# Patient Record
Sex: Female | Born: 1945
Health system: Southern US, Community
[De-identification: ages and names within clinical notes are randomized; demographics above are authoritative.]

## PROBLEM LIST (undated history)

## (undated) DIAGNOSIS — K5730 Diverticulosis of large intestine without perforation or abscess without bleeding: Secondary | ICD-10-CM

## (undated) DIAGNOSIS — C4490 Unspecified malignant neoplasm of skin, unspecified: Secondary | ICD-10-CM

## (undated) DIAGNOSIS — N959 Unspecified menopausal and perimenopausal disorder: Secondary | ICD-10-CM

## (undated) DIAGNOSIS — O2690 Pregnancy related conditions, unspecified, unspecified trimester: Secondary | ICD-10-CM

## (undated) DIAGNOSIS — K219 Gastro-esophageal reflux disease without esophagitis: Secondary | ICD-10-CM

## (undated) DIAGNOSIS — L404 Guttate psoriasis: Secondary | ICD-10-CM

## (undated) DIAGNOSIS — S82891D Other fracture of right lower leg, subsequent encounter for closed fracture with routine healing: Secondary | ICD-10-CM

## (undated) DIAGNOSIS — I10 Essential (primary) hypertension: Secondary | ICD-10-CM

## (undated) DIAGNOSIS — K624 Stenosis of anus and rectum: Secondary | ICD-10-CM

## (undated) DIAGNOSIS — E785 Hyperlipidemia, unspecified: Secondary | ICD-10-CM

## (undated) HISTORY — DX: Guttate psoriasis: L40.4

## (undated) HISTORY — DX: Stenosis of anus and rectum: K62.4

## (undated) HISTORY — DX: Hyperlipidemia, unspecified: E78.5

## (undated) HISTORY — DX: Diverticulosis of large intestine without perforation or abscess without bleeding: K57.30

## (undated) HISTORY — DX: Essential (primary) hypertension: I10

## (undated) HISTORY — DX: Pregnancy related conditions, unspecified, unspecified trimester: O26.90

## (undated) HISTORY — PX: RECTOCELE REPAIR: SHX761

## (undated) HISTORY — PX: HERNIA REPAIR: SHX51

## (undated) HISTORY — DX: Unspecified malignant neoplasm of skin, unspecified: C44.90

## (undated) HISTORY — DX: Unspecified menopausal and perimenopausal disorder: N95.9

## (undated) HISTORY — PX: ABDOMINAL HYSTERECTOMY: SHX81

## (undated) HISTORY — PX: CYSTOCELE REPAIR: SHX163

---
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
## 1967-01-04 HISTORY — PX: SEPTOPLASTY: SUR1290

## 1972-01-04 HISTORY — PX: VARICOSE VEIN SURGERY: SHX832

## 1989-01-03 HISTORY — PX: BLADDER REPAIR: SHX76

## 1999-12-13 ENCOUNTER — Other Ambulatory Visit: Admission: RE | Admit: 1999-12-13 | Discharge: 1999-12-13 | Payer: Self-pay | Admitting: Internal Medicine

## 2003-01-04 LAB — HM COLONOSCOPY: HM Colonoscopy: NORMAL

## 2003-05-12 ENCOUNTER — Ambulatory Visit (HOSPITAL_COMMUNITY): Admission: RE | Admit: 2003-05-12 | Discharge: 2003-05-12 | Payer: Self-pay | Admitting: Gastroenterology

## 2003-05-12 ENCOUNTER — Encounter (INDEPENDENT_AMBULATORY_CARE_PROVIDER_SITE_OTHER): Payer: Self-pay | Admitting: *Deleted

## 2004-06-22 ENCOUNTER — Ambulatory Visit: Payer: Self-pay | Admitting: Internal Medicine

## 2005-07-05 ENCOUNTER — Ambulatory Visit: Payer: Self-pay | Admitting: Internal Medicine

## 2006-07-11 ENCOUNTER — Ambulatory Visit: Payer: Self-pay | Admitting: Internal Medicine

## 2006-08-14 ENCOUNTER — Ambulatory Visit: Payer: Self-pay | Admitting: Gynecology

## 2007-10-18 ENCOUNTER — Encounter: Admission: RE | Admit: 2007-10-18 | Discharge: 2007-10-18 | Payer: Self-pay | Admitting: Internal Medicine

## 2007-12-04 ENCOUNTER — Ambulatory Visit: Payer: Self-pay | Admitting: Internal Medicine

## 2008-09-29 ENCOUNTER — Ambulatory Visit: Payer: Self-pay | Admitting: Internal Medicine

## 2009-01-08 ENCOUNTER — Ambulatory Visit: Payer: Self-pay | Admitting: Internal Medicine

## 2009-05-31 ENCOUNTER — Emergency Department: Payer: Self-pay | Admitting: Emergency Medicine

## 2009-10-12 ENCOUNTER — Ambulatory Visit: Payer: Self-pay | Admitting: Obstetrics and Gynecology

## 2009-10-15 ENCOUNTER — Ambulatory Visit (HOSPITAL_COMMUNITY): Admission: RE | Admit: 2009-10-15 | Discharge: 2009-10-15 | Payer: Self-pay | Admitting: Obstetrics & Gynecology

## 2009-11-05 ENCOUNTER — Ambulatory Visit: Payer: Self-pay | Admitting: Obstetrics & Gynecology

## 2010-01-14 ENCOUNTER — Ambulatory Visit: Payer: Self-pay | Admitting: Internal Medicine

## 2010-05-18 NOTE — Assessment & Plan Note (Signed)
Emily Ryan, Emily Ryan              ACCOUNT NO.:  [id number]   MEDICAL RECORD NO.:  [id number]          PATIENT TYPE:  POB   LOCATION:  [facility] at [facility]         FACILITY:  [facility]   PHYSICIAN:  Catalina Antigua, MD     DATE OF BIRTH:  12-11-1945   DATE OF SERVICE:  10/12/2009                                  CLINIC NOTE   HISTORY:  This is a 65 year old who presents today as a referral for  [facility] for evaluation of questionable swelling  around ovary.  The patient presents today without any complaints.  She  denies any pelvic pain and pressure, abnormal bleeding or discharge.  The patient is status post hysterectomy in 2002 secondary to uterine  prolapse and has been asymptomatic since.  The patient presents today as  mentioned before as a referral for evaluation of questionable pelvic  fullness appreciated on physical exam.   PAST MEDICAL HISTORY:  Significant for hypertension and hyperlipidemia.   PAST SURGICAL HISTORY:  She has had a hysterectomy, rectocele, and  cystocele repair in 2002 and 2000 respectively.  She had a hernia repair  in 1956 and tonsillectomy in 1953.   PHYSICAL EXAMINATION:  VITAL SIGNS:  Blood pressure 120/73, pulse of 81,  weight of 144 pounds, height of 4 feet 11 inches.  ABDOMEN:  Soft, nontender, and nondistended.  PELVIC:  She had a normal-appearing vaginal mucosa.  Vaginal cuff  intact.  No abnormal bleeding or discharge.  On bimanual exam, again  vaginal cuff was palpated to be intact and no adnexal or pelvic masses  were appreciated nor tenderness elicited.   ASSESSMENT AND PLAN:  This is a 65 year old postmenopausal who presents  today for evaluation of pelvic fullness appreciated on previous physical  exam.  Ultrasound was ordered for the patient reassurance.  The patient  will return in 2 weeks for discussion of the results.  Of note, her  family history is significant for coronary artery disease.  Breast and  ovarian cancer  in maternal and paternal aunts diagnosed over the age of  16.           ______________________________  Catalina Antigua, MD     PC/MEDQ  D:  10/12/2009  T:  10/13/2009  Job:  [id number]

## 2010-05-18 NOTE — Assessment & Plan Note (Signed)
Emily Ryan, Emily Ryan              ACCOUNT NO.:  [id number]   MEDICAL RECORD NO.:  [id number]          PATIENT TYPE:  POB   LOCATION:  [facility]         FACILITY:  [facility]   PHYSICIAN:  Elsie Lincoln, MD      DATE OF BIRTH:  1945/10/25   DATE OF SERVICE:  11/05/2009                                  CLINIC NOTE   The patient is a 65 year old female who presents for results of an  ultrasound.  The patient's primary care physician thought that she felt  ovarian fullness in the right adnexa.  Transvaginal ultrasound reveals a  right ovary with normal appearance of 1.4 x 0.7 x 1.3 cm.  The left  ovary is not visual.  The patient has had a hysterectomy for prolapse.  The patient is not having any complaints of fullness.  She does still  have urinary incontinence, but she does not wish anything to be done.  All primary care followup is done by her primary care physician,  followup mammogram.  The patient does not get Pap smears, but she states  she has never had abnormal Pap smear and she does have a hysterectomy.  The patient follows with yearly exams or she can followup with her  primary care physician.           ______________________________  Elsie Lincoln, MD     KL/MEDQ  D:  11/05/2009  T:  11/06/2009  Job:  [id number]

## 2010-05-21 NOTE — Op Note (Signed)
Emily Ryan, Emily Ryan                          ACCOUNT NO.:  [id number]   MEDICAL RECORD NO.:  [id number]                   PATIENT TYPE:  AMB   LOCATION:  ENDO                                 FACILITY:  [facility]   PHYSICIAN:  Anselmo Rod, M.D.               DATE OF BIRTH:  May 25, 1945   DATE OF PROCEDURE:  05/12/2003  DATE OF DISCHARGE:                                 OPERATIVE REPORT   PROCEDURE PERFORMED:  Colonoscopy with biopsies times two.   ENDOSCOPIST:  Charna Elizabeth, M.D.   INSTRUMENT USED:  Olympus video colonoscope.   INDICATIONS FOR PROCEDURE:  The patient is a 65 year old white female  undergoing screening colonoscopy.  The patient has had some rectal bleeding.  Rule out colonic polyps, masses, hemorrhoids, etc.   PREPROCEDURE PREPARATION:  Informed consent was procured from the patient.  The patient was fasted for eight hours prior to the procedure and prepped  with a bottle of magnesium citrate and a gallon of GoLYTELY the night prior  to the procedure.   PREPROCEDURE PHYSICAL:  The patient had stable vital signs.  Neck supple.  Chest clear to auscultation.  S1 and S2 regular.  Abdomen soft with normal  bowel sounds.   DESCRIPTION OF PROCEDURE:  The patient was placed in left lateral decubitus  position and sedated with 50 mg of Demerol and 5 mg of Versed intravenously.  Once the patient was adequately sedated and maintained on low flow oxygen  and continuous cardiac monitoring, the Olympus video colonoscope was  advanced from the rectum to the cecum.  The appendicular orifice and  ileocecal valve were clearly visualized and photographed.  A few sigmoid  diverticula were seen.  There were prominent external hemorrhoids, small  polyp was biopsied from the cecal base.  The terminal ileum appeared normal.   IMPRESSION:  1. Prominent external hemorrhoids.  2. Small polyp biopsied from the cecal base.  3. Few sigmoid diverticula.  4. Normal terminal ileum.   RECOMMENDATIONS:  1. Await pathology results.  2. Avoid all nonsteroidals including aspirin for now.  3. Repeat CRC screening depending on pathology results.  4. Outpatient followup in the next two weeks for further recommendations.                                               Anselmo Rod, M.D.    JNM/MEDQ  D:  05/12/2003  T:  05/12/2003  Job:  [id number]   cc:   Olene Craven, M.D.  [address]  [address]  [place]  [place] [address]  Fax: [phone]

## 2010-09-03 ENCOUNTER — Encounter: Payer: Self-pay | Admitting: Internal Medicine

## 2010-09-07 ENCOUNTER — Encounter: Payer: Self-pay | Admitting: Family Medicine

## 2010-09-07 ENCOUNTER — Ambulatory Visit (INDEPENDENT_AMBULATORY_CARE_PROVIDER_SITE_OTHER): Payer: Medicare Other | Admitting: Family Medicine

## 2010-09-07 DIAGNOSIS — N949 Unspecified condition associated with female genital organs and menstrual cycle: Secondary | ICD-10-CM

## 2010-09-07 DIAGNOSIS — E785 Hyperlipidemia, unspecified: Secondary | ICD-10-CM | POA: Insufficient documentation

## 2010-09-07 DIAGNOSIS — R102 Pelvic and perineal pain: Secondary | ICD-10-CM

## 2010-09-07 DIAGNOSIS — I10 Essential (primary) hypertension: Secondary | ICD-10-CM | POA: Insufficient documentation

## 2010-09-07 DIAGNOSIS — K5900 Constipation, unspecified: Secondary | ICD-10-CM

## 2010-09-07 DIAGNOSIS — E1169 Type 2 diabetes mellitus with other specified complication: Secondary | ICD-10-CM | POA: Insufficient documentation

## 2010-09-07 NOTE — Patient Instructions (Signed)
Pelvic Mass A "mass" is a lump that either your caregiver found during an examination or you found before seeing your caregiver. The "pelvis" is the lower portion of the trunk in between the hip bones. There are many possible reasons why a lump has appeared. Testing will help determine the cause and the steps to a solution. CAUSES Before complete testing is done, it may be difficult or impossible for your caregiver to know if the lump is truly in one of the pelvic organs (such as the uterus or ovaries) or is coming from one of many organs that are near the pelvis. Problems in the colon or kidney can also lead to a lump that might seem to be in the pelvis. If testing shows that the mass is in the pelvis, there are still many possible causes:  Tumors and cancers. These problems are relatively common and are the greatest source of worry for patients. Cancerous lumps in the pelvis may be due to cancers that started in the uterus or ovaries or due to cancers that started in other areas and then spread to the pelvis. Many cancers are very treatable when found early.   Non-cancerous tumors and masses. There are a large number of common and uncommon non-cancerous problems that can lead to a mass in the pelvis. Two very common ones are fibroids of the uterus and ovarian cysts. Before testing and/or surgery, it may be impossible to tell the difference between these problems and a cancer.   Infection. Certain types of infections can produce a mass in the pelvis. The infection might be caused by bacteria. If there is an infection treatment might include antibiotics. Masses from infection can also be caused by certain viruses, and in rare cases, by fungi or parasites. If infection is the cause, your caregiver will be able to determine the type of germ responsible for the mass by doing appropriate testing.   Inflammatory bowel disease. These are diseases thought to be caused by a defect in the immune system of the  intestine. There are two inflammatory bowel diseases: Ulcerative Colitis and Crohn's Disease. They are lifelong problems with symptoms that can come and go. Sometimes, patients with these diseases will develop a mass in the lower part of the colon that can make it seem as though there is a mass in the pelvis.   Past Surgery. If there has been pelvic surgery in the past, and there is a lot of scarring that forms during the process of healing, this can eventually fell like a mass when examined by your caregiver. As with the other problems described above, this may or may not be associated with symptoms or feeling badly.   Ectopic Pregnancy. This is a condition where the growing fetus is growing outside of the uterus. This is a common cause for a pelvic mass and may become a serious or life-threatening problem that requires immediate surgery.  SYMPTOMS In people with a pelvic mass, there may be a large variety of associated symptoms including:   No symptoms, other than the appearance of the mass itself.   Cramping, nausea, diarrhea.   Fever, vomiting, weakness.   Pelvic, side, and/or back pain.   Weight loss.   Constipation.   Problems with vaginal bleeding. This can be very variable. Bleeding might be very light or very heavy. Bleeding may be mixed with large clots. Menstruation may be very frequent and may seem to almost completely stop. There may be varying levels of pain with menstruation.  Urinary symptoms including frequent urination, inability to empty the bladder completely, or urinating very small amounts.  DIAGNOSIS Because of the large number of causes of a mass in the pelvis, your caregiver will ask you to undergo testing in order to get a clear diagnosis in a timely manner. The tests might include some or all of the following:  Blood tests such as a blood count, measurement of common minerals in the blood, kidney/liver/pancreas function, pregnancy test, and others.   X-rays.  Plain x-rays and special x-rays may be requested except if you are pregnant.   Ultrasound. This is a test that uses sound waves to "paint a picture" of the mass. The type of "sound picture" that is seen can help to narrow the diagnosis.   CAT scan and MRI imaging. Each can provide additional information as to the different characteristics of the mass and can help to develop a final plan for diagnostics and treatment. If cancer is suspected, these special tests can also help to show any spread of the cancer to other parts of the body. It is possible that these tests may not be ordered if you are pregnant.   Laparoscopy. This is a special exam of the inside of the pelvic area using a slim, flexible, lighted tube. This allows your caregiver to get a direct look at the mass. Sometimes, this allows getting a very small piece of the mass (a biopsy). This piece of tissue can then be examined in a lab that will frequently lead to a clear diagnosis. In some cases, your caregiver can use a laparoscope to completely remove the mass after it has been examined.   Surgery. Sometimes, a diagnosis can only be made by carrying out an operation and obtaining a biopsy (as noted above). Many times, the biopsy is obtained and the mass is removed during the same operation.  These are the most common ways for determining the exact cause of the mass. Your caregiver may recommend other tests that are not listed here. TREATMENT Treatment(s) can only be recommended after a diagnosis is made. Your caregiver will discuss your test results with you, the meanings of the tests, and the recommended steps to begin treatment. He/she will also recommend whether you need to be examined by specialists as you go through the steps of diagnosis and a treatment plan is developed. HOME CARE INSTRUCTIONS  Test preparation. Carefully follow instructions when preparing for certain tests. This may involve many things such as:   Drinking fluids to  fill the bladder before a pelvic ultrasound.   Fasting before certain blood tests.   Drinking special "contrast" fluids that are necessary for obtaining the best CAT scan and MRI images.   Medications. Your caregiver may prescribe medications to help relieve symptoms while you undergo testing. It is important that your current medications (prescription, non-prescription, herbal, vitamins, etc.) be kept in mind when new prescriptions are recommended.   Diet. There may be a need for changes in diet in order to help with symptom relief while testing is being done. If this applies to you, your caregiver will discuss these changes with you.  SEEK MEDICAL CARE IF:  You cannot hold down any of the recommended fluids used to prepare for tests such as CAT scan MRI.   You feel that you are having trouble with any new prescriptions.   You develop new symptoms of pain, vomiting, diarrhea, fever, or other problems that you did not feel since your last exam.  You experience inability to empty your bladder completely or develop painful and/or bloody urination.  SEEK IMMEDIATE MEDICAL CARE IF:  You vomit bright red blood, or a coffee ground appearing material.   You have blood in your stools, or the stools turn black and tarry.   You have an abnormal or increased amount of vaginal bleeding.   An oral temperature above 101 develops.   You develop easy bruising or bleeding.   You develop pain that is not controlled by your medication.   You feel worsening weakness or you have a fainting episode.   You feel that the mass has suddenly gotten larger.   You develop severe bloating in the abdomen and/or pelvis.   You cannot pass any urine.  MAKE SURE YOU:   Understand these instructions.   Will watch your condition.   Will get help right away if you are not doing well or get worse.  Document Released: 03/29/2006 Document Re-Released: 12/03/2007 [facility] Patient Information 2011 [place],  [place].

## 2010-09-07 NOTE — Progress Notes (Signed)
For about one week increased pressure in abdomen and unable to have a bowel movement.  She has had a cystocele and rectocele repair in Apri 2002 along with a hysterectomy approximately one year later.  She has been unable to have a bowel movement even though she has used enema which usually does the trick for her.

## 2010-09-07 NOTE — Progress Notes (Signed)
  Subjective:    Patient ID: Emily Ryan, female    DOB: 10-02-1945, 65 y.o.   MRN: [id number]  Abdominal Pain This is a new problem. The current episode started in the past 7 days. The onset quality is sudden. The problem occurs constantly. The problem has been gradually worsening. The pain is located in the suprapubic region. The quality of the pain is aching and cramping. The abdominal pain does not radiate. Associated symptoms include constipation. Pertinent negatives include no anorexia, fever, nausea, vomiting or weight loss. The pain is aggravated by certain positions and movement. The pain is relieved by being still and recumbency. She has tried nothing for the symptoms. The treatment provided moderate relief. Her past medical history is significant for abdominal surgery.      Review of Systems  Constitutional: Negative for fever and weight loss.  Gastrointestinal: Positive for abdominal pain and constipation. Negative for nausea, vomiting and anorexia.       Objective:   Physical Exam  Constitutional: She appears well-developed and well-nourished.  HENT:  Head: Normocephalic and atraumatic.  Neck: Normal range of motion.  Cardiovascular: Normal rate.   Pulmonary/Chest: Effort normal.  Abdominal: Soft.  Genitourinary:       NEFG, Excellent support, uterus and cervix are absent.  There is a mass felt at the top of the vaginal cuff that is mildly tender.          Assessment & Plan:  Pelvic mass/pain-unclear etiology, assoc. Constipation, failed supp, enema Will check pelvic u/s and try Mag. Citrate.

## 2010-09-08 ENCOUNTER — Encounter: Payer: Self-pay | Admitting: Internal Medicine

## 2010-09-08 ENCOUNTER — Ambulatory Visit (INDEPENDENT_AMBULATORY_CARE_PROVIDER_SITE_OTHER): Payer: Medicare Other | Admitting: Internal Medicine

## 2010-09-08 DIAGNOSIS — K5900 Constipation, unspecified: Secondary | ICD-10-CM

## 2010-09-08 DIAGNOSIS — E785 Hyperlipidemia, unspecified: Secondary | ICD-10-CM

## 2010-09-08 DIAGNOSIS — K2990 Gastroduodenitis, unspecified, without bleeding: Secondary | ICD-10-CM

## 2010-09-08 DIAGNOSIS — R0789 Other chest pain: Secondary | ICD-10-CM

## 2010-09-08 DIAGNOSIS — K2970 Gastritis, unspecified, without bleeding: Secondary | ICD-10-CM

## 2010-09-08 LAB — TSH: TSH: 1.04 u[IU]/mL (ref 0.35–5.50)

## 2010-09-08 LAB — LDL CHOLESTEROL, DIRECT: Direct LDL: 72 mg/dL

## 2010-09-08 NOTE — Progress Notes (Signed)
Subjective:    Patient ID: Emily Ryan, female    DOB: 03-29-1945, 65 y.o.   MRN: [id number]  HPI Presents with new onset abdominal heaviness/pain which started last Friday.   Pain is located in the upper epigastric area and associated with a feeling of fullness.  It is brought on with activity , lasted about an hour,  And resovled with rest.  She recently saw her GYN who ordered pelvic ultrasound for followup on an bnormal pelvic exam.  She drank a bottle of magnesium citrate yesterday thinking it was constipation.  She has not had the pain since yesterday.   Past Medical History  Diagnosis Date  . Hyperlipidemia   . Hypertension   . Menopausal disorder   . Complicated pregnancy     1st pregnancy complicated by post operative hemorrhage and 2ng complicated by epidural  . Diabetes mellitus    Current Outpatient Prescriptions on File Prior to Visit  Medication Sig Dispense Refill  . Calcium Carb-Cholecalciferol (CALCIUM PLUS VITAMIN D) 600-100 MG-UNIT CAPS Take by mouth. Two by mouth daily       . estrogens, conjugated, (PREMARIN) 0.45 MG tablet Take 0.45 mg by mouth daily. Take daily for 21 days then do not take for 7 days.       . hydrochlorothiazide (HYDRODIURIL) 12.5 MG tablet Take 12.5 mg by mouth daily.        Marland Kitchen lisinopril (PRINIVIL,ZESTRIL) 20 MG tablet Take 20 mg by mouth daily.        Marland Kitchen loratadine (CLARITIN) 10 MG tablet Take 10 mg by mouth 2 (two) times daily.        . Multiple Vitamin (MULTIVITAMIN) capsule Take 1 capsule by mouth daily.        . Omega-3 Fatty Acids (FISH OIL) 1000 MG CAPS Take by mouth 2 (two) times daily.        . simvastatin (ZOCOR) 40 MG tablet Take 40 mg by mouth daily.        . vitamin E 400 UNIT capsule Take 400 Units by mouth daily.           Review of Systems  Constitutional: Negative for fever, chills and unexpected weight change.  HENT: Negative for hearing loss, ear pain, nosebleeds, congestion, sore throat, facial swelling, rhinorrhea,  sneezing, mouth sores, trouble swallowing, neck pain, neck stiffness, voice change, postnasal drip, sinus pressure, tinnitus and ear discharge.   Eyes: Negative for pain, discharge, redness and visual disturbance.  Respiratory: Negative for cough, chest tightness, shortness of breath, wheezing and stridor.   Cardiovascular: Negative for chest pain, palpitations and leg swelling.  Gastrointestinal: Positive for abdominal pain and constipation.  Musculoskeletal: Negative for myalgias and arthralgias.  Skin: Negative for color change and rash.  Neurological: Negative for dizziness, weakness, light-headedness and headaches.  Hematological: Negative for adenopathy.    BP 131/77  Pulse 91  Temp(Src) 97.8 F (36.6 C) (Oral)  Resp 14  Ht 4' 11.5" (1.511 m)  Wt 139 lb (63.05 kg)  BMI 27.60 kg/m2  SpO2 96%     Objective:   Physical Exam  Constitutional: She is oriented to person, place, and time. She appears well-developed and well-nourished.  HENT:  Mouth/Throat: Oropharynx is clear and moist.  Eyes: EOM are normal. Pupils are equal, round, and reactive to light. No scleral icterus.  Neck: Normal range of motion. Neck supple. No JVD present. No thyromegaly present.  Cardiovascular: Normal rate, regular rhythm, normal heart sounds and intact distal pulses.   Pulmonary/Chest: Effort  normal and breath sounds normal.  Abdominal: Soft. Bowel sounds are normal. She exhibits no distension and no mass. There is no tenderness. There is no rebound.  Musculoskeletal: Normal range of motion. She exhibits no edema.  Lymphadenopathy:    She has no cervical adenopathy.  Neurological: She is alert and oriented to person, place, and time.  Skin: Skin is warm and dry.  Psychiatric: She has a normal mood and affect.          Assessment & Plan:  Abdominal pain:  Etiology unclear.  At time of visit , pain had not recurred after using a cathartic laxative. However at time of dictation, patient's  husband reported that she had another episode this morning of severe epigastric pain .  Ddx includes gastritis, PUD, biliary colic, pancreatitis (less likely given absence of risk factors) and atypical chest pain (CRFS include hypertension, hyperlipidemia and menopause).

## 2010-09-08 NOTE — Patient Instructions (Signed)
High-Fiber Diet A high-fiber diet changes your normal diet to include more whole grains, legumes, fruits, and vegetables. Changes in the diet involve replacing refined carbohydrates with unrefined foods. The calorie level of the diet is essentially unchanged. The Dietary Reference Intake (recommended amount) for adult males is 38 grams per day. For adult females, it is 25 grams per day. Pregnant and lactating women should consume 28 grams of fiber per day. Fiber is the intact part of a plant that is not broken down during digestion. Functional fiber is fiber that has been isolated from the plant to provide a beneficial effect in the body. PURPOSE  Increase stool bulk.   Ease and regulate bowel movements.   Lower cholesterol.  INDICATIONS THAT YOU NEED MORE FIBER  Constipation and hemorrhoids.   Uncomplicated diverticulosis (intestine condition) and irritable bowel syndrome.   Weight management.   As a protective measure against hardening of the arteries (atherosclerosis), diabetes, and cancer.  NOTE OF CAUTION If you have a digestive or bowel problem, ask your caregiver for advice before adding high-fiber foods to your diet. Some of the following medical problems are such that a high-fiber diet should not be used without consulting your caregiver. DO NOT USE WITH:  Acute diverticulitis (intestine infection).   Partial small bowel obstructions.   Complicated diverticular disease involving bleeding, rupture (perforation), or abscess (boil, furuncle).   Presence of autonomic neuropathy (nerve damage) or gastric paresis (stomach cannot empty itself).  GUIDELINES FOR INCREASING FIBER IN THE DIET  Start adding fiber to the diet slowly. A gradual increase of about 5 more grams (2 slices of whole-wheat bread, 2 servings of most fruits or vegetables, or 1 bowl of high-fiber cereal) per day is best. Too rapid an increase in fiber may result in constipation, flatulence, and bloating.   Drink  enough water and fluids to keep your urine clear or pale yellow. Water, juice, or caffeine-free drinks are recommended. Not drinking enough fluid may cause constipation.   Eat a variety of high-fiber foods rather than one type of fiber.   Try to increase your intake of fiber through using high-fiber foods rather than fiber pills or supplements that contain small amounts of fiber.   The goal is to change the types of food eaten. Do not supplement your present diet with high-fiber foods, but replace foods in your present diet.  INCLUDE A VARIETY OF FIBER SOURCES  Replace refined and processed grains with whole grains, canned fruits with fresh fruits, and incorporate other fiber sources. White rice, white breads, and most bakery goods contain little or no fiber.   Brown whole-grain rice, buckwheat oats, and many fruits and vegetables are all good sources of fiber. These include: broccoli, Brussels sprouts, cabbage, cauliflower, beets, sweet potatoes, white potatoes (skin on), carrots, tomatoes, eggplant, squash, berries, fresh fruits, and dried fruits.   Cereals appear to be the richest source of fiber. Cereal fiber is found in whole grains and bran. Bran is the fiber-rich outer coat of cereal grain, which is largely removed in refining. In whole-grain cereals, the bran remains. In breakfast cereals, the largest amount of fiber is found in those with "bran" in their names. The fiber content is sometimes indicated on the label.   You may need to include additional fruits and vegetables each day.   In baking, for 1 cup white flour, you may use the following substitutions:   1 cup whole-wheat flour minus 2 tablespoons.   1/2 cup white flour plus 1/2 cup   whole-wheat flour.  References: Dietary Reference Intakes: Recommended Intakes for Individuals. National Academy of Sciences. Institute of Medicine. Food and Nutrition Board. Document Released: 12/20/2004 Document Re-Released: 03/16/2009 [employer]  Patient Information 2011 [employer]. 

## 2010-09-09 ENCOUNTER — Telehealth: Payer: Self-pay | Admitting: Internal Medicine

## 2010-09-09 ENCOUNTER — Ambulatory Visit: Payer: Self-pay | Admitting: Internal Medicine

## 2010-09-09 DIAGNOSIS — K2970 Gastritis, unspecified, without bleeding: Secondary | ICD-10-CM | POA: Insufficient documentation

## 2010-09-09 MED ORDER — ESOMEPRAZOLE MAGNESIUM 40 MG PO CPDR: 40.0000 mg | DELAYED_RELEASE_CAPSULE | Freq: Every day | ORAL | Status: DC

## 2010-09-09 MED ORDER — HYOSCYAMINE SULFATE 0.125 MG SL SUBL: 0.1250 mg | SUBLINGUAL_TABLET | SUBLINGUAL | Status: AC | PRN

## 2010-09-09 MED ORDER — HYOSCYAMINE SULFATE 0.125 MG SL SUBL: 0.1250 mg | SUBLINGUAL_TABLET | SUBLINGUAL | Status: DC | PRN

## 2010-09-09 NOTE — Telephone Encounter (Signed)
Spoke with patient. She says that she is having a terrible pain. She described it as being really bad gas, or feeling as if she has to go to the bathroom, but can't. She is asking if you could add her on tomorrow because she doesn't want to go through weekened. Please advise.

## 2010-09-09 NOTE — Telephone Encounter (Signed)
Severe pain in pelvic area  Pain started last week.   Went to gyntuesday.  Dr Darrick Huntsman yesterday  Pt would like to be seen tomorrow

## 2010-09-09 NOTE — Telephone Encounter (Signed)
Patient has an appt with you tomorrow.   I scheduled her a KUB this afternoon and patient is aware.   Both Rxs have been called in and patient will pick those up as well, also asked her to stop taking the medications you identified in the message.

## 2010-09-09 NOTE — Telephone Encounter (Signed)
Sent you a note a little while ago re gastritis as cause for her pain .  Put her on schedule for tomorrow.  Have her get a KUB of the abdomen today at [facility]. Have her start the medications today that I printed out (we don't have a pharmacy on file) and to stop her vitmain c as wll as there aspirin and any ibuprofen or alleve prooducts.

## 2010-09-10 ENCOUNTER — Encounter: Payer: Self-pay | Admitting: Internal Medicine

## 2010-09-10 ENCOUNTER — Ambulatory Visit: Payer: Self-pay | Admitting: Internal Medicine

## 2010-09-10 ENCOUNTER — Ambulatory Visit (INDEPENDENT_AMBULATORY_CARE_PROVIDER_SITE_OTHER): Payer: Medicare Other | Admitting: Internal Medicine

## 2010-09-10 ENCOUNTER — Inpatient Hospital Stay: Payer: Self-pay | Admitting: Internal Medicine

## 2010-09-10 VITALS — BP 121/77 | HR 88 | Temp 99.1°F | Resp 16 | Wt 138.0 lb

## 2010-09-10 DIAGNOSIS — R1032 Left lower quadrant pain: Secondary | ICD-10-CM

## 2010-09-10 DIAGNOSIS — R109 Unspecified abdominal pain: Secondary | ICD-10-CM

## 2010-09-10 DIAGNOSIS — R1031 Right lower quadrant pain: Secondary | ICD-10-CM

## 2010-09-10 DIAGNOSIS — K5732 Diverticulitis of large intestine without perforation or abscess without bleeding: Secondary | ICD-10-CM

## 2010-09-10 LAB — CBC WITH DIFFERENTIAL/PLATELET
Basophils Relative: 0.2 % (ref 0.0–3.0)
Eosinophils Absolute: 0 10*3/uL (ref 0.0–0.7)
Eosinophils Relative: 0.1 % (ref 0.0–5.0)
Hemoglobin: 13.4 g/dL (ref 12.0–15.0)
Lymphocytes Relative: 9.9 % — ABNORMAL LOW (ref 12.0–46.0)
MCHC: 33.6 g/dL (ref 30.0–36.0)
Monocytes Relative: 9.9 % (ref 3.0–12.0)
Neutro Abs: 11.4 10*3/uL — ABNORMAL HIGH (ref 1.4–7.7)
Neutrophils Relative %: 79.9 % — ABNORMAL HIGH (ref 43.0–77.0)
RBC: 4.08 Mil/uL (ref 3.87–5.11)
WBC: 14.2 10*3/uL — ABNORMAL HIGH (ref 4.5–10.5)

## 2010-09-10 LAB — POCT URINALYSIS DIPSTICK
Bilirubin, UA: NEGATIVE
Glucose, UA: NEGATIVE
Leukocytes, UA: NEGATIVE
Nitrite, UA: NEGATIVE
Urobilinogen, UA: 0.2

## 2010-09-10 LAB — COMPREHENSIVE METABOLIC PANEL
AST: 19 U/L (ref 0–37)
Albumin: 3.5 g/dL (ref 3.5–5.2)
BUN: 10 mg/dL (ref 6–23)
CO2: 28 mEq/L (ref 19–32)
Calcium: 8.7 mg/dL (ref 8.4–10.5)
Chloride: 95 mEq/L — ABNORMAL LOW (ref 96–112)
GFR: 92.05 mL/min (ref >60.00)
Potassium: 3.9 mEq/L (ref 3.5–5.1)

## 2010-09-10 LAB — SEDIMENTATION RATE: Sed Rate: 80 mm/hr — ABNORMAL HIGH (ref 0–22)

## 2010-09-10 NOTE — Progress Notes (Signed)
Subjective:    Patient ID: Emily Ryan, female    DOB: 1945-10-25, 65 y.o.   MRN: [id number]  HPI Emily Ryan is a 65 yo white female with no significant PMH who presents with recurrent progressively worsening abdominal pain which started one week ago.  The pain initially began in her upper abdomen and was accompanied by abdominal distension and fullness. She saw her gynecologist who ordered a pelvic ultrasound which has not been done yet. She treated her self for presumed constipation with magnesium citrate 3 days prior to admission and the pain resolved for 24 hours after having a large stool.  For the last 3 days she has not had any subsequent bowel movements or flatus and the pain returned one day prior to admission but now involved both lower quadrants and her suprapubic area. She reports nausea without vomiting. Denies any fevers or chills. No recent travel, sick contacts or unusual ingestions.    Past Medical History  Diagnosis Date  . Hyperlipidemia   . Hypertension   . Menopausal disorder   . Complicated pregnancy     1st pregnancy complicated by post operative hemorrhage and 2ng complicated by epidural  . Diabetes mellitus    Current Outpatient Prescriptions on File Prior to Visit  Medication Sig Dispense Refill  . Calcium Carb-Cholecalciferol (CALCIUM PLUS VITAMIN D) 600-100 MG-UNIT CAPS Take by mouth. Two by mouth daily       . esomeprazole (NEXIUM) 40 MG capsule Take 1 capsule (40 mg total) by mouth daily.  30 capsule  3  . estrogens, conjugated, (PREMARIN) 0.45 MG tablet Take 0.45 mg by mouth daily. Take daily for 21 days then do not take for 7 days.       . hydrochlorothiazide (HYDRODIURIL) 12.5 MG tablet Take 12.5 mg by mouth daily.        . hyoscyamine (LEVSIN/SL) 0.125 MG SL tablet Place 1 tablet (0.125 mg total) under the tongue every 4 (four) hours as needed for cramping.  30 tablet  3  . lisinopril (PRINIVIL,ZESTRIL) 20 MG tablet Take 20 mg by mouth daily.        Marland Kitchen  loratadine (CLARITIN) 10 MG tablet Take 10 mg by mouth 2 (two) times daily.        . Multiple Vitamin (MULTIVITAMIN) capsule Take 1 capsule by mouth daily.        . Omega-3 Fatty Acids (FISH OIL) 1000 MG CAPS Take by mouth 2 (two) times daily.        . simvastatin (ZOCOR) 40 MG tablet Take 40 mg by mouth daily.        . vitamin E 400 UNIT capsule Take 400 Units by mouth daily.           Review of Systems  Constitutional: Negative.  Negative for fever, chills and unexpected weight change.  HENT: Negative.  Negative for hearing loss, ear pain, nosebleeds, congestion, sore throat, facial swelling, rhinorrhea, sneezing, mouth sores, trouble swallowing, neck pain, neck stiffness, voice change, postnasal drip, sinus pressure, tinnitus and ear discharge.   Eyes: Negative.  Negative for pain, discharge, redness and visual disturbance.  Respiratory: Negative for cough, chest tightness, shortness of breath, wheezing and stridor.   Cardiovascular: Negative for chest pain, palpitations and leg swelling.  Gastrointestinal: Positive for abdominal pain, constipation and abdominal distention. Negative for blood in stool.  Genitourinary: Positive for pelvic pain.  Musculoskeletal: Negative for myalgias and arthralgias.  Skin: Negative.  Negative for color change and rash.  Neurological: Negative.  Negative for dizziness, weakness, light-headedness and headaches.  Hematological: Negative for adenopathy.  Psychiatric/Behavioral: Negative.    BP 121/77  Pulse 88  Temp(Src) 99.1 F (37.3 C) (Oral)  Resp 16  Wt 138 lb (62.596 kg)  SpO2 95%     Objective:   Physical Exam  Constitutional: She is oriented to person, place, and time. She appears well-developed and well-nourished.  HENT:  Mouth/Throat: Oropharynx is clear and moist.  Eyes: EOM are normal. Pupils are equal, round, and reactive to light. No scleral icterus.  Neck: Normal range of motion. Neck supple. No JVD present. No thyromegaly present.    Cardiovascular: Normal rate, regular rhythm, normal heart sounds and intact distal pulses.   Pulmonary/Chest: Effort normal and breath sounds normal.  Abdominal: Soft. She exhibits distension. She exhibits no mass. Bowel sounds are decreased. There is tenderness. There is guarding. There is no rebound.  Musculoskeletal: Normal range of motion. She exhibits no edema.  Lymphadenopathy:    She has no cervical adenopathy.  Neurological: She is alert and oriented to person, place, and time.  Skin: Skin is warm and dry.  Psychiatric: She has a normal mood and affect.          Assessment & Plan:  1) Abdominal pain:  Secondary to sigmoid diverticulitis, with perforation suggested by small intraperitoneal fluid collection noted on urgent contrasted CT done today at [facility].  Will admit to Med/Surg unit,  Stat blood cultures followed by IV empiric abx with Cipro/Flagyl.  Bowel rest,  Morphine for pain,  Surgical consult.  The surgeon on call for unassigned patients is Dr. Tawanna Sat who will see patient tonight.

## 2010-09-13 ENCOUNTER — Other Ambulatory Visit: Payer: Self-pay | Admitting: Internal Medicine

## 2010-09-13 ENCOUNTER — Telehealth: Payer: Self-pay | Admitting: Family Medicine

## 2010-09-13 DIAGNOSIS — K5720 Diverticulitis of large intestine with perforation and abscess without bleeding: Secondary | ICD-10-CM

## 2010-09-13 MED ORDER — CIPROFLOXACIN HCL 500 MG PO TABS: 500.0000 mg | ORAL_TABLET | Freq: Two times a day (BID) | ORAL | Status: AC

## 2010-09-13 MED ORDER — POTASSIUM & SODIUM PHOSPHATES 278-164-250 MG PO PACK: 1.0000 | PACK | Freq: Three times a day (TID) | ORAL | Status: AC

## 2010-09-13 MED ORDER — METRONIDAZOLE 500 MG PO TABS: 500.0000 mg | ORAL_TABLET | Freq: Three times a day (TID) | ORAL | Status: AC

## 2010-09-13 NOTE — Telephone Encounter (Signed)
Patient just called to give Dr. Shawnie Pons an update.  She was recently seen and referred to have a follow up ultrasound.  Patient was recently discharged from the hospital with a perforated colon.  She will be having upcoming surgery for this.  In light of her new diagnosis, she does not plan to keep her follow up appointment or her ultrasound.  She will keep you posted on her progress.

## 2010-09-14 ENCOUNTER — Ambulatory Visit (HOSPITAL_COMMUNITY): Payer: Medicare Other

## 2010-09-17 ENCOUNTER — Other Ambulatory Visit: Payer: Medicare Other

## 2010-09-28 ENCOUNTER — Ambulatory Visit: Payer: Medicare Other | Admitting: Family Medicine

## 2010-10-13 ENCOUNTER — Ambulatory Visit: Payer: Medicare Other | Admitting: Internal Medicine

## 2010-10-25 ENCOUNTER — Encounter: Payer: Self-pay | Admitting: Internal Medicine

## 2010-11-04 HISTORY — PX: PARTIAL COLECTOMY: SHX5273

## 2010-11-08 ENCOUNTER — Ambulatory Visit (INDEPENDENT_AMBULATORY_CARE_PROVIDER_SITE_OTHER): Payer: Medicare Other | Admitting: Internal Medicine

## 2010-11-08 ENCOUNTER — Encounter: Payer: Self-pay | Admitting: Internal Medicine

## 2010-11-08 VITALS — BP 138/78 | HR 79 | Temp 98.2°F | Resp 14 | Ht 59.5 in | Wt 140.8 lb

## 2010-11-08 DIAGNOSIS — Z01818 Encounter for other preprocedural examination: Secondary | ICD-10-CM

## 2010-11-08 DIAGNOSIS — K5790 Diverticulosis of intestine, part unspecified, without perforation or abscess without bleeding: Secondary | ICD-10-CM

## 2010-11-08 DIAGNOSIS — I10 Essential (primary) hypertension: Secondary | ICD-10-CM

## 2010-11-08 DIAGNOSIS — K5730 Diverticulosis of large intestine without perforation or abscess without bleeding: Secondary | ICD-10-CM

## 2010-11-08 NOTE — Patient Instructions (Addendum)
Please the office of the anaesthesiologist and find out if they are ordering a chest x ray and an EKG.  If not,  I will be happy to order these. Please avoid nuts  during the holiday,  Just to be on the safe side.   Your blood pressure  Is fine,  Do not take your hctz or your other  blood pressure meds the morning of  The surgery. Check with the surgeon about taking meds the 3 days prior to surgery.

## 2010-11-08 NOTE — Progress Notes (Signed)
Subjective:    Patient ID: Emily Ryan, female    DOB: 04/05/1945, 65 y.o.   MRN: [id number]  HPI   Emily Ryan is a 65 yo white female who hospitalized recently for diverticulitis, with small perforation noted on CT scan.  She was admitted and treated with broad spectrum antibiotics for several days and her symptoms resolved without complications. She was seen by Dr. Anda Kraft, the surgical hospitalist for [facility],  because of the perforation and has decided to undergo elective surgery on Dec 4.  She is here for a preoperative evaluation.  She has a history of Diabetes Melliuts which is diet controlled and no history of CAD or renal disease.  She has no recent epsiodes of chest pain or shortness of breath and no prior adverse reactions to anesthesia.    Past Medical History  Diagnosis Date  . Hyperlipidemia   . Hypertension   . Menopausal disorder   . Complicated pregnancy     1st pregnancy complicated by post operative hemorrhage and 2ng complicated by epidural  . Diabetes mellitus    Current Outpatient Prescriptions on File Prior to Visit  Medication Sig Dispense Refill  . Calcium Carb-Cholecalciferol (CALCIUM PLUS VITAMIN D) 600-100 MG-UNIT CAPS Take by mouth. Two by mouth daily       . estrogens, conjugated, (PREMARIN) 0.45 MG tablet Take 0.45 mg by mouth daily. Take daily for 21 days then do not take for 7 days.       . hydrochlorothiazide (HYDRODIURIL) 12.5 MG tablet Take 12.5 mg by mouth daily.        Marland Kitchen lisinopril (PRINIVIL,ZESTRIL) 20 MG tablet Take 20 mg by mouth daily.        Marland Kitchen loratadine (CLARITIN) 10 MG tablet Take 10 mg by mouth 2 (two) times daily.        . Multiple Vitamin (MULTIVITAMIN) capsule Take 1 capsule by mouth daily.        . Omega-3 Fatty Acids (FISH OIL) 1000 MG CAPS Take by mouth 2 (two) times daily.        . simvastatin (ZOCOR) 40 MG tablet Take 40 mg by mouth daily.        . vitamin E 400 UNIT capsule Take 400 Units by mouth daily.          Review of Systems    Constitutional: Negative for fever, chills and unexpected weight change.  HENT: Negative for hearing loss, ear pain, nosebleeds, congestion, sore throat, facial swelling, rhinorrhea, sneezing, mouth sores, trouble swallowing, neck pain, neck stiffness, voice change, postnasal drip, sinus pressure, tinnitus and ear discharge.   Eyes: Negative for pain, discharge, redness and visual disturbance.  Respiratory: Negative for cough, chest tightness, shortness of breath, wheezing and stridor.   Cardiovascular: Negative for chest pain, palpitations and leg swelling.  Musculoskeletal: Negative for myalgias and arthralgias.  Skin: Negative for color change and rash.  Neurological: Negative for dizziness, weakness, light-headedness and headaches.  Hematological: Negative for adenopathy.       Objective:   Physical Exam  Constitutional: She is oriented to person, place, and time. She appears well-developed and well-nourished.  HENT:  Mouth/Throat: Oropharynx is clear and moist.  Eyes: EOM are normal. Pupils are equal, round, and reactive to light. No scleral icterus.  Neck: Normal range of motion. Neck supple. No JVD present. No thyromegaly present.  Cardiovascular: Normal rate, regular rhythm, normal heart sounds and intact distal pulses.   Pulmonary/Chest: Effort normal and breath sounds normal.  Abdominal: Soft. Bowel sounds  are normal. She exhibits no mass. There is no tenderness.  Musculoskeletal: Normal range of motion. She exhibits no edema.  Lymphadenopathy:    She has no cervical adenopathy.  Neurological: She is alert and oriented to person, place, and time.  Skin: Skin is warm and dry.  Psychiatric: She has a normal mood and affect.          Assessment & Plan:  Preoperative evaluation:  She has no contraindications to surgery, but will need to have an EKG and CXR prior to surgery. I have asked her to find out if the anasthesiologist is ordering these to avoid duplication.

## 2010-11-10 ENCOUNTER — Encounter: Payer: Self-pay | Admitting: Internal Medicine

## 2010-11-10 DIAGNOSIS — K5730 Diverticulosis of large intestine without perforation or abscess without bleeding: Secondary | ICD-10-CM | POA: Insufficient documentation

## 2010-11-10 NOTE — Assessment & Plan Note (Signed)
With recent hospitalization for diverticulitis with small perforation which resolved with bowel rest and empiric antibiotics. Advised to avoid nuts over the holidays pending surgery planned for Dec 4

## 2010-11-11 ENCOUNTER — Telehealth: Payer: Self-pay | Admitting: Internal Medicine

## 2010-11-11 DIAGNOSIS — Z01818 Encounter for other preprocedural examination: Secondary | ICD-10-CM

## 2010-11-11 NOTE — Telephone Encounter (Signed)
Patient called and stated her surgery is Dec. 4th with Dr. Anda Kraft.  She said you wanted her to check with him to see if he was going to order an EKG and a chest x ray.  He told her that if you wanted her to have one than you could order it and fax him the results.  She also wanted to know if she could come in for a Tdap.  Please advise

## 2010-11-12 NOTE — Telephone Encounter (Signed)
Yes she can dome in for dtap. Let her know medicare doesn't pay only supplemental does.  You can do the ekg when she comes in .  The CXR should be done at [facility] so he has access to it.

## 2010-11-12 NOTE — Telephone Encounter (Signed)
Patient notified. Appt for tdap and ekg scheduled. Order for chest xray entered in chart. Patient will pick up order when she comes in for nurse visit.

## 2010-11-16 ENCOUNTER — Ambulatory Visit (INDEPENDENT_AMBULATORY_CARE_PROVIDER_SITE_OTHER): Payer: Medicare Other | Admitting: *Deleted

## 2010-11-16 DIAGNOSIS — Z23 Encounter for immunization: Secondary | ICD-10-CM

## 2010-11-16 DIAGNOSIS — Z136 Encounter for screening for cardiovascular disorders: Secondary | ICD-10-CM

## 2010-11-23 ENCOUNTER — Ambulatory Visit: Payer: Self-pay | Admitting: Internal Medicine

## 2010-11-26 ENCOUNTER — Telehealth: Payer: Self-pay | Admitting: Internal Medicine

## 2010-11-26 NOTE — Telephone Encounter (Signed)
Left message notifying patient.

## 2010-11-26 NOTE — Telephone Encounter (Signed)
preoperative chest x ray was normal.

## 2010-11-29 ENCOUNTER — Ambulatory Visit: Payer: Self-pay | Admitting: Surgery

## 2010-12-04 HISTORY — PX: APPENDECTOMY: SHX54

## 2010-12-07 ENCOUNTER — Inpatient Hospital Stay: Payer: Self-pay | Admitting: Surgery

## 2010-12-08 ENCOUNTER — Telehealth: Payer: Self-pay | Admitting: Internal Medicine

## 2010-12-09 ENCOUNTER — Ambulatory Visit: Payer: Medicare Other | Admitting: Internal Medicine

## 2010-12-09 ENCOUNTER — Encounter: Payer: Self-pay | Admitting: Internal Medicine

## 2010-12-10 ENCOUNTER — Telehealth: Payer: Self-pay | Admitting: Internal Medicine

## 2010-12-10 NOTE — Telephone Encounter (Signed)
Patient called and wanted to let you know she is in the hospital at [facility] because she just had colon surgery.  She stated she will be in there for a few more days.

## 2010-12-10 NOTE — Telephone Encounter (Signed)
I called her .  She is doing very well post op .

## 2011-01-18 ENCOUNTER — Ambulatory Visit (INDEPENDENT_AMBULATORY_CARE_PROVIDER_SITE_OTHER): Payer: Medicare Other | Admitting: Internal Medicine

## 2011-01-18 ENCOUNTER — Encounter: Payer: Self-pay | Admitting: Internal Medicine

## 2011-01-18 VITALS — BP 126/80 | HR 82 | Temp 98.0°F | Wt 139.0 lb

## 2011-01-18 DIAGNOSIS — K5900 Constipation, unspecified: Secondary | ICD-10-CM

## 2011-01-18 DIAGNOSIS — Z1239 Encounter for other screening for malignant neoplasm of breast: Secondary | ICD-10-CM

## 2011-01-18 DIAGNOSIS — K2100 Gastro-esophageal reflux disease with esophagitis, without bleeding: Secondary | ICD-10-CM

## 2011-01-18 DIAGNOSIS — K5730 Diverticulosis of large intestine without perforation or abscess without bleeding: Secondary | ICD-10-CM

## 2011-01-18 MED ORDER — ESOMEPRAZOLE MAGNESIUM 40 MG PO CPDR: 40.0000 mg | DELAYED_RELEASE_CAPSULE | Freq: Every day | ORAL | Status: DC

## 2011-01-18 MED ORDER — LACTULOSE 20 GM/30ML PO SOLN: 30.0000 mL | Freq: Four times a day (QID) | ORAL | Status: DC | PRN

## 2011-01-18 NOTE — Progress Notes (Signed)
  Subjective:    Patient ID: Emily Ryan, female    DOB: 05-Mar-1945, 66 y.o.   MRN: [id number]  HPI   6 wk hospital follow up after having a partial colectomy for for diverticular perforation.  She is doing very well.  Denies pain except for a minor pulling sensationa the bottom of her midline vertical transition that ends at her pubic bone.  She has  Been very consceitnious about her bowels since then and has been using lactulose frequently , almost daily,  To prevent constipation.  She has not engaged in any exercise or intercourse yet and wants to know when she can return to those activities.  Finally she has developed recurrent reflux, and has been taking using tumsevery night because she did not get the nexium prescription filled several months ago.     Review of Systems  Constitutional: Negative for fever, chills, appetite change, fatigue and unexpected weight change.  HENT: Negative for ear pain, congestion, sore throat, trouble swallowing, neck pain, voice change and sinus pressure.   Eyes: Negative for visual disturbance.  Respiratory: Negative for cough, shortness of breath, wheezing and stridor.   Cardiovascular: Negative for chest pain, palpitations and leg swelling.  Gastrointestinal: Positive for constipation. Negative for nausea, vomiting, abdominal pain, diarrhea, blood in stool, abdominal distention and anal bleeding.  Genitourinary: Negative for dysuria and flank pain.  Musculoskeletal: Negative for myalgias, arthralgias and gait problem.  Skin: Negative for color change and rash.  Neurological: Negative for dizziness and headaches.  Hematological: Negative for adenopathy. Does not bruise/bleed easily.  Psychiatric/Behavioral: Negative for suicidal ideas, sleep disturbance and dysphoric mood. The patient is not nervous/anxious.        Objective:   Physical Exam  Constitutional: She is oriented to person, place, and time. She appears well-developed and well-nourished.    HENT:  Mouth/Throat: Oropharynx is clear and moist.  Eyes: EOM are normal. Pupils are equal, round, and reactive to light. No scleral icterus.  Neck: Normal range of motion. Neck supple. No JVD present. No thyromegaly present.  Cardiovascular: Normal rate, regular rhythm, normal heart sounds and intact distal pulses.   Pulmonary/Chest: Effort normal and breath sounds normal.  Abdominal: Soft. Bowel sounds are normal. She exhibits no mass. There is no tenderness.         Midline incision has slight puckering at the distal end   Musculoskeletal: Normal range of motion. She exhibits no edema.  Lymphadenopathy:    She has no cervical adenopathy.  Neurological: She is alert and oriented to person, place, and time.  Skin: Skin is warm and dry.  Psychiatric: She has a normal mood and affect.          Assessment & Plan:

## 2011-01-18 NOTE — Patient Instructions (Addendum)
Try using stool softener and Fiber con daily and save the alctulsoe ofr once or twice weekly if needed   Ok to resume intercourse (gentle please!) and gym activities but would defer abdomninal crunches and leg lefts for a few more weeks   Resume Nexium on a daily basis for reflux

## 2011-01-19 ENCOUNTER — Encounter: Payer: Self-pay | Admitting: Internal Medicine

## 2011-01-19 DIAGNOSIS — K2100 Gastro-esophageal reflux disease with esophagitis, without bleeding: Secondary | ICD-10-CM | POA: Insufficient documentation

## 2011-01-19 DIAGNOSIS — K5730 Diverticulosis of large intestine without perforation or abscess without bleeding: Secondary | ICD-10-CM | POA: Insufficient documentation

## 2011-01-19 DIAGNOSIS — K5900 Constipation, unspecified: Secondary | ICD-10-CM | POA: Insufficient documentation

## 2011-01-19 NOTE — Assessment & Plan Note (Signed)
With admission in Oct for peritonitis secondary to perforation.  Shr is now s/pp artial colectomy.

## 2011-01-19 NOTE — Assessment & Plan Note (Signed)
Advised to begin nexium daily, avoid eating late at night,  Elevate head of bead by 2 inches.  If no improvement will refer to GI for endoscopy

## 2011-01-19 NOTE — Assessment & Plan Note (Signed)
Recommended daily use of stool softener and fibercon  Or other fiber ill to avoid daily use of lactulose.

## 2011-01-20 ENCOUNTER — Other Ambulatory Visit: Payer: Self-pay | Admitting: Internal Medicine

## 2011-01-28 ENCOUNTER — Other Ambulatory Visit: Payer: Self-pay | Admitting: *Deleted

## 2011-01-28 MED ORDER — ESOMEPRAZOLE MAGNESIUM 40 MG PO CPDR: 40.0000 mg | DELAYED_RELEASE_CAPSULE | Freq: Every day | ORAL | Status: DC

## 2011-01-28 NOTE — Telephone Encounter (Signed)
Can you please print this for me

## 2011-02-23 DIAGNOSIS — H40009 Preglaucoma, unspecified, unspecified eye: Secondary | ICD-10-CM | POA: Diagnosis not present

## 2011-03-08 ENCOUNTER — Encounter: Payer: Self-pay | Admitting: Internal Medicine

## 2011-03-10 ENCOUNTER — Ambulatory Visit: Payer: Self-pay | Admitting: Internal Medicine

## 2011-03-10 DIAGNOSIS — R928 Other abnormal and inconclusive findings on diagnostic imaging of breast: Secondary | ICD-10-CM | POA: Diagnosis not present

## 2011-03-10 DIAGNOSIS — Z1231 Encounter for screening mammogram for malignant neoplasm of breast: Secondary | ICD-10-CM | POA: Diagnosis not present

## 2011-03-14 ENCOUNTER — Telehealth: Payer: Self-pay | Admitting: Internal Medicine

## 2011-03-14 NOTE — Telephone Encounter (Signed)
Left message asking patient to return my call.

## 2011-03-14 NOTE — Telephone Encounter (Signed)
Patient notified.  She stated Delford Field has contacted her and she will repeat the films on March 19th.

## 2011-03-14 NOTE — Telephone Encounter (Signed)
Emily Ryan's mammogram was abnormal last week. On the left. They need her to go back for additional films.  Have they contacted her yet?  If not, please write an order on [facility] Radiology sheet for diagnostic views of left breast and ultrasoudn and I will sign, thanks

## 2011-03-19 DIAGNOSIS — J020 Streptococcal pharyngitis: Secondary | ICD-10-CM | POA: Diagnosis not present

## 2011-03-19 DIAGNOSIS — R509 Fever, unspecified: Secondary | ICD-10-CM | POA: Diagnosis not present

## 2011-03-22 ENCOUNTER — Ambulatory Visit: Payer: Self-pay | Admitting: Internal Medicine

## 2011-03-22 DIAGNOSIS — N6459 Other signs and symptoms in breast: Secondary | ICD-10-CM | POA: Diagnosis not present

## 2011-03-22 DIAGNOSIS — R928 Other abnormal and inconclusive findings on diagnostic imaging of breast: Secondary | ICD-10-CM | POA: Diagnosis not present

## 2011-03-24 ENCOUNTER — Telehealth: Payer: Self-pay | Admitting: Internal Medicine

## 2011-03-24 NOTE — Telephone Encounter (Signed)
I got the final report on the additional images of her left breast .  They were normal.

## 2011-03-25 ENCOUNTER — Encounter: Payer: Self-pay | Admitting: Internal Medicine

## 2011-03-25 NOTE — Telephone Encounter (Signed)
Patient notified

## 2011-04-04 ENCOUNTER — Ambulatory Visit (INDEPENDENT_AMBULATORY_CARE_PROVIDER_SITE_OTHER): Payer: Medicare Other | Admitting: Internal Medicine

## 2011-04-04 ENCOUNTER — Encounter: Payer: Self-pay | Admitting: Internal Medicine

## 2011-04-04 VITALS — BP 106/60 | HR 100 | Temp 98.4°F | Resp 16 | Wt 139.2 lb

## 2011-04-04 DIAGNOSIS — L408 Other psoriasis: Secondary | ICD-10-CM

## 2011-04-04 DIAGNOSIS — L404 Guttate psoriasis: Secondary | ICD-10-CM

## 2011-04-04 MED ORDER — PREDNISONE (PAK) 10 MG PO TABS: ORAL_TABLET | ORAL | Status: DC

## 2011-04-04 MED ORDER — TRIAMCINOLONE ACETONIDE 0.1 % EX LOTN: TOPICAL_LOTION | Freq: Three times a day (TID) | CUTANEOUS | Status: DC

## 2011-04-04 MED ORDER — METHYLPREDNISOLONE ACETATE 40 MG/ML IJ SUSP
40.0000 mg | Freq: Once | INTRAMUSCULAR | Status: AC
  Administered 2011-04-04: 40 mg via INTRAMUSCULAR

## 2011-04-04 NOTE — Progress Notes (Signed)
Patient ID: Emily Ryan, female   DOB: August 08, 1945, 66 y.o.   MRN: [id number]  Patient Active Problem List  Diagnoses  . Hypertension  . Hyperlipidemia  . Gastritis  . Diverticulosis of sigmoid colon  . Constipation  . Reflux esophagitis  . Guttate psoriasis    Subjective:  CC:   Chief Complaint  Patient presents with  . Rash    HPI:   Emily Abbruzziis a 66 y.o. female who presents with a 5 day history of psoriatic rash occurring all over her body.  The rash occurred after an episode of strep pharyngitis two weeks ago.  The rash is papular, pruritic and similar to pror occurrencdes of psoriasis which her dermatologist treated with clobetasone ointment, triamcinolone cream, and tacrolimus .  She is requesting triamcinolone cream, but her rash covers her torso , back arms and legs. Denies fevers.   S[ent some time in the sun this weekend which has helped in the past.    Past Medical History  Diagnosis Date  . Hyperlipidemia   . Hypertension   . Menopausal disorder   . Complicated pregnancy     1st pregnancy complicated by post operative hemorrhage and 2ng complicated by epidural  . Diabetes mellitus   . Diverticulosis of colon (without mention of hemorrhage)     Past Surgical History  Procedure Date  . Hernia repair   . Varicose vein surgery 1974    right leg   . Tonsillectomy 1953  . Septoplasty 1969  . Abdominal hysterectomy   . Bladder repair 1991    lift   . Rectocele repair   . Cystocele repair   . Partial colectomy Nov 2012    left, secondary to diverticular perf       . methylPREDNISolone acetate  40 mg Intramuscular Once     The following portions of the patient's history were reviewed and updated as appropriate: Allergies, current medications, and problem list.    Review of Systems:   12 Pt  review of systems was negative except those addressed in the HPI,     History   Social History  . Marital Status: Married    Spouse Name: N/A   Number of Children: N/A  . Years of Education: N/A   Occupational History  . Not on file.   Social History Main Topics  . Smoking status: Never Smoker   . Smokeless tobacco: Never Used  . Alcohol Use: Yes     Occasional  . Drug Use: No  . Sexually Active: Not on file   Other Topics Concern  . Not on file   Social History Narrative  . No narrative on file    Objective:  BP 106/60  Pulse 100  Temp(Src) 98.4 F (36.9 C) (Oral)  Resp 16  Wt 139 lb 4 oz (63.163 kg)  SpO2 94%  General appearance: alert, cooperative and appears stated age Ears: normal TM's and external ear canals both ears Throat: lips, mucosa, and tongue normal; teeth and gums normal Neck: no adenopathy, no carotid bruit, supple, symmetrical, trachea midline and thyroid not enlarged, symmetric, no tenderness/mass/nodules Back: symmetric, no curvature. ROM normal. No CVA tenderness. Lungs: clear to auscultation bilaterally Heart: regular rate and rhythm, S1, S2 normal, no murmur, click, rub or gallop Abdomen: soft, non-tender; bowel sounds normal; no masses,  no organomegaly Pulses: 2+ and symmetric Skin: diffuse papular erythematous rash covering torso, arms and legs Lymph nodes: Cervical, supraclavicular, and axillary nodes normal.  Assessment and Plan:  Guttate  psoriasis Diffuse,  Reaction to recent Strep pharyngitis treated 2 weeks ago.  depomedrol IM injection given.,  followed by 6 day prednisone taper.  Prn triamcinolone to persistnet lesions.     Updated Medication List Outpatient Encounter Prescriptions as of 04/04/2011  Medication Sig Dispense Refill  . Calcium Carb-Cholecalciferol (CALCIUM PLUS VITAMIN D) 600-100 MG-UNIT CAPS Take by mouth. Two by mouth daily       . esomeprazole (NEXIUM) 40 MG capsule Take 1 capsule (40 mg total) by mouth daily.  90 capsule  3  . estrogens, conjugated, (PREMARIN) 0.45 MG tablet Take 0.45 mg by mouth daily. Take daily for 21 days then do not take for 7 days.        . hydrochlorothiazide (HYDRODIURIL) 12.5 MG tablet Take 12.5 mg by mouth daily.        . Lactulose 20 GM/30ML SOLN Take 30 mLs (20 g total) by mouth every 6 (six) hours as needed.  500 mL  1  . lisinopril (PRINIVIL,ZESTRIL) 20 MG tablet Take 20 mg by mouth daily.        Marland Kitchen loratadine (CLARITIN) 10 MG tablet Take 10 mg by mouth 2 (two) times daily.        . Multiple Vitamin (MULTIVITAMIN) capsule Take 1 capsule by mouth daily.        . Omega-3 Fatty Acids (FISH OIL) 1000 MG CAPS Take by mouth 2 (two) times daily.        . simvastatin (ZOCOR) 40 MG tablet Take 40 mg by mouth daily.        . vitamin E 400 UNIT capsule Take 400 Units by mouth daily.        . predniSONE (STERAPRED UNI-PAK) 10 MG tablet 6 tablets on Day 1 , then reduce by 1 tablet daily until gone  21 tablet  0  . triamcinolone lotion (KENALOG) 0.1 % Apply topically 3 (three) times daily.  60 mL  0   Facility-Administered Encounter Medications as of 04/04/2011  Medication Dose Route Frequency Provider Last Rate Last Dose  . methylPREDNISolone acetate (DEPO-MEDROL) injection 40 mg  40 mg Intramuscular Once Sherlene Shams, MD   40 mg at 04/04/11 1715     No orders of the defined types were placed in this encounter.    No Follow-up on file.

## 2011-04-04 NOTE — Patient Instructions (Signed)
prednisone taper to decrease the inflammation,  Start tomorrow   Triamcinolone cream to leg bumps.  Tell Mr. Giannattasio to choose etodolac or nabumetone as the antiinflammatroy;  Adding tramadol as a pure pain reliever up to 4 daily

## 2011-04-04 NOTE — Assessment & Plan Note (Signed)
Diffuse,  Reaction to recent Strep pharyngitis treated 2 weeks ago.  depomedrol IM injection given.,  followed by 6 day prednisone taper.  Prn triamcinolone to persistnet lesions.

## 2011-04-06 ENCOUNTER — Encounter: Payer: Self-pay | Admitting: Internal Medicine

## 2011-04-11 ENCOUNTER — Encounter: Payer: Self-pay | Admitting: Internal Medicine

## 2011-04-11 ENCOUNTER — Ambulatory Visit (INDEPENDENT_AMBULATORY_CARE_PROVIDER_SITE_OTHER): Payer: Medicare Other | Admitting: Internal Medicine

## 2011-04-11 VITALS — BP 122/78 | HR 71 | Temp 97.9°F | Resp 16 | Wt 138.5 lb

## 2011-04-11 DIAGNOSIS — L404 Guttate psoriasis: Secondary | ICD-10-CM

## 2011-04-11 DIAGNOSIS — L408 Other psoriasis: Secondary | ICD-10-CM | POA: Diagnosis not present

## 2011-04-11 MED ORDER — PREDNISONE 10 MG PO TABS: ORAL_TABLET | ORAL | Status: DC

## 2011-04-11 MED ORDER — HALCINONIDE 0.1 % EX CREA: TOPICAL_CREAM | CUTANEOUS | Status: DC

## 2011-04-11 NOTE — Assessment & Plan Note (Signed)
Will continue 10 mg daily dose for one week, then taper off.  halog cream added for large patch not responding to triamcinolone.  Has scheduled appt May 10 with dermatologist; suggested she keep it.

## 2011-04-11 NOTE — Progress Notes (Signed)
Patient ID: Emily Ryan, female   DOB: 05-Nov-1945, 66 y.o.   MRN: [id number]  Patient Active Problem List  Diagnoses  . Hypertension  . Hyperlipidemia  . Gastritis  . Diverticulosis of sigmoid colon  . Constipation  . Reflux esophagitis  . Guttate psoriasis    Subjective:  CC:   Chief Complaint  Patient presents with  . Follow-up    HPI:   Emily Abbruzziis a 66 y.o. female who presents for follow up om guttate psoriasis secondary strep pharyngitis, treated with steroid taper.  Finished 6 day taper yesterday,  Rash improved but not resolved. Still itching,  Has large patch on right buttock.   Past Medical History  Diagnosis Date  . Hyperlipidemia   . Hypertension   . Menopausal disorder   . Complicated pregnancy     1st pregnancy complicated by post operative hemorrhage and 2ng complicated by epidural  . Diabetes mellitus   . Diverticulosis of colon (without mention of hemorrhage)     Past Surgical History  Procedure Date  . Hernia repair   . Varicose vein surgery 1974    right leg   . Tonsillectomy 1953  . Septoplasty 1969  . Abdominal hysterectomy   . Bladder repair 1991    lift   . Rectocele repair   . Cystocele repair   . Partial colectomy Nov 2012    left, secondary to diverticular perf         The following portions of the patient's history were reviewed and updated as appropriate: Allergies, current medications, and problem list.    Review of Systems:   12 Pt  review of systems was negative except those addressed in the HPI,     History   Social History  . Marital Status: Married    Spouse Name: N/A    Number of Children: N/A  . Years of Education: N/A   Occupational History  . Not on file.   Social History Main Topics  . Smoking status: Never Smoker   . Smokeless tobacco: Never Used  . Alcohol Use: Yes     Occasional  . Drug Use: No  . Sexually Active: Not on file   Other Topics Concern  . Not on file   Social History  Narrative  . No narrative on file    Objective:  BP 122/78  Pulse 71  Temp(Src) 97.9 F (36.6 C) (Oral)  Resp 16  Wt 138 lb 8 oz (62.823 kg)  SpO2 96%  General appearance: alert, cooperative and appears stated age Ears: normal TM's and external ear canals both ears Throat: lips, mucosa, and tongue normal; teeth and gums normal Neck: no adenopathy, no carotid bruit, supple, symmetrical, trachea midline and thyroid not enlarged, symmetric, no tenderness/mass/nodules Back: symmetric, no curvature. ROM normal. No CVA tenderness. Lungs: clear to auscultation bilaterally Heart: regular rate and rhythm, S1, S2 normal, no murmur, click, rub or gallop Abdomen: soft, non-tender; bowel sounds normal; no masses,  no organomegaly Pulses: 2+ and symmetric Skin: Skin color, texture, turgor normal. No rashes or lesions Lymph nodes: Cervical, supraclavicular, and axillary nodes normal.  Assessment and Plan:  Guttate psoriasis Will continue 10 mg daily dose for one week, then taper off.  halog cream added for large patch not responding to triamcinolone.  Has scheduled appt May 10 with dermatologist; suggested she keep it.     Updated Medication List Outpatient Encounter Prescriptions as of 04/11/2011  Medication Sig Dispense Refill  . Calcium Carb-Cholecalciferol (CALCIUM PLUS VITAMIN  D) 600-100 MG-UNIT CAPS Take by mouth. Two by mouth daily       . esomeprazole (NEXIUM) 40 MG capsule Take 1 capsule (40 mg total) by mouth daily.  90 capsule  3  . estrogens, conjugated, (PREMARIN) 0.45 MG tablet Take 0.45 mg by mouth daily. Take daily for 21 days then do not take for 7 days.       . hydrochlorothiazide (HYDRODIURIL) 12.5 MG tablet Take 12.5 mg by mouth daily.        . Lactulose 20 GM/30ML SOLN Take 30 mLs (20 g total) by mouth every 6 (six) hours as needed.  500 mL  1  . lisinopril (PRINIVIL,ZESTRIL) 20 MG tablet Take 20 mg by mouth daily.        Marland Kitchen loratadine (CLARITIN) 10 MG tablet Take 10 mg  by mouth 2 (two) times daily.        . Multiple Vitamin (MULTIVITAMIN) capsule Take 1 capsule by mouth daily.        . Omega-3 Fatty Acids (FISH OIL) 1000 MG CAPS Take by mouth 2 (two) times daily.        . simvastatin (ZOCOR) 40 MG tablet Take 40 mg by mouth daily.        Marland Kitchen triamcinolone lotion (KENALOG) 0.1 % Apply topically 3 (three) times daily.  60 mL  0  . vitamin E 400 UNIT capsule Take 400 Units by mouth daily.        . Halcinonide 0.1 % CREA Apply twice daily to affected areas  60 g  3  . predniSONE (DELTASONE) 10 MG tablet Take daily for one week, then every other day for one week,  Then stop  10 tablet  0  . DISCONTD: predniSONE (STERAPRED UNI-PAK) 10 MG tablet 6 tablets on Day 1 , then reduce by 1 tablet daily until gone  21 tablet  0     No orders of the defined types were placed in this encounter.    No Follow-up on file.      ,

## 2011-04-11 NOTE — Patient Instructions (Signed)
We are going to continue 10 mg of prednisone daily for 1 week, then every other day for one week, then stop   I am prescribing a stronger ointment for the big patches.

## 2011-05-13 DIAGNOSIS — L408 Other psoriasis: Secondary | ICD-10-CM | POA: Diagnosis not present

## 2011-05-25 DIAGNOSIS — L408 Other psoriasis: Secondary | ICD-10-CM | POA: Diagnosis not present

## 2011-05-28 ENCOUNTER — Telehealth: Payer: Self-pay | Admitting: Internal Medicine

## 2011-05-28 DIAGNOSIS — L404 Guttate psoriasis: Secondary | ICD-10-CM

## 2011-06-02 NOTE — Assessment & Plan Note (Signed)
Now placed on Methotrexate by Dr. Roseanne Kaufman

## 2011-07-01 DIAGNOSIS — L408 Other psoriasis: Secondary | ICD-10-CM | POA: Diagnosis not present

## 2011-07-05 ENCOUNTER — Telehealth: Payer: Self-pay | Admitting: Internal Medicine

## 2011-07-05 DIAGNOSIS — Z79899 Other long term (current) drug therapy: Secondary | ICD-10-CM

## 2011-07-05 NOTE — Telephone Encounter (Signed)
Patient called and stated she has been diagnosed with psoriasis and is on methotrexate.  She stated you wanted her to have lab work done.  She wanted to come in tomorrow.  Please advise.

## 2011-07-05 NOTE — Telephone Encounter (Signed)
Patient notified. She will come in for labs tomorrow.

## 2011-07-06 ENCOUNTER — Other Ambulatory Visit (INDEPENDENT_AMBULATORY_CARE_PROVIDER_SITE_OTHER): Payer: Medicare Other | Admitting: *Deleted

## 2011-07-06 DIAGNOSIS — Z79899 Other long term (current) drug therapy: Secondary | ICD-10-CM | POA: Diagnosis not present

## 2011-07-06 LAB — CBC WITH DIFFERENTIAL/PLATELET
Basophils Absolute: 0 10*3/uL (ref 0.0–0.1)
Eosinophils Relative: 0.8 % (ref 0.0–5.0)
HCT: 39.7 % (ref 36.0–46.0)
Hemoglobin: 13.4 g/dL (ref 12.0–15.0)
Lymphocytes Relative: 33.8 % (ref 12.0–46.0)
Monocytes Relative: 10.4 % (ref 3.0–12.0)
Neutro Abs: 3.3 10*3/uL (ref 1.4–7.7)
RBC: 4.09 Mil/uL (ref 3.87–5.11)
RDW: 13.8 % (ref 11.5–14.6)
WBC: 6.1 10*3/uL (ref 4.5–10.5)

## 2011-07-07 LAB — COMPLETE METABOLIC PANEL WITH GFR
ALT: 29 U/L (ref 0–35)
CO2: 26 mEq/L (ref 19–32)
Calcium: 9.2 mg/dL (ref 8.4–10.5)
Chloride: 100 mEq/L (ref 96–112)
Creat: 0.63 mg/dL (ref 0.50–1.10)
GFR, Est African American: >89 mL/min
GFR, Est Non African American: >89 mL/min
Glucose, Bld: 148 mg/dL — ABNORMAL HIGH (ref 70–99)
Total Protein: 6.5 g/dL (ref 6.0–8.3)

## 2011-07-18 ENCOUNTER — Encounter: Payer: Self-pay | Admitting: Internal Medicine

## 2011-07-18 ENCOUNTER — Ambulatory Visit (INDEPENDENT_AMBULATORY_CARE_PROVIDER_SITE_OTHER): Payer: Medicare Other | Admitting: Internal Medicine

## 2011-07-18 VITALS — BP 122/78 | HR 73 | Temp 97.8°F | Ht 60.0 in | Wt 143.0 lb

## 2011-07-18 DIAGNOSIS — E559 Vitamin D deficiency, unspecified: Secondary | ICD-10-CM | POA: Diagnosis not present

## 2011-07-18 DIAGNOSIS — Z5189 Encounter for other specified aftercare: Secondary | ICD-10-CM | POA: Diagnosis not present

## 2011-07-18 DIAGNOSIS — R7309 Other abnormal glucose: Secondary | ICD-10-CM | POA: Diagnosis not present

## 2011-07-18 DIAGNOSIS — Z0000 Encounter for general adult medical examination without abnormal findings: Secondary | ICD-10-CM

## 2011-07-18 DIAGNOSIS — E785 Hyperlipidemia, unspecified: Secondary | ICD-10-CM

## 2011-07-18 DIAGNOSIS — K219 Gastro-esophageal reflux disease without esophagitis: Secondary | ICD-10-CM

## 2011-07-18 DIAGNOSIS — K5730 Diverticulosis of large intestine without perforation or abscess without bleeding: Secondary | ICD-10-CM

## 2011-07-18 DIAGNOSIS — IMO0001 Reserved for inherently not codable concepts without codable children: Secondary | ICD-10-CM

## 2011-07-18 DIAGNOSIS — R739 Hyperglycemia, unspecified: Secondary | ICD-10-CM

## 2011-07-18 DIAGNOSIS — I10 Essential (primary) hypertension: Secondary | ICD-10-CM

## 2011-07-18 DIAGNOSIS — Z1211 Encounter for screening for malignant neoplasm of colon: Secondary | ICD-10-CM

## 2011-07-18 DIAGNOSIS — L408 Other psoriasis: Secondary | ICD-10-CM | POA: Diagnosis not present

## 2011-07-18 DIAGNOSIS — L404 Guttate psoriasis: Secondary | ICD-10-CM

## 2011-07-18 LAB — LIPID PANEL: Triglycerides: 238 mg/dL — ABNORMAL HIGH (ref 0.0–149.0)

## 2011-07-18 MED ORDER — ESOMEPRAZOLE MAGNESIUM 20 MG PO CPDR: 20.0000 mg | DELAYED_RELEASE_CAPSULE | Freq: Every day | ORAL | Status: DC

## 2011-07-18 NOTE — Assessment & Plan Note (Signed)
well controlled  

## 2011-07-18 NOTE — Assessment & Plan Note (Signed)
Manage with statin therapy, with LDL of 119 triglycerides elevated to 238.  Low glycemic index diet recommended recommend resuming of daily exercise. We will repeat cholesterol in 6 months consider adding fenofibrate if still elevated

## 2011-07-18 NOTE — Assessment & Plan Note (Signed)
Status post low anterior resection in December 2012 4 history of perforation due to rectosigmoid diverticulosis diverticulitis.  She has been asymptomatic since her surgery

## 2011-07-18 NOTE — Assessment & Plan Note (Signed)
Managed by isenstein bit mtx and clobex.,  Diffuse.

## 2011-07-18 NOTE — Progress Notes (Addendum)
Patient ID: Emily Ryan, female   DOB: January 25, 1945, 66 y.o.   MRN: [id number] The patient is here for annual Medicare wellness examination and management of other chronic and acute problems. The patient reports that since her last visit she has been treated by Dr. Isa Rankin for guttate psoriasis which was precipitated by streptococcal pharyngitis infection and unfortunately aggravated by steroid treatment.  She  is now taking methotrexate and folic acid with slow but progressive resolution of rash. She is a bit dismayed by the lasting skin changes which Dr. Isa Rankin has reassured her that the scars will ventually fade..  she has gained several pounds since her last visit. She is not following a prediabetic or low glycemic index diet but is exercising regularly. She is not sure when her last colonoscopy was done.  She underwent a low anterior resection in December 2012  After resolving an episode of diverticulitis which resulted in a perforation of the rectosigmoid area in September 2012.  The  Surgery included  included Appendectomy, repair of a small bowel injury, splenic flexure mobilization and enteroclysis. According to operative report colonoscopy was not done at that time. She does have a family history of colon cancer in her grandmother. She is status post hysterectomy and does not require subsequent Pap smears. She has had her annual  mammogram recently but I do not have the results. . She has not had a DEXA scan several years and has questions about calcium and fish oil supplementation . she also has had an improvement in her reflux and wants to reduce her Nexium to 20 mg daily       The risk factors are reflected in the social history.  The roster of all physicians providing medical care to patient - is listed in the Snapshot section of the chart.  Activities of daily living:  The patient is 100% independent in all ADLs: dressing, toileting, feeding as well as independent mobility  Home safety  : The patient has smoke detectors in the home. They wear seatbelts.  There are no firearms at home. There is no violence in the home.   There is no risks for hepatitis, STDs or HIV. There is no   history of blood transfusion. They have no travel history to infectious disease endemic areas of the world.  The patient has seen their dentist in the last six month. They have seen their eye doctor in the last year. They admit to slight hearing difficulty with regard to whispered voices and some television programs.  They have deferred audiologic testing in the last year.  They do not  have excessive sun exposure. Discussed the need for sun protection: hats, long sleeves and use of sunscreen if there is significant sun exposure.   Diet: the importance of a healthy diet is discussed. They do have a healthy diet.  The benefits of regular aerobic exercise were discussed. She walks 4 times per week ,  20 minutes.   Depression screen: there are no signs or vegative symptoms of depression- irritability, change in appetite, anhedonia, sadness/tearfullness.  Cognitive assessment: the patient manages all their financial and personal affairs and is actively engaged. They could relate day,date,year and events; recalled 2/3 objects at 3 minutes; performed clock-face test normally.  The following portions of the patient's history were reviewed and updated as appropriate: allergies, current medications, past family history, past medical history,  past surgical history, past social history  and problem list.  Visual acuity was not assessed per patient preference  since she has regular follow up with her ophthalmologist. Hearing and body mass index were assessed and reviewed.   During the course of the visit the patient was educated and counseled about appropriate screening and preventive services including : fall prevention , diabetes screening, nutrition counseling, colorectal cancer screening, and recommended  immunizations.    Objective: BP 122/78  Pulse 73  Temp 97.8 F (36.6 C) (Oral)  Ht 5' (1.524 m)  Wt 143 lb (64.864 kg)  BMI 27.93 kg/m2  SpO2 96%  General Appearance:    Alert, cooperative, no distress, appears stated age  Head:    Normocephalic, without obvious abnormality, atraumatic  Eyes:    PERRL, conjunctiva/corneas clear, EOM's intact, fundi    benign, both eyes  Ears:    Normal TM's and external ear canals, both ears  Nose:   Nares normal, septum midline, mucosa normal, no drainage    or sinus tenderness  Throat:   Lips, mucosa, and tongue normal; teeth and gums normal  Neck:   Supple, symmetrical, trachea midline, no adenopathy;    thyroid:  no enlargement/tenderness/nodules; no carotid   bruit or JVD  Back:     Symmetric, no curvature, ROM normal, no CVA tenderness  Lungs:     Clear to auscultation bilaterally, respirations unlabored  Chest Wall:    No tenderness or deformity   Heart:    Regular rate and rhythm, S1 and S2 normal, no murmur, rub   or gallop  Breast Exam:    No tenderness, masses, or nipple abnormality  Abdomen:     Soft, non-tender, bowel sounds active all four quadrants,    no masses, no organomegaly  Genitalia:    Normal female without lesion, discharge or tenderness  Extremities:   Extremities normal, atraumatic, no cyanosis or edema  Pulses:   2+ and symmetric all extremities  Skin:   Skin color, texture, turgor dry, poor.  diffuse papular lesions   Lymph nodes:   Cervical, supraclavicular, and axillary nodes normal  Neurologic:   CNII-XII intact, normal strength, sensation and reflexes    throughout   Assessment and Plan  Guttate psoriasis Managed by isenstein bit mtx and clobex.,  Diffuse.    Diverticulosis of sigmoid colon Status post low anterior resection in December 2012 for history of perforation due to rectosigmoid diverticulosis diverticulitis.  She has been asymptomatic since her surgery  Hypertension well  controlled   Hyperlipidemia Manage with statin therapy, with LDL of 119 triglycerides elevated to 238.  Low glycemic index diet recommended recommend resuming of daily exercise. We will repeat cholesterol in 6 months consider adding fenofibrate if still elevated   Updated Medication List Outpatient Encounter Prescriptions as of 07/18/2011  Medication Sig Dispense Refill  . Calcium Carb-Cholecalciferol (CALCIUM PLUS VITAMIN D) 600-100 MG-UNIT CAPS Take by mouth. Two by mouth daily       . estrogens, conjugated, (PREMARIN) 0.45 MG tablet Take 0.45 mg by mouth daily. Take daily for 21 days then do not take for 7 days.       . Halcinonide 0.1 % CREA Apply twice daily to affected areas  60 g  3  . hydrochlorothiazide (HYDRODIURIL) 12.5 MG tablet Take 12.5 mg by mouth daily.        . Lactulose 20 GM/30ML SOLN Take 30 mLs (20 g total) by mouth every 6 (six) hours as needed.  500 mL  1  . lisinopril (PRINIVIL,ZESTRIL) 20 MG tablet Take 20 mg by mouth daily.        Marland Kitchen  loratadine (CLARITIN) 10 MG tablet Take 10 mg by mouth 2 (two) times daily.        . methotrexate (RHEUMATREX) 2.5 MG tablet Take 15 mg by mouth once a week.      . Multiple Vitamin (MULTIVITAMIN) capsule Take 1 capsule by mouth daily.        . Omega-3 Fatty Acids (FISH OIL) 1000 MG CAPS Take by mouth 2 (two) times daily.        . predniSONE (DELTASONE) 10 MG tablet Take daily for one week, then every other day for one week,  Then stop  10 tablet  0  . simvastatin (ZOCOR) 40 MG tablet Take 40 mg by mouth daily.        Marland Kitchen triamcinolone lotion (KENALOG) 0.1 % Apply topically 3 (three) times daily.  60 mL  0  . vitamin E 400 UNIT capsule Take 400 Units by mouth daily.        Marland Kitchen DISCONTD: esomeprazole (NEXIUM) 40 MG capsule Take 1 capsule (40 mg total) by mouth daily.  90 capsule  3  . esomeprazole (NEXIUM) 20 MG capsule Take 1 capsule (20 mg total) by mouth daily before breakfast.  90 capsule  3

## 2011-07-19 ENCOUNTER — Telehealth: Payer: Self-pay | Admitting: Internal Medicine

## 2011-07-19 LAB — COMPLETE METABOLIC PANEL WITH GFR
Albumin: 4 g/dL (ref 3.5–5.2)
CO2: 26 mEq/L (ref 19–32)
GFR, Est African American: >89 mL/min
GFR, Est Non African American: >89 mL/min
Glucose, Bld: 114 mg/dL — ABNORMAL HIGH (ref 70–99)
Potassium: 3.9 mEq/L (ref 3.5–5.3)
Sodium: 138 mEq/L (ref 135–145)
Total Protein: 6.9 g/dL (ref 6.0–8.3)

## 2011-07-19 NOTE — Telephone Encounter (Signed)
[facility].  They do not do colonoscopy.  Please advise who to send this patient to instead

## 2011-07-19 NOTE — Telephone Encounter (Signed)
She will need a consulatiton with Lynnae Prude.  [facility].

## 2011-08-01 ENCOUNTER — Encounter: Payer: Self-pay | Admitting: Internal Medicine

## 2011-08-09 NOTE — Telephone Encounter (Signed)
DONE

## 2011-09-28 DIAGNOSIS — D485 Neoplasm of uncertain behavior of skin: Secondary | ICD-10-CM | POA: Diagnosis not present

## 2011-09-28 DIAGNOSIS — C4491 Basal cell carcinoma of skin, unspecified: Secondary | ICD-10-CM | POA: Diagnosis not present

## 2011-09-28 DIAGNOSIS — L408 Other psoriasis: Secondary | ICD-10-CM | POA: Diagnosis not present

## 2011-10-01 ENCOUNTER — Encounter: Payer: Self-pay | Admitting: Internal Medicine

## 2011-10-03 MED ORDER — SIMVASTATIN 40 MG PO TABS: 40.0000 mg | ORAL_TABLET | Freq: Every day | ORAL | Status: DC

## 2011-10-03 MED ORDER — LISINOPRIL 20 MG PO TABS: 20.0000 mg | ORAL_TABLET | Freq: Every day | ORAL | Status: DC

## 2011-10-03 MED ORDER — HYDROCHLOROTHIAZIDE 12.5 MG PO TABS: 12.5000 mg | ORAL_TABLET | Freq: Every day | ORAL | Status: DC

## 2011-10-03 MED ORDER — LORATADINE 10 MG PO TABS: 10.0000 mg | ORAL_TABLET | Freq: Every day | ORAL | Status: DC

## 2011-10-04 ENCOUNTER — Encounter: Payer: Self-pay | Admitting: Internal Medicine

## 2011-10-04 MED ORDER — HYDROCHLOROTHIAZIDE 12.5 MG PO TABS: 12.5000 mg | ORAL_TABLET | Freq: Every day | ORAL | Status: DC

## 2011-10-04 MED ORDER — LORATADINE 10 MG PO TABS: 10.0000 mg | ORAL_TABLET | Freq: Every day | ORAL | Status: DC

## 2011-10-04 MED ORDER — SIMVASTATIN 40 MG PO TABS: 40.0000 mg | ORAL_TABLET | Freq: Every day | ORAL | Status: DC

## 2011-10-04 NOTE — Telephone Encounter (Signed)
RX originally printed.  Resent by eScribe.

## 2011-10-04 NOTE — Addendum Note (Signed)
Addended by: Luisa Dago on: 10/04/2011 05:42 PM   Modules accepted: Orders

## 2011-10-05 ENCOUNTER — Telehealth: Payer: Self-pay | Admitting: Internal Medicine

## 2011-10-05 ENCOUNTER — Telehealth: Payer: Self-pay

## 2011-10-05 MED ORDER — LISINOPRIL 20 MG PO TABS: 20.0000 mg | ORAL_TABLET | Freq: Every day | ORAL | Status: DC

## 2011-10-05 MED ORDER — HYDROCHLOROTHIAZIDE 12.5 MG PO TABS: 12.5000 mg | ORAL_TABLET | Freq: Every day | ORAL | Status: DC

## 2011-10-05 MED ORDER — LORATADINE 10 MG PO TABS: 10.0000 mg | ORAL_TABLET | Freq: Every day | ORAL | Status: DC

## 2011-10-05 MED ORDER — SIMVASTATIN 40 MG PO TABS: 40.0000 mg | ORAL_TABLET | Freq: Every day | ORAL | Status: DC

## 2011-10-05 NOTE — Telephone Encounter (Signed)
Patient notified by phone that Rx was ready for pick up

## 2011-10-05 NOTE — Telephone Encounter (Signed)
Pt came in today to pick up her rx linispril zocore htcz clarentin She needs 1 year rx with 90supply at time She take this to [facility] to be refilled. Pt would like to pick up today Her my chart stated they should be ready

## 2011-10-05 NOTE — Telephone Encounter (Signed)
On printer

## 2011-10-05 NOTE — Telephone Encounter (Signed)
Patient had a message on my chart requesting that she get hand written Rx for Lisinopril, claritan, Simvastatin, HCTZ  All 90 day supply with refills for a year so she can take them to [facility] to get filled. The nurse that worked yesterday called those Rx to [facility], Can you write those RX out so patient can come pick them up.

## 2011-10-07 DIAGNOSIS — C44611 Basal cell carcinoma of skin of unspecified upper limb, including shoulder: Secondary | ICD-10-CM | POA: Diagnosis not present

## 2011-10-10 ENCOUNTER — Encounter: Payer: Self-pay | Admitting: Internal Medicine

## 2011-10-19 ENCOUNTER — Encounter: Payer: Self-pay | Admitting: Internal Medicine

## 2011-10-19 ENCOUNTER — Ambulatory Visit (INDEPENDENT_AMBULATORY_CARE_PROVIDER_SITE_OTHER): Payer: Medicare Other | Admitting: Internal Medicine

## 2011-10-19 VITALS — BP 132/74 | HR 80 | Temp 98.4°F | Ht 60.0 in | Wt 143.5 lb

## 2011-10-19 DIAGNOSIS — I10 Essential (primary) hypertension: Secondary | ICD-10-CM

## 2011-10-19 DIAGNOSIS — IMO0001 Reserved for inherently not codable concepts without codable children: Secondary | ICD-10-CM

## 2011-10-19 DIAGNOSIS — R739 Hyperglycemia, unspecified: Secondary | ICD-10-CM

## 2011-10-19 DIAGNOSIS — R7309 Other abnormal glucose: Secondary | ICD-10-CM

## 2011-10-19 DIAGNOSIS — Z23 Encounter for immunization: Secondary | ICD-10-CM | POA: Diagnosis not present

## 2011-10-19 DIAGNOSIS — Z79899 Other long term (current) drug therapy: Secondary | ICD-10-CM | POA: Diagnosis not present

## 2011-10-19 DIAGNOSIS — E119 Type 2 diabetes mellitus without complications: Secondary | ICD-10-CM

## 2011-10-19 DIAGNOSIS — K219 Gastro-esophageal reflux disease without esophagitis: Secondary | ICD-10-CM | POA: Diagnosis not present

## 2011-10-19 DIAGNOSIS — E785 Hyperlipidemia, unspecified: Secondary | ICD-10-CM

## 2011-10-19 LAB — COMPREHENSIVE METABOLIC PANEL
ALT: 49 U/L — ABNORMAL HIGH (ref 0–35)
AST: 37 U/L (ref 0–37)
Calcium: 9 mg/dL (ref 8.4–10.5)
Chloride: 102 mEq/L (ref 96–112)
Creatinine, Ser: 0.6 mg/dL (ref 0.4–1.2)
Potassium: 3.9 mEq/L (ref 3.5–5.1)
Sodium: 135 mEq/L (ref 135–145)

## 2011-10-19 MED ORDER — ESOMEPRAZOLE MAGNESIUM 20 MG PO CPDR: 20.0000 mg | DELAYED_RELEASE_CAPSULE | Freq: Every day | ORAL | Status: DC

## 2011-10-19 MED ORDER — FISH OIL 1000 MG PO CAPS: 1.0000 | ORAL_CAPSULE | Freq: Every day | ORAL | Status: DC

## 2011-10-19 MED ORDER — CALCIUM CARB-CHOLECALCIFEROL 600-100 MG-UNIT PO CAPS: 1.0000 | ORAL_CAPSULE | Freq: Every day | ORAL | Status: DC

## 2011-10-19 NOTE — Patient Instructions (Addendum)
We will send your lab results to Dr. Isa Rankin  Keep your leg and arm wound covered until you have growth of skin,  You may apply sterile petroleum jelly if it appears too dry  Change dressings if soaked.

## 2011-10-19 NOTE — Progress Notes (Addendum)
Patient ID: Emily Ryan, female   DOB: 06-21-1945, 66 y.o.   MRN: [id number]  Patient Active Problem List  Diagnosis  . Hypertension  . Hyperlipidemia  . Gastritis  . Diverticulosis of sigmoid colon  . Constipation  . Reflux esophagitis  . Guttate psoriasis  . Routine general medical examination at a health care facility  . Diabetes mellitus, type II  . Encounter for long-term (current) use of other medications    Subjective:  CC:   Chief Complaint  Patient presents with  . Follow-up    HPI:   Emily Abbruzziis a 66 y.o. female who presents for three-month followup on chronic issues including hypertension hyperlipidemia and fasting hyperglycemia with no prior diagnosis of diabetes. She also requires Suture removal of left shoulder excision for basal carcinoma which was done by Dr. Roseanne Kaufman Oct 4th.   patient is requesting repeat liver function tests due to use of methotrexate and mild liver enzyme elevation noted previously by her dermatologist. She feels generally well. She has gained a few more pounds since last time.   she is continuing to exercise regularly but is not following a low glycemic index diet. Her rash is largely resolved and her psoriasis remains under control with methotrexate she is tolerating all her medications without symptoms. She denies any recent episodes of chest pain abdominal pain diarrhea or constipation.    Past Medical History  Diagnosis Date  . Hyperlipidemia   . Hypertension   . Menopausal disorder   . Complicated pregnancy     1st pregnancy complicated by post operative hemorrhage and 2ng complicated by epidural  . Diabetes mellitus   . Diverticulosis of colon (without mention of hemorrhage)   . Guttate psoriasis     Past Surgical History  Procedure Date  . Hernia repair   . Varicose vein surgery 1974    right leg   . Tonsillectomy 1953  . Septoplasty 1969  . Abdominal hysterectomy   . Bladder repair 1991    lift   . Rectocele  repair   . Cystocele repair   . Partial colectomy Nov 2012    left, secondary to diverticular perf  . Appendectomy Dec 2012         The following portions of the patient's history were reviewed and updated as appropriate: Allergies, current medications, and problem list.    Review of Systems:   12 Pt  review of systems was negative except those addressed in the HPI,     History   Social History  . Marital Status: Married    Spouse Name: N/A    Number of Children: N/A  . Years of Education: N/A   Occupational History  . Not on file.   Social History Main Topics  . Smoking status: Never Smoker   . Smokeless tobacco: Never Used  . Alcohol Use: Yes     Occasional  . Drug Use: No  . Sexually Active: Not on file   Other Topics Concern  . Not on file   Social History Narrative  . No narrative on file    Objective:  BP 132/74  Pulse 80  Temp 98.4 F (36.9 C) (Oral)  Ht 5' (1.524 m)  Wt 143 lb 8 oz (65.091 kg)  BMI 28.03 kg/m2  SpO2 96%  General appearance: alert, cooperative and appears stated age Ears: normal TM's and external ear canals both ears Throat: lips, mucosa, and tongue normal; teeth and gums normal Neck: no adenopathy, no carotid bruit, supple, symmetrical,  trachea midline and thyroid not enlarged, symmetric, no tenderness/mass/nodules Back: symmetric, no curvature. ROM normal. No CVA tenderness. Lungs: clear to auscultation bilaterally Heart: regular rate and rhythm, S1, S2 normal, no murmur, click, rub or gallop Abdomen: soft, non-tender; bowel sounds normal; no masses,  no organomegaly Pulses: 2+ and symmetric Skin: Left shoulder, posterior with 8 cm incision, edges apposed, no drainage or erythema.  Stitches removed.  Skin color, texture, turgor normal. No rashes or lesions Lymph nodes: Cervical, supraclavicular, and axillary nodes normal.  Assessment and Plan:  Diabetes mellitus, type II Diagnosed today with fasting glucose of 290 and  hemoglobin A1c of 8.2. Patient will begin checking her blood sugars daily and submit report in 2 weeks. Depending on her CBGs we will either start metformin and bedtime insulin or just metfromin. She is motivated now to start a low glycemic index diet and to lower her BMI to below 25.  Encounter for long-term (current) use of other medications Patient's liver enzymes are slightly elevated today. Unclear whether this is due to the methotrexate are due to fatty liver given her new onset diabetes and overweight. Copies of labs will be sent to her dermatologist Dr. Roseanne Kaufman and most likely she will stop the methotrexate and have her repeat her labs in one month.  Hyperlipidemia In the setting of new-onset diabetes her LDL of 119 is now above goal. Her concerns are also elevated to 238. We will repeat these in 3 months since she is now motivated to follow a low glycemic index diet  Hypertension Well-controlled on current regimen, renal function is stable.   Updated Medication List Outpatient Encounter Prescriptions as of 10/19/2011  Medication Sig Dispense Refill  . Calcium Carb-Cholecalciferol 600-100 MG-UNIT CAPS Take 1 tablet by mouth daily. Two by mouth daily  90 capsule  3  . esomeprazole (NEXIUM) 20 MG capsule Take 1 capsule (20 mg total) by mouth daily before breakfast.  90 capsule  3  . estrogens, conjugated, (PREMARIN) 0.45 MG tablet Take 0.45 mg by mouth daily. Take daily for 21 days then do not take for 7 days.       . Halcinonide 0.1 % CREA Apply twice daily to affected areas  60 g  3  . hydrochlorothiazide (HYDRODIURIL) 12.5 MG tablet Take 1 tablet (12.5 mg total) by mouth daily.  90 tablet  3  . Lactulose 20 GM/30ML SOLN Take 30 mLs (20 g total) by mouth every 6 (six) hours as needed.  500 mL  1  . lisinopril (PRINIVIL,ZESTRIL) 20 MG tablet Take 1 tablet (20 mg total) by mouth daily.  90 tablet  3  . loratadine (CLARITIN) 10 MG tablet Take 1 tablet (10 mg total) by mouth daily.  90  tablet  3  . methotrexate (RHEUMATREX) 2.5 MG tablet Take 15 mg by mouth once a week.      . Multiple Vitamin (MULTIVITAMIN) capsule Take 1 capsule by mouth daily.        . Omega-3 Fatty Acids (FISH OIL) 1000 MG CAPS Take 1 capsule (1,000 mg total) by mouth daily.  90 capsule  3  . predniSONE (DELTASONE) 10 MG tablet Take daily for one week, then every other day for one week,  Then stop  10 tablet  0  . simvastatin (ZOCOR) 40 MG tablet Take 1 tablet (40 mg total) by mouth daily.  90 tablet  3  . triamcinolone lotion (KENALOG) 0.1 % Apply topically 3 (three) times daily.  60 mL  0  .  vitamin E 400 UNIT capsule Take 400 Units by mouth daily.        Marland Kitchen DISCONTD: Calcium Carb-Cholecalciferol (CALCIUM PLUS VITAMIN D) 600-100 MG-UNIT CAPS Take by mouth. Two by mouth daily       . DISCONTD: esomeprazole (NEXIUM) 20 MG capsule Take 1 capsule (20 mg total) by mouth daily before breakfast.  90 capsule  3  . DISCONTD: Omega-3 Fatty Acids (FISH OIL) 1000 MG CAPS Take by mouth 2 (two) times daily.           Orders Placed This Encounter  Procedures  . [facility] MAMMOGRAPHY  . Flu vaccine greater than or equal to 3yo preservative free IM  . Hemoglobin A1c  . Comprehensive metabolic panel  . Gamma GT    No Follow-up on file.

## 2011-10-20 ENCOUNTER — Encounter: Payer: Self-pay | Admitting: Internal Medicine

## 2011-10-21 ENCOUNTER — Encounter: Payer: Self-pay | Admitting: Internal Medicine

## 2011-10-21 DIAGNOSIS — Z79899 Other long term (current) drug therapy: Secondary | ICD-10-CM | POA: Insufficient documentation

## 2011-10-21 DIAGNOSIS — E119 Type 2 diabetes mellitus without complications: Secondary | ICD-10-CM | POA: Insufficient documentation

## 2011-10-21 NOTE — Assessment & Plan Note (Signed)
Patient's liver enzymes are slightly elevated today. Unclear whether this is due to the methotrexate are due to fatty liver given her new onset diabetes and overweight. Copies of labs will be sent to her dermatologist Dr. Roseanne Kaufman and most likely she will stop the methotrexate and have her repeat her labs in one month.

## 2011-10-21 NOTE — Assessment & Plan Note (Signed)
Well-controlled on current regimen, renal function is stable.

## 2011-10-21 NOTE — Assessment & Plan Note (Signed)
In the setting of new-onset diabetes her LDL of 119 is now above goal. Her concerns are also elevated to 238. We will repeat these in 3 months since she is now motivated to follow a low glycemic index diet

## 2011-10-21 NOTE — Assessment & Plan Note (Addendum)
Diagnosed today with fasting glucose of 290 and hemoglobin A1c of 8.2. Patient will begin checking her blood sugars daily and submit report in 2 weeks. Depending on her CBGs we will either start methotrexate and bedtime insulin or just methotrexate. She is motivated now to start a low glycemic index diet and to lower her BMI to below 25.

## 2011-10-24 ENCOUNTER — Other Ambulatory Visit: Payer: Self-pay | Admitting: *Deleted

## 2011-10-24 MED ORDER — GLUCOSE BLOOD VI STRP: ORAL_STRIP | Status: DC

## 2011-10-24 MED ORDER — FREESTYLE LANCETS MISC: Status: DC

## 2011-10-24 NOTE — Telephone Encounter (Signed)
Pt called needing test strips and lancets for Freestyle Insulinx meter-no samples available, rx sent for test strips and lancets [facility].

## 2011-11-04 ENCOUNTER — Ambulatory Visit (INDEPENDENT_AMBULATORY_CARE_PROVIDER_SITE_OTHER): Payer: Medicare Other | Admitting: Internal Medicine

## 2011-11-04 ENCOUNTER — Encounter: Payer: Self-pay | Admitting: Internal Medicine

## 2011-11-04 VITALS — BP 140/82 | HR 80 | Temp 98.3°F | Resp 12

## 2011-11-04 DIAGNOSIS — E119 Type 2 diabetes mellitus without complications: Secondary | ICD-10-CM | POA: Diagnosis not present

## 2011-11-04 MED ORDER — METFORMIN HCL ER 750 MG PO TB24: 750.0000 mg | ORAL_TABLET | Freq: Every day | ORAL | Status: DC

## 2011-11-04 MED ORDER — INSULIN DETEMIR 100 UNIT/ML ~~LOC~~ SOLN: 15.0000 [IU] | Freq: Every day | SUBCUTANEOUS | Status: DC

## 2011-11-04 MED ORDER — GLIPIZIDE 5 MG PO TABS: 5.0000 mg | ORAL_TABLET | Freq: Two times a day (BID) | ORAL | Status: DC

## 2011-11-04 NOTE — Patient Instructions (Addendum)
You can try the Atkins Endulge bar and the peanut butter cup if you need to satisfy your craving for candy,   We are starting you on levemir insulin shots to help get your blood sugars down while the medicine starts to work,    Give yourself 15 units  Of insulin every day until your blood sugars are consistently  below 180.  The stop and we will adjust your oral medications  Start the metformin and the twice daily glipizide [facility].  Limit pasta to once a week.   Walking helps lower your postprandial blood sugars   Your goal is fasting sugar 80 to 120.  And postprandial (2 hours post meal) goals is 120 to 150  Return in one month

## 2011-11-05 ENCOUNTER — Encounter: Payer: Self-pay | Admitting: Internal Medicine

## 2011-11-05 DIAGNOSIS — E119 Type 2 diabetes mellitus without complications: Secondary | ICD-10-CM

## 2011-11-06 ENCOUNTER — Encounter: Payer: Self-pay | Admitting: Internal Medicine

## 2011-11-06 NOTE — Assessment & Plan Note (Signed)
Diagnosed with a glucose of 290 a hemoglobin A1c of 8.2. Low glycemic index diet is discussed and recommended. We are starting her on 15 units 11 insulin to help drop her sugars to below 180 so her medications can work. We are also starting metformin twice daily glipizide within the next week which is a chance [facility]. Limit pasta meals to once weekly,  bread to once daily , and walk  after her biggest meal which will help lower her postprandial sugars. She will return in one month.

## 2011-11-06 NOTE — Progress Notes (Signed)
Patient ID: Emily Ryan, female   DOB: [birth year], 66 y.o.   MRN: [id number]  Patient Active Problem List  Diagnosis  . Hypertension  . Hyperlipidemia  . Gastritis  . Diverticulosis of sigmoid colon  . Constipation  . Reflux esophagitis  . Guttate psoriasis  . Routine general medical examination at a health care facility  . Diabetes mellitus, type II  . Encounter for long-term (current) use of other medications    Subjective:  CC:   Chief Complaint  Patient presents with  . Follow-up    HPI:   Emily Abbruzziis a 65 y.o. female who presents  Past Medical History  Diagnosis Date  . Hyperlipidemia   . Hypertension   . Menopausal disorder   . Complicated pregnancy     1st pregnancy complicated by post operative hemorrhage and 2ng complicated by epidural  . Diabetes mellitus   . Diverticulosis of colon (without mention of hemorrhage)   . Guttate psoriasis     Past Surgical History  Procedure Date  . Hernia repair   . Varicose vein surgery 1974    right leg   . Tonsillectomy 1953  . Septoplasty 1969  . Abdominal hysterectomy   . Bladder repair 1991    lift   . Rectocele repair   . Cystocele repair   . Partial colectomy Nov 2012    left, secondary to diverticular perf  . Appendectomy Dec 2012         The following portions of the patient's history were reviewed and updated as appropriate: Allergies, current medications, and problem list.    Review of Systems:   12 Pt  review of systems was negative except those addressed in the HPI,     History   Social History  . Marital Status: Married    Spouse Name: N/A    Number of Children: N/A  . Years of Education: N/A   Occupational History  . Not on file.   Social History Main Topics  . Smoking status: Never Smoker   . Smokeless tobacco: Never Used  . Alcohol Use: 3.0 oz/week    5 Glasses of wine per week     Comment: Occasional  . Drug Use: No  . Sexually Active: Yes    Birth Control/  Protection: Post-menopausal   Other Topics Concern  . Not on file   Social History Narrative  . No narrative on file    Objective:  BP 140/82  Pulse 80  Temp 98.3 F (36.8 C) (Oral)  Resp 12  SpO2 95%  General appearance: alert, cooperative and appears stated age Lungs: clear to auscultation bilaterally Heart: regular rate and rhythm, S1, S2 normal, no murmur, click, rub or gallop Abdomen: soft, non-tender; bowel sounds normal; no masses,  no organomegaly Pulses: 2+ and symmetric Skin: Skin color, texture, turgor normal. No rashes or lesions Lymph nodes: Cervical, supraclavicular, and axillary nodes normal.  Assessment and Plan:  Diabetes mellitus, type II Diagnosed with a glucose of 290 a hemoglobin A1c of 8.2. Low glycemic index diet is discussed and recommended. We are starting her on 15 units 11 insulin to help drop her sugars to below 180 so her medications can work. We are also starting metformin twice daily glipizide within the next week which is [facility]. Limit pasta meals to once weekly,  bread to once daily , and walk  after her biggest meal which will help lower her postprandial sugars. She will return in  one month.   Updated Medication List Outpatient Encounter Prescriptions as of 11/04/2011  Medication Sig Dispense Refill  . Calcium Carb-Cholecalciferol 600-100 MG-UNIT CAPS Take 1 tablet by mouth daily. Two by mouth daily  90 capsule  3  . esomeprazole (NEXIUM) 20 MG capsule Take 1 capsule (20 mg total) by mouth daily before breakfast.  90 capsule  3  . estrogens, conjugated, (PREMARIN) 0.45 MG tablet Take 0.45 mg by mouth daily. Take daily for 21 days then do not take for 7 days.       Marland Kitchen glucose blood (FREESTYLE INSULINX TEST) test strip Use as instructed once daily to check blood sugar dx 250.00  32 each  1  . Halcinonide 0.1 % CREA Apply twice daily to affected areas  60 g  3  . hydrochlorothiazide (HYDRODIURIL) 12.5 MG tablet Take 1  tablet (12.5 mg total) by mouth daily.  90 tablet  3  . Lactulose 20 GM/30ML SOLN Take 30 mLs (20 g total) by mouth every 6 (six) hours as needed.  500 mL  1  . Lancets (FREESTYLE) lancets Use as instructed once daily to check blood sugar dx 250.00  32 each  1  . lisinopril (PRINIVIL,ZESTRIL) 20 MG tablet Take 1 tablet (20 mg total) by mouth daily.  90 tablet  3  . loratadine (CLARITIN) 10 MG tablet Take 1 tablet (10 mg total) by mouth daily.  90 tablet  3  . methotrexate (RHEUMATREX) 2.5 MG tablet Take 15 mg by mouth once a week.      . Multiple Vitamin (MULTIVITAMIN) capsule Take 1 capsule by mouth daily.        . Omega-3 Fatty Acids (FISH OIL) 1000 MG CAPS Take 1 capsule (1,000 mg total) by mouth daily.  90 capsule  3  . predniSONE (DELTASONE) 10 MG tablet Take daily for one week, then every other day for one week,  Then stop  10 tablet  0  . simvastatin (ZOCOR) 40 MG tablet Take 1 tablet (40 mg total) by mouth daily.  90 tablet  3  . triamcinolone lotion (KENALOG) 0.1 % Apply topically 3 (three) times daily.  60 mL  0  . vitamin E 400 UNIT capsule Take 400 Units by mouth daily.        Marland Kitchen glipiZIDE (GLUCOTROL) 5 MG tablet Take 1 tablet (5 mg total) by mouth 2 (two) times daily before a meal.  180 tablet  3  . insulin detemir (LEVEMIR) 100 UNIT/ML injection Inject 15 Units into the skin daily.  6 mL  0  . metFORMIN (GLUCOPHAGE XR) 750 MG 24 hr tablet Take 1 tablet (750 mg total) by mouth daily with breakfast.  90 tablet  3     Orders Placed This Encounter  Procedures  . Microalbumin / creatinine urine ratio  . Ambulatory referral to diabetic education    No Follow-up on file.

## 2011-11-07 ENCOUNTER — Encounter: Payer: Self-pay | Admitting: Internal Medicine

## 2011-11-07 ENCOUNTER — Telehealth: Payer: Self-pay | Admitting: Internal Medicine

## 2011-11-07 DIAGNOSIS — E119 Type 2 diabetes mellitus without complications: Secondary | ICD-10-CM

## 2011-11-07 MED ORDER — NEEDLES & SYRINGES MISC: Status: DC

## 2011-11-07 NOTE — Telephone Encounter (Signed)
Pt is saying that she wanted her Nutrition referral to be at [facility] instead of [facility]. She was wanting to know if this could be changed.

## 2011-11-08 ENCOUNTER — Telehealth: Payer: Self-pay | Admitting: *Deleted

## 2011-11-08 NOTE — Telephone Encounter (Signed)
Called pharmacy to let them know all patient needed was the syringes (20). And also to follow Dr. Ceasar Lund' directions.

## 2011-11-18 ENCOUNTER — Encounter: Payer: Self-pay | Admitting: Internal Medicine

## 2011-11-18 ENCOUNTER — Ambulatory Visit (INDEPENDENT_AMBULATORY_CARE_PROVIDER_SITE_OTHER): Payer: Medicare Other | Admitting: Internal Medicine

## 2011-11-18 ENCOUNTER — Other Ambulatory Visit: Payer: Self-pay

## 2011-11-18 ENCOUNTER — Other Ambulatory Visit: Payer: Self-pay | Admitting: Internal Medicine

## 2011-11-18 VITALS — BP 122/60 | HR 83 | Temp 98.5°F | Resp 12 | Ht 59.5 in | Wt 138.8 lb

## 2011-11-18 DIAGNOSIS — E119 Type 2 diabetes mellitus without complications: Secondary | ICD-10-CM

## 2011-11-18 MED ORDER — PRECISION XTRA KIT: PACK | Status: DC

## 2011-11-18 MED ORDER — GLIPIZIDE 5 MG PO TABS: 5.0000 mg | ORAL_TABLET | Freq: Two times a day (BID) | ORAL | Status: DC

## 2011-11-18 NOTE — Telephone Encounter (Signed)
Pt went back up to [facility] and they are saying that they still have not received pt's rx request.

## 2011-11-18 NOTE — Patient Instructions (Addendum)
Emily Ryan makes an  eggplant parmigiana and a  chicken piccata both sold in the frozen section  of [employer]   [employer] pasta is low carb and good tasting   fill the glipizide at a local pharmacy.,  Do not take until you are ready to eat to prevent low blood sugars   Decrease the insulin to 10 units today.  It will be time to stop when :  1) Your sugars are all < 150  OR  2 )YOU HAVE A LOW BLOOD SUGAR ( 80 OR BELOW)

## 2011-11-18 NOTE — Telephone Encounter (Signed)
[facility] and I was told that they received the Rx.

## 2011-11-20 ENCOUNTER — Encounter: Payer: Self-pay | Admitting: Internal Medicine

## 2011-11-20 NOTE — Progress Notes (Signed)
Patient ID: Emily Ryan, female   DOB: 10/27/1945, 66 y.o.   MRN: [id number]  Patient Active Problem List  Diagnosis  . Hypertension  . Hyperlipidemia  . Gastritis  . Diverticulosis of sigmoid colon  . Constipation  . Reflux esophagitis  . Guttate psoriasis  . Routine general medical examination at a health care facility  . Diabetes mellitus, type II  . Encounter for long-term (current) use of other medications    Subjective:  CC:   Chief Complaint  Patient presents with  . Follow-up    HPI:   Emily Abbruzziis a 66 y.o. female who presents 2 week follow up on diabetes sp intiation of basal insulin.  Unfortunately she was not instructed directly by staff as asked on how to administer insulin; she was just given printed instructions  Along with the  samples of pens, but her pharmacist instructed her how to administer the insulin using the pen and she has become quite comfortable with using it. Her blood sugars have been under 200 with 2 or 3 exceptions over the past two weeks, and her fasting blood sugars have been 150 to 180.  She has not started the glipizide or metfromin yet because she was waiting to go to [facility]. She has lost 5 lbs since the last visit intentionally but i shaving trouble restricting starches because her culture and passions include  baking bread and making pasta .   Past Medical History  Diagnosis Date  . Hyperlipidemia   . Hypertension   . Menopausal disorder   . Complicated pregnancy     1st pregnancy complicated by post operative hemorrhage and 2ng complicated by epidural  . Diabetes mellitus   . Diverticulosis of colon (without mention of hemorrhage)   . Guttate psoriasis     Past Surgical History  Procedure Date  . Hernia repair   . Varicose vein surgery 1974    right leg   . Tonsillectomy 1953  . Septoplasty 1969  . Abdominal hysterectomy   . Bladder repair 1991    lift   . Rectocele repair   . Cystocele repair   . Partial  colectomy Nov 2012    left, secondary to diverticular perf  . Appendectomy Dec 2012         The following portions of the patient's history were reviewed and updated as appropriate: Allergies, current medications, and problem list.    Review of Systems:   12 Pt  review of systems was negative except those addressed in the HPI,     History   Social History  . Marital Status: Married    Spouse Name: N/A    Number of Children: N/A  . Years of Education: N/A   Occupational History  . Not on file.   Social History Main Topics  . Smoking status: Never Smoker   . Smokeless tobacco: Never Used  . Alcohol Use: 3.0 oz/week    5 Glasses of wine per week     Comment: Occasional  . Drug Use: No  . Sexually Active: Yes    Birth Control/ Protection: Post-menopausal   Other Topics Concern  . Not on file   Social History Narrative  . No narrative on file    Objective:  BP 122/60  Pulse 83  Temp 98.5 F (36.9 C) (Oral)  Resp 12  Ht 4' 11.5" (1.511 m)  Wt 138 lb 12 oz (62.937 kg)  BMI 27.56 kg/m2  SpO2 94%  General appearance: alert, cooperative and  appears stated age Neck: no adenopathy, no carotid bruit, supple, symmetrical, trachea midline and thyroid not enlarged, symmetric, no tenderness/mass/nodules Lungs: clear to auscultation bilaterally Heart: regular rate and rhythm, S1, S2 normal, no murmur, click, rub or gallop Abdomen: soft, non-tender; bowel sounds normal; no masses,  no organomegaly Pulses: 2+ and symmetric Skin: Skin color, texture, turgor normal. No rashes or lesions  Assessment and Plan:  Diabetes mellitus, type II New onset. With hgba1c over 8.  levemir 15 units was started with plans to start metformin and glipizide and to decrease and eventually stop the insulin when her blood sugars are consistently < 150.  After one dose of glipizide at lunch she had a symptomatic hypoglycemic event (BS was 76).  She will stop the insulin now.    Updated  Medication List Outpatient Encounter Prescriptions as of 11/18/2011  Medication Sig Dispense Refill  . Calcium Carb-Cholecalciferol 600-100 MG-UNIT CAPS Take 1 tablet by mouth daily. Two by mouth daily  90 capsule  3  . esomeprazole (NEXIUM) 20 MG capsule Take 1 capsule (20 mg total) by mouth daily before breakfast.  90 capsule  3  . glipiZIDE (GLUCOTROL) 5 MG tablet Take 1 tablet (5 mg total) by mouth 2 (two) times daily before a meal.  60 tablet  3  . glucose blood (FREESTYLE INSULINX TEST) test strip Use as instructed once daily to check blood sugar dx 250.00  32 each  1  . hydrochlorothiazide (HYDRODIURIL) 12.5 MG tablet Take 1 tablet (12.5 mg total) by mouth daily.  90 tablet  3  . Lancets (FREESTYLE) lancets Use as instructed once daily to check blood sugar dx 250.00  32 each  1  . lisinopril (PRINIVIL,ZESTRIL) 20 MG tablet Take 1 tablet (20 mg total) by mouth daily.  90 tablet  3  . loratadine (CLARITIN) 10 MG tablet Take 1 tablet (10 mg total) by mouth daily.  90 tablet  3  . metFORMIN (GLUCOPHAGE XR) 750 MG 24 hr tablet Take 1 tablet (750 mg total) by mouth daily with breakfast.  90 tablet  3  . methotrexate (RHEUMATREX) 2.5 MG tablet Take 15 mg by mouth once a week.      . Multiple Vitamin (MULTIVITAMIN) capsule Take 1 capsule by mouth daily.        . Needles & Syringes MISC Minimum of 20 syringes for the Levemir pen (or one full bag)  20 each  0  . Omega-3 Fatty Acids (FISH OIL) 1000 MG CAPS Take 1 capsule (1,000 mg total) by mouth daily.  90 capsule  3  . simvastatin (ZOCOR) 40 MG tablet Take 1 tablet (40 mg total) by mouth daily.  90 tablet  3  . vitamin E 400 UNIT capsule Take 400 Units by mouth daily.        . [DISCONTINUED] glipiZIDE (GLUCOTROL) 5 MG tablet Take 1 tablet (5 mg total) by mouth 2 (two) times daily before a meal.  180 tablet  3  . [DISCONTINUED] insulin detemir (LEVEMIR) 100 UNIT/ML injection Inject 15 Units into the skin daily.  6 mL  0  . Blood Glucose Monitoring  Suppl (PRECISION XTRA) KIT For use two times daily  1 each  1  . estrogens, conjugated, (PREMARIN) 0.45 MG tablet Take 0.45 mg by mouth daily. Take daily for 21 days then do not take for 7 days.       . Halcinonide 0.1 % CREA Apply twice daily to affected areas  60 g  3  .  Lactulose 20 GM/30ML SOLN Take 30 mLs (20 g total) by mouth every 6 (six) hours as needed.  500 mL  1  . predniSONE (DELTASONE) 10 MG tablet Take daily for one week, then every other day for one week,  Then stop  10 tablet  0  . triamcinolone lotion (KENALOG) 0.1 % Apply topically 3 (three) times daily.  60 mL  0     No orders of the defined types were placed in this encounter.    No Follow-up on file.

## 2011-11-20 NOTE — Assessment & Plan Note (Signed)
New onset. With hgba1c over 8.  levemir 15 units was started with plans to start metformin and glipizide and to decrease and eventually stop the insulin when her blood sugars are consistently < 150.  After one dose of glipizide at lunch she had a symptomatic hypoglycemic event (BS was 76).  She will stop the insulin now.

## 2011-11-25 ENCOUNTER — Ambulatory Visit: Payer: Self-pay | Admitting: Internal Medicine

## 2011-11-25 DIAGNOSIS — E119 Type 2 diabetes mellitus without complications: Secondary | ICD-10-CM | POA: Diagnosis not present

## 2011-11-25 DIAGNOSIS — Z7189 Other specified counseling: Secondary | ICD-10-CM | POA: Diagnosis not present

## 2011-11-25 NOTE — Telephone Encounter (Signed)
Emily Ryan request to [facility] to schedule patient .

## 2011-11-27 ENCOUNTER — Encounter: Payer: Self-pay | Admitting: Internal Medicine

## 2011-11-29 ENCOUNTER — Encounter: Payer: Self-pay | Admitting: Internal Medicine

## 2011-11-30 ENCOUNTER — Telehealth: Payer: Self-pay | Admitting: Internal Medicine

## 2011-11-30 NOTE — Telephone Encounter (Signed)
Pt called back 11/27 am wanted appointment with dr Darrick Huntsman.  With pt symptoms sent to triage nurse  Appointment Request From: Emily Ryan      With Provider: Duncan Dull, MD [-Primary Care Physician-]      Preferred Date Range: Any date 11/30/2011 or later      Preferred Times: Wed Afternoon      Reason for visit: Problem Follow-Up Visit      Comments:

## 2011-11-30 NOTE — Telephone Encounter (Signed)
FYI

## 2011-12-04 ENCOUNTER — Ambulatory Visit: Payer: Self-pay | Admitting: Internal Medicine

## 2011-12-05 ENCOUNTER — Ambulatory Visit (INDEPENDENT_AMBULATORY_CARE_PROVIDER_SITE_OTHER): Payer: Medicare Other | Admitting: Internal Medicine

## 2011-12-05 ENCOUNTER — Encounter: Payer: Self-pay | Admitting: Internal Medicine

## 2011-12-05 VITALS — BP 120/70 | HR 81 | Temp 97.8°F | Resp 12 | Ht 59.5 in | Wt 139.2 lb

## 2011-12-05 DIAGNOSIS — E785 Hyperlipidemia, unspecified: Secondary | ICD-10-CM

## 2011-12-05 DIAGNOSIS — Z1331 Encounter for screening for depression: Secondary | ICD-10-CM | POA: Diagnosis not present

## 2011-12-05 DIAGNOSIS — E119 Type 2 diabetes mellitus without complications: Secondary | ICD-10-CM

## 2011-12-05 MED ORDER — GLIPIZIDE 5 MG PO TABS: 5.0000 mg | ORAL_TABLET | Freq: Two times a day (BID) | ORAL | Status: DC

## 2011-12-05 NOTE — Assessment & Plan Note (Addendum)
Currently well controll with one metformin 750 mg in the morning and 5 mg glipizide in the evening,. Repeat A1c is due in January.  taking a baby aspirin, ACE inhibitor,  Statin and fish oil.  Foot exam normal ,  Eye exam normal in August.

## 2011-12-05 NOTE — Assessment & Plan Note (Addendum)
LDL and trigs are elevated.  continue fish oil and satin.  Repeat due in January

## 2011-12-05 NOTE — Patient Instructions (Signed)
Use 125 as your goal blood sugar  To see if the glipizide is enough for evening,  Check 2 hour post dinner and fasting blood sugars  (fasting 80 to 120,  Post dinner < 150)  We will repeat your a1c mid January.

## 2011-12-05 NOTE — Progress Notes (Signed)
Patient ID: Emily Ryan, female   DOB: Jan 22, 1945, 66 y.o.   MRN: [id number]  Patient Active Problem List  Diagnosis  . Hypertension  . Hyperlipidemia  . Gastritis  . Diverticulosis of sigmoid colon  . Constipation  . Reflux esophagitis  . Guttate psoriasis  . Routine general medical examination at a health care facility  . Diabetes mellitus, type II  . Encounter for long-term (current) use of other medications    Subjective:  CC:   Chief Complaint  Patient presents with  . Follow-up    HPI:   Emily Ryan is a 66 y.o. female who presents for diabetes followup.  She has had recurrent hypoglycemic events brought on by taking glipizide in the morning with breakfast and following her daily routine of exercise.   She has had similar problems with evening metformin, so she has stopped the morning glipizide dose and the evening metformin dose and feels better.  She has not lost any weight.  Using a free online tool to track her sugars.  She has been checking her blood sugars and most are < 130 both fasting and post prandial.        Past Medical History  Diagnosis Date  . Hyperlipidemia   . Hypertension   . Menopausal disorder   . Complicated pregnancy     1st pregnancy complicated by post operative hemorrhage and 2ng complicated by epidural  . Diabetes mellitus   . Diverticulosis of colon (without mention of hemorrhage)   . Guttate psoriasis     Past Surgical History  Procedure Date  . Hernia repair   . Varicose vein surgery 1974    right leg   . Tonsillectomy 1953  . Septoplasty 1969  . Abdominal hysterectomy   . Bladder repair 1991    lift   . Rectocele repair   . Cystocele repair   . Partial colectomy Nov 2012    left, secondary to diverticular perf  . Appendectomy Dec 2012   The following portions of the patient's history were reviewed and updated as appropriate: Allergies, current medications, and problem list.    Review of Systems:   Patient denies  headache, fevers, malaise, unintentional weight loss, skin rash, eye pain, sinus congestion and sinus pain, sore throat, dysphagia,  hemoptysis , cough, dyspnea, wheezing, chest pain, palpitations, orthopnea, edema, abdominal pain, nausea, melena, diarrhea, constipation, flank pain, dysuria, hematuria, urinary  Frequency, nocturia, numbness, tingling, seizures,  Focal weakness, Loss of consciousness,  Tremor, insomnia, depression, anxiety, and suicidal ideation.        History   Social History  . Marital Status: Married    Spouse Name: N/A    Number of Children: N/A  . Years of Education: N/A   Occupational History  . Not on file.   Social History Main Topics  . Smoking status: Never Smoker   . Smokeless tobacco: Never Used  . Alcohol Use: 3.0 oz/week    5 Glasses of wine per week     Comment: Occasional  . Drug Use: No  . Sexually Active: Yes    Birth Control/ Protection: Post-menopausal   Other Topics Concern  . Not on file   Social History Narrative  . No narrative on file    Objective:  BP 120/70  Pulse 81  Temp 97.8 F (36.6 C) (Oral)  Resp 12  Ht 4' 11.5" (1.511 m)  Wt 139 lb 4 oz (63.163 kg)  BMI 27.65 kg/m2  SpO2 95%  General appearance: alert, cooperative  and appears stated age Ears: normal TM's and external ear canals both ears Throat: lips, mucosa, and tongue normal; teeth and gums normal Neck: no adenopathy, no carotid bruit, supple, symmetrical, trachea midline and thyroid not enlarged, symmetric, no tenderness/mass/nodules Back: symmetric, no curvature. ROM normal. No CVA tenderness. Lungs: clear to auscultation bilaterally Heart: regular rate and rhythm, S1, S2 normal, no murmur, click, rub or gallop Abdomen: soft, non-tender; bowel sounds normal; no masses,  no organomegaly Pulses: 2+ and symmetric Skin: Skin color, texture, turgor normal. No rashes or lesions Lymph nodes: Cervical, supraclavicular, and axillary nodes normal. Foot exam:  Nails  are well trimmed,  No callouses,  Sensation intact to microfilament  Assessment and Plan:  Diabetes mellitus, type II Currently well controll with one metformin 750 mg in the morning and 5 mg glipizide in the evening,. Repeat A1c is due in January.  taking a baby aspirin, ACE inhibitor,  Statin and fish oil.  Foot exam normal ,  Eye exam normal in August.   Hyperlipidemia LDL and trigs are elevated.  continue fish oil and satin.  Repeat due in January    Updated Medication List Outpatient Encounter Prescriptions as of 12/05/2011  Medication Sig Dispense Refill  . Blood Glucose Monitoring Suppl (PRECISION XTRA) KIT For use two times daily  1 each  1  . Calcium Carb-Cholecalciferol 600-100 MG-UNIT CAPS Take 1 tablet by mouth daily. Two by mouth daily  90 capsule  3  . esomeprazole (NEXIUM) 20 MG capsule Take 1 capsule (20 mg total) by mouth daily before breakfast.  90 capsule  3  . glipiZIDE (GLUCOTROL) 5 MG tablet Take 1 tablet (5 mg total) by mouth 2 (two) times daily before a meal.  180 tablet  3  . glucose blood (FREESTYLE INSULINX TEST) test strip Use as instructed once daily to check blood sugar dx 250.00  32 each  1  . hydrochlorothiazide (HYDRODIURIL) 12.5 MG tablet Take 1 tablet (12.5 mg total) by mouth daily.  90 tablet  3  . Lancets (FREESTYLE) lancets Use as instructed once daily to check blood sugar dx 250.00  32 each  1  . lisinopril (PRINIVIL,ZESTRIL) 20 MG tablet Take 1 tablet (20 mg total) by mouth daily.  90 tablet  3  . loratadine (CLARITIN) 10 MG tablet Take 1 tablet (10 mg total) by mouth daily.  90 tablet  3  . metFORMIN (GLUCOPHAGE XR) 750 MG 24 hr tablet Take 1 tablet (750 mg total) by mouth daily with breakfast.  90 tablet  3  . methotrexate (RHEUMATREX) 2.5 MG tablet Take 15 mg by mouth once a week.      . Multiple Vitamin (MULTIVITAMIN) capsule Take 1 capsule by mouth daily.        . Omega-3 Fatty Acids (FISH OIL) 1000 MG CAPS Take 1 capsule (1,000 mg total) by mouth  daily.  90 capsule  3  . simvastatin (ZOCOR) 40 MG tablet Take 1 tablet (40 mg total) by mouth daily.  90 tablet  3  . vitamin E 400 UNIT capsule Take 400 Units by mouth daily.        . [DISCONTINUED] glipiZIDE (GLUCOTROL) 5 MG tablet Take 1 tablet (5 mg total) by mouth 2 (two) times daily before a meal.  60 tablet  3  . estrogens, conjugated, (PREMARIN) 0.45 MG tablet Take 0.45 mg by mouth daily. Take daily for 21 days then do not take for 7 days.       . Halcinonide 0.1 %  CREA Apply twice daily to affected areas  60 g  3  . Lactulose 20 GM/30ML SOLN Take 30 mLs (20 g total) by mouth every 6 (six) hours as needed.  500 mL  1  . Needles & Syringes MISC Minimum of 20 syringes for the Levemir pen (or one full bag)  20 each  0  . predniSONE (DELTASONE) 10 MG tablet Take daily for one week, then every other day for one week,  Then stop  10 tablet  0  . triamcinolone lotion (KENALOG) 0.1 % Apply topically 3 (three) times daily.  60 mL  0     Orders Placed This Encounter  Procedures  . [facility] DIABETES EYE EXAM    No Follow-up on file.

## 2011-12-07 ENCOUNTER — Ambulatory Visit: Payer: Medicare Other | Admitting: Internal Medicine

## 2011-12-21 DIAGNOSIS — L408 Other psoriasis: Secondary | ICD-10-CM | POA: Diagnosis not present

## 2011-12-21 DIAGNOSIS — D485 Neoplasm of uncertain behavior of skin: Secondary | ICD-10-CM | POA: Diagnosis not present

## 2011-12-21 DIAGNOSIS — C44611 Basal cell carcinoma of skin of unspecified upper limb, including shoulder: Secondary | ICD-10-CM | POA: Diagnosis not present

## 2011-12-21 DIAGNOSIS — Z85828 Personal history of other malignant neoplasm of skin: Secondary | ICD-10-CM | POA: Diagnosis not present

## 2012-01-11 ENCOUNTER — Ambulatory Visit: Payer: Self-pay | Admitting: Internal Medicine

## 2012-01-11 DIAGNOSIS — Z7189 Other specified counseling: Secondary | ICD-10-CM | POA: Diagnosis not present

## 2012-01-11 DIAGNOSIS — E119 Type 2 diabetes mellitus without complications: Secondary | ICD-10-CM | POA: Diagnosis not present

## 2012-01-12 DIAGNOSIS — C44519 Basal cell carcinoma of skin of other part of trunk: Secondary | ICD-10-CM | POA: Diagnosis not present

## 2012-01-12 DIAGNOSIS — C44611 Basal cell carcinoma of skin of unspecified upper limb, including shoulder: Secondary | ICD-10-CM | POA: Diagnosis not present

## 2012-01-12 DIAGNOSIS — C4441 Basal cell carcinoma of skin of scalp and neck: Secondary | ICD-10-CM | POA: Diagnosis not present

## 2012-01-13 ENCOUNTER — Telehealth: Payer: Self-pay | Admitting: *Deleted

## 2012-01-13 NOTE — Telephone Encounter (Signed)
PLEASE postpone her lab draw until jan 16th or insurance may not pay.  bc it has been < 3 months   CMEt, hgba1c GGT,  Fasting lipids

## 2012-01-13 NOTE — Telephone Encounter (Signed)
This pt is coming in for labs Monday (01.13.2014) what labs and dx would you like? Thank you  

## 2012-01-14 ENCOUNTER — Encounter: Payer: Self-pay | Admitting: Internal Medicine

## 2012-01-14 DIAGNOSIS — Z79899 Other long term (current) drug therapy: Secondary | ICD-10-CM

## 2012-01-14 DIAGNOSIS — E119 Type 2 diabetes mellitus without complications: Secondary | ICD-10-CM

## 2012-01-16 ENCOUNTER — Other Ambulatory Visit: Payer: Medicare Other

## 2012-01-18 ENCOUNTER — Encounter: Payer: Self-pay | Admitting: Internal Medicine

## 2012-01-19 ENCOUNTER — Other Ambulatory Visit (INDEPENDENT_AMBULATORY_CARE_PROVIDER_SITE_OTHER): Payer: Medicare Other

## 2012-01-19 ENCOUNTER — Telehealth: Payer: Self-pay | Admitting: *Deleted

## 2012-01-19 DIAGNOSIS — Z79899 Other long term (current) drug therapy: Secondary | ICD-10-CM | POA: Diagnosis not present

## 2012-01-19 DIAGNOSIS — E119 Type 2 diabetes mellitus without complications: Secondary | ICD-10-CM | POA: Diagnosis not present

## 2012-01-19 LAB — BASIC METABOLIC PANEL
BUN: 15 mg/dL (ref 6–23)
Calcium: 8.8 mg/dL (ref 8.4–10.5)
Chloride: 105 mEq/L (ref 96–112)
Creatinine, Ser: 0.6 mg/dL (ref 0.4–1.2)

## 2012-01-19 LAB — HEMOGLOBIN A1C: Hgb A1c MFr Bld: 6.8 % — ABNORMAL HIGH (ref 4.6–6.5)

## 2012-01-19 LAB — LIPID PANEL
Cholesterol: 158 mg/dL (ref 0–200)
Triglycerides: 93 mg/dL (ref 0.0–149.0)
VLDL: 18.6 mg/dL (ref 0.0–40.0)

## 2012-01-19 LAB — HEPATIC FUNCTION PANEL
AST: 32 U/L (ref 0–37)
Albumin: 3.8 g/dL (ref 3.5–5.2)

## 2012-01-19 NOTE — Telephone Encounter (Signed)
They are part of the hepatic panel that is already ordered

## 2012-01-19 NOTE — Telephone Encounter (Signed)
Pt would like to add a Aspartate aminotransferase and a alanine aminotransferase to her labs for her dermatologist (Dr. Gilman Buttner) [facility]

## 2012-01-26 ENCOUNTER — Encounter: Payer: Self-pay | Admitting: Internal Medicine

## 2012-01-26 ENCOUNTER — Ambulatory Visit (INDEPENDENT_AMBULATORY_CARE_PROVIDER_SITE_OTHER): Payer: Medicare Other | Admitting: Internal Medicine

## 2012-01-26 VITALS — BP 120/72 | HR 76 | Temp 97.6°F | Resp 16 | Wt 134.8 lb

## 2012-01-26 DIAGNOSIS — Z79899 Other long term (current) drug therapy: Secondary | ICD-10-CM

## 2012-01-26 DIAGNOSIS — E119 Type 2 diabetes mellitus without complications: Secondary | ICD-10-CM | POA: Diagnosis not present

## 2012-01-26 DIAGNOSIS — I10 Essential (primary) hypertension: Secondary | ICD-10-CM

## 2012-01-26 NOTE — Progress Notes (Signed)
Patient ID: Emily Ryan, female   DOB: 12/12/1945, 67 y.o.   MRN: [id number]  Patient Active Problem List  Diagnosis  . Hypertension  . Hyperlipidemia  . Gastritis  . Diverticulosis of sigmoid colon  . Constipation  . Reflux esophagitis  . Guttate psoriasis  . Routine general medical examination at a health care facility  . Diabetes mellitus, type II  . Encounter for long-term (current) use of other medications    Subjective:  CC:   Chief Complaint  Patient presents with  . Follow-up    Labs    HPI:   Emily Abbruzziis a 67 y.o. female who presents for a  3 month follow up on new onset diabetes, dignoased 3 months ago with hgba1c of 8. 0.  She has modified her diet by reducing her intake of carbohydrates and is exercising a minimum of 4 days per week after breakfast.  Her medication regimen is now metformin and glipizide.  Lantus was discontinued.  She has beought a log of her blood sugars, which are extremley well controlled .  She has occasional post prandial lows to 70.  She has gone to diabetes education, which has advised having 30 to 45 carbs per meal, and is wanting clarification on that number from me, since my recommendations have been to limit starches to one per meal .   2) Persistent RLE swelling,  With history of prior vein stripping on that side,  Wearing compression stockings.    Past Medical History  Diagnosis Date  . Hyperlipidemia   . Hypertension   . Menopausal disorder   . Complicated pregnancy     1st pregnancy complicated by post operative hemorrhage and 2ng complicated by epidural  . Diabetes mellitus   . Diverticulosis of colon (without mention of hemorrhage)   . Guttate psoriasis     Past Surgical History  Procedure Date  . Hernia repair   . Varicose vein surgery 1974    right leg   . Tonsillectomy 1953  . Septoplasty 1969  . Abdominal hysterectomy   . Bladder repair 1991    lift   . Rectocele repair   . Cystocele repair   . Partial  colectomy Nov 2012    left, secondary to diverticular perf  . Appendectomy Dec 2012         The following portions of the patient's history were reviewed and updated as appropriate: Allergies, current medications, and problem list.    Review of Systems:  Patient denies headache, fevers, malaise, unintentional weight loss, skin rash, eye pain, sinus congestion and sinus pain, sore throat, dysphagia,  hemoptysis , cough, dyspnea, wheezing, chest pain, palpitations, orthopnea, edema, abdominal pain, nausea, melena, diarrhea, constipation, flank pain, dysuria, hematuria, urinary  Frequency, nocturia, numbness, tingling, seizures,  Focal weakness, Loss of consciousness,  Tremor, insomnia, depression, anxiety, and suicidal ideation.     History   Social History  . Marital Status: Married    Spouse Name: N/A    Number of Children: N/A  . Years of Education: N/A   Occupational History  . Not on file.   Social History Main Topics  . Smoking status: Never Smoker   . Smokeless tobacco: Never Used  . Alcohol Use: 3.0 oz/week    5 Glasses of wine per week     Comment: Occasional  . Drug Use: No  . Sexually Active: Yes    Birth Control/ Protection: Post-menopausal   Other Topics Concern  . Not on file   Social  History Narrative  . No narrative on file    Objective:  BP 120/72  Pulse 76  Temp 97.6 F (36.4 C) (Oral)  Resp 16  Wt 134 lb 12 oz (61.122 kg)  SpO2 95%  General appearance: alert, cooperative and appears stated age Ears: normal TM's and external ear canals both ears Throat: lips, mucosa, and tongue normal; teeth and gums normal Neck: no adenopathy, no carotid bruit, supple, symmetrical, trachea midline and thyroid not enlarged, symmetric, no tenderness/mass/nodules Back: symmetric, no curvature. ROM normal. No CVA tenderness. Lungs: clear to auscultation bilaterally Heart: regular rate and rhythm, S1, S2 normal, no murmur, click, rub or gallop Abdomen: soft,  non-tender; bowel sounds normal; no masses,  no organomegaly Pulses: 2+ and symmetric Skin: Skin color, texture, turgor normal. No rashes or lesions Lymph nodes: Cervical, supraclavicular, and axillary nodes normal. Foot exam:  Nails are well trimmed,  No callouses,  Sensation intact to microfilament  Assessment and Plan:  Diabetes mellitus, type II Improved, A1c now 6.8.  Continue metformin and glipizide.  Given occasional hypoglycemic events, I have recommended that she take the evening glipizide dose only if she is having a starch at her meal .  Continue ACE I ,  Statin and add asa.  Reminder for diabetic eye exam annually.  Foot exam normal.   Hypertension Well controlled on current regimen. Renal function stable, no changes today.  Encounter for long-term (current) use of other medications lfts are normalizing with weight loss and control of diabetes .  Continue simvastatin.   Updated Medication List Outpatient Encounter Prescriptions as of 01/26/2012  Medication Sig Dispense Refill  . Blood Glucose Monitoring Suppl (PRECISION XTRA) KIT For use two times daily  1 each  1  . Calcium Carb-Cholecalciferol 600-100 MG-UNIT CAPS Take 1 tablet by mouth daily. Two by mouth daily  90 capsule  3  . esomeprazole (NEXIUM) 20 MG capsule Take 1 capsule (20 mg total) by mouth daily before breakfast.  90 capsule  3  . glipiZIDE (GLUCOTROL) 5 MG tablet Take 1 tablet (5 mg total) by mouth 2 (two) times daily before a meal.  180 tablet  3  . glucose blood (FREESTYLE INSULINX TEST) test strip Use as instructed once daily to check blood sugar dx 250.00  32 each  1  . hydrochlorothiazide (HYDRODIURIL) 12.5 MG tablet Take 1 tablet (12.5 mg total) by mouth daily.  90 tablet  3  . lisinopril (PRINIVIL,ZESTRIL) 20 MG tablet Take 1 tablet (20 mg total) by mouth daily.  90 tablet  3  . loratadine (CLARITIN) 10 MG tablet Take 1 tablet (10 mg total) by mouth daily.  90 tablet  3  . metFORMIN (GLUCOPHAGE XR) 750  MG 24 hr tablet Take 1 tablet (750 mg total) by mouth daily with breakfast.  90 tablet  3  . methotrexate (RHEUMATREX) 2.5 MG tablet Take 15 mg by mouth once a week.      . Multiple Vitamin (MULTIVITAMIN) capsule Take 1 capsule by mouth daily.        . Needles & Syringes MISC Minimum of 20 syringes for the Levemir pen (or one full bag)  20 each  0  . Omega-3 Fatty Acids (FISH OIL) 1000 MG CAPS Take 1 capsule (1,000 mg total) by mouth daily.  90 capsule  3  . simvastatin (ZOCOR) 40 MG tablet Take 1 tablet (40 mg total) by mouth daily.  90 tablet  3  . vitamin E 400 UNIT capsule Take 400 Units  by mouth daily.        . [DISCONTINUED] estrogens, conjugated, (PREMARIN) 0.45 MG tablet Take 0.45 mg by mouth daily. Take daily for 21 days then do not take for 7 days.       . [DISCONTINUED] Halcinonide 0.1 % CREA Apply twice daily to affected areas  60 g  3  . [DISCONTINUED] Lactulose 20 GM/30ML SOLN Take 30 mLs (20 g total) by mouth every 6 (six) hours as needed.  500 mL  1  . [DISCONTINUED] Lancets (FREESTYLE) lancets Use as instructed once daily to check blood sugar dx 250.00  32 each  1  . [DISCONTINUED] predniSONE (DELTASONE) 10 MG tablet Take daily for one week, then every other day for one week,  Then stop  10 tablet  0  . [DISCONTINUED] triamcinolone lotion (KENALOG) 0.1 % Apply topically 3 (three) times daily.  60 mL  0     No orders of the defined types were placed in this encounter.    No Follow-up on file.

## 2012-01-26 NOTE — Patient Instructions (Addendum)
Do not take your evening glipizide pill if you dinner does not have a starch   Same goes for breakfast ; if you do no have a starch or fruit for breakfast.,  Do not take the glipizide that morning  We will set you up for an ultrasound of right lower leg for chronic swelling

## 2012-01-27 MED ORDER — PRECISION THIN LANCETS MISC: Status: AC

## 2012-01-27 MED ORDER — GLUCOSE BLOOD VI STRP: ORAL_STRIP | Status: DC

## 2012-01-29 ENCOUNTER — Encounter: Payer: Self-pay | Admitting: Internal Medicine

## 2012-01-29 NOTE — Assessment & Plan Note (Signed)
Well controlled on current regimen. Renal function stable, no changes today. 

## 2012-01-29 NOTE — Assessment & Plan Note (Addendum)
lfts are normalizing with weight loss and control of diabetes .  Continue simvastatin.

## 2012-01-29 NOTE — Assessment & Plan Note (Addendum)
Improved, A1c now 6.8.  Continue metformin and glipizide.  Given occasional hypoglycemic events, I have recommended that she take the evening glipizide dose only if she is having a starch at her meal .  Continue ACE I ,  Statin and add asa.  Reminder for diabetic eye exam annually.  Foot exam normal.

## 2012-02-04 ENCOUNTER — Ambulatory Visit: Payer: Self-pay | Admitting: Internal Medicine

## 2012-03-20 ENCOUNTER — Encounter: Payer: Self-pay | Admitting: Internal Medicine

## 2012-03-20 DIAGNOSIS — Z1239 Encounter for other screening for malignant neoplasm of breast: Secondary | ICD-10-CM

## 2012-03-21 DIAGNOSIS — C44519 Basal cell carcinoma of skin of other part of trunk: Secondary | ICD-10-CM | POA: Diagnosis not present

## 2012-03-21 DIAGNOSIS — B079 Viral wart, unspecified: Secondary | ICD-10-CM | POA: Diagnosis not present

## 2012-03-21 DIAGNOSIS — C44319 Basal cell carcinoma of skin of other parts of face: Secondary | ICD-10-CM | POA: Diagnosis not present

## 2012-03-21 DIAGNOSIS — D485 Neoplasm of uncertain behavior of skin: Secondary | ICD-10-CM | POA: Diagnosis not present

## 2012-03-21 DIAGNOSIS — Z85828 Personal history of other malignant neoplasm of skin: Secondary | ICD-10-CM | POA: Diagnosis not present

## 2012-03-23 ENCOUNTER — Encounter: Payer: Self-pay | Admitting: Internal Medicine

## 2012-03-25 ENCOUNTER — Encounter: Payer: Self-pay | Admitting: Internal Medicine

## 2012-03-29 DIAGNOSIS — C44519 Basal cell carcinoma of skin of other part of trunk: Secondary | ICD-10-CM | POA: Diagnosis not present

## 2012-04-24 ENCOUNTER — Telehealth: Payer: Self-pay | Admitting: *Deleted

## 2012-04-24 ENCOUNTER — Encounter: Payer: Self-pay | Admitting: Internal Medicine

## 2012-04-24 DIAGNOSIS — E119 Type 2 diabetes mellitus without complications: Secondary | ICD-10-CM

## 2012-04-24 NOTE — Telephone Encounter (Signed)
Pt is coming in for labs tomorrow 04.23.2014 for labs what labs and dx would you like?  Thank you

## 2012-04-25 ENCOUNTER — Other Ambulatory Visit (INDEPENDENT_AMBULATORY_CARE_PROVIDER_SITE_OTHER): Payer: Medicare Other

## 2012-04-25 ENCOUNTER — Ambulatory Visit: Payer: Self-pay | Admitting: Internal Medicine

## 2012-04-25 DIAGNOSIS — E119 Type 2 diabetes mellitus without complications: Secondary | ICD-10-CM

## 2012-04-25 DIAGNOSIS — Z1231 Encounter for screening mammogram for malignant neoplasm of breast: Secondary | ICD-10-CM | POA: Diagnosis not present

## 2012-04-25 LAB — COMPREHENSIVE METABOLIC PANEL
ALT: 33 U/L (ref 0–35)
Alkaline Phosphatase: 63 U/L (ref 39–117)
Creatinine, Ser: 0.7 mg/dL (ref 0.4–1.2)
Sodium: 139 mEq/L (ref 135–145)
Total Bilirubin: 1 mg/dL (ref 0.3–1.2)
Total Protein: 7 g/dL (ref 6.0–8.3)

## 2012-04-25 LAB — MICROALBUMIN / CREATININE URINE RATIO
Creatinine,U: 41.5 mg/dL
Microalb Creat Ratio: 0.7 mg/g (ref 0.0–30.0)
Microalb, Ur: 0.3 mg/dL (ref 0.0–1.9)

## 2012-04-25 LAB — HEMOGLOBIN A1C: Hgb A1c MFr Bld: 6.2 % (ref 4.6–6.5)

## 2012-04-26 ENCOUNTER — Encounter: Payer: Self-pay | Admitting: Internal Medicine

## 2012-04-26 ENCOUNTER — Ambulatory Visit (INDEPENDENT_AMBULATORY_CARE_PROVIDER_SITE_OTHER): Payer: Medicare Other | Admitting: Internal Medicine

## 2012-04-26 VITALS — BP 122/60 | HR 94 | Temp 98.1°F | Resp 16 | Wt 128.8 lb

## 2012-04-26 DIAGNOSIS — C44319 Basal cell carcinoma of skin of other parts of face: Secondary | ICD-10-CM

## 2012-04-26 DIAGNOSIS — C44311 Basal cell carcinoma of skin of nose: Secondary | ICD-10-CM

## 2012-04-26 DIAGNOSIS — Z79899 Other long term (current) drug therapy: Secondary | ICD-10-CM

## 2012-04-26 DIAGNOSIS — E119 Type 2 diabetes mellitus without complications: Secondary | ICD-10-CM | POA: Diagnosis not present

## 2012-04-26 DIAGNOSIS — E785 Hyperlipidemia, unspecified: Secondary | ICD-10-CM

## 2012-04-26 DIAGNOSIS — D72829 Elevated white blood cell count, unspecified: Secondary | ICD-10-CM

## 2012-04-26 LAB — CBC WITH DIFFERENTIAL/PLATELET
Basophils Absolute: 0 10*3/uL (ref 0.0–0.1)
Eosinophils Relative: 0.7 % (ref 0.0–5.0)
HCT: 41.6 % (ref 36.0–46.0)
Hemoglobin: 14 g/dL (ref 12.0–15.0)
Lymphs Abs: 1.9 10*3/uL (ref 0.7–4.0)
MCV: 96.9 fl (ref 78.0–100.0)
Monocytes Absolute: 1.1 10*3/uL — ABNORMAL HIGH (ref 0.1–1.0)
Neutro Abs: 7.8 10*3/uL — ABNORMAL HIGH (ref 1.4–7.7)
Platelets: 258 10*3/uL (ref 150.0–400.0)
RDW: 13.9 % (ref 11.5–14.6)

## 2012-04-26 NOTE — Patient Instructions (Addendum)
Return in July for your annual exam .  We will do labs and EKG same day (to avoid nonreimbursement by medicare)  You are doing excellent!!!!!!  As an experiment ,  Check your BS before and 2 hours after a [employer] pasta dish

## 2012-04-26 NOTE — Assessment & Plan Note (Addendum)
Etiology unclear, Secondary to MTX vs viral infection.  She is having some symptoms today of an upper respiratory infection versus allergic rhinitis. we will recheck in one month to ensure resolution.

## 2012-04-26 NOTE — Progress Notes (Signed)
Patient ID: Emily Ryan, female   DOB: 1945-03-04, 67 y.o.   MRN: [id number]  Patient Active Problem List  Diagnosis  . Hypertension  . Hyperlipidemia  . Gastritis  . Diverticulosis of sigmoid colon  . Constipation  . Reflux esophagitis  . Guttate psoriasis  . Routine general medical examination at a health care facility  . Diabetes mellitus, type II  . Encounter for long-term (current) use of other medications  . Leukocytosis, unspecified  . Basal cell carcinoma of left side of nose    Subjective:  CC:   Chief Complaint  Patient presents with  . Follow-up    complaint of allergies    HPI:    Emily Abbruzziis a 67 y.o. female who presents  For 3 month follow up on recently diagnosed Type 2 diabetes mellitus, hyperlipidemia.  She has been extremely giligent about modifying her diet and the unnecessary carbohydrates. She has been exercising daily and has lost 13 pounds since her diagnosis in October. She has had multiple hypoglycemic events which occur whenever she uses glipizide even at reduced doses. Percocet has been discontinued and for the last several weeks has been taking only metformin. She has not had any recurrent hypoglycemic episodes. She is frustrated by the fact that her morning fasting glucoses continue to be above 125. However her hemoglobin A1c is currently 6.2.   She is followed by dermatology regularly for psoriasis and was found recently to have a Basal cell carcinoma on left side of her nose.  She has been referred to [facility] to Dr. Caprice Beaver for a Mohs procedure.  She has been having sneezing, running nose, sinus congestion and sinus pain, sore throat for the last 24 hours.  Past Medical History  Diagnosis Date  . Hyperlipidemia   . Hypertension   . Menopausal disorder   . Complicated pregnancy     1st pregnancy complicated by post operative hemorrhage and 2ng complicated by epidural  . Diabetes mellitus   . Diverticulosis of colon (without mention of  hemorrhage)   . Guttate psoriasis     Past Surgical History  Procedure Laterality Date  . Hernia repair    . Varicose vein surgery  1974    right leg   . Tonsillectomy  1953  . Septoplasty  1969  . Abdominal hysterectomy    . Bladder repair  1991    lift   . Rectocele repair    . Cystocele repair    . Partial colectomy  Nov 2012    left, secondary to diverticular perf  . Appendectomy  Dec 2012       The following portions of the patient's history were reviewed and updated as appropriate: Allergies, current medications, and problem list.    Review of Systems:   Patient denies headache, fevers, malaise, unintentional weight loss, skin rash, eye pain, dysphagia,  hemoptysis , cough, dyspnea, wheezing, chest pain, palpitations, orthopnea, edema, abdominal pain, nausea, melena, diarrhea, constipation, flank pain, dysuria, hematuria, urinary  Frequency, nocturia, numbness, tingling, seizures,  Focal weakness, Loss of consciousness,  Tremor, insomnia, depression, anxiety, and suicidal ideation.         History   Social History  . Marital Status: Married    Spouse Name: N/A    Number of Children: N/A  . Years of Education: N/A   Occupational History  . Not on file.   Social History Main Topics  . Smoking status: Never Smoker   . Smokeless tobacco: Never Used  . Alcohol Use:  3.0 oz/week    5 Glasses of wine per week     Comment: Occasional  . Drug Use: No  . Sexually Active: Yes    Birth Control/ Protection: Post-menopausal   Other Topics Concern  . Not on file   Social History Narrative  . No narrative on file    Objective:  BP 122/60  Pulse 94  Temp(Src) 98.1 F (36.7 C) (Oral)  Resp 16  Wt 128 lb 12 oz (58.401 kg)  BMI 25.58 kg/m2  SpO2 97%  General appearance: alert, cooperative and appears stated age Ears: normal TM's and external ear canals both ears Throat: lips, mucosa, and tongue normal; teeth and gums normal Neck: no adenopathy, no  carotid bruit, supple, symmetrical, trachea midline and thyroid not enlarged, symmetric, no tenderness/mass/nodules Back: symmetric, no curvature. ROM normal. No CVA tenderness. Lungs: clear to auscultation bilaterally Heart: regular rate and rhythm, S1, S2 normal, no murmur, click, rub or gallop Abdomen: soft, non-tender; bowel sounds normal; no masses,  no organomegaly Pulses: 2+ and symmetric Skin: Skin color, texture, turgor normal. No rashes or lesions Lymph nodes: Cervical, supraclavicular, and axillary nodes normal.  Assessment and Plan:  Leukocytosis, unspecified Etiology unclear, Secondary to MTX vs viral infection.  She is having some symptoms today of an upper respiratory infection versus allergic rhinitis. we will recheck in one month to ensure resolution.   Diabetes mellitus, type II Now Well-controlled on metformin only. hemoglobin A1c is 6.2 and she has hypoglycemic events if we add any additional medications due to her excellent regimen of dietary restrictions of carbohydrates and daily exercise. S is up-to-date on eye exams and  foot exam is normal.  Normal urine microalbumin to creatinine ratio.  She is on the appropriate medications.  Hyperlipidemia Managed with simvastatin with no adverse effects.  Basal cell carcinoma of left side of nose She has been referred to [facility] dermatologist Caprice Beaver by her local dermatologist  for Mohs procedure.   Updated Medication List Outpatient Encounter Prescriptions as of 04/26/2012  Medication Sig Dispense Refill  . Blood Glucose Monitoring Suppl (PRECISION XTRA) KIT For use two times daily  1 each  1  . Calcium Carb-Cholecalciferol 600-100 MG-UNIT CAPS Take 1 tablet by mouth daily. Two by mouth daily  90 capsule  3  . esomeprazole (NEXIUM) 20 MG capsule Take 1 capsule (20 mg total) by mouth daily before breakfast.  90 capsule  3  . glucose blood (FREESTYLE INSULINX TEST) test strip Use as instructed once daily to check blood sugar  dx 250.00  32 each  1  . glucose blood (PRECISION XTRA TEST STRIPS) test strip Use as instructed to test sugars twice daily  180 each  3  . hydrochlorothiazide (HYDRODIURIL) 12.5 MG tablet Take 1 tablet (12.5 mg total) by mouth daily.  90 tablet  3  . lisinopril (PRINIVIL,ZESTRIL) 20 MG tablet Take 1 tablet (20 mg total) by mouth daily.  90 tablet  3  . loratadine (CLARITIN) 10 MG tablet Take 1 tablet (10 mg total) by mouth daily.  90 tablet  3  . metFORMIN (GLUCOPHAGE XR) 750 MG 24 hr tablet Take 1 tablet (750 mg total) by mouth daily with breakfast.  90 tablet  3  . methotrexate (RHEUMATREX) 2.5 MG tablet Take 15 mg by mouth once a week.      . Multiple Vitamin (MULTIVITAMIN) capsule Take 1 capsule by mouth daily.        . Omega-3 Fatty Acids (FISH OIL) 1000 MG  CAPS Take 1 capsule (1,000 mg total) by mouth daily.  90 capsule  3  . Precision Thin Lancets MISC Use as directed to check sugars twice daily  180 each  3  . simvastatin (ZOCOR) 40 MG tablet Take 1 tablet (40 mg total) by mouth daily.  90 tablet  3  . vitamin E 400 UNIT capsule Take 400 Units by mouth daily.        . Needles & Syringes MISC Minimum of 20 syringes for the Levemir pen (or one full bag)  20 each  0  . [DISCONTINUED] glipiZIDE (GLUCOTROL) 5 MG tablet Take 1 tablet (5 mg total) by mouth 2 (two) times daily before a meal.  180 tablet  3   No facility-administered encounter medications on file as of 04/26/2012.     Orders Placed This Encounter  Procedures  . CBC with Differential  . CBC with Differential    No Follow-up on file.

## 2012-04-27 ENCOUNTER — Encounter: Payer: Self-pay | Admitting: Internal Medicine

## 2012-04-27 ENCOUNTER — Ambulatory Visit (INDEPENDENT_AMBULATORY_CARE_PROVIDER_SITE_OTHER): Payer: Medicare Other | Admitting: Internal Medicine

## 2012-04-27 VITALS — BP 112/68 | HR 102 | Temp 98.3°F | Resp 18 | Wt 130.5 lb

## 2012-04-27 DIAGNOSIS — J45901 Unspecified asthma with (acute) exacerbation: Secondary | ICD-10-CM

## 2012-04-27 MED ORDER — BUDESONIDE-FORMOTEROL FUMARATE 160-4.5 MCG/ACT IN AERO: 2.0000 | INHALATION_SPRAY | Freq: Two times a day (BID) | RESPIRATORY_TRACT | Status: DC

## 2012-04-27 MED ORDER — ALBUTEROL SULFATE HFA 108 (90 BASE) MCG/ACT IN AERS: 2.0000 | INHALATION_SPRAY | Freq: Four times a day (QID) | RESPIRATORY_TRACT | Status: DC | PRN

## 2012-04-27 MED ORDER — MONTELUKAST SODIUM 10 MG PO TABS: 10.0000 mg | ORAL_TABLET | Freq: Every day | ORAL | Status: DC

## 2012-04-27 MED ORDER — ALBUTEROL SULFATE (5 MG/ML) 0.5% IN NEBU
2.5000 mg | INHALATION_SOLUTION | Freq: Once | RESPIRATORY_TRACT | Status: AC
  Administered 2012-04-27: 2.5 mg via RESPIRATORY_TRACT

## 2012-04-27 MED ORDER — PREDNISONE 10 MG PO TABS: ORAL_TABLET | ORAL | Status: DC

## 2012-04-27 MED ORDER — GUAIFENESIN-CODEINE 100-10 MG/5ML PO SYRP: 5.0000 mL | ORAL_SOLUTION | Freq: Three times a day (TID) | ORAL | Status: DC | PRN

## 2012-04-27 MED ORDER — METHYLPREDNISOLONE ACETATE 40 MG/ML IJ SUSP
40.0000 mg | Freq: Once | INTRAMUSCULAR | Status: AC
  Administered 2012-04-27: 40 mg via INTRAMUSCULAR

## 2012-04-27 NOTE — Progress Notes (Signed)
Patient ID: Emily Ryan, female   DOB: Jan 20, 1945, 67 y.o.   MRN: [id number]  Patient Active Problem List  Diagnosis  . Hypertension  . Hyperlipidemia  . Gastritis  . Diverticulosis of sigmoid colon  . Constipation  . Reflux esophagitis  . Guttate psoriasis  . Routine general medical examination at a health care facility  . Diabetes mellitus, type II  . Encounter for long-term (current) use of other medications  . Leukocytosis, unspecified  . Basal cell carcinoma of left side of nose  . Asthma with exacerbation    Subjective:  CC:   Chief Complaint  Patient presents with  . Acute Visit    Wheezing, cough , SOB when lying down.    HPI:   Emily Abbruzziis a 67 y.o. female who presents with recent onset of coughing and wheezing which began yesterday evening and kept her up the entire night. Yesterday when she was seen in the office she was having a mild sore throat and some sneezing that was attributed to allergic rhinitis. Her progression to an asthma exacerbation  occurred last evening and she was worked in today for an acute visit. She denies fevers and chills. She is short of breath and increasing chest tightness.   Past Medical History  Diagnosis Date  . Hyperlipidemia   . Hypertension   . Menopausal disorder   . Complicated pregnancy     1st pregnancy complicated by post operative hemorrhage and 2ng complicated by epidural  . Diabetes mellitus   . Diverticulosis of colon (without mention of hemorrhage)   . Guttate psoriasis     Past Surgical History  Procedure Laterality Date  . Hernia repair    . Varicose vein surgery  1974    right leg   . Tonsillectomy  1953  . Septoplasty  1969  . Abdominal hysterectomy    . Bladder repair  1991    lift   . Rectocele repair    . Cystocele repair    . Partial colectomy  Nov 2012    left, secondary to diverticular perf  . Appendectomy  Dec 2012       The following portions of the patient's history were reviewed  and updated as appropriate: Allergies, current medications, and problem list.    Review of Systems:   Patient denies headache, fevers, malaise, unintentional weight loss, skin rash, eye pain, sinus congestion and sinus pain, sore throat, dysphagia,  hemoptysis ,  chest pain, palpitations, orthopnea, edema, abdominal pain, nausea, melena, diarrhea, constipation, flank pain, dysuria, hematuria, urinary  Frequency, nocturia, numbness, tingling, seizures,  Focal weakness, Loss of consciousness,  Tremor, insomnia, depression, anxiety, and suicidal ideation.        History   Social History  . Marital Status: Married    Spouse Name: N/A    Number of Children: N/A  . Years of Education: N/A   Occupational History  . Not on file.   Social History Main Topics  . Smoking status: Never Smoker   . Smokeless tobacco: Never Used  . Alcohol Use: 3.0 oz/week    5 Glasses of wine per week     Comment: Occasional  . Drug Use: No  . Sexually Active: Yes    Birth Control/ Protection: Post-menopausal   Other Topics Concern  . Not on file   Social History Narrative  . No narrative on file    Objective:  BP 112/68  Pulse 102  Temp(Src) 98.3 F (36.8 C) (Oral)  Resp 18  Wt 130 lb 8 oz (59.194 kg)  BMI 25.93 kg/m2  SpO2 97%  General appearance: tachypneic, alert, cooperative and appears stated age Ears: normal TM's and external ear canals both ears Throat: lips, mucosa, and tongue normal; teeth and gums normal Neck: no adenopathy, no carotid bruit, supple, symmetrical, trachea midline and thyroid not enlarged, symmetric, no tenderness/mass/nodules Back: symmetric, no curvature. ROM normal. No CVA tenderness. Lungs: clear to auscultation bilaterally with expiratory wheezing Heart: regular rate and rhythm, S1, S2 normal, no murmur, click, rub or gallop Abdomen: soft, non-tender; bowel sounds normal; no masses,  no organomegaly Pulses: 2+ and symmetric Skin: Skin color, texture, turgor  normal. No rashes or lesions Lymph nodes: Cervical, supraclavicular, and axillary nodes normal.  Assessment and Plan:  Asthma with exacerbation With wheezing and recurrent nonproductive cough as primary symptom, without fever or hypoxia on exam. She was given an albuterol nebulizer and IM Depo Medrom 40 mg injection  For acute broncospasm.  Lung exam improved after therapy.  Steroid taper, albuterol MDI and symbicort 160/4.5 inhaler (sample containing 2 weeks of medication) wwas prescribed. Patient instructed to call 911 or go to ER if she developed worsening chest tightness or shortness of breath.    Updated Medication List Outpatient Encounter Prescriptions as of 04/27/2012  Medication Sig Dispense Refill  . Blood Glucose Monitoring Suppl (PRECISION XTRA) KIT For use two times daily  1 each  1  . Calcium Carb-Cholecalciferol 600-100 MG-UNIT CAPS Take 1 tablet by mouth daily. Two by mouth daily  90 capsule  3  . esomeprazole (NEXIUM) 20 MG capsule Take 1 capsule (20 mg total) by mouth daily before breakfast.  90 capsule  3  . folic acid (FOLVITE) 1 MG tablet Take 1 mg by mouth daily.      Marland Kitchen glucose blood (FREESTYLE INSULINX TEST) test strip Use as instructed once daily to check blood sugar dx 250.00  32 each  1  . glucose blood (PRECISION XTRA TEST STRIPS) test strip Use as instructed to test sugars twice daily  180 each  3  . hydrochlorothiazide (HYDRODIURIL) 12.5 MG tablet Take 1 tablet (12.5 mg total) by mouth daily.  90 tablet  3  . lisinopril (PRINIVIL,ZESTRIL) 20 MG tablet Take 1 tablet (20 mg total) by mouth daily.  90 tablet  3  . loratadine (CLARITIN) 10 MG tablet Take 1 tablet (10 mg total) by mouth daily.  90 tablet  3  . metFORMIN (GLUCOPHAGE XR) 750 MG 24 hr tablet Take 1 tablet (750 mg total) by mouth daily with breakfast.  90 tablet  3  . methotrexate (RHEUMATREX) 2.5 MG tablet Take 15 mg by mouth once a week.      . Multiple Vitamin (MULTIVITAMIN) capsule Take 1 capsule by  mouth daily.        . Omega-3 Fatty Acids (FISH OIL) 1000 MG CAPS Take 1 capsule (1,000 mg total) by mouth daily.  90 capsule  3  . Precision Thin Lancets MISC Use as directed to check sugars twice daily  180 each  3  . simvastatin (ZOCOR) 40 MG tablet Take 1 tablet (40 mg total) by mouth daily.  90 tablet  3  . vitamin E 400 UNIT capsule Take 400 Units by mouth daily.        Marland Kitchen albuterol (PROVENTIL HFA;VENTOLIN HFA) 108 (90 BASE) MCG/ACT inhaler Inhale 2 puffs into the lungs every 6 (six) hours as needed for wheezing.  1 Inhaler  5  . budesonide-formoterol (SYMBICORT) 160-4.5 MCG/ACT  inhaler Inhale 2 puffs into the lungs 2 (two) times daily.  1 Inhaler  12  . guaiFENesin-codeine (ROBITUSSIN AC) 100-10 MG/5ML syrup Take 5 mLs by mouth 3 (three) times daily as needed for cough.  180 mL  0  . montelukast (SINGULAIR) 10 MG tablet Take 1 tablet (10 mg total) by mouth at bedtime.  30 tablet  3  . Needles & Syringes MISC Minimum of 20 syringes for the Levemir pen (or one full bag)  20 each  0  . predniSONE (DELTASONE) 10 MG tablet 6 tablets daily for 3 days, then decrease by 1 tablet daily until gone (*8 days)  33 tablet  0  . [DISCONTINUED] glipiZIDE (GLUCOTROL) 5 MG tablet Take 1 tablet (5 mg total) by mouth 2 (two) times daily before a meal.  180 tablet  3  . [EXPIRED] albuterol (PROVENTIL) (5 MG/ML) 0.5% nebulizer solution 2.5 mg       . [EXPIRED] methylPREDNISolone acetate (DEPO-MEDROL) injection 40 mg        No facility-administered encounter medications on file as of 04/27/2012.     No orders of the defined types were placed in this encounter.    No Follow-up on file.

## 2012-04-27 NOTE — Patient Instructions (Addendum)
You are having an asthma exacerbation .    You need to take a prednisone regimen for the next 8 days,  Starting with 60 mg daily for 3 days,  Then tapering by 10 mg daily until gone.  The tablets are 10 mg each, so your regimen is:  6 tablets each day for 3 days (days 1 to 3)  5 tablets  On day 4 4 tablets on Day 5 Etc  You need ot use the symbicort inhaler 2 puffs twice daily (12 hours apart) for the next two weeks (sample inhalher lasts 2 weeks) Use the pro air inhaler in between if needed for wheezing Singular once daily to control allergies and help your claritin  Cough syrup as needed,  It has codeine in it   Stay inside until the pollen is gone, or use a mask when going outside  If your breathing gets worse,  Call 911 or go to the ER

## 2012-04-28 ENCOUNTER — Encounter: Payer: Self-pay | Admitting: Internal Medicine

## 2012-04-28 DIAGNOSIS — J45901 Unspecified asthma with (acute) exacerbation: Secondary | ICD-10-CM | POA: Insufficient documentation

## 2012-04-28 DIAGNOSIS — C44311 Basal cell carcinoma of skin of nose: Secondary | ICD-10-CM | POA: Insufficient documentation

## 2012-04-28 NOTE — Assessment & Plan Note (Signed)
She has been referred to [facility] dermatologist Caprice Beaver by her local dermatologist  for Mohs procedure.

## 2012-04-28 NOTE — Assessment & Plan Note (Addendum)
With wheezing and recurrent nonproductive cough as primary symptom, without fever or hypoxia on exam. Trigger appears to have been recen tprlonged outside exposure to pollen. She was given an albuterol nebulizer and IM Depo Medrol 40 mg injection,in clinic for management of acute broncospasm.  Lung exam improved after therapy.  Steroid taper, albuterol MDI , singulair rxs sent to pharmacy and symbicort 160/4.5 inhaler (sample containing 2 weeks of medication) was prescribed. Cheratussin cough syrup with codeine prescribed for cough.Patient instructed to use vicodin if needed for  Additional cough suppression and to call 911 or go to ER if she developed worsening chest tightness or shortness of breath. She will return on Monday for recheck.

## 2012-04-28 NOTE — Assessment & Plan Note (Signed)
Now Well-controlled on metformin only. hemoglobin A1c is 6.2 and she has hypoglycemic events if we add any additional medications due to her excellent regimen of dietary restrictions of carbohydrates and daily exercise. S is up-to-date on eye exams and  foot exam is normal.  Normal urine microalbumin to creatinine ratio.  She is on the appropriate medications.

## 2012-04-28 NOTE — Assessment & Plan Note (Signed)
Managed with simvastatin with no adverse effects.

## 2012-05-02 ENCOUNTER — Ambulatory Visit: Payer: Medicare Other | Admitting: Internal Medicine

## 2012-05-03 DIAGNOSIS — H40009 Preglaucoma, unspecified, unspecified eye: Secondary | ICD-10-CM | POA: Diagnosis not present

## 2012-05-04 ENCOUNTER — Encounter: Payer: Self-pay | Admitting: Internal Medicine

## 2012-05-04 ENCOUNTER — Ambulatory Visit (INDEPENDENT_AMBULATORY_CARE_PROVIDER_SITE_OTHER): Payer: Medicare Other | Admitting: Internal Medicine

## 2012-05-04 VITALS — BP 118/58 | HR 83 | Temp 98.1°F | Resp 16 | Wt 126.5 lb

## 2012-05-04 DIAGNOSIS — J45909 Unspecified asthma, uncomplicated: Secondary | ICD-10-CM

## 2012-05-04 DIAGNOSIS — J45901 Unspecified asthma with (acute) exacerbation: Secondary | ICD-10-CM | POA: Diagnosis not present

## 2012-05-04 NOTE — Patient Instructions (Addendum)
Continue symbicort inhaler 2 puffs twice daily for 3 more weeks.  If your breathing gets worse once you get off of the prednisone, let me know and we will resume the prednisone and make an appt with Dr Kendrick Fries  Pulmonary function tests to be scheduled in the next few weeks to  See if your lungs are suffering from true asthma

## 2012-05-05 ENCOUNTER — Encounter: Payer: Self-pay | Admitting: Internal Medicine

## 2012-05-06 ENCOUNTER — Encounter: Payer: Self-pay | Admitting: Internal Medicine

## 2012-05-06 NOTE — Assessment & Plan Note (Signed)
Improved clinically with prednisone and inhaled bronchodilators.  Continue Symbicort 2 puffs twice daily for two more weeks and daily singulair.  If symptoms worsen after prednisone is stopped, will see again.  Pulmonary function tests ordered .

## 2012-05-06 NOTE — Progress Notes (Signed)
Patient ID: Emily Ryan, female   DOB: 1945-01-27, 67 y.o.   MRN: [id number]  Patient Active Problem List   Diagnosis Date Noted  . Basal cell carcinoma of left side of nose 04/28/2012  . Asthma with exacerbation 04/28/2012  . Leukocytosis, unspecified 04/26/2012  . Diabetes mellitus, type II 10/21/2011  . Encounter for long-term (current) use of other medications 10/21/2011  . Routine general medical examination at a health care facility 07/18/2011  . Guttate psoriasis 04/04/2011  . Constipation 01/19/2011  . Reflux esophagitis 01/19/2011  . Diverticulosis of sigmoid colon 11/10/2010  . Gastritis 09/09/2010  . Hypertension 09/07/2010  . Hyperlipidemia 09/07/2010    Subjective:  CC:   Chief Complaint  Patient presents with  . Follow-up    HPI:   Emily Ryan a 67 y.o. female who presents One week follow up on asthma exacerbation treated with inhaled bronchodilators and steroids and systemic steroid taper.  Patient had improvement in symptoms without need for ER visit and  feels much better today. The cough and wheezing have resolved.  She has a history of asthma as a child with some environmental exposure to passive smoke from both parents but has not had an asthma exacerbation in over a year. No prior formal testing. Physically very active,  Symptoms started after working in the yard. .    Past Medical History  Diagnosis Date  . Hyperlipidemia   . Hypertension   . Menopausal disorder   . Complicated pregnancy     1st pregnancy complicated by post operative hemorrhage and 2ng complicated by epidural  . Diabetes mellitus   . Diverticulosis of colon (without mention of hemorrhage)   . Guttate psoriasis     Past Surgical History  Procedure Laterality Date  . Hernia repair    . Varicose vein surgery  1974    right leg   . Tonsillectomy  1953  . Septoplasty  1969  . Abdominal hysterectomy    . Bladder repair  1991    lift   . Rectocele repair    . Cystocele  repair    . Partial colectomy  Nov 2012    left, secondary to diverticular perf  . Appendectomy  Dec 2012       The following portions of the patient's history were reviewed and updated as appropriate: Allergies, current medications, and problem list.    Review of Systems:   Patient denies headache, fevers, malaise, unintentional weight loss, skin rash, eye pain, sinus congestion and sinus pain, sore throat, dysphagia,  hemoptysis , chest pain, palpitations, orthopnea, edema, abdominal pain, nausea, melena, diarrhea, constipation, flank pain, dysuria, hematuria, urinary  Frequency, nocturia, numbness, tingling, seizures,  Focal weakness, Loss of consciousness,  Tremor, insomnia, depression, anxiety, and suicidal ideation.       History   Social History  . Marital Status: Married    Spouse Name: N/A    Number of Children: N/A  . Years of Education: N/A   Occupational History  . Not on file.   Social History Main Topics  . Smoking status: Never Smoker   . Smokeless tobacco: Never Used  . Alcohol Use: 3.0 oz/week    5 Glasses of wine per week     Comment: Occasional  . Drug Use: No  . Sexually Active: Yes    Birth Control/ Protection: Post-menopausal   Other Topics Concern  . Not on file   Social History Narrative  . No narrative on file    Objective:  BP  118/58  Pulse 83  Temp(Src) 98.1 F (36.7 C) (Oral)  Resp 16  Wt 126 lb 8 oz (57.38 kg)  BMI 25.13 kg/m2  SpO2 95%  General appearance: alert, cooperative and appears stated age Ears: normal TM's and external ear canals both ears Throat: lips, mucosa, and tongue normal; teeth and gums normal Neck: no adenopathy, no carotid bruit, supple, symmetrical, trachea midline and thyroid not enlarged, symmetric, no tenderness/mass/nodules Back: symmetric, no curvature. ROM normal. No CVA tenderness. Lungs: clear to auscultation bilaterally Heart: regular rate and rhythm, S1, S2 normal, no murmur, click, rub or  gallop Abdomen: soft, non-tender; bowel sounds normal; no masses,  no organomegaly Pulses: 2+ and symmetric Skin: Skin color, texture, turgor normal. No rashes or lesions Lymph nodes: Cervical, supraclavicular, and axillary nodes normal.  Assessment and Plan:  Asthma with exacerbation Improved clinically with prednisone and inhaled bronchodilators.  Continue Symbicort 2 puffs twice daily for two more weeks and daily singulair.  If symptoms worsen after prednisone is stopped, will see again.  Pulmonary function tests ordered .   A total of 25 minutes of face to face time was spent with patient more than half of which was spent in counselling and coordination of care   Updated Medication List Outpatient Encounter Prescriptions as of 05/04/2012  Medication Sig Dispense Refill  . albuterol (PROVENTIL HFA;VENTOLIN HFA) 108 (90 BASE) MCG/ACT inhaler Inhale 2 puffs into the lungs every 6 (six) hours as needed for wheezing.  1 Inhaler  5  . Blood Glucose Monitoring Suppl (PRECISION XTRA) KIT For use two times daily  1 each  1  . budesonide-formoterol (SYMBICORT) 160-4.5 MCG/ACT inhaler Inhale 2 puffs into the lungs 2 (two) times daily.  1 Inhaler  12  . Calcium Carb-Cholecalciferol 600-100 MG-UNIT CAPS Take 1 tablet by mouth daily. Two by mouth daily  90 capsule  3  . esomeprazole (NEXIUM) 20 MG capsule Take 1 capsule (20 mg total) by mouth daily before breakfast.  90 capsule  3  . folic acid (FOLVITE) 1 MG tablet Take 1 mg by mouth daily.      Marland Kitchen glucose blood (FREESTYLE INSULINX TEST) test strip Use as instructed once daily to check blood sugar dx 250.00  32 each  1  . glucose blood (PRECISION XTRA TEST STRIPS) test strip Use as instructed to test sugars twice daily  180 each  3  . guaiFENesin-codeine (ROBITUSSIN AC) 100-10 MG/5ML syrup Take 5 mLs by mouth 3 (three) times daily as needed for cough.  180 mL  0  . hydrochlorothiazide (HYDRODIURIL) 12.5 MG tablet Take 1 tablet (12.5 mg total) by mouth  daily.  90 tablet  3  . lisinopril (PRINIVIL,ZESTRIL) 20 MG tablet Take 1 tablet (20 mg total) by mouth daily.  90 tablet  3  . loratadine (CLARITIN) 10 MG tablet Take 1 tablet (10 mg total) by mouth daily.  90 tablet  3  . metFORMIN (GLUCOPHAGE XR) 750 MG 24 hr tablet Take 1 tablet (750 mg total) by mouth daily with breakfast.  90 tablet  3  . methotrexate (RHEUMATREX) 2.5 MG tablet Take 15 mg by mouth once a week.      . montelukast (SINGULAIR) 10 MG tablet Take 1 tablet (10 mg total) by mouth at bedtime.  30 tablet  3  . Multiple Vitamin (MULTIVITAMIN) capsule Take 1 capsule by mouth daily.        . Needles & Syringes MISC Minimum of 20 syringes for the Levemir pen (or one  full bag)  20 each  0  . Omega-3 Fatty Acids (FISH OIL) 1000 MG CAPS Take 1 capsule (1,000 mg total) by mouth daily.  90 capsule  3  . Precision Thin Lancets MISC Use as directed to check sugars twice daily  180 each  3  . predniSONE (DELTASONE) 10 MG tablet 6 tablets daily for 3 days, then decrease by 1 tablet daily until gone (*8 days)  33 tablet  0  . simvastatin (ZOCOR) 40 MG tablet Take 1 tablet (40 mg total) by mouth daily.  90 tablet  3  . vitamin E 400 UNIT capsule Take 400 Units by mouth daily.         No facility-administered encounter medications on file as of 05/04/2012.     Orders Placed This Encounter  Procedures  . Pulmonary function test    No Follow-up on file.

## 2012-05-10 ENCOUNTER — Ambulatory Visit: Payer: Self-pay | Admitting: Internal Medicine

## 2012-05-10 DIAGNOSIS — J45909 Unspecified asthma, uncomplicated: Secondary | ICD-10-CM | POA: Diagnosis not present

## 2012-05-10 DIAGNOSIS — R0602 Shortness of breath: Secondary | ICD-10-CM | POA: Diagnosis not present

## 2012-05-11 LAB — PULMONARY FUNCTION TEST

## 2012-05-17 ENCOUNTER — Telehealth: Payer: Self-pay | Admitting: Internal Medicine

## 2012-05-17 ENCOUNTER — Encounter: Payer: Self-pay | Admitting: Internal Medicine

## 2012-05-17 DIAGNOSIS — J45901 Unspecified asthma with (acute) exacerbation: Secondary | ICD-10-CM

## 2012-05-17 DIAGNOSIS — J45909 Unspecified asthma, uncomplicated: Secondary | ICD-10-CM

## 2012-05-17 DIAGNOSIS — C44319 Basal cell carcinoma of skin of other parts of face: Secondary | ICD-10-CM | POA: Diagnosis not present

## 2012-05-17 DIAGNOSIS — Z85828 Personal history of other malignant neoplasm of skin: Secondary | ICD-10-CM | POA: Diagnosis not present

## 2012-05-17 DIAGNOSIS — L905 Scar conditions and fibrosis of skin: Secondary | ICD-10-CM | POA: Diagnosis not present

## 2012-05-17 DIAGNOSIS — L908 Other atrophic disorders of skin: Secondary | ICD-10-CM | POA: Diagnosis not present

## 2012-05-17 DIAGNOSIS — L918 Other hypertrophic disorders of the skin: Secondary | ICD-10-CM | POA: Diagnosis not present

## 2012-05-17 NOTE — Assessment & Plan Note (Signed)
Recommended continued use of Symbicort and referral to [facility] Pulmonology in process.

## 2012-05-18 ENCOUNTER — Encounter: Payer: Self-pay | Admitting: Emergency Medicine

## 2012-05-21 ENCOUNTER — Encounter: Payer: Self-pay | Admitting: Internal Medicine

## 2012-05-24 LAB — HM DIABETES EYE EXAM

## 2012-05-30 ENCOUNTER — Encounter: Payer: Self-pay | Admitting: Internal Medicine

## 2012-05-31 ENCOUNTER — Other Ambulatory Visit: Payer: Self-pay | Admitting: Internal Medicine

## 2012-05-31 DIAGNOSIS — E119 Type 2 diabetes mellitus without complications: Secondary | ICD-10-CM

## 2012-05-31 NOTE — Telephone Encounter (Signed)
I see you have a CBC scheduled is there any other labs before her appointment you would like. I will call and set up lab appointment before OV.

## 2012-06-05 ENCOUNTER — Encounter: Payer: Self-pay | Admitting: Pulmonary Disease

## 2012-06-05 ENCOUNTER — Ambulatory Visit (INDEPENDENT_AMBULATORY_CARE_PROVIDER_SITE_OTHER): Payer: Medicare Other | Admitting: Pulmonary Disease

## 2012-06-05 VITALS — BP 126/70 | HR 80 | Temp 98.5°F | Ht 59.5 in | Wt 127.0 lb

## 2012-06-05 DIAGNOSIS — J4531 Mild persistent asthma with (acute) exacerbation: Secondary | ICD-10-CM

## 2012-06-05 DIAGNOSIS — J45901 Unspecified asthma with (acute) exacerbation: Secondary | ICD-10-CM

## 2012-06-05 NOTE — Patient Instructions (Signed)
Stop symbicort Start Asmanex one puff twice a day  We will see you back in 3-4 weeks

## 2012-06-05 NOTE — Assessment & Plan Note (Signed)
Emily Ryan appears to have recovered from the cough and wheezing from which she was suffering several weeks ago. I am encouraged by the fact that her lung function tests were completely normal recently.  I explained to her that it is entirely possible that her cough, shortness of breath, and wheezing could have been caused by a bad episode of allergic rhinitis related to all the pollen that's been out lately. However, given the severity of wheezing she was experiencing and her response to steroids and bronchodilators it is reasonable to evaluate her further for asthma.  Plan: -Stop Symbicort -Start Asmanex one puff twice a day -Followup with me in 3-4 weeks -If stable on followup then stop inhaled corticosteroid in order methacholine challenge

## 2012-06-05 NOTE — Progress Notes (Signed)
Subjective:    Patient ID: Emily Ryan, female    DOB: 01-23-1945, 67 y.o.   MRN: [id number]  HPI  This is a very pleasant 66 year old female with a possible diagnosis of childhood asthma who comes to our clinic today for evaluation of adult onset asthma. She has lived in [place] for 15 years but before that lived in [place]. As a child she had recurrent cough and wheeze and bronchitis and was told that she likely had asthma. However, her parents smoked in the home and she noted that when she moved out of the home at the time of her marriage her symptoms resolved instantly. She herself never smoked cigarettes.  Approximately 10 years ago she had an episode of shortness of breath and cough which required an ER visit. She was treated with antibiotics and recovered well. She was told several years ago that she might have asthma. She is also been told that she has exercise-induced asthma.  In April of 2014 she developed the sudden onset of shortness of breath, cough, and wheezing after she had been playing outside with her grandchildren 2 days earlier. This was associated with sinus congestion, runny nose, and sneezing. She saw her primary care physician (Dr. Darrick Huntsman) who ordered prednisone, Symbicort, albuterol, and cough suppressants. She stated that these medications worked nearly instantaneously and her cough has since resolved. She now currently has no respiratory complaints and states that she has not felt short of breath since that visit. She continues to use Symbicort on a regular basis. She has not had to use albuterol at all the last several weeks.  She walks regularly without difficulty. She states that she might feel some shortness of breath after 30 minutes of walking or she walks with friends or family that her walking at a very brisk pace.  Past Medical History  Diagnosis Date  . Hyperlipidemia   . Hypertension   . Menopausal disorder   . Complicated pregnancy     1st  pregnancy complicated by post operative hemorrhage and 2ng complicated by epidural  . Diabetes mellitus   . Diverticulosis of colon (without mention of hemorrhage)   . Guttate psoriasis      Family History  Problem Relation Age of Onset  . Diabetes Sister   . Cancer Maternal Grandmother     colon ca     History   Social History  . Marital Status: Married    Spouse Name: N/A    Number of Children: N/A  . Years of Education: N/A   Occupational History  . Not on file.   Social History Main Topics  . Smoking status: Never Smoker   . Smokeless tobacco: Never Used  . Alcohol Use: 3.0 oz/week    5 Glasses of wine per week     Comment: Occasional  . Drug Use: No  . Sexually Active: Yes    Birth Control/ Protection: Post-menopausal   Other Topics Concern  . Not on file   Social History Narrative  . No narrative on file     No Known Allergies   Outpatient Prescriptions Prior to Visit  Medication Sig Dispense Refill  . albuterol (PROVENTIL HFA;VENTOLIN HFA) 108 (90 BASE) MCG/ACT inhaler Inhale 2 puffs into the lungs every 6 (six) hours as needed for wheezing.  1 Inhaler  5  . Blood Glucose Monitoring Suppl (PRECISION XTRA) KIT For use two times daily  1 each  1  . budesonide-formoterol (SYMBICORT) 160-4.5 MCG/ACT inhaler Inhale 2 puffs  into the lungs 2 (two) times daily.  1 Inhaler  12  . Calcium Carb-Cholecalciferol 600-100 MG-UNIT CAPS Take 1 tablet by mouth daily. Two by mouth daily  90 capsule  3  . esomeprazole (NEXIUM) 20 MG capsule Take 1 capsule (20 mg total) by mouth daily before breakfast.  90 capsule  3  . folic acid (FOLVITE) 1 MG tablet Take 1 mg by mouth daily.      Marland Kitchen glucose blood (FREESTYLE INSULINX TEST) test strip Use as instructed once daily to check blood sugar dx 250.00  32 each  1  . glucose blood (PRECISION XTRA TEST STRIPS) test strip Use as instructed to test sugars twice daily  180 each  3  . guaiFENesin-codeine (ROBITUSSIN AC) 100-10 MG/5ML syrup  Take 5 mLs by mouth 3 (three) times daily as needed for cough.  180 mL  0  . hydrochlorothiazide (HYDRODIURIL) 12.5 MG tablet Take 1 tablet (12.5 mg total) by mouth daily.  90 tablet  3  . lisinopril (PRINIVIL,ZESTRIL) 20 MG tablet Take 1 tablet (20 mg total) by mouth daily.  90 tablet  3  . loratadine (CLARITIN) 10 MG tablet Take 1 tablet (10 mg total) by mouth daily.  90 tablet  3  . metFORMIN (GLUCOPHAGE XR) 750 MG 24 hr tablet Take 1 tablet (750 mg total) by mouth daily with breakfast.  90 tablet  3  . methotrexate (RHEUMATREX) 2.5 MG tablet Take 15 mg by mouth once a week.      . montelukast (SINGULAIR) 10 MG tablet Take 1 tablet (10 mg total) by mouth at bedtime.  30 tablet  3  . Multiple Vitamin (MULTIVITAMIN) capsule Take 1 capsule by mouth daily.        . Needles & Syringes MISC Minimum of 20 syringes for the Levemir pen (or one full bag)  20 each  0  . Omega-3 Fatty Acids (FISH OIL) 1000 MG CAPS Take 1 capsule (1,000 mg total) by mouth daily.  90 capsule  3  . Precision Thin Lancets MISC Use as directed to check sugars twice daily  180 each  3  . simvastatin (ZOCOR) 40 MG tablet Take 1 tablet (40 mg total) by mouth daily.  90 tablet  3  . vitamin E 400 UNIT capsule Take 400 Units by mouth daily.        . predniSONE (DELTASONE) 10 MG tablet 6 tablets daily for 3 days, then decrease by 1 tablet daily until gone (*8 days)  33 tablet  0   No facility-administered medications prior to visit.      Review of Systems  Constitutional: Negative for fever and unexpected weight change.  HENT: Negative for ear pain, nosebleeds, congestion, sore throat, rhinorrhea, sneezing, trouble swallowing, dental problem, postnasal drip and sinus pressure.   Eyes: Negative for redness and itching.  Respiratory: Positive for cough, shortness of breath and wheezing. Negative for chest tightness.   Cardiovascular: Negative for palpitations and leg swelling.  Gastrointestinal: Negative for nausea and vomiting.   Genitourinary: Negative for dysuria.  Musculoskeletal: Negative for joint swelling.  Skin: Negative for rash.  Neurological: Negative for headaches.  Hematological: Does not bruise/bleed easily.  Psychiatric/Behavioral: Negative for dysphoric mood. The patient is not nervous/anxious.        Objective:   Physical Exam  Filed Vitals:   06/05/12 1525  BP: 126/70  Pulse: 80  Temp: 98.5 F (36.9 C)  TempSrc: Oral  Height: 4' 11.5" (1.511 m)  Weight: 127 lb (57.607  kg)  SpO2: 98%   Gen: well appearing, no acute distress HEENT: NCAT, PERRL, EOMi, OP clear, neck supple without masses PULM: CTA B CV: RRR, no mgr, no JVD AB: BS+, soft, nontender, no hsm Ext: warm, no edema, no clubbing, no cyanosis Derm: no rash or skin breakdown Neuro: A&Ox4, CN II-XII intact, strength 5/5 in all 4 extremities       Assessment & Plan:   Asthma Aivy appears to have recovered from the cough and wheezing from which she was suffering several weeks ago. I am encouraged by the fact that her lung function tests were completely normal recently.  I explained to her that it is entirely possible that her cough, shortness of breath, and wheezing could have been caused by a bad episode of allergic rhinitis related to all the pollen that's been out lately. However, given the severity of wheezing she was experiencing and her response to steroids and bronchodilators it is reasonable to evaluate her further for asthma.  Plan: -Stop Symbicort -Start Asmanex one puff twice a day -Followup with me in 3-4 weeks -If stable on followup then stop inhaled corticosteroid in order methacholine challenge   Updated Medication List Outpatient Encounter Prescriptions as of 06/05/2012  Medication Sig Dispense Refill  . albuterol (PROVENTIL HFA;VENTOLIN HFA) 108 (90 BASE) MCG/ACT inhaler Inhale 2 puffs into the lungs every 6 (six) hours as needed for wheezing.  1 Inhaler  5  . Blood Glucose Monitoring Suppl (PRECISION  XTRA) KIT For use two times daily  1 each  1  . budesonide-formoterol (SYMBICORT) 160-4.5 MCG/ACT inhaler Inhale 2 puffs into the lungs 2 (two) times daily.  1 Inhaler  12  . Calcium Carb-Cholecalciferol 600-100 MG-UNIT CAPS Take 1 tablet by mouth daily. Two by mouth daily  90 capsule  3  . esomeprazole (NEXIUM) 20 MG capsule Take 1 capsule (20 mg total) by mouth daily before breakfast.  90 capsule  3  . folic acid (FOLVITE) 1 MG tablet Take 1 mg by mouth daily.      Marland Kitchen glucose blood (FREESTYLE INSULINX TEST) test strip Use as instructed once daily to check blood sugar dx 250.00  32 each  1  . glucose blood (PRECISION XTRA TEST STRIPS) test strip Use as instructed to test sugars twice daily  180 each  3  . guaiFENesin-codeine (ROBITUSSIN AC) 100-10 MG/5ML syrup Take 5 mLs by mouth 3 (three) times daily as needed for cough.  180 mL  0  . hydrochlorothiazide (HYDRODIURIL) 12.5 MG tablet Take 1 tablet (12.5 mg total) by mouth daily.  90 tablet  3  . lisinopril (PRINIVIL,ZESTRIL) 20 MG tablet Take 1 tablet (20 mg total) by mouth daily.  90 tablet  3  . loratadine (CLARITIN) 10 MG tablet Take 1 tablet (10 mg total) by mouth daily.  90 tablet  3  . metFORMIN (GLUCOPHAGE XR) 750 MG 24 hr tablet Take 1 tablet (750 mg total) by mouth daily with breakfast.  90 tablet  3  . methotrexate (RHEUMATREX) 2.5 MG tablet Take 15 mg by mouth once a week.      . montelukast (SINGULAIR) 10 MG tablet Take 1 tablet (10 mg total) by mouth at bedtime.  30 tablet  3  . Multiple Vitamin (MULTIVITAMIN) capsule Take 1 capsule by mouth daily.        . Needles & Syringes MISC Minimum of 20 syringes for the Levemir pen (or one full bag)  20 each  0  . Omega-3 Fatty Acids (  FISH OIL) 1000 MG CAPS Take 1 capsule (1,000 mg total) by mouth daily.  90 capsule  3  . Precision Thin Lancets MISC Use as directed to check sugars twice daily  180 each  3  . simvastatin (ZOCOR) 40 MG tablet Take 1 tablet (40 mg total) by mouth daily.  90 tablet   3  . vitamin E 400 UNIT capsule Take 400 Units by mouth daily.        . [DISCONTINUED] predniSONE (DELTASONE) 10 MG tablet 6 tablets daily for 3 days, then decrease by 1 tablet daily until gone (*8 days)  33 tablet  0   No facility-administered encounter medications on file as of 06/05/2012.

## 2012-06-21 ENCOUNTER — Encounter: Payer: Self-pay | Admitting: Internal Medicine

## 2012-06-21 DIAGNOSIS — D485 Neoplasm of uncertain behavior of skin: Secondary | ICD-10-CM | POA: Diagnosis not present

## 2012-06-21 DIAGNOSIS — Z85828 Personal history of other malignant neoplasm of skin: Secondary | ICD-10-CM | POA: Diagnosis not present

## 2012-06-21 DIAGNOSIS — L408 Other psoriasis: Secondary | ICD-10-CM | POA: Diagnosis not present

## 2012-06-25 NOTE — Telephone Encounter (Signed)
Labs faxed as requested

## 2012-06-26 ENCOUNTER — Ambulatory Visit (INDEPENDENT_AMBULATORY_CARE_PROVIDER_SITE_OTHER): Payer: Medicare Other | Admitting: Pulmonary Disease

## 2012-06-26 ENCOUNTER — Encounter: Payer: Self-pay | Admitting: Pulmonary Disease

## 2012-06-26 VITALS — BP 134/74 | HR 71 | Temp 97.9°F | Ht 59.5 in | Wt 129.1 lb

## 2012-06-26 DIAGNOSIS — J45901 Unspecified asthma with (acute) exacerbation: Secondary | ICD-10-CM

## 2012-06-26 DIAGNOSIS — J4521 Mild intermittent asthma with (acute) exacerbation: Secondary | ICD-10-CM

## 2012-06-26 DIAGNOSIS — J45909 Unspecified asthma, uncomplicated: Secondary | ICD-10-CM | POA: Diagnosis not present

## 2012-06-26 NOTE — Assessment & Plan Note (Signed)
Emily Ryan is doing well on Asmanex alone.  Again, I question a diagnosis of asthma and feel that most of her symptoms are explained by allergic rhinitis and annual infectious bronchitis.  Plan: -d/c Asmanex -methacholine challenge -if methacholine challenge test is positive, then use Asmanex in cold weather months

## 2012-06-26 NOTE — Patient Instructions (Signed)
We will schedule the methacholine challenge test and call you with the time and date  Stop taking the Asmanex  Keep taking the singulair and the claritin daily  We will see you back in 6 months or sooner if needed

## 2012-06-26 NOTE — Progress Notes (Signed)
Subjective:    Patient ID: Emily Ryan, female    DOB: October 05, 1945, 67 y.o.   MRN: [id number]  Synopsis: Ms. Semidey first saw the [facility] in 06/2012 for evaluation of annual bronchitis, allergic rhinitis and possible asthma.  Simple spirometry was normal. She did well with step down therapy from Symbicort to Asmanex.  HPI  06/26/2012 ROV >> Debrah has done well with step down to Asmanex from the symbicort. She has not had cough, shortness of breath, or had to use her albuterol inhaler.  Her sinus symptoms are minimal.  She denies fevers, chills, or other changes.  In general, she feels like she is doing very well.   Past Medical History  Diagnosis Date  . Hyperlipidemia   . Hypertension   . Menopausal disorder   . Complicated pregnancy     1st pregnancy complicated by post operative hemorrhage and 2ng complicated by epidural  . Diabetes mellitus   . Diverticulosis of colon (without mention of hemorrhage)   . Guttate psoriasis      Review of Systems     Objective:   Physical Exam  Filed Vitals:   06/26/12 0925  BP: 134/74  Pulse: 71  Temp: 97.9 F (36.6 C)  TempSrc: Oral  Height: 4' 11.5" (1.511 m)  Weight: 129 lb 1.9 oz (58.568 kg)  SpO2: 95%   Gen: well appearing, no acute distress HEENT: NCAT,  EOMi, OP clear,  PULM: CTA B CV: RRR, no mgr, no JVD AB: soft, nontender, no hsm Ext: warm, no edema, no clubbing, no cyanosis       Assessment & Plan:   Asthma Alyene is doing well on Asmanex alone.  Again, I question a diagnosis of asthma and feel that most of her symptoms are explained by allergic rhinitis and annual infectious bronchitis.  Plan: -d/c Asmanex -methacholine challenge -if methacholine challenge test is positive, then use Asmanex in cold weather months    Updated Medication List Outpatient Encounter Prescriptions as of 06/26/2012  Medication Sig Dispense Refill  . albuterol (PROVENTIL HFA;VENTOLIN HFA) 108 (90 BASE)  MCG/ACT inhaler Inhale 2 puffs into the lungs every 6 (six) hours as needed for wheezing.  1 Inhaler  5  . Blood Glucose Monitoring Suppl (PRECISION XTRA) KIT For use two times daily  1 each  1  . Calcium Carb-Cholecalciferol 600-100 MG-UNIT CAPS Take 1 tablet by mouth daily. Two by mouth daily  90 capsule  3  . esomeprazole (NEXIUM) 20 MG capsule Take 1 capsule (20 mg total) by mouth daily before breakfast.  90 capsule  3  . folic acid (FOLVITE) 1 MG tablet Take 1 mg by mouth daily.      Marland Kitchen glucose blood (FREESTYLE INSULINX TEST) test strip Use as instructed once daily to check blood sugar dx 250.00  32 each  1  . glucose blood (PRECISION XTRA TEST STRIPS) test strip Use as instructed to test sugars twice daily  180 each  3  . hydrochlorothiazide (HYDRODIURIL) 12.5 MG tablet Take 1 tablet (12.5 mg total) by mouth daily.  90 tablet  3  . lisinopril (PRINIVIL,ZESTRIL) 20 MG tablet Take 1 tablet (20 mg total) by mouth daily.  90 tablet  3  . loratadine (CLARITIN) 10 MG tablet Take 1 tablet (10 mg total) by mouth daily.  90 tablet  3  . metFORMIN (GLUCOPHAGE XR) 750 MG 24 hr tablet Take 1 tablet (750 mg total) by mouth daily with breakfast.  90 tablet  3  .  methotrexate (RHEUMATREX) 2.5 MG tablet Take 15 mg by mouth once a week.      . mometasone (ASMANEX) 220 MCG/INH inhaler Inhale 1 puff into the lungs 2 (two) times daily.      . montelukast (SINGULAIR) 10 MG tablet Take 1 tablet (10 mg total) by mouth at bedtime.  30 tablet  3  . Multiple Vitamin (MULTIVITAMIN) capsule Take 1 capsule by mouth daily.        . Needles & Syringes MISC Minimum of 20 syringes for the Levemir pen (or one full bag)  20 each  0  . Omega-3 Fatty Acids (FISH OIL) 1000 MG CAPS Take 1 capsule (1,000 mg total) by mouth daily.  90 capsule  3  . Precision Thin Lancets MISC Use as directed to check sugars twice daily  180 each  3  . simvastatin (ZOCOR) 40 MG tablet Take 1 tablet (40 mg total) by mouth daily.  90 tablet  3  .  vitamin E 400 UNIT capsule Take 400 Units by mouth daily.        . [DISCONTINUED] budesonide-formoterol (SYMBICORT) 160-4.5 MCG/ACT inhaler Inhale 2 puffs into the lungs 2 (two) times daily.  1 Inhaler  12  . [DISCONTINUED] guaiFENesin-codeine (ROBITUSSIN AC) 100-10 MG/5ML syrup Take 5 mLs by mouth 3 (three) times daily as needed for cough.  180 mL  0   No facility-administered encounter medications on file as of 06/26/2012.

## 2012-06-28 DIAGNOSIS — D485 Neoplasm of uncertain behavior of skin: Secondary | ICD-10-CM | POA: Diagnosis not present

## 2012-06-28 DIAGNOSIS — C44611 Basal cell carcinoma of skin of unspecified upper limb, including shoulder: Secondary | ICD-10-CM | POA: Diagnosis not present

## 2012-06-28 DIAGNOSIS — C44519 Basal cell carcinoma of skin of other part of trunk: Secondary | ICD-10-CM | POA: Diagnosis not present

## 2012-07-03 ENCOUNTER — Ambulatory Visit: Payer: Medicare Other | Admitting: Pulmonary Disease

## 2012-07-03 ENCOUNTER — Ambulatory Visit (HOSPITAL_COMMUNITY)
Admission: RE | Admit: 2012-07-03 | Discharge: 2012-07-03 | Disposition: A | Discharge status: Home / Self Care | Payer: Medicare Other | Source: Ambulatory Visit | Attending: Pulmonary Disease | Admitting: Pulmonary Disease

## 2012-07-03 DIAGNOSIS — J45909 Unspecified asthma, uncomplicated: Secondary | ICD-10-CM | POA: Diagnosis not present

## 2012-07-03 LAB — PULMONARY FUNCTION TEST

## 2012-07-03 MED ORDER — METHACHOLINE 0.25 MG/ML NEB SOLN
2.0000 mL | Freq: Once | RESPIRATORY_TRACT | Status: AC
  Administered 2012-07-03: 0.5 mg via RESPIRATORY_TRACT

## 2012-07-03 MED ORDER — METHACHOLINE 0.0625 MG/ML NEB SOLN
2.0000 mL | Freq: Once | RESPIRATORY_TRACT | Status: AC
  Administered 2012-07-03: 0.125 mg via RESPIRATORY_TRACT

## 2012-07-03 MED ORDER — METHACHOLINE 4 MG/ML NEB SOLN
2.0000 mL | Freq: Once | RESPIRATORY_TRACT | Status: AC
  Administered 2012-07-03: 8 mg via RESPIRATORY_TRACT

## 2012-07-03 MED ORDER — METHACHOLINE 1 MG/ML NEB SOLN
2.0000 mL | Freq: Once | RESPIRATORY_TRACT | Status: AC
  Administered 2012-07-03: 2 mg via RESPIRATORY_TRACT

## 2012-07-03 MED ORDER — METHACHOLINE 16 MG/ML NEB SOLN
2.0000 mL | Freq: Once | RESPIRATORY_TRACT | Status: AC
  Administered 2012-07-03: 32 mg via RESPIRATORY_TRACT

## 2012-07-03 MED ORDER — ALBUTEROL SULFATE (5 MG/ML) 0.5% IN NEBU
2.5000 mg | INHALATION_SOLUTION | Freq: Once | RESPIRATORY_TRACT | Status: AC
  Administered 2012-07-03: 2.5 mg via RESPIRATORY_TRACT

## 2012-07-03 MED ORDER — SODIUM CHLORIDE 0.9 % IN NEBU
3.0000 mL | INHALATION_SOLUTION | Freq: Once | RESPIRATORY_TRACT | Status: AC
  Administered 2012-07-03: 3 mL via RESPIRATORY_TRACT

## 2012-07-09 ENCOUNTER — Encounter: Payer: Self-pay | Admitting: Pulmonary Disease

## 2012-07-09 ENCOUNTER — Telehealth: Payer: Self-pay | Admitting: *Deleted

## 2012-07-09 NOTE — Telephone Encounter (Signed)
Message copied by Caryl Ada on Mon Jul 09, 2012  1:45 PM ------      Message from: Max Fickle B      Created: Mon Jul 09, 2012  1:27 PM       L,            Please let her know that her methacholine challenge test was negative.  She does not have asthma.            Thanks,            B ------

## 2012-07-09 NOTE — Telephone Encounter (Signed)
[email or web address]

## 2012-07-10 ENCOUNTER — Encounter: Payer: Self-pay | Admitting: Pulmonary Disease

## 2012-07-10 NOTE — Telephone Encounter (Signed)
Pt returned call. She asks that nurse call her home ph # now at [phone]. Hazel Sams

## 2012-07-10 NOTE — Telephone Encounter (Signed)
[email or web address]  

## 2012-07-10 NOTE — Telephone Encounter (Signed)
Patient returning call.  Patient will not be available until after 2:00pm  [phone]

## 2012-07-10 NOTE — Telephone Encounter (Signed)
Pt is aware of methacholine challenge results. Nothing further is needed.

## 2012-07-27 ENCOUNTER — Telehealth: Payer: Self-pay | Admitting: *Deleted

## 2012-07-27 ENCOUNTER — Other Ambulatory Visit (INDEPENDENT_AMBULATORY_CARE_PROVIDER_SITE_OTHER): Payer: Medicare Other

## 2012-07-27 DIAGNOSIS — E119 Type 2 diabetes mellitus without complications: Secondary | ICD-10-CM

## 2012-07-27 DIAGNOSIS — D72829 Elevated white blood cell count, unspecified: Secondary | ICD-10-CM

## 2012-07-27 LAB — CBC WITH DIFFERENTIAL/PLATELET
Basophils Relative: 0.4 % (ref 0.0–3.0)
Eosinophils Absolute: 0.1 10*3/uL (ref 0.0–0.7)
Hemoglobin: 13.3 g/dL (ref 12.0–15.0)
Lymphs Abs: 1.5 10*3/uL (ref 0.7–4.0)
MCHC: 33.3 g/dL (ref 30.0–36.0)
MCV: 98 fl (ref 78.0–100.0)
Monocytes Absolute: 0.7 10*3/uL (ref 0.1–1.0)
Neutro Abs: 2.7 10*3/uL (ref 1.4–7.7)
Neutrophils Relative %: 55.1 % (ref 43.0–77.0)
RBC: 4.06 Mil/uL (ref 3.87–5.11)

## 2012-07-27 LAB — COMPREHENSIVE METABOLIC PANEL
Alkaline Phosphatase: 61 U/L (ref 39–117)
BUN: 13 mg/dL (ref 6–23)
Creatinine, Ser: 0.6 mg/dL (ref 0.4–1.2)
Glucose, Bld: 113 mg/dL — ABNORMAL HIGH (ref 70–99)
Total Bilirubin: 0.7 mg/dL (ref 0.3–1.2)

## 2012-07-27 LAB — LIPID PANEL
Cholesterol: 170 mg/dL (ref 0–200)
LDL Cholesterol: 94 mg/dL (ref 0–99)
Triglycerides: 96 mg/dL (ref 0.0–149.0)
VLDL: 19.2 mg/dL (ref 0.0–40.0)

## 2012-07-27 NOTE — Telephone Encounter (Signed)
Error

## 2012-07-29 ENCOUNTER — Encounter: Payer: Self-pay | Admitting: Internal Medicine

## 2012-08-01 ENCOUNTER — Encounter: Payer: Self-pay | Admitting: Internal Medicine

## 2012-08-01 ENCOUNTER — Ambulatory Visit (INDEPENDENT_AMBULATORY_CARE_PROVIDER_SITE_OTHER): Payer: Medicare Other | Admitting: Internal Medicine

## 2012-08-01 VITALS — BP 108/58 | HR 76 | Temp 97.7°F | Resp 14 | Ht 59.0 in | Wt 126.8 lb

## 2012-08-01 DIAGNOSIS — E785 Hyperlipidemia, unspecified: Secondary | ICD-10-CM | POA: Diagnosis not present

## 2012-08-01 DIAGNOSIS — K219 Gastro-esophageal reflux disease without esophagitis: Secondary | ICD-10-CM | POA: Diagnosis not present

## 2012-08-01 DIAGNOSIS — Z0000 Encounter for general adult medical examination without abnormal findings: Secondary | ICD-10-CM | POA: Diagnosis not present

## 2012-08-01 DIAGNOSIS — Z79899 Other long term (current) drug therapy: Secondary | ICD-10-CM

## 2012-08-01 DIAGNOSIS — K5730 Diverticulosis of large intestine without perforation or abscess without bleeding: Secondary | ICD-10-CM | POA: Diagnosis not present

## 2012-08-01 DIAGNOSIS — J45901 Unspecified asthma with (acute) exacerbation: Secondary | ICD-10-CM

## 2012-08-01 DIAGNOSIS — I10 Essential (primary) hypertension: Secondary | ICD-10-CM

## 2012-08-01 DIAGNOSIS — E119 Type 2 diabetes mellitus without complications: Secondary | ICD-10-CM | POA: Diagnosis not present

## 2012-08-01 DIAGNOSIS — J4531 Mild persistent asthma with (acute) exacerbation: Secondary | ICD-10-CM

## 2012-08-01 DIAGNOSIS — L408 Other psoriasis: Secondary | ICD-10-CM

## 2012-08-01 DIAGNOSIS — IMO0001 Reserved for inherently not codable concepts without codable children: Secondary | ICD-10-CM

## 2012-08-01 DIAGNOSIS — L404 Guttate psoriasis: Secondary | ICD-10-CM

## 2012-08-01 MED ORDER — FISH OIL 1000 MG PO CAPS: 1.0000 | ORAL_CAPSULE | Freq: Every day | ORAL | Status: DC

## 2012-08-01 MED ORDER — MONTELUKAST SODIUM 10 MG PO TABS: 10.0000 mg | ORAL_TABLET | Freq: Every day | ORAL | Status: DC

## 2012-08-01 MED ORDER — ESOMEPRAZOLE MAGNESIUM 20 MG PO CPDR: 20.0000 mg | DELAYED_RELEASE_CAPSULE | Freq: Every day | ORAL | Status: DC

## 2012-08-01 MED ORDER — LISINOPRIL 20 MG PO TABS: 20.0000 mg | ORAL_TABLET | Freq: Every day | ORAL | Status: DC

## 2012-08-01 MED ORDER — METFORMIN HCL ER 750 MG PO TB24: 750.0000 mg | ORAL_TABLET | Freq: Every day | ORAL | Status: DC

## 2012-08-01 MED ORDER — HYDROCHLOROTHIAZIDE 12.5 MG PO TABS: 12.5000 mg | ORAL_TABLET | Freq: Every day | ORAL | Status: DC

## 2012-08-01 NOTE — Patient Instructions (Addendum)
You had your annual Medicare wellness exam today  You are up to date on all of your vaccines and screenings.    Your next colon CA screeningcolonoscopy is not due until  2015 with Dr. Loreta Ave   We will fax Dr. Roseanne Kaufman your  bloodwork results

## 2012-08-01 NOTE — Assessment & Plan Note (Signed)
Lfts, CBC  normal

## 2012-08-01 NOTE — Assessment & Plan Note (Signed)
Well controlled on current regimen. Renal function stable, no changes today. 

## 2012-08-01 NOTE — Assessment & Plan Note (Signed)
Diagnosis is in question by pulmonary.

## 2012-08-01 NOTE — Assessment & Plan Note (Addendum)
Controlled with methotrexate.

## 2012-08-01 NOTE — Assessment & Plan Note (Signed)
Annual comprehensive exam was done including breast exam was done.  All screenings have been addressed .

## 2012-08-01 NOTE — Assessment & Plan Note (Signed)
Next colonoscopy is due 2015 .

## 2012-08-01 NOTE — Progress Notes (Signed)
Patient ID: Emily Ryan, female   DOB: 1945-09-19, 67 y.o.   MRN: [id number]  The patient is here for annual Medicare wellness examination and management of other chronic and acute problems.  Mohs procedure by dr Dellia Beckwith went well.  One week ago .  Has had more excisions by Dr Otho Ket sand she is requesting copies of recent labs.   She was evaluated by Dr Chase Picket and there was no evidence of asthma by PFTS   The risk factors are reflected in the social history.  The roster of all physicians providing medical care to patient - is listed in the Snapshot section of the chart.  Activities of daily living:  The patient is 100% independent in all ADLs: dressing, toileting, feeding as well as independent mobility  Home safety : The patient has smoke detectors in the home. They wear seatbelts.  There are no firearms at home. There is no violence in the home.   There is no risks for hepatitis, STDs or HIV. There is no   history of blood transfusion. They have no travel history to infectious disease endemic areas of the world.  The patient has seen their dentist in the last six month. They have seen their eye doctor in the last year. They admit to slight hearing difficulty with regard to whispered voices and some television programs.  They have deferred audiologic testing in the last year.  They do not  have excessive sun exposure. Discussed the need for sun protection: hats, long sleeves and use of sunscreen if there is significant sun exposure.   Diet: the importance of a healthy diet is discussed. They do have a healthy diet.  The benefits of regular aerobic exercise were discussed. She walks 4 times per week ,  20 minutes.   Depression screen: there are no signs or vegative symptoms of depression- irritability, change in appetite, anhedonia, sadness/tearfullness.  Cognitive assessment: the patient manages all their financial and personal affairs and is actively engaged. They could relate  day,date,year and events; recalled 2/3 objects at 3 minutes; performed clock-face test normally.  The following portions of the patient's history were reviewed and updated as appropriate: allergies, current medications, past family history, past medical history,  past surgical history, past social history  and problem list.  Visual acuity was not assessed per patient preference since she has regular follow up with her ophthalmologist. Hearing and body mass index were assessed and reviewed.   During the course of the visit the patient was educated and counseled about appropriate screening and preventive services including : fall prevention , diabetes screening, nutrition counseling, colorectal cancer screening, and recommended immunizations.    Objective:  BP 108/58  Pulse 76  Temp(Src) 97.7 F (36.5 C) (Oral)  Resp 14  Ht 4\' 11"  (1.499 m)  Wt 126 lb 12 oz (57.493 kg)  BMI 25.59 kg/m2  SpO2 96%  General Appearance:    Alert, cooperative, no distress, appears stated age  Head:    Normocephalic, without obvious abnormality, atraumatic  Eyes:    PERRL, conjunctiva/corneas clear, EOM's intact, fundi    benign, both eyes  Ears:    Normal TM's and external ear canals, both ears  Nose:   Nares normal, septum midline, mucosa normal, no drainage    or sinus tenderness  Throat:   Lips, mucosa, and tongue normal; teeth and gums normal  Neck:   Supple, symmetrical, trachea midline, no adenopathy;    thyroid:  no enlargement/tenderness/nodules; no carotid  bruit or JVD  Back:     Symmetric, no curvature, ROM normal, no CVA tenderness  Lungs:     Clear to auscultation bilaterally, respirations unlabored  Chest Wall:    No tenderness or deformity   Heart:    Regular rate and rhythm, S1 and S2 normal, no murmur, rub   or gallop  Breast Exam:    No tenderness, masses, or nipple abnormality  Abdomen:     Soft, non-tender, bowel sounds active all four quadrants,    no masses, no organomegaly      Extremities:   Extremities normal, atraumatic, no cyanosis or edema  Pulses:   2+ and symmetric all extremities  Skin:   Skin color, texture, turgor normal, no rashes or lesions  Lymph nodes:   Cervical, supraclavicular, and axillary nodes normal  Neurologic:   CNII-XII intact, normal strength, sensation and reflexes    throughout   Assessment and Plan:  Diverticulosis of sigmoid colon Next colonoscopy is due 2015 .    Diabetes mellitus, type II Well-controlled on current medications.  hemoglobin A1c has been consistently less than 7.0 . She is up-to-date on eye exams and his foot exam is normal.  She has a normal urine microalbumin to creatinine ratio at next visit. She is on the appropriate medications.  Hyperlipidemia LDL and triglycerides are near goal on current medications. She has  no side effects and liver enzymes are normal. No changes today   Hypertension Well controlled on current regimen. Renal function stable, no changes today.  Asthma Diagnosis was ruled out by  pulmonary.   Guttate psoriasis Controlled with methotrexate.  Encounter for long-term (current) use of other medications Lfts, CBC  normal  Routine general medical examination at a health care facility Annual comprehensive exam was done including breast exam was done.  All screenings have been addressed .    Updated Medication List Outpatient Encounter Prescriptions as of 08/01/2012  Medication Sig Dispense Refill  . Blood Glucose Monitoring Suppl (PRECISION XTRA) KIT For use two times daily  1 each  1  . Calcium Carb-Cholecalciferol 600-100 MG-UNIT CAPS Take 1 tablet by mouth daily. Two by mouth daily  90 capsule  3  . esomeprazole (NEXIUM) 20 MG capsule Take 1 capsule (20 mg total) by mouth daily before breakfast.  90 capsule  3  . folic acid (FOLVITE) 1 MG tablet Take 1 mg by mouth daily.      Marland Kitchen glucose blood (FREESTYLE INSULINX TEST) test strip Use as instructed once daily to check blood sugar dx  250.00  32 each  1  . glucose blood (PRECISION XTRA TEST STRIPS) test strip Use as instructed to test sugars twice daily  180 each  3  . hydrochlorothiazide (HYDRODIURIL) 12.5 MG tablet Take 1 tablet (12.5 mg total) by mouth daily.  90 tablet  3  . lisinopril (PRINIVIL,ZESTRIL) 20 MG tablet Take 1 tablet (20 mg total) by mouth daily.  90 tablet  3  . loratadine (CLARITIN) 10 MG tablet Take 1 tablet (10 mg total) by mouth daily.  90 tablet  3  . metFORMIN (GLUCOPHAGE XR) 750 MG 24 hr tablet Take 1 tablet (750 mg total) by mouth daily with breakfast.  90 tablet  3  . methotrexate (RHEUMATREX) 2.5 MG tablet Take 15 mg by mouth once a week.      . montelukast (SINGULAIR) 10 MG tablet Take 1 tablet (10 mg total) by mouth at bedtime.  90 tablet  3  . Multiple  Vitamin (MULTIVITAMIN) capsule Take 1 capsule by mouth daily.        . Needles & Syringes MISC Minimum of 20 syringes for the Levemir pen (or one full bag)  20 each  0  . Omega-3 Fatty Acids (FISH OIL) 1000 MG CAPS Take 1 capsule (1,000 mg total) by mouth daily.  90 capsule  3  . Precision Thin Lancets MISC Use as directed to check sugars twice daily  180 each  3  . simvastatin (ZOCOR) 40 MG tablet Take 1 tablet (40 mg total) by mouth daily.  90 tablet  3  . vitamin E 400 UNIT capsule Take 400 Units by mouth daily.        . [DISCONTINUED] esomeprazole (NEXIUM) 20 MG capsule Take 1 capsule (20 mg total) by mouth daily before breakfast.  90 capsule  3  . [DISCONTINUED] hydrochlorothiazide (HYDRODIURIL) 12.5 MG tablet Take 1 tablet (12.5 mg total) by mouth daily.  90 tablet  3  . [DISCONTINUED] lisinopril (PRINIVIL,ZESTRIL) 20 MG tablet Take 1 tablet (20 mg total) by mouth daily.  90 tablet  3  . [DISCONTINUED] metFORMIN (GLUCOPHAGE XR) 750 MG 24 hr tablet Take 1 tablet (750 mg total) by mouth daily with breakfast.  90 tablet  3  . [DISCONTINUED] montelukast (SINGULAIR) 10 MG tablet Take 1 tablet (10 mg total) by mouth at bedtime.  30 tablet  3  .  [DISCONTINUED] Omega-3 Fatty Acids (FISH OIL) 1000 MG CAPS Take 1 capsule (1,000 mg total) by mouth daily.  90 capsule  3  . albuterol (PROVENTIL HFA;VENTOLIN HFA) 108 (90 BASE) MCG/ACT inhaler Inhale 2 puffs into the lungs every 6 (six) hours as needed for wheezing.  1 Inhaler  5  . mometasone (ASMANEX) 220 MCG/INH inhaler Inhale 1 puff into the lungs 2 (two) times daily.       No facility-administered encounter medications on file as of 08/01/2012.

## 2012-08-01 NOTE — Assessment & Plan Note (Signed)
Well-controlled on current medications.  hemoglobin A1c has been consistently less than 7.0 . She is up-to-date on eye exams and his foot exam is normal.  She has a normal urine microalbumin to creatinine ratio at next visit. She is on the appropriate medications.

## 2012-08-01 NOTE — Assessment & Plan Note (Signed)
LDL and triglycerides are near goal on current medications. She has  no side effects and liver enzymes are normal. No changes today

## 2012-08-03 ENCOUNTER — Encounter: Payer: Self-pay | Admitting: Internal Medicine

## 2012-09-04 ENCOUNTER — Encounter: Payer: Self-pay | Admitting: Internal Medicine

## 2012-09-04 ENCOUNTER — Encounter: Payer: Self-pay | Admitting: Pulmonary Disease

## 2012-09-04 MED ORDER — MONTELUKAST SODIUM 10 MG PO TABS: 10.0000 mg | ORAL_TABLET | Freq: Every day | ORAL | Status: DC

## 2012-09-04 NOTE — Telephone Encounter (Signed)
Done- rx up front for pick up at the [facility]

## 2012-09-04 NOTE — Telephone Encounter (Signed)
Pt sent later myChart message stating prescription was at pharmacy, so refill not needed.

## 2012-09-04 NOTE — Telephone Encounter (Signed)
Emily Ryan, the pt is needing a written rx for Singulair to take to [place]. Pt wants to pick it up today in [place]. Carron Curie, CMA

## 2012-09-26 DIAGNOSIS — D485 Neoplasm of uncertain behavior of skin: Secondary | ICD-10-CM | POA: Diagnosis not present

## 2012-09-26 DIAGNOSIS — L408 Other psoriasis: Secondary | ICD-10-CM | POA: Diagnosis not present

## 2012-09-26 DIAGNOSIS — D235 Other benign neoplasm of skin of trunk: Secondary | ICD-10-CM | POA: Diagnosis not present

## 2012-10-04 DIAGNOSIS — C4441 Basal cell carcinoma of skin of scalp and neck: Secondary | ICD-10-CM | POA: Diagnosis not present

## 2012-10-18 DIAGNOSIS — C4441 Basal cell carcinoma of skin of scalp and neck: Secondary | ICD-10-CM | POA: Diagnosis not present

## 2012-10-21 ENCOUNTER — Encounter: Payer: Self-pay | Admitting: Internal Medicine

## 2012-10-27 ENCOUNTER — Encounter: Payer: Self-pay | Admitting: Internal Medicine

## 2012-10-31 ENCOUNTER — Ambulatory Visit (INDEPENDENT_AMBULATORY_CARE_PROVIDER_SITE_OTHER): Payer: Medicare Other | Admitting: *Deleted

## 2012-10-31 DIAGNOSIS — Z23 Encounter for immunization: Secondary | ICD-10-CM

## 2012-11-02 ENCOUNTER — Telehealth: Payer: Self-pay | Admitting: *Deleted

## 2012-11-02 DIAGNOSIS — E119 Type 2 diabetes mellitus without complications: Secondary | ICD-10-CM

## 2012-11-02 NOTE — Telephone Encounter (Signed)
Pt is coming in for lab 11.03.2014 what labs and dx?

## 2012-11-05 ENCOUNTER — Encounter: Payer: Self-pay | Admitting: Internal Medicine

## 2012-11-05 ENCOUNTER — Other Ambulatory Visit (INDEPENDENT_AMBULATORY_CARE_PROVIDER_SITE_OTHER): Payer: Medicare Other

## 2012-11-05 DIAGNOSIS — E119 Type 2 diabetes mellitus without complications: Secondary | ICD-10-CM

## 2012-11-05 LAB — MICROALBUMIN / CREATININE URINE RATIO
Creatinine,U: 13.8 mg/dL
Microalb Creat Ratio: 8 mg/g (ref 0.0–30.0)
Microalb, Ur: 1.1 mg/dL (ref 0.0–1.9)

## 2012-11-05 LAB — LIPID PANEL
HDL: 57.5 mg/dL (ref >39.00)
LDL Cholesterol: 82 mg/dL (ref 0–99)
Total CHOL/HDL Ratio: 3
Triglycerides: 163 mg/dL — ABNORMAL HIGH (ref 0.0–149.0)
VLDL: 32.6 mg/dL (ref 0.0–40.0)

## 2012-11-05 LAB — COMPREHENSIVE METABOLIC PANEL
AST: 32 U/L (ref 0–37)
Alkaline Phosphatase: 62 U/L (ref 39–117)
BUN: 17 mg/dL (ref 6–23)
Creatinine, Ser: 0.6 mg/dL (ref 0.4–1.2)
Glucose, Bld: 89 mg/dL (ref 70–99)
Potassium: 4.2 mEq/L (ref 3.5–5.1)
Total Bilirubin: 0.9 mg/dL (ref 0.3–1.2)

## 2012-11-07 ENCOUNTER — Ambulatory Visit (INDEPENDENT_AMBULATORY_CARE_PROVIDER_SITE_OTHER): Payer: Medicare Other | Admitting: Internal Medicine

## 2012-11-07 ENCOUNTER — Encounter: Payer: Self-pay | Admitting: Internal Medicine

## 2012-11-07 VITALS — BP 118/60 | HR 75 | Temp 97.9°F | Resp 12 | Ht 59.0 in | Wt 127.0 lb

## 2012-11-07 DIAGNOSIS — E785 Hyperlipidemia, unspecified: Secondary | ICD-10-CM

## 2012-11-07 DIAGNOSIS — E119 Type 2 diabetes mellitus without complications: Secondary | ICD-10-CM

## 2012-11-07 DIAGNOSIS — I10 Essential (primary) hypertension: Secondary | ICD-10-CM

## 2012-11-07 DIAGNOSIS — L404 Guttate psoriasis: Secondary | ICD-10-CM

## 2012-11-07 DIAGNOSIS — L408 Other psoriasis: Secondary | ICD-10-CM | POA: Diagnosis not present

## 2012-11-07 DIAGNOSIS — Z13820 Encounter for screening for osteoporosis: Secondary | ICD-10-CM

## 2012-11-07 MED ORDER — LORATADINE 10 MG PO TABS: 10.0000 mg | ORAL_TABLET | Freq: Every day | ORAL | Status: DC

## 2012-11-07 MED ORDER — CALCIUM CARB-CHOLECALCIFEROL 600-100 MG-UNIT PO CAPS: 1.0000 | ORAL_CAPSULE | Freq: Every day | ORAL | Status: DC

## 2012-11-07 MED ORDER — SIMVASTATIN 40 MG PO TABS: 40.0000 mg | ORAL_TABLET | Freq: Every day | ORAL | Status: DC

## 2012-11-07 NOTE — Patient Instructions (Signed)
You are doing  So well, you do not need to return for 6 months for follow up  Repeat all labs including fasting lipids prior to next visit

## 2012-11-07 NOTE — Progress Notes (Signed)
Pre-visit discussion using our clinic review tool. No additional management support is needed unless otherwise documented below in the visit note.  

## 2012-11-08 ENCOUNTER — Encounter: Payer: Self-pay | Admitting: Internal Medicine

## 2012-11-08 NOTE — Assessment & Plan Note (Signed)
LDL and triglycerides are at goal on current medications.  She has no side effects and liver enzymes are normal. No changes today.  Lab Results  Component Value Date   CHOL 172 11/05/2012   HDL 57.50 11/05/2012   LDLCALC 82 11/05/2012   LDLDIRECT 119.7 07/18/2011   TRIG 163.0* 11/05/2012   CHOLHDL 3 11/05/2012   Lab Results  Component Value Date   ALT 33 11/05/2012   AST 32 11/05/2012   ALKPHOS 62 11/05/2012   BILITOT 0.9 11/05/2012

## 2012-11-08 NOTE — Assessment & Plan Note (Addendum)
Well-controlled on current medications.  hemoglobin A1c has been consistently less than 7.0 . She is up-to-date on eye exams and foot exam is normal .  She has a normal urine microalbumin to creatinine ratio at next visit. she is on the appropriate medications. Lab Results  Component Value Date   HGBA1C 6.2 11/05/2012

## 2012-11-08 NOTE — Assessment & Plan Note (Signed)
Well controlled on current regimen. Renal function stable, no changes today. 

## 2012-11-08 NOTE — Progress Notes (Signed)
Patient ID: Emily Ryan, female   DOB: 1945-05-29, 67 y.o.   MRN: [id number]  Patient Active Problem List   Diagnosis Date Noted  . Basal cell carcinoma of left side of nose 04/28/2012  . Asthma 04/28/2012  . Leukocytosis, unspecified 04/26/2012  . Diabetes mellitus, type II 10/21/2011  . Encounter for long-term (current) use of other medications 10/21/2011  . Routine general medical examination at a health care facility 07/18/2011  . Guttate psoriasis 04/04/2011  . Constipation 01/19/2011  . Reflux esophagitis 01/19/2011  . Diverticulosis of sigmoid colon 11/10/2010  . Gastritis 09/09/2010  . Hypertension 09/07/2010  . Hyperlipidemia 09/07/2010    Subjective:  CC:   Chief Complaint  Patient presents with  . Follow-up    3 month followup    HPI:   Emily Ryan a 67 y.o. female who presents for 3 month follow up on diabetes mellitus, hyperlipidemia and hypertension.  Tolerating medications,  No hypoglycemic events. Exercising regularly ,  Following a low GI diet.    Past Medical History  Diagnosis Date  . Hyperlipidemia   . Hypertension   . Menopausal disorder   . Complicated pregnancy     1st pregnancy complicated by post operative hemorrhage and 2ng complicated by epidural  . Diabetes mellitus   . Diverticulosis of colon (without mention of hemorrhage)   . Guttate psoriasis     Past Surgical History  Procedure Laterality Date  . Hernia repair    . Varicose vein surgery  1974    right leg   . Tonsillectomy  1953  . Septoplasty  1969  . Abdominal hysterectomy    . Bladder repair  1991    lift   . Rectocele repair    . Cystocele repair    . Partial colectomy  Nov 2012    left, secondary to diverticular perf  . Appendectomy  Dec 2012       The following portions of the patient's history were reviewed and updated as appropriate: Allergies, current medications, and problem list.    Review of Systems:   12 Pt  review of systems was negative  except those addressed in the HPI,     History   Social History  . Marital Status: Married    Spouse Name: N/A    Number of Children: N/A  . Years of Education: N/A   Occupational History  . Not on file.   Social History Main Topics  . Smoking status: Never Smoker   . Smokeless tobacco: Never Used  . Alcohol Use: 3.0 oz/week    5 Glasses of wine per week     Comment: Occasional  . Drug Use: No  . Sexual Activity: Yes    Birth Control/ Protection: Post-menopausal   Other Topics Concern  . Not on file   Social History Narrative  . No narrative on file    Objective:  Filed Vitals:   11/07/12 0903  BP: 118/60  Pulse: 75  Temp: 97.9 F (36.6 C)  Resp: 12     General appearance: alert, cooperative and appears stated age Ears: normal TM's and external ear canals both ears Throat: lips, mucosa, and tongue normal; teeth and gums normal Neck: no adenopathy, no carotid bruit, supple, symmetrical, trachea midline and thyroid not enlarged, symmetric, no tenderness/mass/nodules Back: symmetric, no curvature. ROM normal. No CVA tenderness. Lungs: clear to auscultation bilaterally Heart: regular rate and rhythm, S1, S2 normal, no murmur, click, rub or gallop Abdomen: soft, non-tender; bowel sounds normal; no  masses,  no organomegaly Pulses: 2+ and symmetric Skin: Skin color, texture, turgor normal. No rashes or lesions Lymph nodes: Cervical, supraclavicular, and axillary nodes normal. Foot exam:  Nails are well trimmed,  No callouses,  Sensation intact to microfilament  Assessment and Plan:  Guttate psoriasis    Diabetes mellitus, type II Well-controlled on current medications.  hemoglobin A1c has been consistently less than 7.0 . She is up-to-date on eye exams and foot exam is normal .  She has a normal urine microalbumin to creatinine ratio at next visit. she is on the appropriate medications. Lab Results  Component Value Date   HGBA1C 6.2 11/05/2012     Hypertension Well controlled on current regimen. Renal function stable, no changes today.  Hyperlipidemia LDL and triglycerides are at goal on current medications.  She has no side effects and liver enzymes are normal. No changes today.  Lab Results  Component Value Date   CHOL 172 11/05/2012   HDL 57.50 11/05/2012   LDLCALC 82 11/05/2012   LDLDIRECT 119.7 07/18/2011   TRIG 163.0* 11/05/2012   CHOLHDL 3 11/05/2012   Lab Results  Component Value Date   ALT 33 11/05/2012   AST 32 11/05/2012   ALKPHOS 62 11/05/2012   BILITOT 0.9 11/05/2012      Updated Medication List Outpatient Encounter Prescriptions as of 11/07/2012  Medication Sig  . albuterol (PROVENTIL HFA;VENTOLIN HFA) 108 (90 BASE) MCG/ACT inhaler Inhale 2 puffs into the lungs every 6 (six) hours as needed for wheezing.  . Blood Glucose Monitoring Suppl (PRECISION XTRA) KIT For use two times daily  . Calcium Carb-Cholecalciferol 600-100 MG-UNIT CAPS Take 1 tablet by mouth daily. Two by mouth daily  . esomeprazole (NEXIUM) 20 MG capsule Take 1 capsule (20 mg total) by mouth daily before breakfast.  . folic acid (FOLVITE) 1 MG tablet Take 1 mg by mouth daily.  Marland Kitchen glucose blood (FREESTYLE INSULINX TEST) test strip Use as instructed once daily to check blood sugar dx 250.00  . glucose blood (PRECISION XTRA TEST STRIPS) test strip Use as instructed to test sugars twice daily  . hydrochlorothiazide (HYDRODIURIL) 12.5 MG tablet Take 1 tablet (12.5 mg total) by mouth daily.  Marland Kitchen lisinopril (PRINIVIL,ZESTRIL) 20 MG tablet Take 1 tablet (20 mg total) by mouth daily.  Marland Kitchen loratadine (CLARITIN) 10 MG tablet Take 1 tablet (10 mg total) by mouth daily.  . metFORMIN (GLUCOPHAGE XR) 750 MG 24 hr tablet Take 1 tablet (750 mg total) by mouth daily with breakfast.  . methotrexate (RHEUMATREX) 2.5 MG tablet Take 15 mg by mouth once a week.  . mometasone (ASMANEX) 220 MCG/INH inhaler Inhale 1 puff into the lungs 2 (two) times daily.  . montelukast  (SINGULAIR) 10 MG tablet Take 1 tablet (10 mg total) by mouth at bedtime.  . Multiple Vitamin (MULTIVITAMIN) capsule Take 1 capsule by mouth daily.    . Needles & Syringes MISC Minimum of 20 syringes for the Levemir pen (or one full bag)  . Omega-3 Fatty Acids (FISH OIL) 1000 MG CAPS Take 1 capsule (1,000 mg total) by mouth daily.  . Precision Thin Lancets MISC Use as directed to check sugars twice daily  . simvastatin (ZOCOR) 40 MG tablet Take 1 tablet (40 mg total) by mouth daily.  . vitamin E 400 UNIT capsule Take 400 Units by mouth daily.    . [DISCONTINUED] Calcium Carb-Cholecalciferol 600-100 MG-UNIT CAPS Take 1 tablet by mouth daily. Two by mouth daily  . [DISCONTINUED] loratadine (CLARITIN) 10 MG  tablet Take 1 tablet (10 mg total) by mouth daily.  . [DISCONTINUED] simvastatin (ZOCOR) 40 MG tablet Take 1 tablet (40 mg total) by mouth daily.     Orders Placed This Encounter  Procedures  . DG Bone Density    No Follow-up on file.

## 2012-11-12 ENCOUNTER — Encounter: Payer: Self-pay | Admitting: Internal Medicine

## 2012-11-12 DIAGNOSIS — Z85828 Personal history of other malignant neoplasm of skin: Secondary | ICD-10-CM | POA: Diagnosis not present

## 2012-11-12 DIAGNOSIS — L570 Actinic keratosis: Secondary | ICD-10-CM | POA: Diagnosis not present

## 2012-11-12 DIAGNOSIS — C44319 Basal cell carcinoma of skin of other parts of face: Secondary | ICD-10-CM | POA: Diagnosis not present

## 2012-11-12 DIAGNOSIS — L905 Scar conditions and fibrosis of skin: Secondary | ICD-10-CM | POA: Diagnosis not present

## 2012-12-07 DIAGNOSIS — L408 Other psoriasis: Secondary | ICD-10-CM | POA: Diagnosis not present

## 2012-12-07 DIAGNOSIS — L910 Hypertrophic scar: Secondary | ICD-10-CM | POA: Diagnosis not present

## 2012-12-07 DIAGNOSIS — Z85828 Personal history of other malignant neoplasm of skin: Secondary | ICD-10-CM | POA: Diagnosis not present

## 2012-12-07 DIAGNOSIS — D485 Neoplasm of uncertain behavior of skin: Secondary | ICD-10-CM | POA: Diagnosis not present

## 2012-12-10 DIAGNOSIS — Z79899 Other long term (current) drug therapy: Secondary | ICD-10-CM | POA: Diagnosis not present

## 2012-12-10 DIAGNOSIS — L408 Other psoriasis: Secondary | ICD-10-CM | POA: Diagnosis not present

## 2012-12-21 DIAGNOSIS — C44611 Basal cell carcinoma of skin of unspecified upper limb, including shoulder: Secondary | ICD-10-CM | POA: Diagnosis not present

## 2012-12-21 DIAGNOSIS — D485 Neoplasm of uncertain behavior of skin: Secondary | ICD-10-CM | POA: Diagnosis not present

## 2012-12-21 DIAGNOSIS — C44519 Basal cell carcinoma of skin of other part of trunk: Secondary | ICD-10-CM | POA: Diagnosis not present

## 2013-01-28 ENCOUNTER — Encounter: Payer: Self-pay | Admitting: Pulmonary Disease

## 2013-01-28 ENCOUNTER — Ambulatory Visit (INDEPENDENT_AMBULATORY_CARE_PROVIDER_SITE_OTHER): Payer: Medicare Other | Admitting: Pulmonary Disease

## 2013-01-28 VITALS — BP 116/68 | HR 63 | Temp 98.3°F | Ht 59.0 in | Wt 130.0 lb

## 2013-01-28 DIAGNOSIS — J309 Allergic rhinitis, unspecified: Secondary | ICD-10-CM | POA: Diagnosis not present

## 2013-01-28 DIAGNOSIS — J45901 Unspecified asthma with (acute) exacerbation: Secondary | ICD-10-CM | POA: Diagnosis not present

## 2013-01-28 DIAGNOSIS — Z23 Encounter for immunization: Secondary | ICD-10-CM

## 2013-01-28 NOTE — Assessment & Plan Note (Addendum)
She does not have asthma based on her negative methacholine challenge in 07/2012.  All her symptoms are explained by her allergic rhinitis and upper airway irritation.  Today we focused on our discussion on how to manage her intermittent cough, wheeze, and congestion which come from sinus issues and not asthma.  Plan: Continue taking the Claritin D and Singulair as you are doing When you get a cold:  Sinus congestion> saline rinses, hold claritin and use chlorpheniramine, use pseudophed or phenylephrine  Cough> use dextromethorphan  Congestion (chest) > guaifenesin

## 2013-01-28 NOTE — Patient Instructions (Signed)
Continue taking the Claritin D and Singulair as you are doing When you get a cold:  Sinus congestion> saline rinses, hold claritin and use chlorpheniramine, use pseudophed or phenylephrine  Cough> use dextromethorphan  Congestion (chest) > guaifenesin  We will see you back on an as needed basis

## 2013-01-28 NOTE — Assessment & Plan Note (Signed)
This is her real problem, not asthma. Take Singulair daily, Take Claritin D daliy

## 2013-01-28 NOTE — Progress Notes (Signed)
Subjective:    Patient ID: Emily Ryan, female    DOB: [birth year], 68 y.o.   MRN: [id number]  Synopsis: Emily Ryan first saw the [facility] in 06/2012 for evaluation of annual bronchitis, allergic rhinitis and possible asthma.  Simple spirometry was normal. She did well with step down therapy from Symbicort to Asmanex. She had a methacholine challenge which was normal in July 2014.   HPI   06/26/2012 ROV >> Emily Ryan has done well with step down to Asmanex from the symbicort. She has not had cough, shortness of breath, or had to use her albuterol inhaler.  Her sinus symptoms are minimal.  She denies fevers, chills, or other changes.  In general, she feels like she is doing very well.  01/28/2013 ROV >> Emily Ryan has been doing really well since the last visit. She continues to take the sinuglair daily and she hasn't had any colds or URI's since the last visit.  She has not had respiratory symptoms at all since the last visit. She continue to take the Claritin D daily.     Past Medical History  Diagnosis Date  . Hyperlipidemia   . Hypertension   . Menopausal disorder   . Complicated pregnancy     1st pregnancy complicated by post operative hemorrhage and 2ng complicated by epidural  . Diabetes mellitus   . Diverticulosis of colon (without mention of hemorrhage)   . Guttate psoriasis      Review of Systems      Objective:   Physical Exam   Filed Vitals:   01/28/13 0923  BP: 116/68  Pulse: 63  Temp: 98.3 F (36.8 C)  TempSrc: Oral  Height: 4' 11"  (1.499 m)  Weight: 130 lb (58.968 kg)  SpO2: 96%  RA  Gen: well appearing, no acute distress HEENT: NCAT,  EOMi, OP clear,  PULM: CTA B CV: RRR, no mgr, no JVD AB: soft, nontender, no hsm Ext: warm, no edema, no clubbing, no cyanosis  PFTS done at [facility] May 2014    FVC 2.44L  105% predFEV 1 1.96L (119% predicted) FEV1/FVC : 80 % pred DLCO 111% pred TLC 4.12L  102%  RV 1.66L  112%   06/2012 ACQ (Asmanex)  0/7  07/2012 spiro PFT> ratio 82%, FEV 1 1.98L (105% pred), no change with BD 07/2012 methacholine challenge> no change with ay dose. Negative study      Assessment & Plan:   Asthma She does not have asthma based on her negative methacholine challenge in 07/2012.  All her symptoms are explained by her allergic rhinitis and upper airway irritation.  Today we focused on our discussion on how to manage her intermittent cough, wheeze, and congestion which come from sinus issues and not asthma.  Plan: Continue taking the Claritin D and Singulair as you are doing When you get a cold:  Sinus congestion> saline rinses, hold claritin and use chlorpheniramine, use pseudophed or phenylephrine  Cough> use dextromethorphan  Congestion (chest) > guaifenesin  Allergic rhinitis This is her real problem, not asthma. Take Singulair daily, Take Claritin D daliy    Updated Medication List Outpatient Encounter Prescriptions as of 01/28/2013  Medication Sig  . albuterol (PROVENTIL HFA;VENTOLIN HFA) 108 (90 BASE) MCG/ACT inhaler Inhale 2 puffs into the lungs every 6 (six) hours as needed for wheezing.  . Blood Glucose Monitoring Suppl (PRECISION XTRA) KIT For use two times daily  . Calcium Carb-Cholecalciferol 600-100 MG-UNIT CAPS Take 1 tablet by mouth daily. Two by mouth daily  .  esomeprazole (NEXIUM) 20 MG capsule Take 1 capsule (20 mg total) by mouth daily before breakfast.  . folic acid (FOLVITE) 1 MG tablet Take 1 mg by mouth daily.  Marland Kitchen glucose blood (FREESTYLE INSULINX TEST) test strip Use as instructed once daily to check blood sugar dx 250.00  . glucose blood (PRECISION XTRA TEST STRIPS) test strip Use as instructed to test sugars twice daily  . hydrochlorothiazide (HYDRODIURIL) 12.5 MG tablet Take 1 tablet (12.5 mg total) by mouth daily.  Marland Kitchen lisinopril (PRINIVIL,ZESTRIL) 20 MG tablet Take 1 tablet (20 mg total) by mouth daily.  Marland Kitchen loratadine (CLARITIN) 10 MG tablet Take 1 tablet (10 mg total)  by mouth daily.  . metFORMIN (GLUCOPHAGE XR) 750 MG 24 hr tablet Take 1 tablet (750 mg total) by mouth daily with breakfast.  . methotrexate (RHEUMATREX) 2.5 MG tablet Take 15 mg by mouth once a week.  . mometasone (ASMANEX) 220 MCG/INH inhaler Inhale 1 puff into the lungs 2 (two) times daily.  . montelukast (SINGULAIR) 10 MG tablet Take 1 tablet (10 mg total) by mouth at bedtime.  . Multiple Vitamin (MULTIVITAMIN) capsule Take 1 capsule by mouth daily.    . Needles & Syringes MISC Minimum of 20 syringes for the Levemir pen (or one full bag)  . Omega-3 Fatty Acids (FISH OIL) 1000 MG CAPS Take 1 capsule (1,000 mg total) by mouth daily.  . Precision Thin Lancets MISC Use as directed to check sugars twice daily  . simvastatin (ZOCOR) 40 MG tablet Take 1 tablet (40 mg total) by mouth daily.  . vitamin E 400 UNIT capsule Take 400 Units by mouth daily.

## 2013-01-28 NOTE — Addendum Note (Signed)
Addended by: Len Blalock on: 01/28/2013 11:29 AM   Modules accepted: Orders

## 2013-01-30 ENCOUNTER — Ambulatory Visit: Payer: Self-pay | Admitting: Internal Medicine

## 2013-01-30 DIAGNOSIS — E119 Type 2 diabetes mellitus without complications: Secondary | ICD-10-CM | POA: Diagnosis not present

## 2013-01-30 DIAGNOSIS — Z7189 Other specified counseling: Secondary | ICD-10-CM | POA: Diagnosis not present

## 2013-01-31 ENCOUNTER — Encounter: Payer: Self-pay | Admitting: Internal Medicine

## 2013-02-01 ENCOUNTER — Encounter: Payer: Self-pay | Admitting: Internal Medicine

## 2013-02-03 ENCOUNTER — Ambulatory Visit: Payer: Self-pay | Admitting: Internal Medicine

## 2013-02-04 ENCOUNTER — Other Ambulatory Visit: Payer: Self-pay | Admitting: Internal Medicine

## 2013-02-04 DIAGNOSIS — E119 Type 2 diabetes mellitus without complications: Secondary | ICD-10-CM

## 2013-02-04 NOTE — Telephone Encounter (Signed)
Can you please place the orders for her labs?

## 2013-02-06 ENCOUNTER — Other Ambulatory Visit (INDEPENDENT_AMBULATORY_CARE_PROVIDER_SITE_OTHER): Payer: Medicare Other

## 2013-02-06 DIAGNOSIS — E119 Type 2 diabetes mellitus without complications: Secondary | ICD-10-CM

## 2013-02-06 LAB — COMPREHENSIVE METABOLIC PANEL
ALK PHOS: 73 U/L (ref 39–117)
ALT: 31 U/L (ref 0–35)
AST: 29 U/L (ref 0–37)
Albumin: 4 g/dL (ref 3.5–5.2)
BUN: 16 mg/dL (ref 6–23)
CO2: 28 mEq/L (ref 19–32)
Calcium: 9.1 mg/dL (ref 8.4–10.5)
Chloride: 99 mEq/L (ref 96–112)
Creatinine, Ser: 0.6 mg/dL (ref 0.4–1.2)
GFR: 98 mL/min (ref >60.00)
GLUCOSE: 118 mg/dL — AB (ref 70–99)
POTASSIUM: 3.6 meq/L (ref 3.5–5.1)
SODIUM: 138 meq/L (ref 135–145)
TOTAL PROTEIN: 6.8 g/dL (ref 6.0–8.3)
Total Bilirubin: 0.8 mg/dL (ref 0.3–1.2)

## 2013-02-06 LAB — HEMOGLOBIN A1C: HEMOGLOBIN A1C: 6.3 % (ref 4.6–6.5)

## 2013-02-07 ENCOUNTER — Encounter: Payer: Self-pay | Admitting: Internal Medicine

## 2013-03-01 ENCOUNTER — Telehealth: Payer: Self-pay | Admitting: Pulmonary Disease

## 2013-03-01 ENCOUNTER — Encounter: Payer: Self-pay | Admitting: Pulmonary Disease

## 2013-03-01 ENCOUNTER — Ambulatory Visit (INDEPENDENT_AMBULATORY_CARE_PROVIDER_SITE_OTHER): Payer: Medicare Other | Admitting: Pulmonary Disease

## 2013-03-01 VITALS — BP 122/66 | HR 86 | Temp 98.4°F | Ht 59.0 in | Wt 134.0 lb

## 2013-03-01 DIAGNOSIS — J209 Acute bronchitis, unspecified: Secondary | ICD-10-CM

## 2013-03-01 DIAGNOSIS — J208 Acute bronchitis due to other specified organisms: Secondary | ICD-10-CM | POA: Insufficient documentation

## 2013-03-01 MED ORDER — BENZONATATE 100 MG PO CAPS: 100.0000 mg | ORAL_CAPSULE | Freq: Three times a day (TID) | ORAL | Status: DC | PRN

## 2013-03-01 NOTE — Progress Notes (Signed)
Subjective:    Patient ID: Emily Ryan, female    DOB: [birth year], 68 y.o.   MRN: [id number]  Synopsis: Emily Ryan first saw the [facility] clinic in 06/2012 for evaluation of annual bronchitis, allergic rhinitis and possible asthma.  Simple spirometry was normal. She did well with step down therapy from Symbicort to Asmanex. She had a methacholine challenge which was normal in July 2014.   HPI   06/26/2012 ROV >> Emily Ryan has done well with step down to Asmanex from the symbicort. She has not had cough, shortness of breath, or had to use her albuterol inhaler.  Her sinus symptoms are minimal.  She denies fevers, chills, or other changes.  In general, she feels like she is doing very well.  01/28/2013 ROV >> Emily Ryan has been doing really well since the last visit. She continues to take the sinuglair daily and she hasn't had any colds or URI's since the last visit.  She has not had respiratory symptoms at all since the last visit. She continue to take the Claritin D daily.    03/01/2013 ROV >> Emily Ryan developd a cough about 6 days ago which kept her up at night which was associated with wheezing.  She took robitussin with dextromethorphan. She didn't have much runny nose or itchy eyes.  Yesterday she had more mucus production.  No fevers or chills.  Maybe she has had some mild dyspnea with coughing spells.  No sick contacts.   Past Medical History  Diagnosis Date  . Hyperlipidemia   . Hypertension   . Menopausal disorder   . Complicated pregnancy     1st pregnancy complicated by post operative hemorrhage and 2ng complicated by epidural  . Diabetes mellitus   . Diverticulosis of colon (without mention of hemorrhage)   . Guttate psoriasis      Review of Systems      Objective:   Physical Exam   Filed Vitals:   03/01/13 1356 03/01/13 1407  BP: 122/66   Pulse:  86  Temp: 98.4 F (36.9 C)   TempSrc: Oral   Height: 4' 11"  (1.499 m)   Weight: 134 lb (60.782 kg)   SpO2:   95%  RA  Gen: well appearing, no acute distress HEENT: NCAT,  EOMi, OP clear,  PULM: CTA B CV: RRR, no mgr, no JVD AB: soft, nontender, no hsm Ext: warm, no edema, no clubbing, no cyanosis  PFTS done at [facility] May 2014    FVC 2.44L  105% predFEV 1 1.96L (119% predicted) FEV1/FVC : 80 % pred DLCO 111% pred TLC 4.12L  102%  RV 1.66L  112%   06/2012 ACQ (Asmanex) 0/7  07/2012 spiro PFT> ratio 82%, FEV 1 1.98L (105% pred), no change with BD 07/2012 methacholine challenge> no change with ay dose. Negative study      Assessment & Plan:   Acute viral bronchitis She has acute viral bronchitis without further evidence of pneumonia or upper airway problems. As noted before, she does not have asthma.  Plan: -supportive care reviewed -Tessalon Perles as needed -Dextromethorphan over-the-counter as needed -If no improvement or if worsening purulent mucus and call us for an antibiotic    Updated Medication List Outpatient Encounter Prescriptions as of 03/01/2013  Medication Sig  . Blood Glucose Monitoring Suppl (PRECISION XTRA) KIT For use two times daily  . Calcium Carb-Cholecalciferol 600-100 MG-UNIT CAPS Take 1 tablet by mouth daily. Two by mouth daily  . esomeprazole (NEXIUM) 20 MG capsule Take 1 capsule (  20 mg total) by mouth daily before breakfast.  . folic acid (FOLVITE) 1 MG tablet Take 1 mg by mouth daily.  Marland Kitchen glucose blood (PRECISION XTRA TEST STRIPS) test strip Use as instructed to test sugars twice daily  . hydrochlorothiazide (HYDRODIURIL) 12.5 MG tablet Take 1 tablet (12.5 mg total) by mouth daily.  Marland Kitchen lisinopril (PRINIVIL,ZESTRIL) 20 MG tablet Take 1 tablet (20 mg total) by mouth daily.  Marland Kitchen loratadine (CLARITIN) 10 MG tablet Take 1 tablet (10 mg total) by mouth daily.  . metFORMIN (GLUCOPHAGE XR) 750 MG 24 hr tablet Take 1 tablet (750 mg total) by mouth daily with breakfast.  . methotrexate (RHEUMATREX) 2.5 MG tablet Take 15 mg by mouth once a week.  . montelukast (SINGULAIR)  10 MG tablet Take 1 tablet (10 mg total) by mouth at bedtime.  . Multiple Vitamin (MULTIVITAMIN) capsule Take 1 capsule by mouth daily.    . Omega-3 Fatty Acids (FISH OIL) 1000 MG CAPS Take 1 capsule (1,000 mg total) by mouth daily.  . Precision Thin Lancets MISC Use as directed to check sugars twice daily  . simvastatin (ZOCOR) 40 MG tablet Take 1 tablet (40 mg total) by mouth daily.  . vitamin E 400 UNIT capsule Take 400 Units by mouth daily.

## 2013-03-01 NOTE — Assessment & Plan Note (Signed)
She has acute viral bronchitis without further evidence of pneumonia or upper airway problems. As noted before, she does not have asthma.  Plan: -supportive care reviewed -Tessalon Perles as needed -Dextromethorphan over-the-counter as needed -If no improvement or if worsening purulent mucus and call us for an antibiotic

## 2013-03-01 NOTE — Patient Instructions (Addendum)
Continue using the dextromethorphan containing cough syrup as directed If the cough isn't getting better, use the tessalon perles three times per day as needed for cough Try hard to suppress your cough, don't clear your throat, use warm beverages and hard (non-mint or menthol) candies to soothe your throat. If you cough up mucus that is green or yellow let me know  We will see you back as previously scheduled

## 2013-03-01 NOTE — Telephone Encounter (Signed)
Verified Tessalon 100mg .  Nothing further needed.

## 2013-03-13 ENCOUNTER — Encounter: Payer: Self-pay | Admitting: Internal Medicine

## 2013-03-15 ENCOUNTER — Other Ambulatory Visit: Payer: Self-pay | Admitting: Internal Medicine

## 2013-03-21 DIAGNOSIS — L408 Other psoriasis: Secondary | ICD-10-CM | POA: Diagnosis not present

## 2013-03-21 DIAGNOSIS — Z85828 Personal history of other malignant neoplasm of skin: Secondary | ICD-10-CM | POA: Diagnosis not present

## 2013-03-21 DIAGNOSIS — L570 Actinic keratosis: Secondary | ICD-10-CM | POA: Diagnosis not present

## 2013-03-28 DIAGNOSIS — Z79899 Other long term (current) drug therapy: Secondary | ICD-10-CM | POA: Diagnosis not present

## 2013-03-28 DIAGNOSIS — L408 Other psoriasis: Secondary | ICD-10-CM | POA: Diagnosis not present

## 2013-05-02 DIAGNOSIS — K5900 Constipation, unspecified: Secondary | ICD-10-CM | POA: Diagnosis not present

## 2013-05-02 DIAGNOSIS — K219 Gastro-esophageal reflux disease without esophagitis: Secondary | ICD-10-CM | POA: Diagnosis not present

## 2013-05-02 DIAGNOSIS — K5731 Diverticulosis of large intestine without perforation or abscess with bleeding: Secondary | ICD-10-CM | POA: Diagnosis not present

## 2013-05-02 DIAGNOSIS — Z1211 Encounter for screening for malignant neoplasm of colon: Secondary | ICD-10-CM | POA: Diagnosis not present

## 2013-05-06 ENCOUNTER — Encounter: Payer: Self-pay | Admitting: Internal Medicine

## 2013-05-06 DIAGNOSIS — E119 Type 2 diabetes mellitus without complications: Secondary | ICD-10-CM

## 2013-05-07 ENCOUNTER — Other Ambulatory Visit: Payer: Self-pay | Admitting: Internal Medicine

## 2013-05-08 ENCOUNTER — Other Ambulatory Visit (INDEPENDENT_AMBULATORY_CARE_PROVIDER_SITE_OTHER): Payer: Medicare Other

## 2013-05-08 DIAGNOSIS — E119 Type 2 diabetes mellitus without complications: Secondary | ICD-10-CM | POA: Diagnosis not present

## 2013-05-08 LAB — HEMOGLOBIN A1C: HEMOGLOBIN A1C: 6.5 % (ref 4.6–6.5)

## 2013-05-08 LAB — LIPID PANEL
CHOLESTEROL: 169 mg/dL (ref 0–200)
HDL: 53.7 mg/dL (ref >39.00)
LDL Cholesterol: 95 mg/dL (ref 0–99)
Total CHOL/HDL Ratio: 3
Triglycerides: 102 mg/dL (ref 0.0–149.0)
VLDL: 20.4 mg/dL (ref 0.0–40.0)

## 2013-05-08 LAB — COMPREHENSIVE METABOLIC PANEL
ALK PHOS: 63 U/L (ref 39–117)
ALT: 28 U/L (ref 0–35)
AST: 27 U/L (ref 0–37)
Albumin: 3.8 g/dL (ref 3.5–5.2)
BUN: 15 mg/dL (ref 6–23)
CO2: 30 mEq/L (ref 19–32)
Calcium: 9.1 mg/dL (ref 8.4–10.5)
Chloride: 102 mEq/L (ref 96–112)
Creatinine, Ser: 0.6 mg/dL (ref 0.4–1.2)
GFR: 109.71 mL/min (ref >60.00)
Glucose, Bld: 116 mg/dL — ABNORMAL HIGH (ref 70–99)
Potassium: 4.4 mEq/L (ref 3.5–5.1)
Sodium: 138 mEq/L (ref 135–145)
Total Bilirubin: 0.5 mg/dL (ref 0.2–1.2)
Total Protein: 6.3 g/dL (ref 6.0–8.3)

## 2013-05-08 LAB — MICROALBUMIN / CREATININE URINE RATIO
Creatinine,U: 42.7 mg/dL
MICROALB UR: 0.2 mg/dL (ref 0.0–1.9)
Microalb Creat Ratio: 0.5 mg/g (ref 0.0–30.0)

## 2013-05-10 ENCOUNTER — Ambulatory Visit (INDEPENDENT_AMBULATORY_CARE_PROVIDER_SITE_OTHER): Payer: Medicare Other | Admitting: Internal Medicine

## 2013-05-10 ENCOUNTER — Encounter: Payer: Self-pay | Admitting: Internal Medicine

## 2013-05-10 VITALS — BP 122/60 | HR 76 | Temp 97.9°F | Resp 16 | Ht 58.75 in | Wt 131.5 lb

## 2013-05-10 DIAGNOSIS — I10 Essential (primary) hypertension: Secondary | ICD-10-CM | POA: Diagnosis not present

## 2013-05-10 DIAGNOSIS — E785 Hyperlipidemia, unspecified: Secondary | ICD-10-CM | POA: Diagnosis not present

## 2013-05-10 DIAGNOSIS — Z1239 Encounter for other screening for malignant neoplasm of breast: Secondary | ICD-10-CM

## 2013-05-10 DIAGNOSIS — E119 Type 2 diabetes mellitus without complications: Secondary | ICD-10-CM

## 2013-05-10 DIAGNOSIS — Z8262 Family history of osteoporosis: Secondary | ICD-10-CM | POA: Diagnosis not present

## 2013-05-10 LAB — HM DIABETES FOOT EXAM: HM DIABETIC FOOT EXAM: NORMAL

## 2013-05-10 MED ORDER — ESCITALOPRAM OXALATE 5 MG PO TABS: 5.0000 mg | ORAL_TABLET | Freq: Every day | ORAL | Status: DC

## 2013-05-10 NOTE — Progress Notes (Signed)
Pre-visit discussion using our clinic review tool. No additional management support is needed unless otherwise documented below in the visit note.  

## 2013-05-10 NOTE — Patient Instructions (Signed)
.  You had your annual  wellness exam today.    We will schedule your mammogram  And DEXA soon  Your labs including diabetes are under excellent control!!  Trial of lexapro 5 mg daily for your anxiety and facial flushing

## 2013-05-10 NOTE — Progress Notes (Signed)
Patient ID: Emily Ryan, female   DOB: 1945-01-19, 68 y.o.   MRN: [id number]  . Patient Active Problem List   Diagnosis Date Noted  . Acute viral bronchitis 03/01/2013  . Allergic rhinitis 01/28/2013  . Basal cell carcinoma of left side of nose 04/28/2012  . Leukocytosis, unspecified 04/26/2012  . Diabetes mellitus, type II 10/21/2011  . Encounter for long-term (current) use of other medications 10/21/2011  . Routine general medical examination at a health care facility 07/18/2011  . Guttate psoriasis 04/04/2011  . Constipation 01/19/2011  . Reflux esophagitis 01/19/2011  . Diverticulosis of sigmoid colon 11/10/2010  . Gastritis 09/09/2010  . Hypertension 09/07/2010  . Hyperlipidemia 09/07/2010    Subjective:  CC:   Chief Complaint  Patient presents with  . Annual Exam    HPI:   Emily Ryan is a 68 y.o. female who presents for  6 month follow up on well controlled DM,  Hypertension , hyperlipidemia and GERD.  She has no new complaints , is checking her blood sugars daily ,  following a low GI diet and exercising daily.  She sees isenstein every 3 months for  History of psoriasis and skin Ca    Past Medical History  Diagnosis Date  . Hyperlipidemia   . Hypertension   . Menopausal disorder   . Complicated pregnancy     1st pregnancy complicated by post operative hemorrhage and 2ng complicated by epidural  . Diabetes mellitus   . Diverticulosis of colon (without mention of hemorrhage)   . Guttate psoriasis     Past Surgical History  Procedure Laterality Date  . Hernia repair    . Varicose vein surgery  1974    right leg   . Tonsillectomy  1953  . Septoplasty  1969  . Abdominal hysterectomy    . Bladder repair  1991    lift   . Rectocele repair    . Cystocele repair    . Partial colectomy  Nov 2012    left, secondary to diverticular perf  . Appendectomy  Dec 2012       The following portions of the patient's history were reviewed and updated as  appropriate: Allergies, current medications, and problem list.    Review of Systems:   Patient denies headache, fevers, malaise, unintentional weight loss, skin rash, eye pain, sinus congestion and sinus pain, sore throat, dysphagia,  hemoptysis , cough, dyspnea, wheezing, chest pain, palpitations, orthopnea, edema, abdominal pain, nausea, melena, diarrhea, constipation, flank pain, dysuria, hematuria, urinary  Frequency, nocturia, numbness, tingling, seizures,  Focal weakness, Loss of consciousness,  Tremor, insomnia, depression, anxiety, and suicidal ideation.     History   Social History  . Marital Status: Married    Spouse Name: N/A    Number of Children: N/A  . Years of Education: N/A   Occupational History  . Not on file.   Social History Main Topics  . Smoking status: Never Smoker   . Smokeless tobacco: Never Used  . Alcohol Use: 3.0 oz/week    5 Glasses of wine per week     Comment: Occasional  . Drug Use: No  . Sexual Activity: Yes    Birth Control/ Protection: Post-menopausal   Other Topics Concern  . Not on file   Social History Narrative  . No narrative on file    Objective:  Filed Vitals:   05/10/13 0806  BP: 122/60  Pulse: 76  Temp: 97.9 F (36.6 C)  Resp: 16  General appearance: alert, cooperative and appears stated age Ears: normal TM's and external ear canals both ears Throat: lips, mucosa, and tongue normal; teeth and gums normal Neck: no adenopathy, no carotid bruit, supple, symmetrical, trachea midline and thyroid not enlarged, symmetric, no tenderness/mass/nodules Back: symmetric, no curvature. ROM normal. No CVA tenderness. Lungs: clear to auscultation bilaterally Heart: regular rate and rhythm, S1, S2 normal, no murmur, click, rub or gallop Abdomen: soft, non-tender; bowel sounds normal; no masses,  no organomegaly Pulses: 2+ and symmetric Skin: Skin color, texture, turgor normal. No rashes or lesions Lymph nodes: Cervical,  supraclavicular, and axillary nodes normal.  Assessment and Plan:  Hypertension Well controlled on current regimen. Renal function stable, no changes today. Lab Results  Component Value Date   CREATININE 0.6 05/08/2013     Hyperlipidemia LDL and triglycerides are at goal on current medications.  She has no side effects and liver enzymes are normal. No changes today.  Lab Results  Component Value Date   CHOL 169 05/08/2013   HDL 53.70 05/08/2013   LDLCALC 95 05/08/2013   LDLDIRECT 119.7 07/18/2011   TRIG 102.0 05/08/2013   CHOLHDL 3 05/08/2013   Lab Results  Component Value Date   ALT 28 05/08/2013   AST 27 05/08/2013   ALKPHOS 63 05/08/2013   BILITOT 0.5 05/08/2013       Diabetes mellitus, type II Well-controlled on current medications.  hemoglobin A1c has been consistently less than 7.0 . She is up-to-date on eye exams and foot exam is normal .  She has a normal urine microalbumin to creatinine ratio at next visit. she is on the appropriate medications. Lab Results  Component Value Date   HGBA1C 6.5 05/08/2013       Updated Medication List Outpatient Encounter Prescriptions as of 05/10/2013  Medication Sig  . Blood Glucose Monitoring Suppl (PRECISION XTRA) KIT For use two times daily  . Calcium Carb-Cholecalciferol 600-100 MG-UNIT CAPS Take 1 tablet by mouth daily. Two by mouth daily  . esomeprazole (NEXIUM) 20 MG capsule Take 1 capsule (20 mg total) by mouth daily before breakfast.  . folic acid (FOLVITE) 1 MG tablet Take 1 mg by mouth daily.  Marland Kitchen glucose blood (PRECISION XTRA TEST STRIPS) test strip Use as instructed to test sugars twice daily  . hydrochlorothiazide (HYDRODIURIL) 12.5 MG tablet Take 1 tablet (12.5 mg total) by mouth daily.  Marland Kitchen lisinopril (PRINIVIL,ZESTRIL) 20 MG tablet Take 1 tablet (20 mg total) by mouth daily.  Marland Kitchen loratadine (CLARITIN) 10 MG tablet Take 1 tablet (10 mg total) by mouth daily.  . metFORMIN (GLUCOPHAGE XR) 750 MG 24 hr tablet Take 1 tablet (750 mg total)  by mouth daily with breakfast.  . methotrexate (RHEUMATREX) 2.5 MG tablet Take 15 mg by mouth once a week.  . montelukast (SINGULAIR) 10 MG tablet Take 1 tablet (10 mg total) by mouth at bedtime.  . Multiple Vitamin (MULTIVITAMIN) capsule Take 1 capsule by mouth daily.    . Omega-3 Fatty Acids (FISH OIL) 1000 MG CAPS Take 1 capsule (1,000 mg total) by mouth daily.  . Precision Thin Lancets MISC Use as directed to check sugars twice daily  . simvastatin (ZOCOR) 40 MG tablet Take 1 tablet (40 mg total) by mouth daily.  . vitamin E 400 UNIT capsule Take 400 Units by mouth daily.    Marland Kitchen escitalopram (LEXAPRO) 5 MG tablet Take 1 tablet (5 mg total) by mouth daily.  . [DISCONTINUED] benzonatate (TESSALON PERLES) 100 MG capsule Take 1 capsule (  100 mg total) by mouth 3 (three) times daily as needed for cough.     Orders Placed This Encounter  Procedures  . DG Bone Density  . MM DIGITAL SCREENING BILATERAL  . HM DIABETES EYE EXAM  . HM DIABETES FOOT EXAM    No Follow-up on file.

## 2013-05-12 ENCOUNTER — Encounter: Payer: Self-pay | Admitting: Internal Medicine

## 2013-05-12 NOTE — Assessment & Plan Note (Signed)
LDL and triglycerides are at goal on current medications.  She has no side effects and liver enzymes are normal. No changes today.  Lab Results  Component Value Date   CHOL 169 05/08/2013   HDL 53.70 05/08/2013   LDLCALC 95 05/08/2013   LDLDIRECT 119.7 07/18/2011   TRIG 102.0 05/08/2013   CHOLHDL 3 05/08/2013   Lab Results  Component Value Date   ALT 28 05/08/2013   AST 27 05/08/2013   ALKPHOS 63 05/08/2013   BILITOT 0.5 05/08/2013

## 2013-05-12 NOTE — Assessment & Plan Note (Signed)
Well controlled on current regimen. Renal function stable, no changes today. Lab Results  Component Value Date   CREATININE 0.6 05/08/2013

## 2013-05-12 NOTE — Assessment & Plan Note (Signed)
Well-controlled on current medications.  hemoglobin A1c has been consistently less than 7.0 . She is up-to-date on eye exams and foot exam is normal .  She has a normal urine microalbumin to creatinine ratio at next visit. she is on the appropriate medications. Lab Results  Component Value Date   HGBA1C 6.5 05/08/2013

## 2013-05-23 ENCOUNTER — Encounter: Payer: Self-pay | Admitting: Internal Medicine

## 2013-05-30 ENCOUNTER — Telehealth: Payer: Self-pay | Admitting: Internal Medicine

## 2013-05-30 NOTE — Telephone Encounter (Signed)
It is a screening. I have no history of osteopenia in her chart.

## 2013-05-30 NOTE — Telephone Encounter (Signed)
Per [employer] medicare will not pay for screening bone density

## 2013-05-30 NOTE — Telephone Encounter (Signed)
I called to schedule bone density for ms Emily Ryan @ [place] ask the reason.  On the order it stated famil

## 2013-05-30 NOTE — Telephone Encounter (Signed)
I called to schedule bone density for Emily Emily Ryan @ [employer] ask the reason  On the order is says family history of osteoporosis  jamie stated if this is a screening  Medicare does not pay for screening bone density  Does Emily Ryan need screening or another dx

## 2013-05-30 NOTE — Telephone Encounter (Signed)
Screening Mammo

## 2013-05-30 NOTE — Telephone Encounter (Signed)
Please advise,

## 2013-05-31 NOTE — Telephone Encounter (Signed)
The bone density they will not cover if no history of osteopenia, screening mammo yes.

## 2013-05-31 NOTE — Telephone Encounter (Signed)
Please notify patient.

## 2013-05-31 NOTE — Telephone Encounter (Signed)
Left message for patient to return call to office. 

## 2013-06-03 NOTE — Telephone Encounter (Signed)
Patient came into office patient notified,

## 2013-06-10 ENCOUNTER — Telehealth: Payer: Self-pay

## 2013-06-10 NOTE — Telephone Encounter (Signed)
Relevant patient education assigned to patient using Emmi. ° °

## 2013-06-19 DIAGNOSIS — Z85828 Personal history of other malignant neoplasm of skin: Secondary | ICD-10-CM | POA: Diagnosis not present

## 2013-06-19 DIAGNOSIS — L570 Actinic keratosis: Secondary | ICD-10-CM | POA: Diagnosis not present

## 2013-06-19 DIAGNOSIS — L408 Other psoriasis: Secondary | ICD-10-CM | POA: Diagnosis not present

## 2013-06-21 DIAGNOSIS — L408 Other psoriasis: Secondary | ICD-10-CM | POA: Diagnosis not present

## 2013-06-21 DIAGNOSIS — Z79899 Other long term (current) drug therapy: Secondary | ICD-10-CM | POA: Diagnosis not present

## 2013-06-26 LAB — HM DIABETES EYE EXAM

## 2013-06-27 DIAGNOSIS — E119 Type 2 diabetes mellitus without complications: Secondary | ICD-10-CM | POA: Diagnosis not present

## 2013-07-01 ENCOUNTER — Encounter: Payer: Self-pay | Admitting: Internal Medicine

## 2013-07-03 DIAGNOSIS — K56609 Unspecified intestinal obstruction, unspecified as to partial versus complete obstruction: Secondary | ICD-10-CM | POA: Diagnosis not present

## 2013-07-03 DIAGNOSIS — Z800 Family history of malignant neoplasm of digestive organs: Secondary | ICD-10-CM | POA: Diagnosis not present

## 2013-07-04 ENCOUNTER — Encounter: Payer: Self-pay | Admitting: Internal Medicine

## 2013-07-04 ENCOUNTER — Other Ambulatory Visit: Payer: Self-pay | Admitting: Gastroenterology

## 2013-07-04 DIAGNOSIS — L259 Unspecified contact dermatitis, unspecified cause: Secondary | ICD-10-CM | POA: Diagnosis not present

## 2013-07-04 DIAGNOSIS — R933 Abnormal findings on diagnostic imaging of other parts of digestive tract: Secondary | ICD-10-CM

## 2013-07-10 NOTE — Telephone Encounter (Signed)
FYI

## 2013-07-31 ENCOUNTER — Other Ambulatory Visit: Payer: Self-pay | Admitting: Gastroenterology

## 2013-07-31 DIAGNOSIS — Z1211 Encounter for screening for malignant neoplasm of colon: Secondary | ICD-10-CM

## 2013-07-31 DIAGNOSIS — R933 Abnormal findings on diagnostic imaging of other parts of digestive tract: Secondary | ICD-10-CM

## 2013-07-31 DIAGNOSIS — K5730 Diverticulosis of large intestine without perforation or abscess without bleeding: Secondary | ICD-10-CM

## 2013-07-31 DIAGNOSIS — K56699 Other intestinal obstruction unspecified as to partial versus complete obstruction: Secondary | ICD-10-CM

## 2013-08-15 ENCOUNTER — Ambulatory Visit
Admission: RE | Admit: 2013-08-15 | Discharge: 2013-08-15 | Disposition: A | Discharge status: Home / Self Care | Payer: Medicare Other | Source: Ambulatory Visit | Attending: Gastroenterology | Admitting: Gastroenterology

## 2013-08-15 DIAGNOSIS — K5730 Diverticulosis of large intestine without perforation or abscess without bleeding: Secondary | ICD-10-CM | POA: Diagnosis not present

## 2013-08-15 DIAGNOSIS — K449 Diaphragmatic hernia without obstruction or gangrene: Secondary | ICD-10-CM | POA: Diagnosis not present

## 2013-08-19 ENCOUNTER — Encounter: Payer: Self-pay | Admitting: Internal Medicine

## 2013-08-22 ENCOUNTER — Ambulatory Visit: Payer: Self-pay | Admitting: Internal Medicine

## 2013-08-22 DIAGNOSIS — Z1231 Encounter for screening mammogram for malignant neoplasm of breast: Secondary | ICD-10-CM | POA: Diagnosis not present

## 2013-08-22 DIAGNOSIS — R928 Other abnormal and inconclusive findings on diagnostic imaging of breast: Secondary | ICD-10-CM | POA: Diagnosis not present

## 2013-08-28 ENCOUNTER — Ambulatory Visit: Payer: Self-pay | Admitting: Internal Medicine

## 2013-08-28 ENCOUNTER — Telehealth: Payer: Self-pay | Admitting: Internal Medicine

## 2013-08-28 DIAGNOSIS — N6459 Other signs and symptoms in breast: Secondary | ICD-10-CM | POA: Diagnosis not present

## 2013-08-28 DIAGNOSIS — R922 Inconclusive mammogram: Secondary | ICD-10-CM | POA: Diagnosis not present

## 2013-08-28 DIAGNOSIS — N6489 Other specified disorders of breast: Secondary | ICD-10-CM | POA: Diagnosis not present

## 2013-08-28 NOTE — Telephone Encounter (Signed)
Her mammogram was incomplete on the right.  They will contact her for  additional images, which I will order.  I prefer to choose the surgeon with the patient if a biopsy is recommended, so if they tell her she needs a biopsy, she can tell them she is going to discuss it with me first.

## 2013-08-30 NOTE — Telephone Encounter (Signed)
Left message for patient to return call to office. 

## 2013-09-02 NOTE — Telephone Encounter (Signed)
Left message for patient to call office.  

## 2013-09-02 NOTE — Telephone Encounter (Signed)
Patient has had an [place] on breast but was told no further intervention needed to follow up with repeat in 6 months.

## 2013-09-04 ENCOUNTER — Telehealth: Payer: Self-pay | Admitting: Internal Medicine

## 2013-09-04 ENCOUNTER — Encounter: Payer: Self-pay | Admitting: Internal Medicine

## 2013-09-04 MED ORDER — MONTELUKAST SODIUM 10 MG PO TABS: 10.0000 mg | ORAL_TABLET | Freq: Every day | ORAL | Status: DC

## 2013-09-04 NOTE — Telephone Encounter (Signed)
Patient needed script printed to take to [facility] have placed in quick sign folder.

## 2013-09-05 ENCOUNTER — Other Ambulatory Visit: Payer: Self-pay | Admitting: *Deleted

## 2013-09-05 MED ORDER — MONTELUKAST SODIUM 10 MG PO TABS: 10.0000 mg | ORAL_TABLET | Freq: Every day | ORAL | Status: DC

## 2013-09-17 ENCOUNTER — Encounter: Payer: Self-pay | Admitting: Internal Medicine

## 2013-09-18 ENCOUNTER — Encounter: Payer: Self-pay | Admitting: Internal Medicine

## 2013-09-19 DIAGNOSIS — D235 Other benign neoplasm of skin of trunk: Secondary | ICD-10-CM | POA: Diagnosis not present

## 2013-09-19 DIAGNOSIS — Z85828 Personal history of other malignant neoplasm of skin: Secondary | ICD-10-CM | POA: Diagnosis not present

## 2013-09-19 DIAGNOSIS — L570 Actinic keratosis: Secondary | ICD-10-CM | POA: Diagnosis not present

## 2013-09-19 DIAGNOSIS — L408 Other psoriasis: Secondary | ICD-10-CM | POA: Diagnosis not present

## 2013-09-23 ENCOUNTER — Telehealth: Payer: Self-pay | Admitting: *Deleted

## 2013-09-23 DIAGNOSIS — E119 Type 2 diabetes mellitus without complications: Secondary | ICD-10-CM

## 2013-09-23 NOTE — Telephone Encounter (Signed)
Pt is coming in tomorrow what labs and dx?  

## 2013-09-24 ENCOUNTER — Other Ambulatory Visit (INDEPENDENT_AMBULATORY_CARE_PROVIDER_SITE_OTHER): Payer: Medicare Other

## 2013-09-24 ENCOUNTER — Encounter: Payer: Self-pay | Admitting: Internal Medicine

## 2013-09-24 DIAGNOSIS — E119 Type 2 diabetes mellitus without complications: Secondary | ICD-10-CM

## 2013-09-24 LAB — LIPID PANEL
CHOL/HDL RATIO: 3
Cholesterol: 165 mg/dL (ref 0–200)
HDL: 57.6 mg/dL (ref >39.00)
LDL CALC: 84 mg/dL (ref 0–99)
NonHDL: 107.4
TRIGLYCERIDES: 117 mg/dL (ref 0.0–149.0)
VLDL: 23.4 mg/dL (ref 0.0–40.0)

## 2013-09-24 LAB — COMPREHENSIVE METABOLIC PANEL
ALT: 28 U/L (ref 0–35)
AST: 28 U/L (ref 0–37)
Albumin: 4 g/dL (ref 3.5–5.2)
Alkaline Phosphatase: 68 U/L (ref 39–117)
BILIRUBIN TOTAL: 0.8 mg/dL (ref 0.2–1.2)
BUN: 13 mg/dL (ref 6–23)
CO2: 31 mEq/L (ref 19–32)
Calcium: 9 mg/dL (ref 8.4–10.5)
Chloride: 101 mEq/L (ref 96–112)
Creatinine, Ser: 0.7 mg/dL (ref 0.4–1.2)
GFR: 94.41 mL/min (ref >60.00)
GLUCOSE: 118 mg/dL — AB (ref 70–99)
POTASSIUM: 4.2 meq/L (ref 3.5–5.1)
Sodium: 138 mEq/L (ref 135–145)
Total Protein: 7.1 g/dL (ref 6.0–8.3)

## 2013-09-24 LAB — HEMOGLOBIN A1C: Hgb A1c MFr Bld: 6.4 % (ref 4.6–6.5)

## 2013-09-26 ENCOUNTER — Ambulatory Visit (INDEPENDENT_AMBULATORY_CARE_PROVIDER_SITE_OTHER): Payer: Medicare Other | Admitting: Internal Medicine

## 2013-09-26 ENCOUNTER — Encounter: Payer: Self-pay | Admitting: Internal Medicine

## 2013-09-26 VITALS — BP 120/60 | HR 78 | Temp 98.2°F | Resp 16 | Ht 58.75 in | Wt 130.5 lb

## 2013-09-26 DIAGNOSIS — N6489 Other specified disorders of breast: Secondary | ICD-10-CM

## 2013-09-26 DIAGNOSIS — E785 Hyperlipidemia, unspecified: Secondary | ICD-10-CM

## 2013-09-26 DIAGNOSIS — K219 Gastro-esophageal reflux disease without esophagitis: Secondary | ICD-10-CM | POA: Diagnosis not present

## 2013-09-26 DIAGNOSIS — IMO0001 Reserved for inherently not codable concepts without codable children: Secondary | ICD-10-CM

## 2013-09-26 DIAGNOSIS — E119 Type 2 diabetes mellitus without complications: Secondary | ICD-10-CM | POA: Diagnosis not present

## 2013-09-26 DIAGNOSIS — Z23 Encounter for immunization: Secondary | ICD-10-CM

## 2013-09-26 DIAGNOSIS — I10 Essential (primary) hypertension: Secondary | ICD-10-CM

## 2013-09-26 LAB — HM DIABETES FOOT EXAM: HM Diabetic Foot Exam: NORMAL

## 2013-09-26 MED ORDER — SIMVASTATIN 40 MG PO TABS: 40.0000 mg | ORAL_TABLET | Freq: Every day | ORAL | Status: DC

## 2013-09-26 MED ORDER — LISINOPRIL 20 MG PO TABS: 20.0000 mg | ORAL_TABLET | Freq: Every day | ORAL | Status: DC

## 2013-09-26 MED ORDER — METFORMIN HCL ER 750 MG PO TB24: 750.0000 mg | ORAL_TABLET | Freq: Every day | ORAL | Status: DC

## 2013-09-26 MED ORDER — LORATADINE 10 MG PO TABS: 10.0000 mg | ORAL_TABLET | Freq: Every day | ORAL | Status: DC

## 2013-09-26 MED ORDER — ESCITALOPRAM OXALATE 5 MG PO TABS: 5.0000 mg | ORAL_TABLET | Freq: Every day | ORAL | Status: DC

## 2013-09-26 MED ORDER — ESOMEPRAZOLE MAGNESIUM 20 MG PO CPDR: 20.0000 mg | DELAYED_RELEASE_CAPSULE | Freq: Every day | ORAL | Status: DC

## 2013-09-26 MED ORDER — HYDROCHLOROTHIAZIDE 12.5 MG PO TABS: 12.5000 mg | ORAL_TABLET | Freq: Every day | ORAL | Status: DC

## 2013-09-26 NOTE — Progress Notes (Signed)
Pre-visit discussion using our clinic review tool. No additional management support is needed unless otherwise documented below in the visit note.  

## 2013-09-26 NOTE — Patient Instructions (Signed)
You are doing great!!  Have past once a week!!  Enjoy!!!!  See you in 4 months

## 2013-09-26 NOTE — Progress Notes (Signed)
Patient ID: Emily Ryan, female   DOB: 1945-05-22, 69 y.o.   MRN: [id number]   Patient Active Problem List   Diagnosis Date Noted  . Acute viral bronchitis 03/01/2013  . Allergic rhinitis 01/28/2013  . Basal cell carcinoma of left side of nose 04/28/2012  . Leukocytosis, unspecified 04/26/2012  . Diabetes mellitus, type II 10/21/2011  . Encounter for long-term (current) use of other medications 10/21/2011  . Routine general medical examination at a health care facility 07/18/2011  . Guttate psoriasis 04/04/2011  . Constipation 01/19/2011  . Reflux esophagitis 01/19/2011  . Diverticulosis of sigmoid colon 11/10/2010  . Gastritis 09/09/2010  . Hypertension 09/07/2010  . Hyperlipidemia 09/07/2010    Subjective:  CC:   Chief Complaint  Patient presents with  . Follow-up    Labs  . Diabetes    HPI:   Emily Ryan is a 68 y.o. female who presents for follow up on Type 2 DM, hypertension and hyperlipidemia.  Blood sugars have been < 130 consistently.  She is exercising regularly and maintaining her weight.  No lows.  No new issues.    Past Medical History  Diagnosis Date  . Hyperlipidemia   . Hypertension   . Menopausal disorder   . Complicated pregnancy     1st pregnancy complicated by post operative hemorrhage and 2ng complicated by epidural  . Diabetes mellitus   . Diverticulosis of colon (without mention of hemorrhage)   . Guttate psoriasis     Past Surgical History  Procedure Laterality Date  . Hernia repair    . Varicose vein surgery  1974    right leg   . Tonsillectomy  1953  . Septoplasty  1969  . Abdominal hysterectomy    . Bladder repair  1991    lift   . Rectocele repair    . Cystocele repair    . Partial colectomy  Nov 2012    left, secondary to diverticular perf  . Appendectomy  Dec 2012       The following portions of the patient's history were reviewed and updated as appropriate: Allergies, current medications, and problem  list.    Review of Systems:   Patient denies headache, fevers, malaise, unintentional weight loss, skin rash, eye pain, sinus congestion and sinus pain, sore throat, dysphagia,  hemoptysis , cough, dyspnea, wheezing, chest pain, palpitations, orthopnea, edema, abdominal pain, nausea, melena, diarrhea, constipation, flank pain, dysuria, hematuria, urinary  Frequency, nocturia, numbness, tingling, seizures,  Focal weakness, Loss of consciousness,  Tremor, insomnia, depression, anxiety, and suicidal ideation.     History   Social History  . Marital Status: Married    Spouse Name: N/A    Number of Children: N/A  . Years of Education: N/A   Occupational History  . Not on file.   Social History Main Topics  . Smoking status: Never Smoker   . Smokeless tobacco: Never Used  . Alcohol Use: 3.0 oz/week    5 Glasses of wine per week     Comment: Occasional  . Drug Use: No  . Sexual Activity: Yes    Birth Control/ Protection: Post-menopausal   Other Topics Concern  . Not on file   Social History Narrative  . No narrative on file    Objective:  Filed Vitals:   09/26/13 0805  BP: 120/60  Pulse: 78  Temp: 98.2 F (36.8 C)  Resp: 16     General appearance: alert, cooperative and appears stated age Ears: normal TM's and  external ear canals both ears Throat: lips, mucosa, and tongue normal; teeth and gums normal Neck: no adenopathy, no carotid bruit, supple, symmetrical, trachea midline and thyroid not enlarged, symmetric, no tenderness/mass/nodules Back: symmetric, no curvature. ROM normal. No CVA tenderness. Lungs: clear to auscultation bilaterally Heart: regular rate and rhythm, S1, S2 normal, no murmur, click, rub or gallop Abdomen: soft, non-tender; bowel sounds normal; no masses,  no organomegaly Pulses: 2+ and symmetric Skin: Skin color, texture, turgor normal. No rashes or lesions Lymph nodes: Cervical, supraclavicular, and axillary nodes normal.  Assessment and  Plan:  Diabetes mellitus, type II  Historically well-controlled .  hemoglobin A1c has been consistently at or  less than 7.0 . Patient is up-to-date on eye exams and foot exam is normal today. Patient has no microalbuminuria. Patient is tolerating statin therapy for CAD risk reduction and on ACE/ARB for renal protection and hypertension   Lab Results  Component Value Date   HGBA1C 6.4 09/24/2013   Lab Results  Component Value Date   MICROALBUR 0.2 05/08/2013     Hyperlipidemia LDL and triglycerides are at goal on current medications.  She has no side effects and liver enzymes are normal. No changes today.  Lab Results  Component Value Date   CHOL 165 09/24/2013   HDL 57.60 09/24/2013   LDLCALC 84 09/24/2013   LDLDIRECT 119.7 07/18/2011   TRIG 117.0 09/24/2013   CHOLHDL 3 09/24/2013   Lab Results  Component Value Date   ALT 28 09/24/2013   AST 28 09/24/2013   ALKPHOS 68 09/24/2013   BILITOT 0.8 09/24/2013         Hypertension Well controlled on current regimen. Renal function stable, no changes today.  Lab Results  Component Value Date   CREATININE 0.7 09/24/2013   Lab Results  Component Value Date   NA 138 09/24/2013   K 4.2 09/24/2013   CL 101 09/24/2013   CO2 31 09/24/2013      Updated Medication List Outpatient Encounter Prescriptions as of 09/26/2013  Medication Sig  . Blood Glucose Monitoring Suppl (PRECISION XTRA) KIT For use two times daily  . Calcium Carb-Cholecalciferol 600-100 MG-UNIT CAPS Take 1 tablet by mouth daily. Two by mouth daily  . escitalopram (LEXAPRO) 5 MG tablet Take 1 tablet (5 mg total) by mouth daily.  . folic acid (FOLVITE) 1 MG tablet Take 1 mg by mouth daily.  Marland Kitchen glucose blood (PRECISION XTRA TEST STRIPS) test strip Use as instructed to test sugars twice daily  . hydrochlorothiazide (HYDRODIURIL) 12.5 MG tablet Take 1 tablet (12.5 mg total) by mouth daily.  Marland Kitchen lisinopril (PRINIVIL,ZESTRIL) 20 MG tablet Take 1 tablet (20 mg total) by mouth  daily.  Marland Kitchen loratadine (CLARITIN) 10 MG tablet Take 1 tablet (10 mg total) by mouth daily.  . metFORMIN (GLUCOPHAGE XR) 750 MG 24 hr tablet Take 1 tablet (750 mg total) by mouth daily with breakfast.  . methotrexate (RHEUMATREX) 2.5 MG tablet Take 15 mg by mouth once a week.  . montelukast (SINGULAIR) 10 MG tablet Take 1 tablet (10 mg total) by mouth at bedtime.  . Multiple Vitamin (MULTIVITAMIN) capsule Take 1 capsule by mouth daily.    . Omega-3 Fatty Acids (FISH OIL) 1000 MG CAPS Take 1 capsule (1,000 mg total) by mouth daily.  . Precision Thin Lancets MISC Use as directed to check sugars twice daily  . simvastatin (ZOCOR) 40 MG tablet Take 1 tablet (40 mg total) by mouth daily.  . vitamin E 400 UNIT capsule  Take 400 Units by mouth daily.    . [DISCONTINUED] escitalopram (LEXAPRO) 5 MG tablet Take 1 tablet (5 mg total) by mouth daily.  . [DISCONTINUED] escitalopram (LEXAPRO) 5 MG tablet Take 1 tablet (5 mg total) by mouth daily.  . [DISCONTINUED] hydrochlorothiazide (HYDRODIURIL) 12.5 MG tablet Take 1 tablet (12.5 mg total) by mouth daily.  . [DISCONTINUED] lisinopril (PRINIVIL,ZESTRIL) 20 MG tablet Take 1 tablet (20 mg total) by mouth daily.  . [DISCONTINUED] loratadine (CLARITIN) 10 MG tablet Take 1 tablet (10 mg total) by mouth daily.  . [DISCONTINUED] metFORMIN (GLUCOPHAGE XR) 750 MG 24 hr tablet Take 1 tablet (750 mg total) by mouth daily with breakfast.  . [DISCONTINUED] simvastatin (ZOCOR) 40 MG tablet Take 1 tablet (40 mg total) by mouth daily.  Marland Kitchen esomeprazole (NEXIUM) 20 MG capsule Take 1 capsule (20 mg total) by mouth daily before breakfast.  . [DISCONTINUED] esomeprazole (NEXIUM) 20 MG capsule Take 1 capsule (20 mg total) by mouth daily before breakfast.     Orders Placed This Encounter  Procedures  . MM Digital Diagnostic Unilat R  . HM DIABETES EYE EXAM  . HM DIABETES FOOT EXAM    Return in about 4 months (around 01/26/2014).

## 2013-09-28 NOTE — Assessment & Plan Note (Signed)
Well controlled on current regimen. Renal function stable, no changes today.  Lab Results  Component Value Date   CREATININE 0.7 09/24/2013   Lab Results  Component Value Date   NA 138 09/24/2013   K 4.2 09/24/2013   CL 101 09/24/2013   CO2 31 09/24/2013

## 2013-09-28 NOTE — Assessment & Plan Note (Signed)
Historically well-controlled .  hemoglobin A1c has been consistently at or  less than 7.0 . Patient is up-to-date on eye exams and foot exam is normal today. Patient has no microalbuminuria. Patient is tolerating statin therapy for CAD risk reduction and on ACE/ARB for renal protection and hypertension   Lab Results  Component Value Date   HGBA1C 6.4 09/24/2013   Lab Results  Component Value Date   MICROALBUR 0.2 05/08/2013

## 2013-09-28 NOTE — Assessment & Plan Note (Signed)
LDL and triglycerides are at goal on current medications.  She has no side effects and liver enzymes are normal. No changes today.  Lab Results  Component Value Date   CHOL 165 09/24/2013   HDL 57.60 09/24/2013   LDLCALC 84 09/24/2013   LDLDIRECT 119.7 07/18/2011   TRIG 117.0 09/24/2013   CHOLHDL 3 09/24/2013   Lab Results  Component Value Date   ALT 28 09/24/2013   AST 28 09/24/2013   ALKPHOS 68 09/24/2013   BILITOT 0.8 09/24/2013

## 2013-12-18 DIAGNOSIS — D225 Melanocytic nevi of trunk: Secondary | ICD-10-CM | POA: Diagnosis not present

## 2013-12-18 DIAGNOSIS — L821 Other seborrheic keratosis: Secondary | ICD-10-CM | POA: Diagnosis not present

## 2013-12-18 DIAGNOSIS — D2271 Melanocytic nevi of right lower limb, including hip: Secondary | ICD-10-CM | POA: Diagnosis not present

## 2013-12-18 DIAGNOSIS — L400 Psoriasis vulgaris: Secondary | ICD-10-CM | POA: Diagnosis not present

## 2014-01-01 ENCOUNTER — Encounter: Payer: Self-pay | Admitting: Internal Medicine

## 2014-01-04 ENCOUNTER — Other Ambulatory Visit: Payer: Self-pay | Admitting: Internal Medicine

## 2014-01-04 DIAGNOSIS — Z79899 Other long term (current) drug therapy: Secondary | ICD-10-CM

## 2014-01-04 DIAGNOSIS — E119 Type 2 diabetes mellitus without complications: Secondary | ICD-10-CM

## 2014-01-24 ENCOUNTER — Other Ambulatory Visit: Payer: Medicare Other

## 2014-01-27 ENCOUNTER — Encounter: Payer: Self-pay | Admitting: Internal Medicine

## 2014-01-27 ENCOUNTER — Ambulatory Visit (INDEPENDENT_AMBULATORY_CARE_PROVIDER_SITE_OTHER): Payer: Medicare Other | Admitting: Internal Medicine

## 2014-01-27 ENCOUNTER — Other Ambulatory Visit (INDEPENDENT_AMBULATORY_CARE_PROVIDER_SITE_OTHER): Payer: Medicare Other

## 2014-01-27 VITALS — BP 142/60 | HR 72 | Temp 97.2°F | Resp 14 | Ht 59.0 in | Wt 135.5 lb

## 2014-01-27 DIAGNOSIS — E785 Hyperlipidemia, unspecified: Secondary | ICD-10-CM | POA: Diagnosis not present

## 2014-01-27 DIAGNOSIS — E119 Type 2 diabetes mellitus without complications: Secondary | ICD-10-CM

## 2014-01-27 DIAGNOSIS — Z79899 Other long term (current) drug therapy: Secondary | ICD-10-CM | POA: Diagnosis not present

## 2014-01-27 DIAGNOSIS — C44311 Basal cell carcinoma of skin of nose: Secondary | ICD-10-CM | POA: Diagnosis not present

## 2014-01-27 DIAGNOSIS — I10 Essential (primary) hypertension: Secondary | ICD-10-CM | POA: Diagnosis not present

## 2014-01-27 LAB — COMPREHENSIVE METABOLIC PANEL
ALBUMIN: 4.2 g/dL (ref 3.5–5.2)
ALT: 33 U/L (ref 0–35)
AST: 27 U/L (ref 0–37)
Alkaline Phosphatase: 78 U/L (ref 39–117)
BILIRUBIN TOTAL: 0.6 mg/dL (ref 0.2–1.2)
BUN: 17 mg/dL (ref 6–23)
CALCIUM: 9.3 mg/dL (ref 8.4–10.5)
CO2: 30 mEq/L (ref 19–32)
Chloride: 101 mEq/L (ref 96–112)
Creatinine, Ser: 0.67 mg/dL (ref 0.40–1.20)
GFR: 92.69 mL/min (ref >60.00)
Glucose, Bld: 130 mg/dL — ABNORMAL HIGH (ref 70–99)
Potassium: 4.5 mEq/L (ref 3.5–5.1)
Sodium: 138 mEq/L (ref 135–145)
TOTAL PROTEIN: 6.9 g/dL (ref 6.0–8.3)

## 2014-01-27 LAB — CBC WITH DIFFERENTIAL/PLATELET
BASOS ABS: 0 10*3/uL (ref 0.0–0.1)
BASOS PCT: 0.3 % (ref 0.0–3.0)
Eosinophils Absolute: 0.1 10*3/uL (ref 0.0–0.7)
Eosinophils Relative: 1.3 % (ref 0.0–5.0)
HCT: 40.9 % (ref 36.0–46.0)
Hemoglobin: 14.1 g/dL (ref 12.0–15.0)
LYMPHS PCT: 34.6 % (ref 12.0–46.0)
Lymphs Abs: 1.9 10*3/uL (ref 0.7–4.0)
MCHC: 34.5 g/dL (ref 30.0–36.0)
MCV: 94.5 fl (ref 78.0–100.0)
Monocytes Absolute: 0.6 10*3/uL (ref 0.1–1.0)
Monocytes Relative: 11.6 % (ref 3.0–12.0)
Neutro Abs: 2.9 10*3/uL (ref 1.4–7.7)
Neutrophils Relative %: 52.2 % (ref 43.0–77.0)
Platelets: 242 10*3/uL (ref 150.0–400.0)
RBC: 4.33 Mil/uL (ref 3.87–5.11)
RDW: 14.9 % (ref 11.5–15.5)
WBC: 5.5 10*3/uL (ref 4.0–10.5)

## 2014-01-27 LAB — LIPID PANEL
CHOL/HDL RATIO: 3
Cholesterol: 195 mg/dL (ref 0–200)
HDL: 59.8 mg/dL (ref >39.00)
LDL Cholesterol: 101 mg/dL — ABNORMAL HIGH (ref 0–99)
NONHDL: 135.2
TRIGLYCERIDES: 173 mg/dL — AB (ref 0.0–149.0)
VLDL: 34.6 mg/dL (ref 0.0–40.0)

## 2014-01-27 LAB — HEMOGLOBIN A1C: Hgb A1c MFr Bld: 6.9 % — ABNORMAL HIGH (ref 4.6–6.5)

## 2014-01-27 LAB — HM DIABETES FOOT EXAM: HM Diabetic Foot Exam: NORMAL

## 2014-01-27 MED ORDER — SCOPOLAMINE 1 MG/3DAYS TD PT72: 1.0000 | MEDICATED_PATCH | TRANSDERMAL | Status: DC

## 2014-01-27 MED ORDER — PENCICLOVIR 1 % EX CREA: 1.0000 "application " | TOPICAL_CREAM | CUTANEOUS | Status: DC

## 2014-01-27 NOTE — Progress Notes (Signed)
Patient ID: Emily Ryan, female   DOB: 16-Oct-1945, 69 y.o.   MRN: [id number]  Patient Active Problem List   Diagnosis Date Noted  . Allergic rhinitis 01/28/2013  . Basal cell carcinoma of left side of nose 04/28/2012  . Leukocytosis, unspecified 04/26/2012  . Diabetes mellitus, type II 10/21/2011  . Long-term use of high-risk medication 10/21/2011  . Routine general medical examination at a health care facility 07/18/2011  . Guttate psoriasis 04/04/2011  . Constipation 01/19/2011  . Reflux esophagitis 01/19/2011  . Diverticulosis of sigmoid colon 11/10/2010  . Gastritis 09/09/2010  . Hypertension 09/07/2010  . Hyperlipidemia 09/07/2010    Subjective:  CC:   Chief Complaint  Patient presents with  . Follow-up  . Diabetes    HPI:   Emily Ryan is a 69 y.o. female who presents for  Follow up on Type 2 DM, hyperlipidmemia and hypertension.  Labs to be done today .  Feeling less anxious on the lexapro.,  No side effects noted.  Has been a little indulgent over the holidays and less physically active,  Notes,  Blood sugars have Climbed a little but fastings are still < 150 and her post prandials are < 200.  She is requesting a new meter.   Lab Results  Component Value Date   HGBA1C 6.9* 01/27/2014   Lab Results  Component Value Date   MICROALBUR 0.2 05/08/2013   Lab Results  Component Value Date   CHOL 195 01/27/2014   HDL 59.80 01/27/2014   LDLCALC 101* 01/27/2014   LDLDIRECT 119.7 07/18/2011   TRIG 173.0* 01/27/2014   CHOLHDL 3 01/27/2014     Past Medical History  Diagnosis Date  . Hyperlipidemia   . Hypertension   . Menopausal disorder   . Complicated pregnancy     1st pregnancy complicated by post operative hemorrhage and 2ng complicated by epidural  . Diabetes mellitus   . Diverticulosis of colon (without mention of hemorrhage)   . Guttate psoriasis     Past Surgical History  Procedure Laterality Date  . Hernia repair    . Varicose vein  surgery  1974    right leg   . Tonsillectomy  1953  . Septoplasty  1969  . Abdominal hysterectomy    . Bladder repair  1991    lift   . Rectocele repair    . Cystocele repair    . Partial colectomy  Nov 2012    left, secondary to diverticular perf  . Appendectomy  Dec 2012       The following portions of the patient's history were reviewed and updated as appropriate: Allergies, current medications, and problem list.    Review of Systems:   Patient denies headache, fevers, malaise, unintentional weight loss, skin rash, eye pain, sinus congestion and sinus pain, sore throat, dysphagia,  hemoptysis , cough, dyspnea, wheezing, chest pain, palpitations, orthopnea, edema, abdominal pain, nausea, melena, diarrhea, constipation, flank pain, dysuria, hematuria, urinary  Frequency, nocturia, numbness, tingling, seizures,  Focal weakness, Loss of consciousness,  Tremor, insomnia, depression, anxiety, and suicidal ideation.     History   Social History  . Marital Status: Married    Spouse Name: N/A    Number of Children: N/A  . Years of Education: N/A   Occupational History  . Not on file.   Social History Main Topics  . Smoking status: Never Smoker   . Smokeless tobacco: Never Used  . Alcohol Use: 3.0 oz/week    5 Glasses of  wine per week     Comment: Occasional  . Drug Use: No  . Sexual Activity: Yes    Birth Control/ Protection: Post-menopausal   Other Topics Concern  . Not on file   Social History Narrative    Objective:  Filed Vitals:   01/27/14 0914  BP: 142/60  Pulse: 72  Temp: 97.2 F (36.2 C)  Resp: 14     General appearance: alert, cooperative and appears stated age Ears: normal TM's and external ear canals both ears Throat: lips, mucosa, and tongue normal; teeth and gums normal Neck: no adenopathy, no carotid bruit, supple, symmetrical, trachea midline and thyroid not enlarged, symmetric, no tenderness/mass/nodules Back: symmetric, no curvature. ROM  normal. No CVA tenderness. Lungs: clear to auscultation bilaterally Heart: regular rate and rhythm, S1, S2 normal, no murmur, click, rub or gallop Abdomen: soft, non-tender; bowel sounds normal; no masses,  no organomegaly Pulses: 2+ and symmetric Skin: Skin color, texture, turgor normal. No rashes or lesions Lymph nodes: Cervical, supraclavicular, and axillary nodes normal.  Assessment and Plan:  Problem List Items Addressed This Visit    Basal cell carcinoma of left side of nose    She has stopped the Singulair and has no recurrence.       Diabetes mellitus, type II - Primary     diabetes is still under good control on current regimen, but your a1c has risen slightly compared to last time as is cholesterol No medication changes are needed at this time, but advised to to limit the number of complex carbohydrates (starches, including white potatoes, Cereals, Cookies and cake) in  diet to as few as possible and make sure you are getting some exercise regularly. return in 3 months   Lab Results  Component Value Date   HGBA1C 6.9* 01/27/2014   Lab Results  Component Value Date   CHOL 195 01/27/2014   HDL 59.80 01/27/2014   LDLCALC 101* 01/27/2014   LDLDIRECT 119.7 07/18/2011   TRIG 173.0* 01/27/2014   CHOLHDL 3 01/27/2014   Lab Results  Component Value Date   MICROALBUR 0.2 05/08/2013         Hyperlipidemia    LDL and triglycerides are near  goal on current medications.  She has no side effects and liver enzymes are normal. No changes today.  Lab Results  Component Value Date   CHOL 195 01/27/2014   HDL 59.80 01/27/2014   LDLCALC 101* 01/27/2014   LDLDIRECT 119.7 07/18/2011   TRIG 173.0* 01/27/2014   CHOLHDL 3 01/27/2014   Lab Results  Component Value Date   ALT 33 01/27/2014   AST 27 01/27/2014   ALKPHOS 78 01/27/2014   BILITOT 0.6 01/27/2014               Hypertension    Well controlled on current regimen. Renal function stable, no changes  today.  Lab Results  Component Value Date   CREATININE 0.67 01/27/2014   Lab Results  Component Value Date   NA 138 01/27/2014   K 4.5 01/27/2014   CL 101 01/27/2014   CO2 30 01/27/2014   Lab Results  Component Value Date   MICROALBUR 0.2 05/08/2013         Long-term use of high-risk medication    Taking MTX for guttate psoriasis. CBC, hepatic enzymes and renal function are normal.  Lab Results  Component Value Date   WBC 5.5 01/27/2014   HGB 14.1 01/27/2014   HCT 40.9 01/27/2014   MCV 94.5 01/27/2014  PLT 242.0 01/27/2014   Lab Results  Component Value Date   CREATININE 0.67 01/27/2014   Lab Results  Component Value Date   ALT 33 01/27/2014   AST 27 01/27/2014   ALKPHOS 78 01/27/2014   BILITOT 0.6 01/27/2014

## 2014-01-27 NOTE — Patient Instructions (Addendum)
YOu are doing well  I will e mail you your lab results.  See you in 4 months unless the A1c is > 7.0  The patches are to prevent sea sickness and will last for 72 hours per patch,

## 2014-01-27 NOTE — Progress Notes (Signed)
Pre-visit discussion using our clinic review tool. No additional management support is needed unless otherwise documented below in the visit note.  

## 2014-01-28 ENCOUNTER — Encounter: Payer: Self-pay | Admitting: Internal Medicine

## 2014-01-28 NOTE — Assessment & Plan Note (Signed)
Well controlled on current regimen. Renal function stable, no changes today.  Lab Results  Component Value Date   CREATININE 0.67 01/27/2014   Lab Results  Component Value Date   NA 138 01/27/2014   K 4.5 01/27/2014   CL 101 01/27/2014   CO2 30 01/27/2014   Lab Results  Component Value Date   MICROALBUR 0.2 05/08/2013

## 2014-01-28 NOTE — Assessment & Plan Note (Addendum)
diabetes is still under good control on current regimen, but your a1c has risen slightly compared to last time as is cholesterol No medication changes are needed at this time, but advised to to limit the number of complex carbohydrates (starches, including white potatoes, Cereals, Cookies and cake) in  diet to as few as possible and make sure you are getting some exercise regularly. return in 3 months   Lab Results  Component Value Date   HGBA1C 6.9* 01/27/2014   Lab Results  Component Value Date   CHOL 195 01/27/2014   HDL 59.80 01/27/2014   LDLCALC 101* 01/27/2014   LDLDIRECT 119.7 07/18/2011   TRIG 173.0* 01/27/2014   CHOLHDL 3 01/27/2014   Lab Results  Component Value Date   MICROALBUR 0.2 05/08/2013

## 2014-01-28 NOTE — Assessment & Plan Note (Addendum)
Taking MTX for guttate psoriasis. CBC, hepatic enzymes and renal function are normal.  Lab Results  Component Value Date   WBC 5.5 01/27/2014   HGB 14.1 01/27/2014   HCT 40.9 01/27/2014   MCV 94.5 01/27/2014   PLT 242.0 01/27/2014   Lab Results  Component Value Date   CREATININE 0.67 01/27/2014   Lab Results  Component Value Date   ALT 33 01/27/2014   AST 27 01/27/2014   ALKPHOS 78 01/27/2014   BILITOT 0.6 01/27/2014

## 2014-01-28 NOTE — Assessment & Plan Note (Signed)
LDL and triglycerides are near  goal on current medications.  She has no side effects and liver enzymes are normal. No changes today.  Lab Results  Component Value Date   CHOL 195 01/27/2014   HDL 59.80 01/27/2014   LDLCALC 101* 01/27/2014   LDLDIRECT 119.7 07/18/2011   TRIG 173.0* 01/27/2014   CHOLHDL 3 01/27/2014   Lab Results  Component Value Date   ALT 33 01/27/2014   AST 27 01/27/2014   ALKPHOS 78 01/27/2014   BILITOT 0.6 01/27/2014

## 2014-01-28 NOTE — Assessment & Plan Note (Signed)
She has stopped the Singulair and has no recurrence.

## 2014-01-30 ENCOUNTER — Encounter: Payer: Self-pay | Admitting: Internal Medicine

## 2014-01-31 ENCOUNTER — Other Ambulatory Visit: Payer: Self-pay | Admitting: Internal Medicine

## 2014-01-31 ENCOUNTER — Telehealth: Payer: Self-pay

## 2014-01-31 MED ORDER — SCOPOLAMINE 1 MG/3DAYS TD PT72: 1.0000 | MEDICATED_PATCH | TRANSDERMAL | Status: DC

## 2014-01-31 NOTE — Telephone Encounter (Signed)
Received PA request from [facility]. PA started on cover my meds. Awaiting response at this time

## 2014-02-03 DIAGNOSIS — Z7689 Persons encountering health services in other specified circumstances: Secondary | ICD-10-CM

## 2014-02-04 ENCOUNTER — Telehealth: Payer: Self-pay | Admitting: Internal Medicine

## 2014-02-04 NOTE — Telephone Encounter (Signed)
PA for scopalomine patches (for sea sickness) has been completed and in red folder. I checked the box for  Home delivery iwhich may help get it passed. The charge for my time in completing it is $29

## 2014-02-04 NOTE — Telephone Encounter (Signed)
PA faxed to insurance. Copy given for billing.

## 2014-03-03 ENCOUNTER — Ambulatory Visit: Payer: Self-pay | Admitting: Internal Medicine

## 2014-03-03 ENCOUNTER — Encounter: Payer: Self-pay | Admitting: Internal Medicine

## 2014-03-03 DIAGNOSIS — R928 Other abnormal and inconclusive findings on diagnostic imaging of breast: Secondary | ICD-10-CM | POA: Diagnosis not present

## 2014-03-03 LAB — HM MAMMOGRAPHY: HM MAMMO: NEGATIVE

## 2014-03-19 DIAGNOSIS — Z5181 Encounter for therapeutic drug level monitoring: Secondary | ICD-10-CM | POA: Diagnosis not present

## 2014-03-19 DIAGNOSIS — L570 Actinic keratosis: Secondary | ICD-10-CM | POA: Diagnosis not present

## 2014-03-19 DIAGNOSIS — D2261 Melanocytic nevi of right upper limb, including shoulder: Secondary | ICD-10-CM | POA: Diagnosis not present

## 2014-03-19 DIAGNOSIS — X32XXXA Exposure to sunlight, initial encounter: Secondary | ICD-10-CM | POA: Diagnosis not present

## 2014-03-19 DIAGNOSIS — D225 Melanocytic nevi of trunk: Secondary | ICD-10-CM | POA: Diagnosis not present

## 2014-03-19 DIAGNOSIS — L409 Psoriasis, unspecified: Secondary | ICD-10-CM | POA: Diagnosis not present

## 2014-03-19 DIAGNOSIS — Z85828 Personal history of other malignant neoplasm of skin: Secondary | ICD-10-CM | POA: Diagnosis not present

## 2014-03-19 DIAGNOSIS — L400 Psoriasis vulgaris: Secondary | ICD-10-CM | POA: Diagnosis not present

## 2014-05-26 ENCOUNTER — Other Ambulatory Visit (INDEPENDENT_AMBULATORY_CARE_PROVIDER_SITE_OTHER): Payer: Medicare Other

## 2014-05-26 ENCOUNTER — Telehealth: Payer: Self-pay | Admitting: *Deleted

## 2014-05-26 DIAGNOSIS — E119 Type 2 diabetes mellitus without complications: Secondary | ICD-10-CM

## 2014-05-26 LAB — COMPREHENSIVE METABOLIC PANEL
ALBUMIN: 4.3 g/dL (ref 3.5–5.2)
ALK PHOS: 81 U/L (ref 39–117)
ALT: 20 U/L (ref 0–35)
AST: 23 U/L (ref 0–37)
BUN: 17 mg/dL (ref 6–23)
CO2: 31 meq/L (ref 19–32)
Calcium: 9.4 mg/dL (ref 8.4–10.5)
Chloride: 99 mEq/L (ref 96–112)
Creatinine, Ser: 0.68 mg/dL (ref 0.40–1.20)
GFR: 91.03 mL/min (ref >60.00)
Glucose, Bld: 130 mg/dL — ABNORMAL HIGH (ref 70–99)
Potassium: 4.6 mEq/L (ref 3.5–5.1)
SODIUM: 136 meq/L (ref 135–145)
TOTAL PROTEIN: 6.8 g/dL (ref 6.0–8.3)
Total Bilirubin: 0.6 mg/dL (ref 0.2–1.2)

## 2014-05-26 LAB — LIPID PANEL
CHOL/HDL RATIO: 3
CHOLESTEROL: 195 mg/dL (ref 0–200)
HDL: 60 mg/dL (ref >39.00)
LDL Cholesterol: 104 mg/dL — ABNORMAL HIGH (ref 0–99)
NonHDL: 135
Triglycerides: 154 mg/dL — ABNORMAL HIGH (ref 0.0–149.0)
VLDL: 30.8 mg/dL (ref 0.0–40.0)

## 2014-05-26 LAB — HEMOGLOBIN A1C: Hgb A1c MFr Bld: 6.6 % — ABNORMAL HIGH (ref 4.6–6.5)

## 2014-05-26 NOTE — Telephone Encounter (Signed)
Labs and dx?  

## 2014-05-28 ENCOUNTER — Ambulatory Visit (INDEPENDENT_AMBULATORY_CARE_PROVIDER_SITE_OTHER): Payer: Medicare Other | Admitting: Internal Medicine

## 2014-05-28 ENCOUNTER — Encounter: Payer: Self-pay | Admitting: Internal Medicine

## 2014-05-28 VITALS — BP 124/74 | HR 79 | Temp 98.1°F | Resp 16 | Ht 59.0 in | Wt 130.5 lb

## 2014-05-28 DIAGNOSIS — E785 Hyperlipidemia, unspecified: Secondary | ICD-10-CM | POA: Diagnosis not present

## 2014-05-28 DIAGNOSIS — E119 Type 2 diabetes mellitus without complications: Secondary | ICD-10-CM | POA: Diagnosis not present

## 2014-05-28 DIAGNOSIS — I10 Essential (primary) hypertension: Secondary | ICD-10-CM

## 2014-05-28 MED ORDER — GLUCOSE BLOOD VI STRP: ORAL_STRIP | Status: DC

## 2014-05-28 NOTE — Progress Notes (Signed)
Subjective:  Patient ID: Emily Ryan, female    DOB: 1945/03/06  Age: 69 y.o. MRN: [id number]  CC: There were no encounter diagnoses.  HPI Emily Ryan presents for  DM follow up. She  feels generally well, is exercising several times per week and checking blood sugars once daily at variable times.  BS have been under 120 fasting and < 150 post prandially.  Denies any recent hypoglyemic events.  Taking her medications as directed. Following a carbohydrate modified diet 7 days per week. Denies numbness, burning and tingling of extremities. Appetite is good.  Losing weight intentnionally by working out and reducing porrtion size.        Outpatient Prescriptions Prior to Visit  Medication Sig Dispense Refill  . Calcium Carb-Cholecalciferol 600-100 MG-UNIT CAPS Take 1 tablet by mouth daily. Two by mouth daily 90 capsule 3  . escitalopram (LEXAPRO) 5 MG tablet Take 1 tablet (5 mg total) by mouth daily. 90 tablet 2  . esomeprazole (NEXIUM) 20 MG capsule Take 1 capsule (20 mg total) by mouth daily before breakfast. 90 capsule 3  . folic acid (FOLVITE) 1 MG tablet Take 1 mg by mouth daily.    . hydrochlorothiazide (HYDRODIURIL) 12.5 MG tablet Take 1 tablet (12.5 mg total) by mouth daily. 90 tablet 3  . lisinopril (PRINIVIL,ZESTRIL) 20 MG tablet Take 1 tablet (20 mg total) by mouth daily. 90 tablet 3  . loratadine (CLARITIN) 10 MG tablet Take 1 tablet (10 mg total) by mouth daily. 90 tablet 3  . metFORMIN (GLUCOPHAGE XR) 750 MG 24 hr tablet Take 1 tablet (750 mg total) by mouth daily with breakfast. 90 tablet 3  . methotrexate (RHEUMATREX) 2.5 MG tablet Take 15 mg by mouth once a week.    . montelukast (SINGULAIR) 10 MG tablet Take 1 tablet (10 mg total) by mouth at bedtime. 90 tablet 3  . Multiple Vitamin (MULTIVITAMIN) capsule Take 1 capsule by mouth daily.      . Omega-3 Fatty Acids (FISH OIL) 1000 MG CAPS Take 1 capsule (1,000 mg total) by mouth daily. 90 capsule 3  . penciclovir  (DENAVIR) 1 % cream Apply 1 application topically every 2 (two) hours. 1.5 g 0  . Precision Thin Lancets MISC Use as directed to check sugars twice daily 180 each 3  . scopolamine (TRANSDERM-SCOP) 1 MG/3DAYS Place 1 patch (1.5 mg total) onto the skin every 3 (three) days. 4 patch 0  . simvastatin (ZOCOR) 40 MG tablet Take 1 tablet (40 mg total) by mouth daily. 90 tablet 3  . vitamin E 400 UNIT capsule Take 400 Units by mouth daily.      . Blood Glucose Monitoring Suppl (PRECISION XTRA) KIT For use two times daily (Patient not taking: Reported on 05/28/2014) 1 each 1  . glucose blood (PRECISION XTRA TEST STRIPS) test strip Use as instructed to test sugars twice daily (Patient not taking: Reported on 05/28/2014) 180 each 3   No facility-administered medications prior to visit.    Review of Systems;  Patient denies headache, fevers, malaise, unintentional weight loss, skin rash, eye pain, sinus congestion and sinus pain, sore throat, dysphagia,  hemoptysis , cough, dyspnea, wheezing, chest pain, palpitations, orthopnea, edema, abdominal pain, nausea, melena, diarrhea, constipation, flank pain, dysuria, hematuria, urinary  Frequency, nocturia, numbness, tingling, seizures,  Focal weakness, Loss of consciousness,  Tremor, insomnia, depression, anxiety, and suicidal ideation.      Objective:  BP 124/74 mmHg  Pulse 79  Temp(Src) 98.1 F (36.7  C) (Oral)  Resp 16  Ht _0  (1.499 m)  Wt 130 lb 8 oz (59.194 kg)  BMI 26.34 kg/m2  SpO2 97%  BP Readings from Last 3 Encounters:  05/28/14 124/74  01/27/14 142/60  09/26/13 120/60    Wt Readings from Last 3 Encounters:  05/28/14 130 lb 8 oz (59.194 kg)  01/27/14 135 lb 8 oz (61.462 kg)  09/26/13 130 lb 8 oz (59.194 kg)    General appearance: alert, cooperative and appears stated age Ears: normal TM's and external ear canals both ears Throat: lips, mucosa, and tongue normal; teeth and gums normal Neck: no adenopathy, no carotid bruit, supple,  symmetrical, trachea midline and thyroid not enlarged, symmetric, no tenderness/mass/nodules Back: symmetric, no curvature. ROM normal. No CVA tenderness. Lungs: clear to auscultation bilaterally Heart: regular rate and rhythm, S1, S2 normal, no murmur, click, rub or gallop Abdomen: soft, non-tender; bowel sounds normal; no masses,  no organomegaly Pulses: 2+ and symmetric Skin: Skin color, texture, turgor normal. No rashes or lesions Lymph nodes: Cervical, supraclavicular, and axillary nodes normal.  Lab Results  Component Value Date   HGBA1C 6.6* 05/26/2014   HGBA1C 6.9* 01/27/2014   HGBA1C 6.4 09/24/2013    Lab Results  Component Value Date   CREATININE 0.68 05/26/2014   CREATININE 0.67 01/27/2014   CREATININE 0.7 09/24/2013    Lab Results  Component Value Date   WBC 5.5 01/27/2014   HGB 14.1 01/27/2014   HCT 40.9 01/27/2014   PLT 242.0 01/27/2014   GLUCOSE 130* 05/26/2014   CHOL 195 05/26/2014   TRIG 154.0* 05/26/2014   HDL 60.00 05/26/2014   LDLDIRECT 119.7 07/18/2011   LDLCALC 104* 05/26/2014   ALT 20 05/26/2014   AST 23 05/26/2014   NA 136 05/26/2014   K 4.6 05/26/2014   CL 99 05/26/2014   CREATININE 0.68 05/26/2014   BUN 17 05/26/2014   CO2 31 05/26/2014   TSH 1.04 09/08/2010   HGBA1C 6.6* 05/26/2014   MICROALBUR 0.2 05/08/2013    No results found.  Assessment & Plan:   Problem List Items Addressed This Visit    None      I have discontinued Ms. Millan's glucose blood. I am also having her maintain her vitamin E, multivitamin, methotrexate, PRECISION XTRA, PRECISION THIN LANCETS, folic acid, Fish Oil, Calcium Carb-Cholecalciferol, montelukast, esomeprazole, hydrochlorothiazide, lisinopril, loratadine, metFORMIN, simvastatin, escitalopram, penciclovir, and scopolamine.  No orders of the defined types were placed in this encounter.    Medications Discontinued During This Encounter  Medication Reason  . glucose blood (PRECISION XTRA TEST STRIPS)  test strip     Follow-up: No Follow-up on file.   Crecencio Mc, MD

## 2014-05-28 NOTE — Progress Notes (Signed)
Pre-visit discussion using our clinic review tool. No additional management support is needed unless otherwise documented below in the visit note.  

## 2014-05-28 NOTE — Patient Instructions (Signed)
Your diabetes remains under excellent control  And your cholesterol and other labs are also normal. Please continue your current medications. return in 6 months for follow up on diabetes and make sure you see Dr Thomasene Ripple this June   .

## 2014-05-30 ENCOUNTER — Encounter: Payer: Self-pay | Admitting: Internal Medicine

## 2014-05-31 NOTE — Assessment & Plan Note (Addendum)
well-controlled on current medications.  hemoglobin A1c has been consistently at or  less than 7.0 . Patient is up-to-date on eye exams and foot exam is normal today. Patient is due for urine microalbumin to creatinine ratio at next visit  but is taking lisinopril. Patient is tolerating  on statin therapy for CAD risk reduction and on ACE/ARB for reduction in proteinuria.    Lab Results  Component Value Date   HGBA1C 6.6* 05/26/2014   Lab Results  Component Value Date   MICROALBUR 0.2 05/08/2013   Lab Results  Component Value Date   CHOL 195 05/26/2014   HDL 60.00 05/26/2014   LDLCALC 104* 05/26/2014   LDLDIRECT 119.7 07/18/2011   TRIG 154.0* 05/26/2014   CHOLHDL 3 05/26/2014

## 2014-05-31 NOTE — Assessment & Plan Note (Signed)
LDL and triglycerides are near  goal on  simvastatin 40 mg daily.  She has no side effects and liver enzymes are normal. No changes today.  Lab Results  Component Value Date   CHOL 195 05/26/2014   HDL 60.00 05/26/2014   LDLCALC 104* 05/26/2014   LDLDIRECT 119.7 07/18/2011   TRIG 154.0* 05/26/2014   CHOLHDL 3 05/26/2014   Lab Results  Component Value Date   ALT 20 05/26/2014   AST 23 05/26/2014   ALKPHOS 81 05/26/2014   BILITOT 0.6 05/26/2014

## 2014-05-31 NOTE — Assessment & Plan Note (Signed)
Well controlled on current regimen. Renal function stable, no changes today.  Lab Results  Component Value Date   CREATININE 0.68 05/26/2014   Lab Results  Component Value Date   NA 136 05/26/2014   K 4.6 05/26/2014   CL 99 05/26/2014   CO2 31 05/26/2014

## 2014-06-05 ENCOUNTER — Encounter: Payer: Self-pay | Admitting: Internal Medicine

## 2014-06-09 ENCOUNTER — Other Ambulatory Visit: Payer: Self-pay | Admitting: Internal Medicine

## 2014-06-10 ENCOUNTER — Other Ambulatory Visit: Payer: Self-pay | Admitting: Internal Medicine

## 2014-06-10 DIAGNOSIS — Z8262 Family history of osteoporosis: Secondary | ICD-10-CM

## 2014-06-18 DIAGNOSIS — I788 Other diseases of capillaries: Secondary | ICD-10-CM | POA: Diagnosis not present

## 2014-06-18 DIAGNOSIS — L821 Other seborrheic keratosis: Secondary | ICD-10-CM | POA: Diagnosis not present

## 2014-06-18 DIAGNOSIS — L400 Psoriasis vulgaris: Secondary | ICD-10-CM | POA: Diagnosis not present

## 2014-06-18 DIAGNOSIS — D1801 Hemangioma of skin and subcutaneous tissue: Secondary | ICD-10-CM | POA: Diagnosis not present

## 2014-07-09 DIAGNOSIS — E139 Other specified diabetes mellitus without complications: Secondary | ICD-10-CM | POA: Diagnosis not present

## 2014-07-09 LAB — HM DIABETES EYE EXAM

## 2014-07-10 ENCOUNTER — Encounter: Payer: Self-pay | Admitting: *Deleted

## 2014-09-10 ENCOUNTER — Encounter: Payer: Self-pay | Admitting: Internal Medicine

## 2014-09-10 MED ORDER — MONTELUKAST SODIUM 10 MG PO TABS: 10.0000 mg | ORAL_TABLET | Freq: Every day | ORAL | Status: DC

## 2014-09-10 MED ORDER — ESCITALOPRAM OXALATE 5 MG PO TABS: 5.0000 mg | ORAL_TABLET | Freq: Every day | ORAL | Status: DC

## 2014-09-10 NOTE — Telephone Encounter (Signed)
Please advise can refill lexapro?

## 2014-09-10 NOTE — Telephone Encounter (Signed)
Yes,  Printed both rxs  [facility]

## 2014-09-11 NOTE — Telephone Encounter (Signed)
Scripts placed at front desk for pick up and patient notified.

## 2014-09-26 ENCOUNTER — Other Ambulatory Visit: Payer: Medicare Other

## 2014-09-29 ENCOUNTER — Other Ambulatory Visit (INDEPENDENT_AMBULATORY_CARE_PROVIDER_SITE_OTHER): Payer: Medicare Other

## 2014-09-29 ENCOUNTER — Telehealth: Payer: Self-pay | Admitting: *Deleted

## 2014-09-29 DIAGNOSIS — E785 Hyperlipidemia, unspecified: Secondary | ICD-10-CM | POA: Diagnosis not present

## 2014-09-29 DIAGNOSIS — E119 Type 2 diabetes mellitus without complications: Secondary | ICD-10-CM | POA: Diagnosis not present

## 2014-09-29 NOTE — Telephone Encounter (Signed)
Labs and dx?  

## 2014-09-30 LAB — COMPREHENSIVE METABOLIC PANEL
ALK PHOS: 76 U/L (ref 39–117)
ALT: 26 U/L (ref 0–35)
AST: 22 U/L (ref 0–37)
Albumin: 4.2 g/dL (ref 3.5–5.2)
BILIRUBIN TOTAL: 0.5 mg/dL (ref 0.2–1.2)
BUN: 15 mg/dL (ref 6–23)
CO2: 29 meq/L (ref 19–32)
Calcium: 9.2 mg/dL (ref 8.4–10.5)
Chloride: 101 mEq/L (ref 96–112)
Creatinine, Ser: 0.69 mg/dL (ref 0.40–1.20)
GFR: 89.42 mL/min (ref >60.00)
GLUCOSE: 145 mg/dL — AB (ref 70–99)
Potassium: 4.4 mEq/L (ref 3.5–5.1)
SODIUM: 140 meq/L (ref 135–145)
TOTAL PROTEIN: 6.6 g/dL (ref 6.0–8.3)

## 2014-09-30 LAB — LDL CHOLESTEROL, DIRECT: Direct LDL: 129 mg/dL

## 2014-09-30 LAB — LIPID PANEL
CHOL/HDL RATIO: 3
CHOLESTEROL: 196 mg/dL (ref 0–200)
HDL: 59.9 mg/dL (ref >39.00)
LDL CALC: 106 mg/dL — AB (ref 0–99)
NonHDL: 135.77
Triglycerides: 150 mg/dL — ABNORMAL HIGH (ref 0.0–149.0)
VLDL: 30 mg/dL (ref 0.0–40.0)

## 2014-09-30 LAB — HEMOGLOBIN A1C: Hgb A1c MFr Bld: 6.8 % — ABNORMAL HIGH (ref 4.6–6.5)

## 2014-10-01 ENCOUNTER — Encounter: Payer: Self-pay | Admitting: Internal Medicine

## 2014-10-01 ENCOUNTER — Ambulatory Visit (INDEPENDENT_AMBULATORY_CARE_PROVIDER_SITE_OTHER): Payer: Medicare Other | Admitting: Internal Medicine

## 2014-10-01 VITALS — BP 118/60 | HR 73 | Temp 98.4°F | Resp 12 | Ht 59.0 in | Wt 135.1 lb

## 2014-10-01 DIAGNOSIS — Z23 Encounter for immunization: Secondary | ICD-10-CM | POA: Diagnosis not present

## 2014-10-01 DIAGNOSIS — IMO0001 Reserved for inherently not codable concepts without codable children: Secondary | ICD-10-CM

## 2014-10-01 DIAGNOSIS — E785 Hyperlipidemia, unspecified: Secondary | ICD-10-CM

## 2014-10-01 DIAGNOSIS — I10 Essential (primary) hypertension: Secondary | ICD-10-CM

## 2014-10-01 DIAGNOSIS — Z13820 Encounter for screening for osteoporosis: Secondary | ICD-10-CM

## 2014-10-01 DIAGNOSIS — E119 Type 2 diabetes mellitus without complications: Secondary | ICD-10-CM

## 2014-10-01 DIAGNOSIS — K219 Gastro-esophageal reflux disease without esophagitis: Secondary | ICD-10-CM | POA: Diagnosis not present

## 2014-10-01 LAB — MICROALBUMIN / CREATININE URINE RATIO
Creatinine,U: 23.1 mg/dL
Microalb Creat Ratio: 3 mg/g (ref 0.0–30.0)
Microalb, Ur: <0.7 mg/dL (ref 0.0–1.9)

## 2014-10-01 MED ORDER — HYDROCHLOROTHIAZIDE 12.5 MG PO TABS
12.5000 mg | ORAL_TABLET | Freq: Every day | ORAL | Status: DC
Start: 2014-10-01 — End: 2015-12-10

## 2014-10-01 MED ORDER — LORATADINE 10 MG PO TABS
10.0000 mg | ORAL_TABLET | Freq: Every day | ORAL | Status: DC
Start: 2014-10-01 — End: 2015-08-26

## 2014-10-01 MED ORDER — SIMVASTATIN 40 MG PO TABS
40.0000 mg | ORAL_TABLET | Freq: Every day | ORAL | Status: DC
Start: 2014-10-01 — End: 2015-12-10

## 2014-10-01 MED ORDER — METFORMIN HCL ER 750 MG PO TB24: 750.0000 mg | ORAL_TABLET | Freq: Every day | ORAL | Status: DC

## 2014-10-01 MED ORDER — ESOMEPRAZOLE MAGNESIUM 20 MG PO CPDR: 20.0000 mg | DELAYED_RELEASE_CAPSULE | Freq: Every day | ORAL | Status: DC

## 2014-10-01 MED ORDER — LISINOPRIL 20 MG PO TABS
20.0000 mg | ORAL_TABLET | Freq: Every day | ORAL | Status: DC
Start: 2014-10-01 — End: 2015-08-26

## 2014-10-01 NOTE — Assessment & Plan Note (Signed)
LDL and triglycerides are near  goal on  simvastatin 40 mg daily.  She has no side effects and liver enzymes are normal. No changes today.  Lab Results  Component Value Date   CHOL 196 09/29/2014   HDL 59.90 09/29/2014   LDLCALC 106* 09/29/2014   LDLDIRECT 129.0 09/29/2014   TRIG 150.0* 09/29/2014   CHOLHDL 3 09/29/2014   Lab Results  Component Value Date   ALT 26 09/29/2014   AST 22 09/29/2014   ALKPHOS 76 09/29/2014   BILITOT 0.5 09/29/2014

## 2014-10-01 NOTE — Assessment & Plan Note (Signed)
Well-controlled on current medications. Renal function is normal. Lab Results  Component Value Date   CREATININE 0.69 09/29/2014

## 2014-10-01 NOTE — Assessment & Plan Note (Addendum)
Her diabetes remains well controlled on current medications, although she has seen a gradual increase in her A1c over the last 9 months. Reviewed her diet and her exercise routine. She is exercising regularly. She is eating a lot of pasta. There are no changes to her medications today. She is up-to-date on annual eye exams. Foot exam was normal today. She has had her urine microalbumin to creatinine ratio ordered several times but not done. She will return for this today as she has already voided today.

## 2014-10-01 NOTE — Patient Instructions (Signed)
The  diet I discussed with you today is the 10 day Green Smoothie Cleansing /Detox Diet by Linden Dolin . available on [employer] for around $10.  This is not a low carb or a weight loss diet,  It is fundamentally a "cleansing" low fat diet that eliminates sugar, gluten, caffeine, alcohol and dairy for 10 days .  What you add back after the initial ten days is entirely up to  you!  You can expect to lose 5 to 10 lbs depending on how strict you are.   I suggest drinking 2 smoothies daily and keeping one chewable meal (but keep it simple, like baked fish and salad, rice or bok choy) .  You snack primarily on fresh  fruit, egg whites and judicious quantities of nuts. You can add vegetable based protein powder (nothing with whey , since whey is dairy) in it.  [employer] has a [employer] .  It does require some form of a nutrient extractor (Vita Mix, a electric juicer,  Or a Nutribullet Rx).  i have found that using frozen fruits is much more convenient and cost effective. You can even find plenty of organic fruit in the frozen fruit section of [employer].  Just thaw what you need for the following day the night before in the refrigerator (to avoid jamming up your machine)   Try the Heritage Flakes cereal available at [facility] on the bottom shelf, with vanilla flavored almond milk  [employer] sells an organic almond mik, non refrigerated,  Called :"orgain"  That is fortified with protein and very low carb

## 2014-10-01 NOTE — Progress Notes (Signed)
Subjective:  Patient ID: Emily Ryan, female    DOB: 04-14-1945  Age: 69 y.o. MRN: [id number]  CC: The primary encounter diagnosis was Reflux. Diagnoses of Diabetes mellitus without complication, Encounter for immunization, Type 2 diabetes mellitus without complication, Essential hypertension, Hyperlipidemia, and Screening for osteoporosis were also pertinent to this visit.  HPI Emily Ryan presents for follow-up on diabetes, hypertension, and overweight. She's gained 5 pounds since her last visit. Her blood sugars remain well controlled. She has had multiple dietary indulgences over the last several weeks due to visiting relatives in [place]. She continues to work out on a daily basis. She denies having any recent hypoglycemic events. Her fasting glucoses have been under 1:30. She denies any  numbness tingling or burning sensation in her feet. He has no new complaints today.  Outpatient Prescriptions Prior to Visit  Medication Sig Dispense Refill  . Calcium Carb-Cholecalciferol 600-100 MG-UNIT CAPS Take 1 tablet by mouth daily. Two by mouth daily 90 capsule 3  . escitalopram (LEXAPRO) 5 MG tablet Take 1 tablet (5 mg total) by mouth daily. 90 tablet 2  . folic acid (FOLVITE) 1 MG tablet Take 1 mg by mouth daily.    Marland Kitchen glucose blood (FREESTYLE LITE) test strip Use as instructed to test blood sugar twice daily 200 each 3  . methotrexate (RHEUMATREX) 2.5 MG tablet Take 15 mg by mouth once a week.    . montelukast (SINGULAIR) 10 MG tablet Take 1 tablet (10 mg total) by mouth at bedtime. 90 tablet 3  . Multiple Vitamin (MULTIVITAMIN) capsule Take 1 capsule by mouth daily.      . Omega-3 Fatty Acids (FISH OIL) 1000 MG CAPS Take 1 capsule (1,000 mg total) by mouth daily. 90 capsule 3  . penciclovir (DENAVIR) 1 % cream Apply 1 application topically every 2 (two) hours. 1.5 g 0  . Precision Thin Lancets MISC Use as directed to check sugars twice daily 180 each 3  . vitamin E 400 UNIT capsule Take  400 Units by mouth daily.      . hydrochlorothiazide (HYDRODIURIL) 12.5 MG tablet Take 1 tablet (12.5 mg total) by mouth daily. 90 tablet 3  . lisinopril (PRINIVIL,ZESTRIL) 20 MG tablet Take 1 tablet (20 mg total) by mouth daily. 90 tablet 3  . loratadine (CLARITIN) 10 MG tablet Take 1 tablet (10 mg total) by mouth daily. 90 tablet 3  . metFORMIN (GLUCOPHAGE XR) 750 MG 24 hr tablet Take 1 tablet (750 mg total) by mouth daily with breakfast. 90 tablet 3  . simvastatin (ZOCOR) 40 MG tablet Take 1 tablet (40 mg total) by mouth daily. 90 tablet 3  . esomeprazole (NEXIUM) 20 MG capsule Take 1 capsule (20 mg total) by mouth daily before breakfast. 90 capsule 3  . scopolamine (TRANSDERM-SCOP) 1 MG/3DAYS Place 1 patch (1.5 mg total) onto the skin every 3 (three) days. (Patient not taking: Reported on 10/01/2014) 4 patch 0   No facility-administered medications prior to visit.    Review of Systems;  Patient denies headache, fevers, malaise, unintentional weight loss, skin rash, eye pain, sinus congestion and sinus pain, sore throat, dysphagia,  hemoptysis , cough, dyspnea, wheezing, chest pain, palpitations, orthopnea, edema, abdominal pain, nausea, melena, diarrhea, constipation, flank pain, dysuria, hematuria, urinary  Frequency, nocturia, numbness, tingling, seizures,  Focal weakness, Loss of consciousness,  Tremor, insomnia, depression, anxiety, and suicidal ideation.      Objective:  BP 118/60 mmHg  Pulse 73  Temp(Src) 98.4 F (36.9 C) (  Oral)  Resp 12  Ht 4\' 11"  (1.499 m)  Wt 135 lb 2 oz (61.292 kg)  BMI 27.28 kg/m2  SpO2 97%  BP Readings from Last 3 Encounters:  10/01/14 118/60  05/28/14 124/74  01/27/14 142/60    Wt Readings from Last 3 Encounters:  10/01/14 135 lb 2 oz (61.292 kg)  05/28/14 130 lb 8 oz (59.194 kg)  01/27/14 135 lb 8 oz (61.462 kg)    General appearance: alert, cooperative and appears stated age Ears: normal TM's and external ear canals both ears Throat: lips,  mucosa, and tongue normal; teeth and gums normal Neck: no adenopathy, no carotid bruit, supple, symmetrical, trachea midline and thyroid not enlarged, symmetric, no tenderness/mass/nodules Back: symmetric, no curvature. ROM normal. No CVA tenderness. Lungs: clear to auscultation bilaterally Heart: regular rate and rhythm, S1, S2 normal, no murmur, click, rub or gallop Abdomen: soft, non-tender; bowel sounds normal; no masses,  no organomegaly Pulses: 2+ and symmetric Skin: Skin color, texture, turgor normal. No rashes or lesions Lymph nodes: Cervical, supraclavicular, and axillary nodes normal.  Lab Results  Component Value Date   HGBA1C 6.8* 09/29/2014   HGBA1C 6.6* 05/26/2014   HGBA1C 6.9* 01/27/2014    Lab Results  Component Value Date   CREATININE 0.69 09/29/2014   CREATININE 0.68 05/26/2014   CREATININE 0.67 01/27/2014    Lab Results  Component Value Date   WBC 5.5 01/27/2014   HGB 14.1 01/27/2014   HCT 40.9 01/27/2014   PLT 242.0 01/27/2014   GLUCOSE 145* 09/29/2014   CHOL 196 09/29/2014   TRIG 150.0* 09/29/2014   HDL 59.90 09/29/2014   LDLDIRECT 129.0 09/29/2014   LDLCALC 106* 09/29/2014   ALT 26 09/29/2014   AST 22 09/29/2014   NA 140 09/29/2014   K 4.4 09/29/2014   CL 101 09/29/2014   CREATININE 0.69 09/29/2014   BUN 15 09/29/2014   CO2 29 09/29/2014   TSH 1.04 09/08/2010   HGBA1C 6.8* 09/29/2014   MICROALBUR <0.7 10/01/2014    No results found.  Assessment & Plan:   Problem List Items Addressed This Visit    Essential hypertension    Well-controlled on current medications. Renal function is normal. Lab Results  Component Value Date   CREATININE 0.69 09/29/2014         Relevant Medications   simvastatin (ZOCOR) 40 MG tablet   lisinopril (PRINIVIL,ZESTRIL) 20 MG tablet   hydrochlorothiazide (HYDRODIURIL) 12.5 MG tablet   Hyperlipidemia    LDL and triglycerides are near  goal on  simvastatin 40 mg daily.  She has no side effects and liver  enzymes are normal. No changes today.  Lab Results  Component Value Date   CHOL 196 09/29/2014   HDL 59.90 09/29/2014   LDLCALC 106* 09/29/2014   LDLDIRECT 129.0 09/29/2014   TRIG 150.0* 09/29/2014   CHOLHDL 3 09/29/2014   Lab Results  Component Value Date   ALT 26 09/29/2014   AST 22 09/29/2014   ALKPHOS 76 09/29/2014   BILITOT 0.5 09/29/2014                   Relevant Medications   simvastatin (ZOCOR) 40 MG tablet   lisinopril (PRINIVIL,ZESTRIL) 20 MG tablet   hydrochlorothiazide (HYDRODIURIL) 12.5 MG tablet   Diabetes mellitus, type II    Her diabetes remains well controlled on current medications, although she has seen a gradual increase in her A1c over the last 9 months. Reviewed her diet and her exercise routine. She is  exercising regularly. She is eating a lot of pasta. There are no changes to her medications today. She is up-to-date on annual eye exams. Foot exam was normal today. She has had her urine microalbumin to creatinine ratio ordered several times but not done. She will return for this today as she has already voided today.      Relevant Medications   simvastatin (ZOCOR) 40 MG tablet   metFORMIN (GLUCOPHAGE XR) 750 MG 24 hr tablet   lisinopril (PRINIVIL,ZESTRIL) 20 MG tablet    Other Visit Diagnoses    Reflux    -  Primary    Relevant Medications    esomeprazole (NEXIUM) 20 MG capsule    Diabetes mellitus without complication        Relevant Medications    simvastatin (ZOCOR) 40 MG tablet    metFORMIN (GLUCOPHAGE XR) 750 MG 24 hr tablet    lisinopril (PRINIVIL,ZESTRIL) 20 MG tablet    Other Relevant Orders    Urine Microalbumin w/creat. ratio (Completed)    Microalbumin / creatinine urine ratio    Encounter for immunization        Screening for osteoporosis        Relevant Orders    DG DXA FRACTURE ASSESSMENT       I have discontinued Ms. Batch's scopolamine. I am also having her maintain her vitamin E, multivitamin, methotrexate,  PRECISION THIN LANCETS, folic acid, Fish Oil, Calcium Carb-Cholecalciferol, penciclovir, glucose blood, montelukast, escitalopram, simvastatin, metFORMIN, lisinopril, loratadine, hydrochlorothiazide, and esomeprazole.  Meds ordered this encounter  Medications  . simvastatin (ZOCOR) 40 MG tablet    Sig: Take 1 tablet (40 mg total) by mouth daily.    Dispense:  90 tablet    Refill:  3  . metFORMIN (GLUCOPHAGE XR) 750 MG 24 hr tablet    Sig: Take 1 tablet (750 mg total) by mouth daily with breakfast.    Dispense:  90 tablet    Refill:  3  . lisinopril (PRINIVIL,ZESTRIL) 20 MG tablet    Sig: Take 1 tablet (20 mg total) by mouth daily.    Dispense:  90 tablet    Refill:  3  . loratadine (CLARITIN) 10 MG tablet    Sig: Take 1 tablet (10 mg total) by mouth daily.    Dispense:  90 tablet    Refill:  3  . hydrochlorothiazide (HYDRODIURIL) 12.5 MG tablet    Sig: Take 1 tablet (12.5 mg total) by mouth daily.    Dispense:  90 tablet    Refill:  3  . esomeprazole (NEXIUM) 20 MG capsule    Sig: Take 1 capsule (20 mg total) by mouth daily before breakfast.    Dispense:  90 capsule    Refill:  3    Medications Discontinued During This Encounter  Medication Reason  . scopolamine (TRANSDERM-SCOP) 1 MG/3DAYS Patient Preference  . simvastatin (ZOCOR) 40 MG tablet Reorder  . metFORMIN (GLUCOPHAGE XR) 750 MG 24 hr tablet Reorder  . lisinopril (PRINIVIL,ZESTRIL) 20 MG tablet Reorder  . loratadine (CLARITIN) 10 MG tablet Reorder  . hydrochlorothiazide (HYDRODIURIL) 12.5 MG tablet Reorder  . esomeprazole (NEXIUM) 20 MG capsule Reorder   A total of 25 minutes of face to face time was spent with patient more than half of which was spent in counselling on diet and diabetes management and coordination of care   Follow-up: Return in about 4 months (around 01/31/2015) for follow up diabetes.   Crecencio Mc, MD

## 2014-10-01 NOTE — Progress Notes (Signed)
Pre-visit discussion using our clinic review tool. No additional management support is needed unless otherwise documented below in the visit note.  

## 2014-10-02 ENCOUNTER — Encounter: Payer: Self-pay | Admitting: Internal Medicine

## 2014-10-02 DIAGNOSIS — L400 Psoriasis vulgaris: Secondary | ICD-10-CM | POA: Diagnosis not present

## 2014-10-02 DIAGNOSIS — D485 Neoplasm of uncertain behavior of skin: Secondary | ICD-10-CM | POA: Diagnosis not present

## 2014-10-02 DIAGNOSIS — D0439 Carcinoma in situ of skin of other parts of face: Secondary | ICD-10-CM | POA: Diagnosis not present

## 2014-10-02 DIAGNOSIS — Z85828 Personal history of other malignant neoplasm of skin: Secondary | ICD-10-CM | POA: Diagnosis not present

## 2014-10-02 DIAGNOSIS — L218 Other seborrheic dermatitis: Secondary | ICD-10-CM | POA: Diagnosis not present

## 2014-10-03 DIAGNOSIS — Z5181 Encounter for therapeutic drug level monitoring: Secondary | ICD-10-CM | POA: Diagnosis not present

## 2014-10-03 DIAGNOSIS — L400 Psoriasis vulgaris: Secondary | ICD-10-CM | POA: Diagnosis not present

## 2014-10-04 ENCOUNTER — Encounter: Payer: Self-pay | Admitting: Internal Medicine

## 2014-10-11 ENCOUNTER — Encounter: Payer: Self-pay | Admitting: Internal Medicine

## 2014-11-10 ENCOUNTER — Ambulatory Visit
Admission: RE | Admit: 2014-11-10 | Discharge: 2014-11-10 | Disposition: A | Discharge status: Home / Self Care | Payer: Medicare Other | Source: Ambulatory Visit | Attending: Internal Medicine | Admitting: Internal Medicine

## 2014-11-10 ENCOUNTER — Ambulatory Visit (INDEPENDENT_AMBULATORY_CARE_PROVIDER_SITE_OTHER): Payer: Medicare Other | Admitting: Internal Medicine

## 2014-11-10 ENCOUNTER — Encounter: Payer: Self-pay | Admitting: Internal Medicine

## 2014-11-10 VITALS — BP 116/60 | HR 81 | Ht 59.0 in | Wt 137.0 lb

## 2014-11-10 DIAGNOSIS — J45901 Unspecified asthma with (acute) exacerbation: Secondary | ICD-10-CM | POA: Diagnosis not present

## 2014-11-10 DIAGNOSIS — L409 Psoriasis, unspecified: Secondary | ICD-10-CM | POA: Diagnosis not present

## 2014-11-10 DIAGNOSIS — R05 Cough: Secondary | ICD-10-CM | POA: Diagnosis not present

## 2014-11-10 MED ORDER — UMECLIDINIUM-VILANTEROL 62.5-25 MCG/INH IN AEPB: 1.0000 | INHALATION_SPRAY | Freq: Every day | RESPIRATORY_TRACT | Status: AC

## 2014-11-10 MED ORDER — AZITHROMYCIN 250 MG PO TABS: ORAL_TABLET | ORAL | Status: DC

## 2014-11-10 MED ORDER — UMECLIDINIUM-VILANTEROL 62.5-25 MCG/INH IN AEPB: 1.0000 | INHALATION_SPRAY | Freq: Every day | RESPIRATORY_TRACT | Status: DC

## 2014-11-10 NOTE — Addendum Note (Signed)
Addended by: Oscar La R on: 11/10/2014 11:59 AM   Modules accepted: Orders

## 2014-11-10 NOTE — Addendum Note (Signed)
Addended by: Oscar La R on: 11/10/2014 01:10 PM   Modules accepted: Orders

## 2014-11-10 NOTE — Addendum Note (Signed)
Addended by: Maryanna Shape A on: 11/10/2014 11:54 AM   Modules accepted: Orders

## 2014-11-10 NOTE — Progress Notes (Signed)
* [facility]     Assessment and Plan:  Asthmatic bronchitis. -Patient has some wheezing today, consistent with asthmatic bronchitis. She is a history of psoriasis for which she is currently on methotrexate, and notes that prednisone tends to make her psoriasis worse. Therefore, we'll avoid this. She is given a course of azithromycin and a Anoro inhaler sample to be used for the next 7 days.  Allergic asthma. -Patient has some right upper lobe bronchial breath sounds today with loud breathing/stridorous breathing. Uncertain if this is her baseline, we'll check a chest x-ray today as well as IgE and CBC with differential. I have asked her to follow-up with me. Naproxen. He 4 months time and take a look at her when she is closer to her baseline.    Date: 11/10/2014  MRN# [id number] Emily Ryan July 29, 1945   Emily Ryan is a 69 y.o. old female seen in follow up for chief complaint of  Chief Complaint  Patient presents with  . Acute Visit    former BQ pt. pt. states she has prod. cough yellowish in color. occ. wheezing. chest pain/tightness with coughing x3d.     HPI:  Emily Ryan first saw the [facility] in 06/2012 for evaluation of annual bronchitis, allergic rhinitis and possible asthma. Simple spirometry was normal. She did well with step down therapy from Symbicort to Asmanex. She had a methacholine challenge which was normal in July 2014.  Today she notes that she has had some wheezing recently, and she has had a cough. Her husband states that she breathes loudly, and she appears to have this today.  She has gerd which is controlled on nexium.  She has 2 cats at home, she has been tested for allergies several years ago, she was allergic to cats and dogs. She has noted difficulty with the cats in the past, but this has improved over the years. They do not sleep in the bedroom. She has also had seasonal fall allergies which are improved  on claritin and singulair which she takes daily.   She went on a cruise this past week, and came back with another episode of bronchitis.   Review of testing: PFTS done at [facility] May 2014  FVC 2.44L 105% predFEV 1 1.96L (119% predicted) FEV1/FVC : 80 % pred DLCO 111% pred TLC 4.12L 102% RV 1.66L 112%   06/2012 ACQ (Asmanex) 0/7  07/2012 spiro PFT> ratio 82%, FEV 1 1.98L (105% pred), no change with BD 07/2012 methacholine challenge> no change with ay dose. Negative study  No flowsheet data found.  Pulmonary Functions Testing Results:  No results found for: FEV1, FVC, FEV1FVC, TLC, DLCO   Medication:   Outpatient Encounter Prescriptions as of 11/10/2014  Medication Sig  . Calcium Carb-Cholecalciferol 600-100 MG-UNIT CAPS Take 1 tablet by mouth daily. Two by mouth daily  . escitalopram (LEXAPRO) 5 MG tablet Take 1 tablet (5 mg total) by mouth daily.  Marland Kitchen esomeprazole (NEXIUM) 20 MG capsule Take 1 capsule (20 mg total) by mouth daily before breakfast.  . folic acid (FOLVITE) 1 MG tablet Take 1 mg by mouth daily.  Marland Kitchen glucose blood (FREESTYLE LITE) test strip Use as instructed to test blood sugar twice daily  . hydrochlorothiazide (HYDRODIURIL) 12.5 MG tablet Take 1 tablet (12.5 mg total) by mouth daily.  Marland Kitchen lisinopril (PRINIVIL,ZESTRIL) 20 MG tablet Take 1 tablet (20 mg total) by mouth daily.  Marland Kitchen loratadine (CLARITIN) 10 MG tablet Take 1 tablet (10 mg  total) by mouth daily.  . metFORMIN (GLUCOPHAGE XR) 750 MG 24 hr tablet Take 1 tablet (750 mg total) by mouth daily with breakfast.  . methotrexate (RHEUMATREX) 2.5 MG tablet Take 15 mg by mouth once a week.  . montelukast (SINGULAIR) 10 MG tablet Take 1 tablet (10 mg total) by mouth at bedtime.  . Multiple Vitamin (MULTIVITAMIN) capsule Take 1 capsule by mouth daily.    . Omega-3 Fatty Acids (FISH OIL) 1000 MG CAPS Take 1 capsule (1,000 mg total) by mouth daily.  Marland Kitchen penciclovir (DENAVIR) 1 % cream Apply 1 application topically every 2 (two)  hours.  . Precision Thin Lancets MISC Use as directed to check sugars twice daily  . simvastatin (ZOCOR) 40 MG tablet Take 1 tablet (40 mg total) by mouth daily.  . vitamin E 400 UNIT capsule Take 400 Units by mouth daily.     No facility-administered encounter medications on file as of 11/10/2014.     Allergies:  Review of patient's allergies indicates no known allergies.  Review of Systems: Gen:  Denies  fever, sweats. HEENT: Denies blurred vision. Cvc:  No dizziness, chest pain or heaviness Resp:   Denies cough or sputum porduction. Gi: Denies swallowing difficulty, stomach pain. constipation, bowel incontinence Gu:  Denies bladder incontinence, burning urine Ext:   No Joint pain, stiffness. Skin: No skin rash, easy bruising. Endoc:  No polyuria, polydipsia. Psych: No depression, insomnia. Other:  All other systems were reviewed and found to be negative other than what is mentioned in the HPI.   Physical Examination:   VS: BP 116/60 mmHg  Pulse 81  Ht 4\' 11"  (1.499 m)  Wt 137 lb (62.143 kg)  BMI 27.66 kg/m2  SpO2 95%  General Appearance: No distress  Neuro:without focal findings,  speech normal,  HEENT: PERRLA, EOM intact. Pulmonary: normal breath sounds, No wheezing.   CardiovascularNormal S1,S2.  No m/r/g.   Abdomen: Benign, Soft, non-tender. Renal:  No costovertebral tenderness  GU:  Not performed at this time. Endoc: No evident thyromegaly, no signs of acromegaly. Skin:   warm, no rash. Extremities: normal, no cyanosis, clubbing.   LABORATORY PANEL:   CBC No results for input(s): WBC, HGB, HCT, PLT in the last 168 hours. ------------------------------------------------------------------------------------------------------------------  Chemistries  No results for input(s): NA, K, CL, CO2, GLUCOSE, BUN, CREATININE, CALCIUM, MG, AST, ALT, ALKPHOS, BILITOT in the last 168 hours.  Invalid input(s):  GFRCGP ------------------------------------------------------------------------------------------------------------------  Cardiac Enzymes No results for input(s): TROPONINI in the last 168 hours. ------------------------------------------------------------  RADIOLOGY:   No results found for this or any previous visit. Results for orders placed in visit on 12/09/10  DG Chest 2 View   ------------------------------------------------------------------------------------------------------------------  Thank  you for allowing [facility] to assist in the care of your patient. Our recommendations are noted above.  Please contact us if we can be of further service.   Marda Stalker, MD.  [facility] Office Number: [phone]  Patricia Pesa, M.D.  Vilinda Boehringer, M.D.  Merton Border, M.D

## 2014-11-10 NOTE — Patient Instructions (Addendum)
--  CBC with differential.  --IgE level.  --CXR 2 view.   AFTER TESTS are performed may start azithromycin.  --Azithromycin, 500 mg first day, then 250 mg daily for 4 days.

## 2014-12-10 DIAGNOSIS — L578 Other skin changes due to chronic exposure to nonionizing radiation: Secondary | ICD-10-CM | POA: Diagnosis not present

## 2014-12-10 DIAGNOSIS — D099 Carcinoma in situ, unspecified: Secondary | ICD-10-CM | POA: Diagnosis not present

## 2014-12-10 DIAGNOSIS — Z85828 Personal history of other malignant neoplasm of skin: Secondary | ICD-10-CM | POA: Diagnosis not present

## 2014-12-26 DIAGNOSIS — L400 Psoriasis vulgaris: Secondary | ICD-10-CM | POA: Diagnosis not present

## 2014-12-26 DIAGNOSIS — D0439 Carcinoma in situ of skin of other parts of face: Secondary | ICD-10-CM | POA: Diagnosis not present

## 2015-02-04 ENCOUNTER — Telehealth: Payer: Self-pay | Admitting: *Deleted

## 2015-02-04 ENCOUNTER — Other Ambulatory Visit (INDEPENDENT_AMBULATORY_CARE_PROVIDER_SITE_OTHER): Payer: Medicare Other

## 2015-02-04 DIAGNOSIS — R5383 Other fatigue: Secondary | ICD-10-CM

## 2015-02-04 DIAGNOSIS — E785 Hyperlipidemia, unspecified: Secondary | ICD-10-CM | POA: Diagnosis not present

## 2015-02-04 DIAGNOSIS — Z79899 Other long term (current) drug therapy: Secondary | ICD-10-CM

## 2015-02-04 DIAGNOSIS — E119 Type 2 diabetes mellitus without complications: Secondary | ICD-10-CM

## 2015-02-04 LAB — COMPREHENSIVE METABOLIC PANEL
ALT: 29 U/L (ref 0–35)
AST: 26 U/L (ref 0–37)
Albumin: 4.1 g/dL (ref 3.5–5.2)
Alkaline Phosphatase: 83 U/L (ref 39–117)
BUN: 12 mg/dL (ref 6–23)
CALCIUM: 9.3 mg/dL (ref 8.4–10.5)
CHLORIDE: 101 meq/L (ref 96–112)
CO2: 30 meq/L (ref 19–32)
Creatinine, Ser: 0.68 mg/dL (ref 0.40–1.20)
GFR: 90.85 mL/min (ref >60.00)
GLUCOSE: 176 mg/dL — AB (ref 70–99)
POTASSIUM: 4.2 meq/L (ref 3.5–5.1)
Sodium: 138 mEq/L (ref 135–145)
Total Bilirubin: 0.4 mg/dL (ref 0.2–1.2)
Total Protein: 6.8 g/dL (ref 6.0–8.3)

## 2015-02-04 LAB — CBC WITH DIFFERENTIAL/PLATELET
BASOS ABS: 0 10*3/uL (ref 0.0–0.1)
BASOS PCT: 0.5 % (ref 0.0–3.0)
EOS ABS: 0.1 10*3/uL (ref 0.0–0.7)
Eosinophils Relative: 1.7 % (ref 0.0–5.0)
HEMATOCRIT: 39.3 % (ref 36.0–46.0)
HEMOGLOBIN: 12.8 g/dL (ref 12.0–15.0)
LYMPHS PCT: 30.5 % (ref 12.0–46.0)
Lymphs Abs: 1.7 10*3/uL (ref 0.7–4.0)
MCHC: 32.4 g/dL (ref 30.0–36.0)
MCV: 91.6 fl (ref 78.0–100.0)
MONOS PCT: 8.7 % (ref 3.0–12.0)
Monocytes Absolute: 0.5 10*3/uL (ref 0.1–1.0)
NEUTROS ABS: 3.3 10*3/uL (ref 1.4–7.7)
Neutrophils Relative %: 58.6 % (ref 43.0–77.0)
PLATELETS: 299 10*3/uL (ref 150.0–400.0)
RBC: 4.3 Mil/uL (ref 3.87–5.11)
RDW: 15.4 % (ref 11.5–15.5)
WBC: 5.6 10*3/uL (ref 4.0–10.5)

## 2015-02-04 LAB — LIPID PANEL
CHOLESTEROL: 172 mg/dL (ref 0–200)
HDL: 56.4 mg/dL (ref >39.00)
LDL CALC: 96 mg/dL (ref 0–99)
NonHDL: 115.57
TRIGLYCERIDES: 99 mg/dL (ref 0.0–149.0)
Total CHOL/HDL Ratio: 3
VLDL: 19.8 mg/dL (ref 0.0–40.0)

## 2015-02-04 LAB — TSH: TSH: 1.62 u[IU]/mL (ref 0.35–4.50)

## 2015-02-04 LAB — HEMOGLOBIN A1C: Hgb A1c MFr Bld: 7.7 % — ABNORMAL HIGH (ref 4.6–6.5)

## 2015-02-04 LAB — MICROALBUMIN / CREATININE URINE RATIO
Creatinine,U: 158.9 mg/dL
Microalb Creat Ratio: 1.1 mg/g (ref 0.0–30.0)
Microalb, Ur: 1.8 mg/dL (ref 0.0–1.9)

## 2015-02-04 LAB — LDL CHOLESTEROL, DIRECT: Direct LDL: 103 mg/dL

## 2015-02-04 NOTE — Telephone Encounter (Signed)
Don't forget to add the IgE that was ordereD LAST YEAR

## 2015-02-04 NOTE — Telephone Encounter (Signed)
Labs and dx?  

## 2015-02-05 LAB — IGE: IGE (IMMUNOGLOBULIN E), SERUM: 12 kU/L (ref <115)

## 2015-02-06 ENCOUNTER — Ambulatory Visit (INDEPENDENT_AMBULATORY_CARE_PROVIDER_SITE_OTHER): Payer: Medicare Other | Admitting: Internal Medicine

## 2015-02-06 ENCOUNTER — Ambulatory Visit
Admission: RE | Admit: 2015-02-06 | Discharge: 2015-02-06 | Disposition: A | Discharge status: Home / Self Care | Payer: Medicare Other | Source: Ambulatory Visit | Attending: Internal Medicine | Admitting: Internal Medicine

## 2015-02-06 ENCOUNTER — Encounter: Payer: Self-pay | Admitting: Internal Medicine

## 2015-02-06 VITALS — BP 120/76 | HR 74 | Temp 97.4°F | Resp 12 | Ht 59.0 in | Wt 136.5 lb

## 2015-02-06 DIAGNOSIS — R938 Abnormal findings on diagnostic imaging of other specified body structures: Secondary | ICD-10-CM | POA: Diagnosis not present

## 2015-02-06 DIAGNOSIS — I10 Essential (primary) hypertension: Secondary | ICD-10-CM

## 2015-02-06 DIAGNOSIS — Z79899 Other long term (current) drug therapy: Secondary | ICD-10-CM | POA: Diagnosis not present

## 2015-02-06 DIAGNOSIS — Z7289 Other problems related to lifestyle: Secondary | ICD-10-CM | POA: Diagnosis not present

## 2015-02-06 DIAGNOSIS — E119 Type 2 diabetes mellitus without complications: Secondary | ICD-10-CM | POA: Diagnosis not present

## 2015-02-06 DIAGNOSIS — J181 Lobar pneumonia, unspecified organism: Secondary | ICD-10-CM | POA: Diagnosis not present

## 2015-02-06 DIAGNOSIS — R9389 Abnormal findings on diagnostic imaging of other specified body structures: Secondary | ICD-10-CM

## 2015-02-06 DIAGNOSIS — E785 Hyperlipidemia, unspecified: Secondary | ICD-10-CM

## 2015-02-06 NOTE — Patient Instructions (Signed)
No changes to meds today  Try going for a brisk walk (15 min) after your "problem meal" to lower your BS  Return in 3 months

## 2015-02-06 NOTE — Progress Notes (Signed)
Subjective:  Patient ID: Emily Ryan, female    DOB: 1945-05-11  Age: 70 y.o. MRN: [id number]  CC: The primary encounter diagnosis was Abnormal chest x-ray. Diagnoses of Diabetes mellitus without complication ([place]), Other problems related to lifestyle, Long-term use of high-risk medication, Essential hypertension, and Hyperlipidemia were also pertinent to this visit.  HPI Emily Ryan presents for follow up on DM , hypertension and hyperlipidemia.  She has had some loss of control during the holidays and afterward. Not exercising as much as before.  Highest sugars was 180 post prandial.  fasting have been as high as 160 .  She is taking her medications as directed and following a low GI diet now that the holidays are over.  No burning or tingling.   Outpatient Prescriptions Prior to Visit  Medication Sig Dispense Refill  . Calcium Carb-Cholecalciferol 600-100 MG-UNIT CAPS Take 1 tablet by mouth daily. Two by mouth daily 90 capsule 3  . escitalopram (LEXAPRO) 5 MG tablet Take 1 tablet (5 mg total) by mouth daily. 90 tablet 2  . esomeprazole (NEXIUM) 20 MG capsule Take 1 capsule (20 mg total) by mouth daily before breakfast. 90 capsule 3  . folic acid (FOLVITE) 1 MG tablet Take 1 mg by mouth daily.    Marland Kitchen glucose blood (FREESTYLE LITE) test strip Use as instructed to test blood sugar twice daily 200 each 3  . hydrochlorothiazide (HYDRODIURIL) 12.5 MG tablet Take 1 tablet (12.5 mg total) by mouth daily. 90 tablet 3  . lisinopril (PRINIVIL,ZESTRIL) 20 MG tablet Take 1 tablet (20 mg total) by mouth daily. 90 tablet 3  . loratadine (CLARITIN) 10 MG tablet Take 1 tablet (10 mg total) by mouth daily. 90 tablet 3  . metFORMIN (GLUCOPHAGE XR) 750 MG 24 hr tablet Take 1 tablet (750 mg total) by mouth daily with breakfast. 90 tablet 3  . methotrexate (RHEUMATREX) 2.5 MG tablet Take 15 mg by mouth once a week.    . montelukast (SINGULAIR) 10 MG tablet Take 1 tablet (10 mg total) by mouth at bedtime.  90 tablet 3  . Multiple Vitamin (MULTIVITAMIN) capsule Take 1 capsule by mouth daily.      . Omega-3 Fatty Acids (FISH OIL) 1000 MG CAPS Take 1 capsule (1,000 mg total) by mouth daily. 90 capsule 3  . penciclovir (DENAVIR) 1 % cream Apply 1 application topically every 2 (two) hours. 1.5 g 0  . Precision Thin Lancets MISC Use as directed to check sugars twice daily 180 each 3  . simvastatin (ZOCOR) 40 MG tablet Take 1 tablet (40 mg total) by mouth daily. 90 tablet 3  . vitamin E 400 UNIT capsule Take 400 Units by mouth daily.      Marland Kitchen azithromycin (ZITHROMAX) 250 MG tablet Take 2 tabs on the first day then 1 tab for four days. 6 each 0  . Umeclidinium-Vilanterol (ANORO ELLIPTA) 62.5-25 MCG/INH AEPB Inhale 1 puff into the lungs daily. 60 each 5   No facility-administered medications prior to visit.    Review of Systems;  Patient denies headache, fevers, malaise, unintentional weight loss, skin rash, eye pain, sinus congestion and sinus pain, sore throat, dysphagia,  hemoptysis , cough, dyspnea, wheezing, chest pain, palpitations, orthopnea, edema, abdominal pain, nausea, melena, diarrhea, constipation, flank pain, dysuria, hematuria, urinary  Frequency, nocturia, numbness, tingling, seizures,  Focal weakness, Loss of consciousness,  Tremor, insomnia, depression, anxiety, and suicidal ideation.      Objective:  BP 120/76 mmHg  Pulse 74  Temp(Src) 97.4 F (36.3 C) (Oral)  Resp 12  Ht 4\' 11"  (1.499 m)  Wt 136 lb 8 oz (61.916 kg)  BMI 27.55 kg/m2  SpO2 96%  BP Readings from Last 3 Encounters:  02/06/15 120/76  11/10/14 116/60  10/01/14 118/60    Wt Readings from Last 3 Encounters:  02/06/15 136 lb 8 oz (61.916 kg)  11/10/14 137 lb (62.143 kg)  10/01/14 135 lb 2 oz (61.292 kg)    General appearance: alert, cooperative and appears stated age Ears: normal TM's and external ear canals both ears Throat: lips, mucosa, and tongue normal; teeth and gums normal Neck: no adenopathy, no  carotid bruit, supple, symmetrical, trachea midline and thyroid not enlarged, symmetric, no tenderness/mass/nodules Back: symmetric, no curvature. ROM normal. No CVA tenderness. Lungs: clear to auscultation bilaterally Heart: regular rate and rhythm, S1, S2 normal, no murmur, click, rub or gallop Abdomen: soft, non-tender; bowel sounds normal; no masses,  no organomegaly Pulses: 2+ and symmetric Skin: Skin color, texture, turgor normal. No rashes or lesions Lymph nodes: Cervical, supraclavicular, and axillary nodes normal.  Lab Results  Component Value Date   HGBA1C 7.7* 02/04/2015   HGBA1C 6.8* 09/29/2014   HGBA1C 6.6* 05/26/2014    Lab Results  Component Value Date   CREATININE 0.68 02/04/2015   CREATININE 0.69 09/29/2014   CREATININE 0.68 05/26/2014    Lab Results  Component Value Date   WBC 5.6 02/04/2015   HGB 12.8 02/04/2015   HCT 39.3 02/04/2015   PLT 299.0 02/04/2015   GLUCOSE 176* 02/04/2015   CHOL 172 02/04/2015   TRIG 99.0 02/04/2015   HDL 56.40 02/04/2015   LDLDIRECT 103.0 02/04/2015   LDLCALC 96 02/04/2015   ALT 29 02/04/2015   AST 26 02/04/2015   NA 138 02/04/2015   K 4.2 02/04/2015   CL 101 02/04/2015   CREATININE 0.68 02/04/2015   BUN 12 02/04/2015   CO2 30 02/04/2015   TSH 1.62 02/04/2015   HGBA1C 7.7* 02/04/2015   MICROALBUR 1.8 02/04/2015    Dg Chest 2 View  11/10/2014  CLINICAL DATA:  Cough and congestion. EXAM: CHEST  2 VIEW COMPARISON:  Comparison made to prior study 11/23/2010, and 05/31/2009. FINDINGS: Mediastinum hilar structures normal. Minimal infiltrate in the right middle lobe cannot be excluded. No pleural effusion or pneumothorax. Heart size stable. No acute bony abnormality . IMPRESSION: Minimal infiltrate right middle lobe cannot be excluded Followup PA and lateral chest X-ray is recommended in 3-4 weeks following trial of antibiotic therapy to ensure resolution and exclude underlying malignancy. Electronically Signed   By: Marcello Moores   Register   On: 11/10/2014 14:51    Assessment & Plan:   Problem List Items Addressed This Visit    Essential hypertension    Well controlled on current regimen. Renal function stable, no changes today.  Lab Results  Component Value Date   CREATININE 0.68 02/04/2015   Lab Results  Component Value Date   NA 138 02/04/2015   K 4.2 02/04/2015   CL 101 02/04/2015   CO2 30 02/04/2015           Hyperlipidemia    LDL and triglycerides are near  goal on  simvastatin 40 mg daily.  She has no side effects and liver enzymes are normal. No changes today.  Lab Results  Component Value Date   CHOL 172 02/04/2015   HDL 56.40 02/04/2015   LDLCALC 96 02/04/2015   LDLDIRECT 103.0 02/04/2015   TRIG 99.0 02/04/2015   CHOLHDL  3 02/04/2015   Lab Results  Component Value Date   ALT 29 02/04/2015   AST 26 02/04/2015   ALKPHOS 83 02/04/2015   BILITOT 0.4 02/04/2015                     Diabetes mellitus without complication (HCC)    Her diabetes remains less well controlled on current medications,  And she has seen a gradual increase in her A1c over the last 9 months. Reviewed her diet and her exercise . She is exercising regularly. She is eating a lot of pasta. Will resume glipizide 2.5 mg twice daily .  She is up-to-date on annual eye exams. Foot exam was normal today. She has had her urine microalbumin to creatinine ratio ordered several times but not done. She will return for this today as she has already voided today.  Lab Results  Component Value Date   HGBA1C 7.7* 02/04/2015   Lab Results  Component Value Date   MICROALBUR 1.8 02/04/2015          Relevant Medications   glipiZIDE (GLUCOTROL) 5 MG tablet   Other Relevant Orders   Comprehensive metabolic panel   Hemoglobin A1c   LDL cholesterol, direct   Lipid panel   Long-term use of high-risk medication   Relevant Orders   CBC with Differential/Platelet    Other Visit Diagnoses    Abnormal chest x-ray     -  Primary    Relevant Orders    DG Chest 2 View (Completed)    Other problems related to lifestyle        Relevant Orders    Hepatitis C antibody       I have discontinued Ms. Driggs's Umeclidinium-Vilanterol and azithromycin. I am also having her start on glipiZIDE. Additionally, I am having her maintain her vitamin E, multivitamin, methotrexate, PRECISION THIN LANCETS, folic acid, Fish Oil, Calcium Carb-Cholecalciferol, penciclovir, glucose blood, montelukast, escitalopram, simvastatin, metFORMIN, lisinopril, loratadine, hydrochlorothiazide, and esomeprazole.  Meds ordered this encounter  Medications  . glipiZIDE (GLUCOTROL) 5 MG tablet    Sig: Take 0.5 tablets (2.5 mg total) by mouth 2 (two) times daily before a meal.    Dispense:  90 tablet    Refill:  3   A total of 25 minutes of face to face time was spent with patient more than half of which was spent in counselling about the above mentioned conditions  and coordination of care  Medications Discontinued During This Encounter  Medication Reason  . azithromycin (ZITHROMAX) 250 MG tablet Completed Course  . Umeclidinium-Vilanterol (ANORO ELLIPTA) 62.5-25 MCG/INH AEPB Completed Course    Follow-up: Return in about 3 months (around 05/06/2015) for follow up diabetes.   Crecencio Mc, MD

## 2015-02-06 NOTE — Progress Notes (Signed)
Pre-visit discussion using our clinic review tool. No additional management support is needed unless otherwise documented below in the visit note.  

## 2015-02-08 ENCOUNTER — Telehealth: Payer: Self-pay | Admitting: Internal Medicine

## 2015-02-08 DIAGNOSIS — E1165 Type 2 diabetes mellitus with hyperglycemia: Secondary | ICD-10-CM | POA: Insufficient documentation

## 2015-02-08 DIAGNOSIS — E119 Type 2 diabetes mellitus without complications: Secondary | ICD-10-CM | POA: Insufficient documentation

## 2015-02-08 DIAGNOSIS — E1169 Type 2 diabetes mellitus with other specified complication: Secondary | ICD-10-CM | POA: Insufficient documentation

## 2015-02-08 DIAGNOSIS — IMO0002 Reserved for concepts with insufficient information to code with codable children: Secondary | ICD-10-CM | POA: Insufficient documentation

## 2015-02-08 DIAGNOSIS — E782 Mixed hyperlipidemia: Secondary | ICD-10-CM | POA: Insufficient documentation

## 2015-02-08 MED ORDER — GLIPIZIDE 5 MG PO TABS: 2.5000 mg | ORAL_TABLET | Freq: Two times a day (BID) | ORAL | Status: DC

## 2015-02-08 NOTE — Assessment & Plan Note (Signed)
LDL and triglycerides are near  goal on  simvastatin 40 mg daily.  She has no side effects and liver enzymes are normal. No changes today.  Lab Results  Component Value Date   CHOL 172 02/04/2015   HDL 56.40 02/04/2015   LDLCALC 96 02/04/2015   LDLDIRECT 103.0 02/04/2015   TRIG 99.0 02/04/2015   CHOLHDL 3 02/04/2015   Lab Results  Component Value Date   ALT 29 02/04/2015   AST 26 02/04/2015   ALKPHOS 83 02/04/2015   BILITOT 0.4 02/04/2015

## 2015-02-08 NOTE — Assessment & Plan Note (Signed)
Well controlled on current regimen. Renal function stable, no changes today.  Lab Results  Component Value Date   CREATININE 0.68 02/04/2015   Lab Results  Component Value Date   NA 138 02/04/2015   K 4.2 02/04/2015   CL 101 02/04/2015   CO2 30 02/04/2015

## 2015-02-08 NOTE — Telephone Encounter (Signed)
My Chart message sent

## 2015-02-08 NOTE — Assessment & Plan Note (Signed)
Her diabetes remains less well controlled on current medications,  And she has seen a gradual increase in her A1c over the last 9 months. Reviewed her diet and her exercise . She is exercising regularly. She is eating a lot of pasta. Will resume glipizide 2.5 mg twice daily .  She is up-to-date on annual eye exams. Foot exam was normal today. She has had her urine microalbumin to creatinine ratio ordered several times but not done. She will return for this today as she has already voided today.  Lab Results  Component Value Date   HGBA1C 7.7* 02/04/2015   Lab Results  Component Value Date   MICROALBUR 1.8 02/04/2015

## 2015-02-11 ENCOUNTER — Other Ambulatory Visit: Payer: Self-pay

## 2015-02-11 MED ORDER — GLIPIZIDE 5 MG PO TABS: 2.5000 mg | ORAL_TABLET | Freq: Two times a day (BID) | ORAL | Status: DC

## 2015-03-27 DIAGNOSIS — C44612 Basal cell carcinoma of skin of right upper limb, including shoulder: Secondary | ICD-10-CM | POA: Diagnosis not present

## 2015-03-27 DIAGNOSIS — Z08 Encounter for follow-up examination after completed treatment for malignant neoplasm: Secondary | ICD-10-CM | POA: Diagnosis not present

## 2015-03-27 DIAGNOSIS — C44712 Basal cell carcinoma of skin of right lower limb, including hip: Secondary | ICD-10-CM | POA: Diagnosis not present

## 2015-03-27 DIAGNOSIS — D485 Neoplasm of uncertain behavior of skin: Secondary | ICD-10-CM | POA: Diagnosis not present

## 2015-03-27 DIAGNOSIS — L820 Inflamed seborrheic keratosis: Secondary | ICD-10-CM | POA: Diagnosis not present

## 2015-03-27 DIAGNOSIS — L400 Psoriasis vulgaris: Secondary | ICD-10-CM | POA: Diagnosis not present

## 2015-03-27 DIAGNOSIS — C44319 Basal cell carcinoma of skin of other parts of face: Secondary | ICD-10-CM | POA: Diagnosis not present

## 2015-03-27 DIAGNOSIS — C44519 Basal cell carcinoma of skin of other part of trunk: Secondary | ICD-10-CM | POA: Diagnosis not present

## 2015-05-04 ENCOUNTER — Other Ambulatory Visit (INDEPENDENT_AMBULATORY_CARE_PROVIDER_SITE_OTHER): Payer: Medicare Other

## 2015-05-04 DIAGNOSIS — Z79899 Other long term (current) drug therapy: Secondary | ICD-10-CM

## 2015-05-04 DIAGNOSIS — E119 Type 2 diabetes mellitus without complications: Secondary | ICD-10-CM | POA: Diagnosis not present

## 2015-05-04 DIAGNOSIS — Z7289 Other problems related to lifestyle: Secondary | ICD-10-CM | POA: Diagnosis not present

## 2015-05-04 HISTORY — PX: MOHS SURGERY: SHX181

## 2015-05-04 LAB — HEMOGLOBIN A1C: Hgb A1c MFr Bld: 7.5 % — ABNORMAL HIGH (ref 4.6–6.5)

## 2015-05-04 LAB — CBC WITH DIFFERENTIAL/PLATELET
BASOS ABS: 0 10*3/uL (ref 0.0–0.1)
BASOS PCT: 0.3 % (ref 0.0–3.0)
EOS ABS: 0.1 10*3/uL (ref 0.0–0.7)
Eosinophils Relative: 1.8 % (ref 0.0–5.0)
HEMATOCRIT: 37 % (ref 36.0–46.0)
HEMOGLOBIN: 12.4 g/dL (ref 12.0–15.0)
LYMPHS PCT: 34.3 % (ref 12.0–46.0)
Lymphs Abs: 1.7 10*3/uL (ref 0.7–4.0)
MCHC: 33.5 g/dL (ref 30.0–36.0)
MCV: 87.1 fl (ref 78.0–100.0)
MONO ABS: 0.6 10*3/uL (ref 0.1–1.0)
Monocytes Relative: 11.6 % (ref 3.0–12.0)
Neutro Abs: 2.7 10*3/uL (ref 1.4–7.7)
Neutrophils Relative %: 52 % (ref 43.0–77.0)
Platelets: 250 10*3/uL (ref 150.0–400.0)
RBC: 4.25 Mil/uL (ref 3.87–5.11)
RDW: 17.7 % — AB (ref 11.5–15.5)
WBC: 5.1 10*3/uL (ref 4.0–10.5)

## 2015-05-04 LAB — COMPREHENSIVE METABOLIC PANEL
ALK PHOS: 60 U/L (ref 39–117)
ALT: 25 U/L (ref 0–35)
AST: 23 U/L (ref 0–37)
Albumin: 4 g/dL (ref 3.5–5.2)
BILIRUBIN TOTAL: 0.5 mg/dL (ref 0.2–1.2)
BUN: 14 mg/dL (ref 6–23)
CO2: 31 meq/L (ref 19–32)
Calcium: 9.3 mg/dL (ref 8.4–10.5)
Chloride: 101 mEq/L (ref 96–112)
Creatinine, Ser: 0.71 mg/dL (ref 0.40–1.20)
GFR: 86.37 mL/min (ref >60.00)
GLUCOSE: 141 mg/dL — AB (ref 70–99)
Potassium: 4.7 mEq/L (ref 3.5–5.1)
SODIUM: 138 meq/L (ref 135–145)
TOTAL PROTEIN: 6.7 g/dL (ref 6.0–8.3)

## 2015-05-04 LAB — LIPID PANEL
CHOL/HDL RATIO: 3
Cholesterol: 176 mg/dL (ref 0–200)
HDL: 53 mg/dL (ref >39.00)
LDL CALC: 101 mg/dL — AB (ref 0–99)
NONHDL: 123.25
TRIGLYCERIDES: 112 mg/dL (ref 0.0–149.0)
VLDL: 22.4 mg/dL (ref 0.0–40.0)

## 2015-05-04 LAB — MICROALBUMIN / CREATININE URINE RATIO
CREATININE, U: 52.5 mg/dL
MICROALB/CREAT RATIO: 1.3 mg/g (ref 0.0–30.0)
Microalb, Ur: <0.7 mg/dL (ref 0.0–1.9)

## 2015-05-04 LAB — HEPATITIS C ANTIBODY: HCV AB: NEGATIVE

## 2015-05-04 LAB — LDL CHOLESTEROL, DIRECT: Direct LDL: 104 mg/dL

## 2015-05-06 ENCOUNTER — Encounter: Payer: Self-pay | Admitting: Internal Medicine

## 2015-05-06 ENCOUNTER — Ambulatory Visit (INDEPENDENT_AMBULATORY_CARE_PROVIDER_SITE_OTHER): Payer: Medicare Other | Admitting: Internal Medicine

## 2015-05-06 VITALS — BP 120/78 | HR 67 | Temp 97.5°F | Resp 12 | Ht 59.0 in | Wt 135.5 lb

## 2015-05-06 DIAGNOSIS — Z780 Asymptomatic menopausal state: Secondary | ICD-10-CM | POA: Diagnosis not present

## 2015-05-06 DIAGNOSIS — E119 Type 2 diabetes mellitus without complications: Secondary | ICD-10-CM | POA: Diagnosis not present

## 2015-05-06 DIAGNOSIS — I10 Essential (primary) hypertension: Secondary | ICD-10-CM | POA: Diagnosis not present

## 2015-05-06 MED ORDER — ESCITALOPRAM OXALATE 5 MG PO TABS: 5.0000 mg | ORAL_TABLET | Freq: Every day | ORAL | Status: DC

## 2015-05-06 NOTE — Progress Notes (Signed)
Subjective:  Patient ID: Emily Ryan, female    DOB: 1945-07-19  Age: 70 y.o. MRN: [id number]  CC: The primary encounter diagnosis was Diabetes mellitus without complication ([place]). Diagnoses of Essential hypertension and Postmenopausal estrogen deficiency were also pertinent to this visit.  HPI Emily Ryan presents for   3 month follow up on diabetes.  Patient has no complaints today.  Patient is following a low glycemic index diet and taking all prescribed medications regularly without side effects.  Fasting sugars have been under less than 140 most of the time and post prandials have been under 160 except on rare occasions. Patient is exercising about 3 times per week and intentionally trying to lose weight .  Patient has had an eye exam in the last 12 months and checks feet regularly for signs of infection.  Patient does not walk barefoot outside, and denies any numbness tingling or burning in feet. Patient is up to date on all recommended vaccinations.  Labs reviewed.   Her a1c has improved from 7.7 to 7.5 and her cholesterol remains stable   Her psoriasis has been quiescent., and her MTx dose has been reduced   Lab Results  Component Value Date   HGBA1C 7.5* 05/04/2015   She has been taking  1/2 glipizide at night.  Frequently has lows in the 90's after skipping meal and after working in yard   Needs dexa scan if insurance will finally pay for it.     Outpatient Prescriptions Prior to Visit  Medication Sig Dispense Refill  . Calcium Carb-Cholecalciferol 600-100 MG-UNIT CAPS Take 1 tablet by mouth daily. Two by mouth daily 90 capsule 3  . esomeprazole (NEXIUM) 20 MG capsule Take 1 capsule (20 mg total) by mouth daily before breakfast. 90 capsule 3  . folic acid (FOLVITE) 1 MG tablet Take 1 mg by mouth daily.    Marland Kitchen glipiZIDE (GLUCOTROL) 5 MG tablet Take 0.5 tablets (2.5 mg total) by mouth 2 (two) times daily before a meal. 90 tablet 3  . glucose blood (FREESTYLE LITE) test  strip Use as instructed to test blood sugar twice daily 200 each 3  . hydrochlorothiazide (HYDRODIURIL) 12.5 MG tablet Take 1 tablet (12.5 mg total) by mouth daily. 90 tablet 3  . lisinopril (PRINIVIL,ZESTRIL) 20 MG tablet Take 1 tablet (20 mg total) by mouth daily. 90 tablet 3  . loratadine (CLARITIN) 10 MG tablet Take 1 tablet (10 mg total) by mouth daily. 90 tablet 3  . metFORMIN (GLUCOPHAGE XR) 750 MG 24 hr tablet Take 1 tablet (750 mg total) by mouth daily with breakfast. 90 tablet 3  . methotrexate (RHEUMATREX) 2.5 MG tablet Take 15 mg by mouth once a week.    . montelukast (SINGULAIR) 10 MG tablet Take 1 tablet (10 mg total) by mouth at bedtime. 90 tablet 3  . Multiple Vitamin (MULTIVITAMIN) capsule Take 1 capsule by mouth daily.      . Omega-3 Fatty Acids (FISH OIL) 1000 MG CAPS Take 1 capsule (1,000 mg total) by mouth daily. 90 capsule 3  . penciclovir (DENAVIR) 1 % cream Apply 1 application topically every 2 (two) hours. 1.5 g 0  . Precision Thin Lancets MISC Use as directed to check sugars twice daily 180 each 3  . simvastatin (ZOCOR) 40 MG tablet Take 1 tablet (40 mg total) by mouth daily. 90 tablet 3  . vitamin E 400 UNIT capsule Take 400 Units by mouth daily.      Marland Kitchen escitalopram (LEXAPRO)  5 MG tablet Take 1 tablet (5 mg total) by mouth daily. 90 tablet 2   No facility-administered medications prior to visit.    Review of Systems;  Patient denies headache, fevers, malaise, unintentional weight loss, skin rash, eye pain, sinus congestion and sinus pain, sore throat, dysphagia,  hemoptysis , cough, dyspnea, wheezing, chest pain, palpitations, orthopnea, edema, abdominal pain, nausea, melena, diarrhea, constipation, flank pain, dysuria, hematuria, urinary  Frequency, nocturia, numbness, tingling, seizures,  Focal weakness, Loss of consciousness,  Tremor, insomnia, depression, anxiety, and suicidal ideation.      Objective:  BP 120/78 mmHg  Pulse 67  Temp(Src) 97.5 F (36.4 C)  (Oral)  Resp 12  Ht 4\' 11"  (1.499 m)  Wt 135 lb 8 oz (61.462 kg)  BMI 27.35 kg/m2  SpO2 97%  BP Readings from Last 3 Encounters:  05/06/15 120/78  02/06/15 120/76  11/10/14 116/60    Wt Readings from Last 3 Encounters:  05/06/15 135 lb 8 oz (61.462 kg)  02/06/15 136 lb 8 oz (61.916 kg)  11/10/14 137 lb (62.143 kg)    General appearance: alert, cooperative and appears stated age Ears: normal TM's and external ear canals both ears Throat: lips, mucosa, and tongue normal; teeth and gums normal Neck: no adenopathy, no carotid bruit, supple, symmetrical, trachea midline and thyroid not enlarged, symmetric, no tenderness/mass/nodules Back: symmetric, no curvature. ROM normal. No CVA tenderness. Lungs: clear to auscultation bilaterally Heart: regular rate and rhythm, S1, S2 normal, no murmur, click, rub or gallop Abdomen: soft, non-tender; bowel sounds normal; no masses,  no organomegaly Pulses: 2+ and symmetric Skin: Skin color, texture, turgor normal. No rashes or lesions Lymph nodes: Cervical, supraclavicular, and axillary nodes normal.  Lab Results  Component Value Date   HGBA1C 7.5* 05/04/2015   HGBA1C 7.7* 02/04/2015   HGBA1C 6.8* 09/29/2014    Lab Results  Component Value Date   CREATININE 0.71 05/04/2015   CREATININE 0.68 02/04/2015   CREATININE 0.69 09/29/2014    Lab Results  Component Value Date   WBC 5.1 05/04/2015   HGB 12.4 05/04/2015   HCT 37.0 05/04/2015   PLT 250.0 05/04/2015   GLUCOSE 141* 05/04/2015   CHOL 176 05/04/2015   TRIG 112.0 05/04/2015   HDL 53.00 05/04/2015   LDLDIRECT 104.0 05/04/2015   LDLCALC 101* 05/04/2015   ALT 25 05/04/2015   AST 23 05/04/2015   NA 138 05/04/2015   K 4.7 05/04/2015   CL 101 05/04/2015   CREATININE 0.71 05/04/2015   BUN 14 05/04/2015   CO2 31 05/04/2015   TSH 1.62 02/04/2015   HGBA1C 7.5* 05/04/2015   MICROALBUR <0.7 05/04/2015    Dg Chest 2 View  02/06/2015  CLINICAL DATA:  Followup after treatment of  right middle lobe pneumonia and initially diagnosed in November, 2016. EXAM: CHEST  2 VIEW COMPARISON:  11/10/2014 and earlier. FINDINGS: Cardiac silhouette normal in size, unchanged. Thoracic aorta atherosclerotic, unchanged. Hilar and mediastinal contours otherwise unremarkable. Lungs clear. Bronchovascular markings normal. Pulmonary vascularity normal. No visible pleural effusions. No pneumothorax. No residual right middle lobe consolidation. Prominent anterior and right paracardiac fat pad. IMPRESSION: No acute cardiopulmonary disease. Resolution of right middle lobe pneumonia since November, 2016. Electronically Signed   By: Evangeline Dakin M.D.   On: 02/06/2015 14:10    Assessment & Plan:   Problem List Items Addressed This Visit    Essential hypertension    Well controlled on current regimen. Renal function stable, no changes today.  Lab Results  Component  Value Date   CREATININE 0.71 05/04/2015   Lab Results  Component Value Date   NA 138 05/04/2015   K 4.7 05/04/2015   CL 101 05/04/2015   CO2 31 05/04/2015         Diabetes mellitus without complication ([place]) - Primary    Improving control, but not at goal.  Hypoglycemia protocol reviewed and reasons for recurrence discussed.   Advised to increase her evening dose of glipzide to 5 mg and continue 2.5 mg in the am.    Lab Results  Component Value Date   HGBA1C 7.5* 05/04/2015   Lab Results  Component Value Date   MICROALBUR <0.7 05/04/2015          Postmenopausal estrogen deficiency    DEXA to be ordered.       Relevant Orders   DG Bone Density    A total of 25 minutes of face to face time was spent with patient more than half of which was spent in counselling about the above mentioned conditions  and coordination of care   I am having Ms. Batz maintain her vitamin E, multivitamin, methotrexate, PRECISION THIN LANCETS, folic acid, Fish Oil, Calcium Carb-Cholecalciferol, penciclovir, glucose blood,  montelukast, simvastatin, metFORMIN, lisinopril, loratadine, hydrochlorothiazide, esomeprazole, glipiZIDE, and escitalopram.  Meds ordered this encounter  Medications  . escitalopram (LEXAPRO) 5 MG tablet    Sig: Take 1 tablet (5 mg total) by mouth daily.    Dispense:  90 tablet    Refill:  2    Medications Discontinued During This Encounter  Medication Reason  . escitalopram (LEXAPRO) 5 MG tablet Reorder    Follow-up: Return in about 3 months (around 08/06/2015) for follow up diabetes.   Crecencio Mc, MD

## 2015-05-06 NOTE — Progress Notes (Signed)
Pre-visit discussion using our clinic review tool. No additional management support is needed unless otherwise documented below in the visit note.  

## 2015-05-06 NOTE — Patient Instructions (Signed)
You can increase the evening dose of glipizide to 5 mg before dinner,  But continue 2.5 mg with breakfast.   Tell your husband to try oral magnesium tablets or tumeric in capsule form for leg cramps .  Make sure you each are getting at least 3 16 ounce servings of water daily .

## 2015-05-07 DIAGNOSIS — Z780 Asymptomatic menopausal state: Secondary | ICD-10-CM | POA: Insufficient documentation

## 2015-05-07 NOTE — Assessment & Plan Note (Addendum)
Improving control, but not at goal.  Hypoglycemia protocol reviewed and reasons for recurrence discussed.   Advised to increase her evening dose of glipzide to 5 mg and continue 2.5 mg in the am.    Lab Results  Component Value Date   HGBA1C 7.5* 05/04/2015   Lab Results  Component Value Date   MICROALBUR <0.7 05/04/2015

## 2015-05-07 NOTE — Assessment & Plan Note (Signed)
DEXA to be ordered.

## 2015-05-07 NOTE — Assessment & Plan Note (Signed)
Well controlled on current regimen. Renal function stable, no changes today.  Lab Results  Component Value Date   CREATININE 0.71 05/04/2015   Lab Results  Component Value Date   NA 138 05/04/2015   K 4.7 05/04/2015   CL 101 05/04/2015   CO2 31 05/04/2015

## 2015-05-15 DIAGNOSIS — C44712 Basal cell carcinoma of skin of right lower limb, including hip: Secondary | ICD-10-CM | POA: Diagnosis not present

## 2015-05-15 DIAGNOSIS — L905 Scar conditions and fibrosis of skin: Secondary | ICD-10-CM | POA: Diagnosis not present

## 2015-05-27 DIAGNOSIS — C44519 Basal cell carcinoma of skin of other part of trunk: Secondary | ICD-10-CM | POA: Diagnosis not present

## 2015-05-27 DIAGNOSIS — C44612 Basal cell carcinoma of skin of right upper limb, including shoulder: Secondary | ICD-10-CM | POA: Diagnosis not present

## 2015-05-28 DIAGNOSIS — C44319 Basal cell carcinoma of skin of other parts of face: Secondary | ICD-10-CM | POA: Diagnosis not present

## 2015-06-10 ENCOUNTER — Encounter: Payer: Self-pay | Admitting: Internal Medicine

## 2015-06-10 MED ORDER — GLUCOSE BLOOD VI STRP: ORAL_STRIP | Status: DC

## 2015-07-13 ENCOUNTER — Ambulatory Visit
Admission: RE | Admit: 2015-07-13 | Discharge: 2015-07-13 | Disposition: A | Discharge status: Home / Self Care | Payer: Medicare Other | Source: Ambulatory Visit | Attending: Internal Medicine | Admitting: Internal Medicine

## 2015-07-13 DIAGNOSIS — M8588 Other specified disorders of bone density and structure, other site: Secondary | ICD-10-CM | POA: Diagnosis not present

## 2015-07-13 DIAGNOSIS — M85851 Other specified disorders of bone density and structure, right thigh: Secondary | ICD-10-CM | POA: Diagnosis not present

## 2015-07-13 DIAGNOSIS — Z780 Asymptomatic menopausal state: Secondary | ICD-10-CM | POA: Diagnosis not present

## 2015-07-13 DIAGNOSIS — M8580 Other specified disorders of bone density and structure, unspecified site: Secondary | ICD-10-CM | POA: Diagnosis present

## 2015-07-15 ENCOUNTER — Encounter: Payer: Self-pay | Admitting: Internal Medicine

## 2015-07-23 DIAGNOSIS — E119 Type 2 diabetes mellitus without complications: Secondary | ICD-10-CM | POA: Diagnosis not present

## 2015-07-24 LAB — HM DIABETES EYE EXAM

## 2015-07-27 ENCOUNTER — Encounter: Payer: Self-pay | Admitting: Surgical

## 2015-08-10 ENCOUNTER — Other Ambulatory Visit: Payer: Medicare Other

## 2015-08-12 ENCOUNTER — Ambulatory Visit: Payer: Medicare Other | Admitting: Internal Medicine

## 2015-08-21 ENCOUNTER — Telehealth: Payer: Self-pay

## 2015-08-21 DIAGNOSIS — E119 Type 2 diabetes mellitus without complications: Secondary | ICD-10-CM

## 2015-08-21 NOTE — Telephone Encounter (Signed)
Pt coming for fasting labs 08/24/15. Please place future orders. Thank you.

## 2015-08-21 NOTE — Telephone Encounter (Signed)
Done, thanks

## 2015-08-24 ENCOUNTER — Other Ambulatory Visit (INDEPENDENT_AMBULATORY_CARE_PROVIDER_SITE_OTHER): Payer: Medicare Other

## 2015-08-24 DIAGNOSIS — E119 Type 2 diabetes mellitus without complications: Secondary | ICD-10-CM | POA: Diagnosis not present

## 2015-08-24 LAB — LIPID PANEL
Cholesterol: 184 mg/dL (ref 0–200)
HDL: 60.4 mg/dL (ref >39.00)
LDL CALC: 107 mg/dL — AB (ref 0–99)
NONHDL: 123.28
Total CHOL/HDL Ratio: 3
Triglycerides: 81 mg/dL (ref 0.0–149.0)
VLDL: 16.2 mg/dL (ref 0.0–40.0)

## 2015-08-24 LAB — MICROALBUMIN / CREATININE URINE RATIO
Creatinine,U: 42.1 mg/dL
Microalb Creat Ratio: 1.7 mg/g (ref 0.0–30.0)
Microalb, Ur: <0.7 mg/dL (ref 0.0–1.9)

## 2015-08-24 LAB — COMPREHENSIVE METABOLIC PANEL
ALK PHOS: 68 U/L (ref 39–117)
ALT: 27 U/L (ref 0–35)
AST: 26 U/L (ref 0–37)
Albumin: 4.3 g/dL (ref 3.5–5.2)
BILIRUBIN TOTAL: 0.4 mg/dL (ref 0.2–1.2)
BUN: 17 mg/dL (ref 6–23)
CO2: 28 mEq/L (ref 19–32)
Calcium: 8.9 mg/dL (ref 8.4–10.5)
Chloride: 100 mEq/L (ref 96–112)
Creatinine, Ser: 0.67 mg/dL (ref 0.40–1.20)
GFR: 92.27 mL/min (ref >60.00)
GLUCOSE: 140 mg/dL — AB (ref 70–99)
Potassium: 4.4 mEq/L (ref 3.5–5.1)
Sodium: 136 mEq/L (ref 135–145)
TOTAL PROTEIN: 7 g/dL (ref 6.0–8.3)

## 2015-08-24 LAB — HEMOGLOBIN A1C: HEMOGLOBIN A1C: 6.9 % — AB (ref 4.6–6.5)

## 2015-08-25 ENCOUNTER — Encounter: Payer: Self-pay | Admitting: Internal Medicine

## 2015-08-26 ENCOUNTER — Encounter: Payer: Self-pay | Admitting: Internal Medicine

## 2015-08-26 ENCOUNTER — Ambulatory Visit (INDEPENDENT_AMBULATORY_CARE_PROVIDER_SITE_OTHER): Payer: Medicare Other | Admitting: Internal Medicine

## 2015-08-26 VITALS — BP 132/68 | HR 74 | Temp 97.7°F | Resp 12 | Ht 59.0 in | Wt 134.8 lb

## 2015-08-26 DIAGNOSIS — I10 Essential (primary) hypertension: Secondary | ICD-10-CM | POA: Diagnosis not present

## 2015-08-26 DIAGNOSIS — E785 Hyperlipidemia, unspecified: Secondary | ICD-10-CM | POA: Diagnosis not present

## 2015-08-26 DIAGNOSIS — Z23 Encounter for immunization: Secondary | ICD-10-CM | POA: Diagnosis not present

## 2015-08-26 DIAGNOSIS — Z780 Asymptomatic menopausal state: Secondary | ICD-10-CM

## 2015-08-26 DIAGNOSIS — Z0000 Encounter for general adult medical examination without abnormal findings: Secondary | ICD-10-CM

## 2015-08-26 DIAGNOSIS — H833X3 Noise effects on inner ear, bilateral: Secondary | ICD-10-CM

## 2015-08-26 DIAGNOSIS — E119 Type 2 diabetes mellitus without complications: Secondary | ICD-10-CM

## 2015-08-26 DIAGNOSIS — R011 Cardiac murmur, unspecified: Secondary | ICD-10-CM

## 2015-08-26 DIAGNOSIS — C44319 Basal cell carcinoma of skin of other parts of face: Secondary | ICD-10-CM

## 2015-08-26 MED ORDER — MONTELUKAST SODIUM 10 MG PO TABS: 10.0000 mg | ORAL_TABLET | Freq: Every day | ORAL | 3 refills | Status: DC

## 2015-08-26 MED ORDER — LISINOPRIL 20 MG PO TABS: 20.0000 mg | ORAL_TABLET | Freq: Every day | ORAL | 3 refills | Status: DC

## 2015-08-26 MED ORDER — GLIPIZIDE 5 MG PO TABS: 2.5000 mg | ORAL_TABLET | Freq: Two times a day (BID) | ORAL | 3 refills | Status: DC

## 2015-08-26 MED ORDER — OMEPRAZOLE 20 MG PO CPDR: 20.0000 mg | DELAYED_RELEASE_CAPSULE | Freq: Every day | ORAL | 2 refills | Status: DC

## 2015-08-26 MED ORDER — LORATADINE 10 MG PO TABS: 10.0000 mg | ORAL_TABLET | Freq: Every day | ORAL | 3 refills | Status: DC

## 2015-08-26 MED ORDER — PENCICLOVIR 1 % EX CREA: 1.0000 "application " | TOPICAL_CREAM | CUTANEOUS | 1 refills | Status: DC

## 2015-08-26 NOTE — Progress Notes (Signed)
Subjective:   Emily Ryan is a 70 y.o. female who presents for Medicare Annual (Subsequent) preventive examination.  Review of Systems:  No ROS.  Medicare Wellness Visit.  Cardiac Risk Factors include: advanced age (>15men, >66 women);hypertension;diabetes mellitus     Objective:     Vitals: BP 132/68   Pulse 74   Temp 97.7 F (36.5 C) (Oral)   Resp 12   Ht 4\' 11"  (1.499 m)   Wt 134 lb 12 oz (61.1 kg)   SpO2 96%   BMI 27.22 kg/m   Body mass index is 27.22 kg/m.   Tobacco History  Smoking Status  . Never Smoker  Smokeless Tobacco  . Never Used     Counseling given: Not Answered   Past Medical History:  Diagnosis Date  . Complicated pregnancy    1st pregnancy complicated by post operative hemorrhage and 2ng complicated by epidural  . Diabetes mellitus   . Diverticulosis of colon (without mention of hemorrhage)   . Guttate psoriasis   . Hyperlipidemia   . Hypertension   . Menopausal disorder    Past Surgical History:  Procedure Laterality Date  . ABDOMINAL HYSTERECTOMY    . APPENDECTOMY  Dec 2012  . BLADDER REPAIR  1991   lift   . CYSTOCELE REPAIR    . HERNIA REPAIR    . MOHS SURGERY N/A 05/2015   Face  . PARTIAL COLECTOMY  Nov 2012   left, secondary to diverticular perf  . RECTOCELE REPAIR    . SEPTOPLASTY  1969  . TONSILLECTOMY  1953  . VARICOSE VEIN SURGERY  1974   right leg    Family History  Problem Relation Age of Onset  . Cancer Maternal Grandmother     colon ca  . Diabetes Sister   . Diabetes Brother    History  Sexual Activity  . Sexual activity: Yes  . Birth control/ protection: Post-menopausal    Outpatient Encounter Prescriptions as of 08/26/2015  Medication Sig  . Calcium Carb-Cholecalciferol 600-100 MG-UNIT CAPS Take 1 tablet by mouth daily. Two by mouth daily  . escitalopram (LEXAPRO) 5 MG tablet Take 1 tablet (5 mg total) by mouth daily.  . folic acid (FOLVITE) 1 MG tablet Take 1 mg by mouth daily.  Marland Kitchen glipiZIDE  (GLUCOTROL) 5 MG tablet Take 0.5 tablets (2.5 mg total) by mouth 2 (two) times daily before a meal.  . glucose blood (FREESTYLE LITE) test strip Use as instructed to test blood sugar twice daily  . hydrochlorothiazide (HYDRODIURIL) 12.5 MG tablet Take 1 tablet (12.5 mg total) by mouth daily.  Marland Kitchen lisinopril (PRINIVIL,ZESTRIL) 20 MG tablet Take 1 tablet (20 mg total) by mouth daily.  Marland Kitchen loratadine (CLARITIN) 10 MG tablet Take 1 tablet (10 mg total) by mouth daily.  . metFORMIN (GLUCOPHAGE XR) 750 MG 24 hr tablet Take 1 tablet (750 mg total) by mouth daily with breakfast.  . methotrexate (RHEUMATREX) 2.5 MG tablet Take 15 mg by mouth once a week.  . montelukast (SINGULAIR) 10 MG tablet Take 1 tablet (10 mg total) by mouth at bedtime.  . Multiple Vitamin (MULTIVITAMIN) capsule Take 1 capsule by mouth daily.    . Omega-3 Fatty Acids (FISH OIL) 1000 MG CAPS Take 1 capsule (1,000 mg total) by mouth daily.  Marland Kitchen penciclovir (DENAVIR) 1 % cream Apply 1 application topically every 2 (two) hours.  . Precision Thin Lancets MISC Use as directed to check sugars twice daily  . simvastatin (ZOCOR) 40 MG tablet  Take 1 tablet (40 mg total) by mouth daily.  . vitamin E 400 UNIT capsule Take 400 Units by mouth daily.    . [DISCONTINUED] esomeprazole (NEXIUM) 20 MG capsule Take 1 capsule (20 mg total) by mouth daily before breakfast.  . [DISCONTINUED] glipiZIDE (GLUCOTROL) 5 MG tablet Take 0.5 tablets (2.5 mg total) by mouth 2 (two) times daily before a meal.  . [DISCONTINUED] lisinopril (PRINIVIL,ZESTRIL) 20 MG tablet Take 1 tablet (20 mg total) by mouth daily.  . [DISCONTINUED] loratadine (CLARITIN) 10 MG tablet Take 1 tablet (10 mg total) by mouth daily.  . [DISCONTINUED] montelukast (SINGULAIR) 10 MG tablet Take 1 tablet (10 mg total) by mouth at bedtime.  . [DISCONTINUED] penciclovir (DENAVIR) 1 % cream Apply 1 application topically every 2 (two) hours.  Marland Kitchen omeprazole (PRILOSEC) 20 MG capsule Take 1 capsule (20 mg  total) by mouth daily.   No facility-administered encounter medications on file as of 08/26/2015.     Activities of Daily Living In your present state of health, do you have any difficulty performing the following activities: 08/26/2015  Hearing? N  Vision? N  Difficulty concentrating or making decisions? N  Walking or climbing stairs? N  Dressing or bathing? N  Doing errands, shopping? N  Preparing Food and eating ? N  Using the Toilet? N  In the past six months, have you accidently leaked urine? N  Do you have problems with loss of bowel control? N  Managing your Medications? N  Managing your Finances? N  Housekeeping or managing your Housekeeping? N  Some recent data might be hidden    Patient Care Team: Crecencio Mc, MD as PCP - General (Internal Medicine)    Assessment:    This is a routine wellness examination for [place]. The goal of the wellness visit is to assist the patient how to close the gaps in care and create a preventative care plan for the patient.   Taking calcium as appropriate/Osteoporosis risk reviewed.  Medications reviewed; taking without issues or barriers.  Safety issues reviewed; smoke and carbon monoxide detectors in the home. Firearms locked in a safe in the home. Wears seatbelts when driving or riding with others. No violence in the home.  No identified risk were noted; The patient was oriented x 3; appropriate in dress and manner and no objective failures at ADL's or IADL's.   Body mass index; discussed the importance of a healthy diet, water intake and exercise. Educational material provided.  Influenza vaccine administered.  Health maintenance gaps; closed.  Patient Concerns: She admits difficulty hearing a whisper, television and conversation.  Audiology referral placed with [facility].  Educational material provided. Follow up as directed.  Exercise Activities and Dietary recommendations Current Exercise Habits: Structured  exercise class, Type of exercise: stretching;calisthenics, Time (Minutes): 60, Frequency (Times/Week): 4, Weekly Exercise (Minutes/Week): 240, Intensity: Moderate  Goals    . Increase water intake      Fall Risk Fall Risk  08/26/2015 02/06/2015 01/28/2014 05/10/2013 12/05/2011  Falls in the past year? Yes No No No No  Number falls in past yr: 1 - - - -  Injury with Fall? No - - - -  Follow up Education provided;Falls prevention discussed - - - -   Depression Screen PHQ 2/9 Scores 08/26/2015 02/06/2015 01/28/2014 05/10/2013  PHQ - 2 Score 0 0 0 0     Cognitive Testing MMSE - Mini Mental State Exam 08/26/2015  Orientation to time 5  Orientation to Place 5  Registration 3  Attention/ Calculation 5  Recall 3  Language- name 2 objects 2  Language- repeat 1  Language- follow 3 step command 3  Language- read & follow direction 1  Write a sentence 1  Copy design 1  Total score 30    Immunization History  Administered Date(s) Administered  . Influenza Split 10/19/2011  . Influenza, High Dose Seasonal PF 10/01/2014  . Influenza,inj,Quad PF,36+ Mos 10/31/2012, 09/26/2013, 08/26/2015  . Pneumococcal Conjugate-13 01/28/2013  . Pneumococcal Polysaccharide-23 11/02/2010  . Tdap 11/16/2010  . Zoster 10/19/2007   Screening Tests Health Maintenance  Topic Date Due  . HEMOGLOBIN A1C  02/24/2016  . MAMMOGRAM  03/02/2016  . FOOT EXAM  05/05/2016  . OPHTHALMOLOGY EXAM  07/23/2016  . TETANUS/TDAP  11/15/2020  . COLONOSCOPY  08/16/2023  . INFLUENZA VACCINE  Completed  . DEXA SCAN  Completed  . ZOSTAVAX  Completed  . Hepatitis C Screening  Completed  . PNA vac Low Risk Adult  Completed      Plan:    End of life planning; Advance aging; Advanced directives discussed. Educational material provided for her to start the conversation with her family.  Copy of HCPOA/Living Will requested upon completion.  Time discussing this topic is 20 minutes.  Encouraged to follow up with PCP as  needed.  During the course of the visit the patient was educated and counseled about the following appropriate screening and preventive services:   Vaccines to include Pneumoccal, Influenza, Hepatitis B, Td, Zostavax, HCV  Electrocardiogram  Cardiovascular Disease  Colorectal cancer screening  Bone density screening  Diabetes screening  Glaucoma screening  Mammography/PAP  Nutrition counseling   Patient Instructions (the written plan) was given to the patient.   Varney Biles, LPN  [id number]

## 2015-08-26 NOTE — Progress Notes (Signed)
Subjective:  Patient ID: Emily Ryan, female    DOB: 05/27/1945  Age: 70 y.o. MRN: [id number]  CC: The primary encounter diagnosis was Heart murmur on physical examination. Diagnoses of Diabetes mellitus without complication ([place]), Essential hypertension, Hyperlipidemia, Medicare annual wellness, subsequent, Hearing loss d/t noise, bilateral, Postmenopausal estrogen deficiency, Basal cell carcinoma of right forehead, and Heart murmur, systolic were also pertinent to this visit.  HPI Emily Ryan presents for 4 month follow up on diabetes.  Patient has no complaints today.  Patient is following a low glycemic index diet and taking all prescribed medications regularly without side effects.  Fasting sugars have been under less than 130 most of the time and post prandials have been under 150 except on rare occasions. Patient is exercising about 4 times per week and intentionally trying to lose weight .  Patient has had an eye exam in the last 12 months and checks feet regularly for signs of infection.  Patient does not walk barefoot outside,  And denies an numbness tingling or burning in feet. Patient is up to date on all recommended vaccinations    Had Moh's procedure on her right forehead in May by Dr Anne Fu at [facility] for [facility]. The incision has healed well,  She hasmi nimal residual swelling of the right eyelid at the eyelash border that is apparent today .   Lab Results  Component Value Date   HGBA1C 6.9 (H) 08/24/2015    Outpatient Medications Prior to Visit  Medication Sig Dispense Refill  . Calcium Carb-Cholecalciferol 600-100 MG-UNIT CAPS Take 1 tablet by mouth daily. Two by mouth daily 90 capsule 3  . escitalopram (LEXAPRO) 5 MG tablet Take 1 tablet (5 mg total) by mouth daily. 90 tablet 2  . folic acid (FOLVITE) 1 MG tablet Take 1 mg by mouth daily.    Marland Kitchen glucose blood (FREESTYLE LITE) test strip Use as instructed to test blood sugar twice daily 200 each 3  .  hydrochlorothiazide (HYDRODIURIL) 12.5 MG tablet Take 1 tablet (12.5 mg total) by mouth daily. 90 tablet 3  . metFORMIN (GLUCOPHAGE XR) 750 MG 24 hr tablet Take 1 tablet (750 mg total) by mouth daily with breakfast. 90 tablet 3  . methotrexate (RHEUMATREX) 2.5 MG tablet Take 15 mg by mouth once a week.    . Multiple Vitamin (MULTIVITAMIN) capsule Take 1 capsule by mouth daily.      . Omega-3 Fatty Acids (FISH OIL) 1000 MG CAPS Take 1 capsule (1,000 mg total) by mouth daily. 90 capsule 3  . Precision Thin Lancets MISC Use as directed to check sugars twice daily 180 each 3  . simvastatin (ZOCOR) 40 MG tablet Take 1 tablet (40 mg total) by mouth daily. 90 tablet 3  . vitamin E 400 UNIT capsule Take 400 Units by mouth daily.      Marland Kitchen esomeprazole (NEXIUM) 20 MG capsule Take 1 capsule (20 mg total) by mouth daily before breakfast. 90 capsule 3  . glipiZIDE (GLUCOTROL) 5 MG tablet Take 0.5 tablets (2.5 mg total) by mouth 2 (two) times daily before a meal. 90 tablet 3  . lisinopril (PRINIVIL,ZESTRIL) 20 MG tablet Take 1 tablet (20 mg total) by mouth daily. 90 tablet 3  . loratadine (CLARITIN) 10 MG tablet Take 1 tablet (10 mg total) by mouth daily. 90 tablet 3  . montelukast (SINGULAIR) 10 MG tablet Take 1 tablet (10 mg total) by mouth at bedtime. 90 tablet 3  . penciclovir (DENAVIR) 1 %  cream Apply 1 application topically every 2 (two) hours. 1.5 g 0   No facility-administered medications prior to visit.     Review of Systems;  Patient denies headache, fevers, malaise, unintentional weight loss, skin rash, eye pain, sinus congestion and sinus pain, sore throat, dysphagia,  hemoptysis , cough, dyspnea, wheezing, chest pain, palpitations, orthopnea, edema, abdominal pain, nausea, melena, diarrhea, constipation, flank pain, dysuria, hematuria, urinary  Frequency, nocturia, numbness, tingling, seizures,  Focal weakness, Loss of consciousness,  Tremor, insomnia, depression, anxiety, and suicidal ideation.       Objective:  BP 132/68   Pulse 74   Temp 97.7 F (36.5 C) (Oral)   Resp 12   Ht 4\' 11"  (1.499 m)   Wt 134 lb 12 oz (61.1 kg)   SpO2 96%   BMI 27.22 kg/m   BP Readings from Last 3 Encounters:  08/26/15 132/68  05/06/15 120/78  02/06/15 120/76    Wt Readings from Last 3 Encounters:  08/26/15 134 lb 12 oz (61.1 kg)  05/06/15 135 lb 8 oz (61.5 kg)  02/06/15 136 lb 8 oz (61.9 kg)    General appearance: alert, cooperative and appears stated age Face: well healing vertical surgical scar rihgt lower forehead extending to lower border of eyebrow Right upper eyelid with minimal swelling at eyelash line.  Ears: normal TM's and external ear canals both ears Throat: lips, mucosa, and tongue normal; teeth and gums normal Neck: no adenopathy, no carotid bruit, supple, symmetrical, trachea midline and thyroid not enlarged, symmetric, no tenderness/mass/nodules Back: symmetric, no curvature. ROM normal. No CVA tenderness. Lungs: clear to auscultation bilaterally Heart: regular rate and rhythm, S1, S2 normal, systolic murmur without radiation  Abdomen: soft, non-tender; bowel sounds normal; no masses,  no organomegaly Pulses: 2+ and symmetric Skin: Skin color, texture, turgor normal. No rashes or lesions Lymph nodes: Cervical, supraclavicular, and axillary nodes normal.  Lab Results  Component Value Date   HGBA1C 6.9 (H) 08/24/2015   HGBA1C 7.5 (H) 05/04/2015   HGBA1C 7.7 (H) 02/04/2015    Lab Results  Component Value Date   CREATININE 0.67 08/24/2015   CREATININE 0.71 05/04/2015   CREATININE 0.68 02/04/2015    Lab Results  Component Value Date   WBC 5.1 05/04/2015   HGB 12.4 05/04/2015   HCT 37.0 05/04/2015   PLT 250.0 05/04/2015   GLUCOSE 140 (H) 08/24/2015   CHOL 184 08/24/2015   TRIG 81.0 08/24/2015   HDL 60.40 08/24/2015   LDLDIRECT 104.0 05/04/2015   LDLCALC 107 (H) 08/24/2015   ALT 27 08/24/2015   AST 26 08/24/2015   NA 136 08/24/2015   K 4.4 08/24/2015    CL 100 08/24/2015   CREATININE 0.67 08/24/2015   BUN 17 08/24/2015   CO2 28 08/24/2015   TSH 1.62 02/04/2015   HGBA1C 6.9 (H) 08/24/2015   MICROALBUR <0.7 08/24/2015    Dg Bone Density  Result Date: 07/13/2015 EXAM: DUAL X-RAY ABSORPTIOMETRY (DXA) FOR BONE MINERAL DENSITY IMPRESSION: Dear Dr. Derrel Nip, Your patient [name] completed a BMD test on 07/13/2015 using the [place] (analysis version: 14.10) manufactured by [employer]. The following summarizes the results of our evaluation. PATIENT BIOGRAPHICAL: Name: Shatyra, Criollo Patient ID: [id number] Birth Date: 1945/04/15 Height: 58.0 in. Gender: Female Exam Date: 07/13/2015 Weight: 135.5 lbs. Indications: Caucasian, Hysterectomy, Postmenopausal Fractures: Treatments: Calcium, methotrexate, Vit D ASSESSMENT: The BMD measured at Femur Neck Right is 0.766 g/cm2 with a T-score of -2.0. This patient is considered OSTEOPENIC according to  World Health Organization [facility]) criteria. Site Region Measured Measured WHO Young Adult BMD Date       Age      Classification T-score AP Spine L1-L4 07/13/2015 70.7 Normal -0.7 1.107 g/cm2 DualFemur Neck Right 07/13/2015 70.7 Osteopenia -2.0 0.766 g/cm2 World Health Organization [facility]) criteria for post-menopausal, Caucasian Women: Normal:       T-score at or above -1 SD Osteopenia:   T-score between -1 and -2.5 SD Osteoporosis: T-score at or below -2.5 SD RECOMMENDATIONS: [place] recommends that FDA-approved medical therapies be considered in postmenopausal women and men age 109 or older with a: 1. Hip or vertebral (clinical or morphometric) fracture. 2. T-score of < -2.5 at the spine or hip. 3. Ten-year fracture probability by FRAX of 3% or greater for hip fracture or 20% or greater for major osteoporotic fracture. All treatment decisions require clinical judgment and consideration of individual patient factors, including patient preferences, co-morbidities, previous drug use,  risk factors not captured in the FRAX model (e.g. falls, vitamin D deficiency, increased bone turnover, interval significant decline in bone density) and possible under - or over-estimation of fracture risk by FRAX. All patients should ensure an adequate intake of dietary calcium (1200 mg/d) and vitamin D (800 IU daily) unless contraindicated. FOLLOW-UP: People with diagnosed cases of osteoporosis or at high risk for fracture should have regular bone mineral density tests. For patients eligible for Medicare, routine testing is allowed once every 2 years. The testing frequency can be increased to one year for patients who have rapidly progressing disease, those who are receiving or discontinuing medical therapy to restore bone mass, or have additional risk factors. I have reviewed this report, and agree with the above findings. Mark A. Thornton Papas, M.D. [facility] Radiology Dear Dr. Derrel Nip, Your patient Emily Ryan completed a FRAX assessment on 07/13/2015 using the [place] (analysis version: 14.10) manufactured by [employer]. The following summarizes the results of our evaluation. PATIENT BIOGRAPHICAL: Name: Sitlaly, Panzer Patient ID: [id number] Birth Date: Dec 03, 1945 Height:    58.0 in. Gender:     Female    Age:        69.7       Weight:    135.5 lbs. Ethnicity:  White                            Exam Date: 07/13/2015 FRAX* RESULTS:  (version: 3.5) 10-year Probability of Fracture1 Major Osteoporotic Fracture2 Hip Fracture 11.7% 2.3% Population: [place] (Caucasian) Risk Factors: None Bb Based on Femur (Right) Neck BMD 1 -The 10-year probability of fracture may be lower than reported if the patient has received treatment. 2 -Major Osteoporotic Fracture: Clinical Spine, Forearm, Hip or Shoulder *FRAX is a Materials engineer of the [employer] for Metabolic Bone Disease, a [place] (WHO) [facility]. ASSESSMENT: The probability of a major osteoporotic  fracture is 11.7% within the next ten years. The probability of a hip fracture is 2.3% within the next ten years. I have reviewed this report and agree with the above findings. Mark A. Thornton Papas, M.D. [facility] Radiology Electronically Signed   By: Lavonia Dana M.D.   On: 07/13/2015 14:08    Assessment & Plan:   Problem List Items Addressed This Visit    Essential hypertension    Well controlled on current regimen. Renal function stable, no changes today.  Lab Results  Component Value Date   CREATININE 0.67  08/24/2015   Lab Results  Component Value Date   NA 136 08/24/2015   K 4.4 08/24/2015   CL 100 08/24/2015   CO2 28 08/24/2015         Relevant Medications   lisinopril (PRINIVIL,ZESTRIL) 20 MG tablet   Diabetes mellitus without complication (HCC)    Improving control, now  at goal I. Current glipizide dose is 2.5 mg  Twice daily,  Unless she exercises in the morning. On those days she suspends the morning dose due to recurrent hypoglycemia with theray    Lab Results  Component Value Date   HGBA1C 6.9 (H) 08/24/2015   Lab Results  Component Value Date   MICROALBUR <0.7 08/24/2015          Relevant Medications   glipiZIDE (GLUCOTROL) 5 MG tablet   lisinopril (PRINIVIL,ZESTRIL) 20 MG tablet   Other Relevant Orders   Hemoglobin A1c   Comprehensive metabolic panel   Microalbumin / creatinine urine ratio   Postmenopausal estrogen deficiency    Bone Density scores from July reviewed.  she has osteopenia,  Moderate.  Would repeat in 2 years and consider therapy then if there is a significant change. Continue calcium, vitamin d and weight bearing exercise on a regular basis.       Basal cell carcinoma of right forehead    S/p Moh's excision July 2017 by Anne Fu,  [facility] . She is wearing sunblock and hats when outside to protect her face.       Heart murmur, systolic    Etiology unclear.  She is asymptomatic.  Referral to Cardiology for ECHO       Hyperlipidemia    Relevant Medications   lisinopril (PRINIVIL,ZESTRIL) 20 MG tablet   Other Relevant Orders   Lipid Panel w/o Chol/HDL Ratio    Other Visit Diagnoses    Heart murmur on physical examination    -  Primary   Relevant Orders   Ambulatory referral to Cardiology   Medicare annual wellness, subsequent       Hearing loss d/t noise, bilateral       Relevant Orders   Ambulatory referral to ENT      I have discontinued Ms. Viles's esomeprazole. I am also having her start on omeprazole. Additionally, I am having her maintain her vitamin E, multivitamin, methotrexate, PRECISION THIN LANCETS, folic acid, Fish Oil, Calcium Carb-Cholecalciferol, simvastatin, metFORMIN, hydrochlorothiazide, escitalopram, glucose blood, glipiZIDE, lisinopril, loratadine, montelukast, and penciclovir.  Meds ordered this encounter  Medications  . omeprazole (PRILOSEC) 20 MG capsule    Sig: Take 1 capsule (20 mg total) by mouth daily.    Dispense:  90 capsule    Refill:  2  . glipiZIDE (GLUCOTROL) 5 MG tablet    Sig: Take 0.5 tablets (2.5 mg total) by mouth 2 (two) times daily before a meal.    Dispense:  90 tablet    Refill:  3  . lisinopril (PRINIVIL,ZESTRIL) 20 MG tablet    Sig: Take 1 tablet (20 mg total) by mouth daily.    Dispense:  90 tablet    Refill:  3  . loratadine (CLARITIN) 10 MG tablet    Sig: Take 1 tablet (10 mg total) by mouth daily.    Dispense:  90 tablet    Refill:  3  . montelukast (SINGULAIR) 10 MG tablet    Sig: Take 1 tablet (10 mg total) by mouth at bedtime.    Dispense:  90 tablet    Refill:  3  .  penciclovir (DENAVIR) 1 % cream    Sig: Apply 1 application topically every 2 (two) hours.    Dispense:  1.5 g    Refill:  1    Medications Discontinued During This Encounter  Medication Reason  . glipiZIDE (GLUCOTROL) 5 MG tablet Reorder  . esomeprazole (NEXIUM) 20 MG capsule   . lisinopril (PRINIVIL,ZESTRIL) 20 MG tablet Reorder  . loratadine (CLARITIN) 10 MG tablet Reorder  .  montelukast (SINGULAIR) 10 MG tablet Reorder  . penciclovir (DENAVIR) 1 % cream Reorder    Follow-up: Return in about 4 months (around 12/26/2015) for follow up diabetes.   Crecencio Mc, MD

## 2015-08-26 NOTE — Progress Notes (Signed)
Pre-visit discussion using our clinic review tool. No additional management support is needed unless otherwise documented below in the visit note.  

## 2015-08-26 NOTE — Patient Instructions (Addendum)
Emily Ryan , Thank you for taking time to come for your Medicare Wellness Visit. I appreciate your ongoing commitment to your health goals. Please review the following plan we discussed and let me know if I can assist you in the future.   These are the goals we discussed: Goals    . Increase water intake       This is a list of the screening recommended for you and due dates:  Health Maintenance  Topic Date Due  . Hemoglobin A1C  02/24/2016  . Mammogram  03/02/2016  . Complete foot exam   05/05/2016  . Eye exam for diabetics  07/23/2016  . Tetanus Vaccine  11/15/2020  . Colon Cancer Screening  08/16/2023  . Flu Shot  Completed  . DEXA scan (bone density measurement)  Completed  . Shingles Vaccine  Completed  .  Hepatitis C: One time screening is recommended by Center for Disease Control  (CDC) for  adults born from [birth year] through 1966.   Completed  . Pneumonia vaccines  Completed     Your diabetes remains under excellent control  And your cholesterol and other labs are also normal.  Do not resume glipizide  In the morning unless your 2 hour post prandials sugars are > 160 at lunch time   See you in 4 months     Hearing Loss Hearing loss is a partial or total loss of the ability to hear. This can be temporary or permanent, and it can happen in one or both ears. Hearing loss may be referred to as deafness. Medical care is necessary to treat hearing loss properly and to prevent the condition from getting worse. Your hearing may partially or completely come back, depending on what caused your hearing loss and how severe it is. In some cases, hearing loss is permanent. CAUSES Common causes of hearing loss include:   Too much wax in the ear canal.   Infection of the ear canal or middle ear.   Fluid in the middle ear.   Injury to the ear or surrounding area.   An object stuck in the ear.   Prolonged exposure to loud sounds, such as music.  Less common causes of  hearing loss include:   Tumors in the ear.   Viral or bacterial infections, such as meningitis.   A hole in the eardrum (perforated eardrum).  Problems with the hearing nerve that sends signals between the brain and the ear.  Certain medicines.  SYMPTOMS  Symptoms of this condition may include:  Difficulty telling the difference between sounds.  Difficulty following a conversation when there is background noise.  Lack of response to sounds in your environment. This may be most noticeable when you do not respond to startling sounds.  Needing to turn up the volume on the television, radio, etc.  Ringing in the ears.  Dizziness.  Pain in the ears. DIAGNOSIS This condition is diagnosed based on a physical exam and a hearing test (audiometry). The audiometry test will be performed by a hearing specialist (audiologist). You may also be referred to an ear, nose, and throat (ENT) specialist (otolaryngologist).  TREATMENT Treatment for recent onset of hearing loss may include:   Ear wax removal.   Being prescribed medicines to prevent infection (antibiotics).   Being prescribed medicines to reduce inflammation (corticosteroids).  HOME CARE INSTRUCTIONS  If you were prescribed an antibiotic medicine, take it as told by your health care provider. Do not stop taking the antibiotic even  if you start to feel better.  Take over-the-counter and prescription medicines only as told by your health care provider.  Avoid loud noises.   Return to your normal activities as told by your health care provider. Ask your health care provider what activities are safe for you.  Keep all follow-up visits as told by your health care provider. This is important. SEEK MEDICAL CARE IF:   You feel dizzy.   You develop new symptoms.   You vomit or feel nauseous.   You have a fever.  SEEK IMMEDIATE MEDICAL CARE IF:  You develop sudden changes in your vision.   You have severe ear  pain.   You have new or increased weakness.  You have a severe headache.   This information is not intended to replace advice given to you by your health care provider. Make sure you discuss any questions you have with your health care provider.   Document Released: 12/20/2004 Document Revised: 09/10/2014 Document Reviewed: 05/07/2014 Elsevier Interactive Patient Education [employer].

## 2015-08-28 DIAGNOSIS — Q2112 Patent foramen ovale: Secondary | ICD-10-CM | POA: Insufficient documentation

## 2015-08-28 DIAGNOSIS — C44319 Basal cell carcinoma of skin of other parts of face: Secondary | ICD-10-CM | POA: Insufficient documentation

## 2015-08-28 DIAGNOSIS — Q211 Atrial septal defect: Secondary | ICD-10-CM | POA: Insufficient documentation

## 2015-08-28 NOTE — Assessment & Plan Note (Addendum)
Improving control, now  at goal I. Current glipizide dose is 2.5 mg  Twice daily,  Unless she exercises in the morning. On those days she suspends the morning dose due to recurrent hypoglycemia with theray    Lab Results  Component Value Date   HGBA1C 6.9 (H) 08/24/2015   Lab Results  Component Value Date   MICROALBUR <0.7 08/24/2015

## 2015-08-28 NOTE — Assessment & Plan Note (Signed)
Well controlled on current regimen. Renal function stable, no changes today.  Lab Results  Component Value Date   CREATININE 0.67 08/24/2015   Lab Results  Component Value Date   NA 136 08/24/2015   K 4.4 08/24/2015   CL 100 08/24/2015   CO2 28 08/24/2015

## 2015-08-28 NOTE — Assessment & Plan Note (Signed)
Bone Density scores from July reviewed.  she has osteopenia,  Moderate.  Would repeat in 2 years and consider therapy then if there is a significant change. Continue calcium, vitamin d and weight bearing exercise on a regular basis.

## 2015-08-28 NOTE — Assessment & Plan Note (Signed)
Etiology unclear.  She is asymptomatic.  Referral to Cardiology for ECHO

## 2015-08-28 NOTE — Assessment & Plan Note (Signed)
S/p Moh's excision July 2017 by Anne Fu,  [facility] . She is wearing sunblock and hats when outside to protect her face.

## 2015-09-01 NOTE — Progress Notes (Signed)
  I have reviewed the above information and agree with above.   Leylanie Woodmansee, MD 

## 2015-09-10 DIAGNOSIS — L821 Other seborrheic keratosis: Secondary | ICD-10-CM | POA: Diagnosis not present

## 2015-09-10 DIAGNOSIS — Z79899 Other long term (current) drug therapy: Secondary | ICD-10-CM | POA: Diagnosis not present

## 2015-09-10 DIAGNOSIS — L400 Psoriasis vulgaris: Secondary | ICD-10-CM | POA: Diagnosis not present

## 2015-09-10 DIAGNOSIS — D485 Neoplasm of uncertain behavior of skin: Secondary | ICD-10-CM | POA: Diagnosis not present

## 2015-09-10 DIAGNOSIS — Z85828 Personal history of other malignant neoplasm of skin: Secondary | ICD-10-CM | POA: Diagnosis not present

## 2015-09-10 DIAGNOSIS — D1801 Hemangioma of skin and subcutaneous tissue: Secondary | ICD-10-CM | POA: Diagnosis not present

## 2015-09-10 DIAGNOSIS — C44612 Basal cell carcinoma of skin of right upper limb, including shoulder: Secondary | ICD-10-CM | POA: Diagnosis not present

## 2015-09-10 NOTE — Addendum Note (Signed)
Addended by: Aviva Signs M on: 09/10/2015 05:01 PM   Modules accepted: Orders

## 2015-09-11 NOTE — Addendum Note (Signed)
Addended by: Karle Barr on: 09/11/2015 08:59 AM   Modules accepted: Orders

## 2015-09-15 DIAGNOSIS — H903 Sensorineural hearing loss, bilateral: Secondary | ICD-10-CM | POA: Diagnosis not present

## 2015-10-07 DIAGNOSIS — L905 Scar conditions and fibrosis of skin: Secondary | ICD-10-CM | POA: Diagnosis not present

## 2015-10-07 DIAGNOSIS — C44612 Basal cell carcinoma of skin of right upper limb, including shoulder: Secondary | ICD-10-CM | POA: Diagnosis not present

## 2015-10-08 ENCOUNTER — Encounter: Payer: Self-pay | Admitting: Internal Medicine

## 2015-10-08 MED ORDER — METFORMIN HCL ER 750 MG PO TB24: 750.0000 mg | ORAL_TABLET | Freq: Every day | ORAL | 3 refills | Status: DC

## 2015-10-21 ENCOUNTER — Ambulatory Visit (INDEPENDENT_AMBULATORY_CARE_PROVIDER_SITE_OTHER): Payer: Medicare Other | Admitting: Cardiovascular Disease

## 2015-10-21 ENCOUNTER — Encounter: Payer: Self-pay | Admitting: Cardiovascular Disease

## 2015-10-21 VITALS — BP 122/46 | HR 75 | Ht 59.0 in | Wt 137.5 lb

## 2015-10-21 DIAGNOSIS — R011 Cardiac murmur, unspecified: Secondary | ICD-10-CM

## 2015-10-21 DIAGNOSIS — I10 Essential (primary) hypertension: Secondary | ICD-10-CM

## 2015-10-21 DIAGNOSIS — I358 Other nonrheumatic aortic valve disorders: Secondary | ICD-10-CM

## 2015-10-21 DIAGNOSIS — E119 Type 2 diabetes mellitus without complications: Secondary | ICD-10-CM

## 2015-10-21 DIAGNOSIS — E7800 Pure hypercholesterolemia, unspecified: Secondary | ICD-10-CM | POA: Diagnosis not present

## 2015-10-21 NOTE — Patient Instructions (Addendum)
Medication Instructions:   No medication changes made  Labwork:  No new labs needed  Testing/Procedures:  We will order an echocardiogram for murmur, aortic valve sclerosis Your physician has requested that you have an echocardiogram. Echocardiography is a painless test that uses sound waves to create images of your heart. It provides your doctor with information about the size and shape of your heart and how well your heart's chambers and valves are working. This procedure takes approximately one hour. There are no restrictions for this procedure.     Follow-Up: It was a pleasure seeing you in the office today. Please call us if you have new issues that need to be addressed before your next appt.  [phone]  Your physician wants you to follow-up in: as needed  If you need a refill on your cardiac medications before your next appointment, please call your pharmacy.    Echocardiogram An echocardiogram, or echocardiography, uses sound waves (ultrasound) to produce an image of your heart. The echocardiogram is simple, painless, obtained within a short period of time, and offers valuable information to your health care provider. The images from an echocardiogram can provide information such as:  Evidence of coronary artery disease (CAD).  Heart size.  Heart muscle function.  Heart valve function.  Aneurysm detection.  Evidence of a past heart attack.  Fluid buildup around the heart.  Heart muscle thickening.  Assess heart valve function. LET [facility] CARE PROVIDER KNOW ABOUT:  Any allergies you have.  All medicines you are taking, including vitamins, herbs, eye drops, creams, and over-the-counter medicines.  Previous problems you or members of your family have had with the use of anesthetics.  Any blood disorders you have.  Previous surgeries you have had.  Medical conditions you have.  Possibility of pregnancy, if this applies. BEFORE THE PROCEDURE   No special preparation is needed. Eat and drink normally.  PROCEDURE   In order to produce an image of your heart, gel will be applied to your chest and a wand-like tool (transducer) will be moved over your chest. The gel will help transmit the sound waves from the transducer. The sound waves will harmlessly bounce off your heart to allow the heart images to be captured in real-time motion. These images will then be recorded.  You may need an IV to receive a medicine that improves the quality of the pictures. AFTER THE PROCEDURE You may return to your normal schedule including diet, activities, and medicines, unless your health care provider tells you otherwise.   This information is not intended to replace advice given to you by your health care provider. Make sure you discuss any questions you have with your health care provider.   Document Released: 12/18/1999 Document Revised: 01/10/2014 Document Reviewed: 08/27/2012 Elsevier Interactive Patient Education [employer].

## 2015-10-21 NOTE — Progress Notes (Signed)
Cardiology Office Note  Date:  10/21/2015   ID:  Emily Ryan, Emily Ryan 12-20-1945, MRN [id number]  PCP:  Crecencio Mc, MD   Chief Complaint  Patient presents with  . other    Ref by PCP due to heart murmur. Meds reviewed verbally with pt.    HPI:  Emily Ryan is a pleasant 70 year old woman with history of diabetes type 2, hypertension, psoriasis, hyperlipidemia, who presents by referral from Dr. Derrel Nip for consultation of her heart murmur.   In general she reports that she is doing well, denies any significant shortness of breath on exertion, no leg edema, no PND orthopnea Denies any tachycardia concerning for arrhythmia  Trying to watch her diet, A1c between the 6 and 7 range Sometimes diet is not as good as she would like Blood pressure generally well controlled at home  Recent lab work reviewed with her showing total cholesterol 180,  normal CBC, BMP  Reports she's been on a statin for many years CT scan 2015: Images were reviewed with her in detail in the office today showing no distal coronary calcifications, no significant aortic atherosclerosis, no significant iliac artery atherosclerosis  Family history discussed with her in detail Mother with bioprosthetic valve, carotid stenosis   PMH:   has a past medical history of Complicated pregnancy; Diabetes mellitus; Diverticulosis of colon (without mention of hemorrhage); Guttate psoriasis; Hyperlipidemia; Hypertension; and Menopausal disorder.  PSH:    Past Surgical History:  Procedure Laterality Date  . ABDOMINAL HYSTERECTOMY    . APPENDECTOMY  Dec 2012  . BLADDER REPAIR  1991   lift   . CYSTOCELE REPAIR    . HERNIA REPAIR    . MOHS SURGERY N/A 05/2015   Face  . PARTIAL COLECTOMY  Nov 2012   left, secondary to diverticular perf  . RECTOCELE REPAIR    . SEPTOPLASTY  1969  . TONSILLECTOMY  1953  . VARICOSE VEIN SURGERY  1974   right leg     Current Outpatient Prescriptions  Medication Sig Dispense Refill   . aspirin 81 MG chewable tablet Chew by mouth daily.    . Calcium Carb-Cholecalciferol 600-100 MG-UNIT CAPS Take 1 tablet by mouth daily. Two by mouth daily 90 capsule 3  . escitalopram (LEXAPRO) 5 MG tablet Take 1 tablet (5 mg total) by mouth daily. 90 tablet 2  . folic acid (FOLVITE) 1 MG tablet Take 1 mg by mouth daily.    Marland Kitchen glipiZIDE (GLUCOTROL) 5 MG tablet Take 0.5 tablets (2.5 mg total) by mouth 2 (two) times daily before a meal. 90 tablet 3  . glucose blood (FREESTYLE LITE) test strip Use as instructed to test blood sugar twice daily 200 each 3  . hydrochlorothiazide (HYDRODIURIL) 12.5 MG tablet Take 1 tablet (12.5 mg total) by mouth daily. 90 tablet 3  . lisinopril (PRINIVIL,ZESTRIL) 20 MG tablet Take 1 tablet (20 mg total) by mouth daily. 90 tablet 3  . loratadine (CLARITIN) 10 MG tablet Take 1 tablet (10 mg total) by mouth daily. 90 tablet 3  . metFORMIN (GLUCOPHAGE XR) 750 MG 24 hr tablet Take 1 tablet (750 mg total) by mouth daily with breakfast. 90 tablet 3  . methotrexate (RHEUMATREX) 2.5 MG tablet Take 15 mg by mouth once a week.    . montelukast (SINGULAIR) 10 MG tablet Take 1 tablet (10 mg total) by mouth at bedtime. 90 tablet 3  . Multiple Vitamin (MULTIVITAMIN) capsule Take 1 capsule by mouth daily.      Marland Kitchen  Omega-3 Fatty Acids (FISH OIL) 1000 MG CAPS Take 1 capsule (1,000 mg total) by mouth daily. 90 capsule 3  . omeprazole (PRILOSEC) 20 MG capsule Take 1 capsule (20 mg total) by mouth daily. 90 capsule 2  . penciclovir (DENAVIR) 1 % cream Apply 1 application topically every 2 (two) hours. 1.5 g 1  . Precision Thin Lancets MISC Use as directed to check sugars twice daily 180 each 3  . simvastatin (ZOCOR) 40 MG tablet Take 1 tablet (40 mg total) by mouth daily. 90 tablet 3  . vitamin E 400 UNIT capsule Take 400 Units by mouth daily.       No current facility-administered medications for this visit.      Allergies:   Review of patient's allergies indicates no known  allergies.   Social History:  The patient  reports that she has never smoked. She has never used smokeless tobacco. She reports that she drinks about 3.0 oz of alcohol per week . She reports that she does not use drugs.   Family History:   family history includes Cancer in her maternal grandmother; Diabetes in her brother, brother, and sister; Heart Problems in her mother; Stroke in her brother.    Review of Systems: Review of Systems  Constitutional: Negative.   Respiratory: Negative.   Cardiovascular: Negative.   Gastrointestinal: Negative.   Musculoskeletal: Negative.   Neurological: Negative.   Psychiatric/Behavioral: Negative.   All other systems reviewed and are negative.    PHYSICAL EXAM: VS:  BP (!) 122/46 (BP Location: Right Arm, Patient Position: Sitting, Cuff Size: Normal)   Pulse 75   Ht 4\' 11"  (1.499 m)   Wt 137 lb 8 oz (62.4 kg)   BMI 27.77 kg/m  , BMI Body mass index is 27.77 kg/m. GEN: Well nourished, well developed, in no acute distress  HEENT: normal  Neck: no JVD, carotid bruits, or masses Cardiac: RRR; 1+ SEM RSB, no rubs, or gallops,no edema  Respiratory:  clear to auscultation bilaterally, normal work of breathing GI: soft, nontender, nondistended, + BS MS: no deformity or atrophy  Skin: warm and dry, no rash Neuro:  Strength and sensation are intact Psych: euthymic mood, full affect    Recent Labs: 02/04/2015: TSH 1.62 05/04/2015: Hemoglobin 12.4; Platelets 250.0 08/24/2015: ALT 27; BUN 17; Creatinine, Ser 0.67; Potassium 4.4; Sodium 136    Lipid Panel Lab Results  Component Value Date   CHOL 184 08/24/2015   HDL 60.40 08/24/2015   LDLCALC 107 (H) 08/24/2015   TRIG 81.0 08/24/2015      Wt Readings from Last 3 Encounters:  10/21/15 137 lb 8 oz (62.4 kg)  08/26/15 134 lb 12 oz (61.1 kg)  05/06/15 135 lb 8 oz (61.5 kg)       ASSESSMENT AND PLAN:  Essential hypertension - Plan: EKG 12-Lead Blood pressure is well controlled on today's  visit. No changes made to the medications.  Heart murmur, systolic - Plan: EKG [id number], ECHOCARDIOGRAM COMPLETE Low-grade murmur, likely secondary to aortic valve sclerosis without significant stenosis   Aortic valve sclerosis Echocardiogram has been ordered to define her murmur  Likely sclerosis without significant stenosis  Discussed pathology with her in detail . Sounds like her mother had similar disease in her 45s required bioprosthetic aortic valve replacement .   Pure hypercholesterolemia Encouraged her to stay on her current dose of simvastatin  Cholesterol very close to goal LDL around 100 for a diabetic  CT scan results and images discussed from 2015 showing  no significant PAD. Very encouraging  Diabetes mellitus without complication ([place]) Dietary guide discussed with her, low carbohydrates, daily exercise   Total encounter time more than 45 minutes  Greater than 50% was spent in counseling and coordination of care with the patient   Disposition:   F/U   as needed  We will call her with the results of her echocardiogram    Orders Placed This Encounter  Procedures  . EKG 12-Lead  . ECHOCARDIOGRAM COMPLETE     Signed, Esmond Plants, M.D., Ph.D. 10/21/2015  [place], [place]

## 2015-11-10 ENCOUNTER — Other Ambulatory Visit: Payer: Self-pay

## 2015-11-10 ENCOUNTER — Ambulatory Visit (INDEPENDENT_AMBULATORY_CARE_PROVIDER_SITE_OTHER): Payer: Medicare Other

## 2015-11-10 DIAGNOSIS — R011 Cardiac murmur, unspecified: Secondary | ICD-10-CM | POA: Diagnosis not present

## 2015-11-12 ENCOUNTER — Encounter: Payer: Self-pay | Admitting: Internal Medicine

## 2015-11-19 ENCOUNTER — Ambulatory Visit (INDEPENDENT_AMBULATORY_CARE_PROVIDER_SITE_OTHER): Payer: Medicare Other | Admitting: Cardiovascular Disease

## 2015-11-19 ENCOUNTER — Encounter: Payer: Self-pay | Admitting: Cardiovascular Disease

## 2015-11-19 VITALS — BP 138/62 | HR 79 | Ht 59.0 in | Wt 139.8 lb

## 2015-11-19 DIAGNOSIS — R931 Abnormal findings on diagnostic imaging of heart and coronary circulation: Secondary | ICD-10-CM

## 2015-11-19 NOTE — Progress Notes (Signed)
Cardiology Office Note  Date:  11/19/2015   ID:  Emily Ryan 02-08-1945, MRN [id number]  PCP:  Crecencio Mc, MD   Chief Complaint  Patient presents with  . other    Follow up test results. Meds reviewed by the pt. verbally. "doing well."     HPI:  Emily Ryan is a pleasant 70 year old woman with history of diabetes type 2, hypertension, psoriasis, hyperlipidemia, who presents For further discussion of her heart murmur and findings on her recent echocardiogram  Echocardiogram showed no significant valve disease. She did have multiple echodensity noted above the aortic valve of unclear etiology. Unable to exclude region of atheroma. Finding is not common and still unclear Results discussed with her in detail  Otherwise she reports that she feels well with no complaints No TIA or stroke type symptoms Currently on simvastatin  total cholesterol 180,    Other past medical history reviewed  been on a statin for many years CT scan 2015: Images were reviewed with her in detail in the office today showing no distal coronary calcifications, no significant aortic atherosclerosis, no significant iliac artery atherosclerosis  Family history discussed with her in detail Mother with bioprosthetic valve, carotid stenosis   PMH:   has a past medical history of Complicated pregnancy; Diabetes mellitus; Diverticulosis of colon (without mention of hemorrhage); Guttate psoriasis; Hyperlipidemia; Hypertension; and Menopausal disorder.  PSH:    Past Surgical History:  Procedure Laterality Date  . ABDOMINAL HYSTERECTOMY    . APPENDECTOMY  Dec 2012  . BLADDER REPAIR  1991   lift   . CYSTOCELE REPAIR    . HERNIA REPAIR    . MOHS SURGERY N/A 05/2015   Face  . PARTIAL COLECTOMY  Nov 2012   left, secondary to diverticular perf  . RECTOCELE REPAIR    . SEPTOPLASTY  1969  . TONSILLECTOMY  1953  . VARICOSE VEIN SURGERY  1974   right leg     Current Outpatient Prescriptions   Medication Sig Dispense Refill  . aspirin 81 MG chewable tablet Chew by mouth daily.    . Calcium Carb-Cholecalciferol 600-100 MG-UNIT CAPS Take 1 tablet by mouth daily. Two by mouth daily 90 capsule 3  . escitalopram (LEXAPRO) 5 MG tablet Take 1 tablet (5 mg total) by mouth daily. 90 tablet 2  . folic acid (FOLVITE) 1 MG tablet Take 1 mg by mouth daily.    Marland Kitchen glipiZIDE (GLUCOTROL) 5 MG tablet Take 0.5 tablets (2.5 mg total) by mouth 2 (two) times daily before a meal. 90 tablet 3  . glucose blood (FREESTYLE LITE) test strip Use as instructed to test blood sugar twice daily 200 each 3  . hydrochlorothiazide (HYDRODIURIL) 12.5 MG tablet Take 1 tablet (12.5 mg total) by mouth daily. 90 tablet 3  . lisinopril (PRINIVIL,ZESTRIL) 20 MG tablet Take 1 tablet (20 mg total) by mouth daily. 90 tablet 3  . loratadine (CLARITIN) 10 MG tablet Take 1 tablet (10 mg total) by mouth daily. 90 tablet 3  . metFORMIN (GLUCOPHAGE XR) 750 MG 24 hr tablet Take 1 tablet (750 mg total) by mouth daily with breakfast. 90 tablet 3  . methotrexate (RHEUMATREX) 2.5 MG tablet Take 15 mg by mouth once a week.    . montelukast (SINGULAIR) 10 MG tablet Take 1 tablet (10 mg total) by mouth at bedtime. 90 tablet 3  . Multiple Vitamin (MULTIVITAMIN) capsule Take 1 capsule by mouth daily.      . Omega-3 Fatty Acids (FISH  OIL) 1000 MG CAPS Take 1 capsule (1,000 mg total) by mouth daily. 90 capsule 3  . omeprazole (PRILOSEC) 20 MG capsule Take 1 capsule (20 mg total) by mouth daily. 90 capsule 2  . penciclovir (DENAVIR) 1 % cream Apply 1 application topically every 2 (two) hours. 1.5 g 1  . Precision Thin Lancets MISC Use as directed to check sugars twice daily 180 each 3  . simvastatin (ZOCOR) 40 MG tablet Take 1 tablet (40 mg total) by mouth daily. 90 tablet 3  . vitamin E 400 UNIT capsule Take 400 Units by mouth daily.       No current facility-administered medications for this visit.      Allergies:   Patient has no known  allergies.   Social History:  The patient  reports that she has never smoked. She has never used smokeless tobacco. She reports that she drinks about 3.0 oz of alcohol per week . She reports that she does not use drugs.   Family History:   family history includes Cancer in her maternal grandmother; Diabetes in her brother, brother, and sister; Heart Problems in her mother; Stroke in her brother.    Review of Systems: Review of Systems  Constitutional: Negative.   Respiratory: Negative.   Cardiovascular: Negative.   Gastrointestinal: Negative.   Musculoskeletal: Negative.   Neurological: Negative.   Psychiatric/Behavioral: Negative.   All other systems reviewed and are negative.    PHYSICAL EXAM: VS:  BP 138/62 (BP Location: Left Arm, Patient Position: Sitting, Cuff Size: Normal)   Pulse 79   Ht 4\' 11"  (1.499 m)   Wt 139 lb 12 oz (63.4 kg)   BMI 28.23 kg/m  , BMI Body mass index is 28.23 kg/m. GEN: Well nourished, well developed, in no acute distress  HEENT: normal  Neck: no JVD, carotid bruits, or masses Cardiac: RRR; 1+ SEM RSB, no rubs, or gallops,no edema  Respiratory:  clear to auscultation bilaterally, normal work of breathing GI: soft, nontender, nondistended, + BS MS: no deformity or atrophy  Skin: warm and dry, no rash Neuro:  Strength and sensation are intact Psych: euthymic mood, full affect    Recent Labs: 02/04/2015: TSH 1.62 05/04/2015: Hemoglobin 12.4; Platelets 250.0 08/24/2015: ALT 27; BUN 17; Creatinine, Ser 0.67; Potassium 4.4; Sodium 136    Lipid Panel Lab Results  Component Value Date   CHOL 184 08/24/2015   HDL 60.40 08/24/2015   LDLCALC 107 (H) 08/24/2015   TRIG 81.0 08/24/2015      Wt Readings from Last 3 Encounters:  11/19/15 139 lb 12 oz (63.4 kg)  10/21/15 137 lb 8 oz (62.4 kg)  08/26/15 134 lb 12 oz (61.1 kg)       ASSESSMENT AND PLAN:  Aortic atheroma/mobile echodensity Etiology of the finding on her echocardiogram is  unclear I have reviewed the images personally Discussed the findings with Dr. Gerald Stabs end who read the echo Predicament discussed with the patient, unclear what this finding is from. She is willing to proceed with transesophageal echo for further characterization.  Essential hypertension - Plan: EKG 12-Lead Blood pressure is well controlled on today's visit. No changes made to the medications.  Heart murmur, systolic -  Suspect secondary to thickened mitral valve leaflets, no significant stenosis or regurgitation. No further workup at this time. TEE scheduled for above, will look at all valves   Pure hypercholesterolemia Encouraged her to stay on her current dose of simvastatin   Diabetes mellitus without complication ([place]) Dietary guide  discussed with her, low carbohydrates, daily exercise   Total encounter time more than 25 minutes  Greater than 50% was spent in counseling and coordination of care with the patient    No orders of the defined types were placed in this encounter.    Signed, Esmond Plants, M.D., Ph.D. 11/19/2015  [place], [place]

## 2015-11-19 NOTE — Patient Instructions (Addendum)
Medication Instructions:   No medication changes made  Labwork:  No new labs needed  Testing/Procedures: We will schedule a TEE (transesophageal echo) to look at abnormality in the ascending aorta Your physician has requested that you have a TEE. During a TEE, sound waves are used to create images of your heart. It provides your doctor with information about the size and shape of your heart and how well your heart's chambers and valves are working. In this test, a transducer is attached to the end of a flexible tube that's guided down your throat and into your esophagus (the tube leading from you mouth to your stomach) to get a more detailed image of your heart. You are not awake for the procedure. Please see the instruction sheet given to you today. For further information please visit [email or web address].  Please arrive at the [facility] on Tuesday, November 21 @ 6:30 am  Please DO NOT eat or drink anything after midnight Please DO NOT take any of her your med the morning of your procedure   I recommend watching educational videos on topics of interest to you at:       [email or web address]  Enter code: [email or web address]    Follow-Up: It was a pleasure seeing you in the office today. Please call us if you have new issues that need to be addressed before your next appt.  [phone]  Your physician wants you to follow-up in: 12 months.  You will receive a reminder letter in the mail two months in advance. If you don't receive a letter, please call our office to schedule the follow-up appointment.  If you need a refill on your cardiac medications before your next appointment, please call your pharmacy.    Transesophageal Echocardiogram Transesophageal echocardiography (TEE) is a picture test of your heart using sound waves. The pictures taken can give very detailed pictures of your heart. This can help your doctor see if there are problems with your heart. TEE can check:  If your heart has  blood clots in it.  How well your heart valves are working.  If you have an infection on the inside of your heart.  Some of the major arteries of your heart.  If your heart valve is working after a Office manager.  Your heart before a procedure that uses a shock to your heart to get the rhythm back to normal. What happens before the procedure?  Do not eat or drink for 6 hours before the procedure or as told by your doctor.  Make plans to have someone drive you home after the procedure. Do not drive yourself home.  An IV tube will be put in your arm. What happens during the procedure?  You will be given a medicine to help you relax (sedative). It will be given through the IV tube.  A numbing medicine will be sprayed or gargled in the back of your throat to help numb it.  The tip of the probe is placed into the back of your mouth. You will be asked to swallow. This helps to pass the probe into your esophagus.  Once the tip of the probe is in the right place, your doctor can take pictures of your heart.  You may feel pressure at the back of your throat. What happens after the procedure?  You will be taken to a recovery area so the sedative can wear off.  Your throat may be sore and scratchy. This will go away slowly over time.  You will  go home when you are fully awake and able to swallow liquids.  You should have someone stay with you for the next 24 hours.  Do not drive or operate machinery for the next 24 hours. This information is not intended to replace advice given to you by your health care provider. Make sure you discuss any questions you have with your health care provider. Document Released: 10/17/2008 Document Revised: 05/28/2015 Document Reviewed: 06/21/2012 Elsevier Interactive Patient Education  2017 [employer].

## 2015-11-23 ENCOUNTER — Other Ambulatory Visit: Payer: Self-pay | Admitting: Cardiovascular Disease

## 2015-11-23 ENCOUNTER — Telehealth: Payer: Self-pay | Admitting: Cardiovascular Disease

## 2015-11-23 NOTE — Telephone Encounter (Signed)
Spoke w/ pt.  Advised her that Dr. Rockey Situ would like to push her TEE back a bit in the morning and for her to arrive @ the [place] @ 7:30 am for 8:15 procedure. She is agreeable and appreciative of the call.

## 2015-11-24 ENCOUNTER — Other Ambulatory Visit: Payer: Self-pay | Admitting: *Deleted

## 2015-11-24 ENCOUNTER — Encounter: Payer: Self-pay | Admitting: *Deleted

## 2015-11-24 ENCOUNTER — Encounter
Admission: RE | Disposition: A | Discharge status: Home / Self Care | Payer: Self-pay | Source: Ambulatory Visit | Attending: Cardiovascular Disease

## 2015-11-24 ENCOUNTER — Other Ambulatory Visit: Payer: Self-pay | Admitting: Cardiovascular Disease

## 2015-11-24 ENCOUNTER — Ambulatory Visit
Admission: RE | Admit: 2015-11-24 | Discharge: 2015-11-24 | Disposition: A | Discharge status: Home / Self Care | Payer: Medicare Other | Source: Ambulatory Visit | Attending: Cardiovascular Disease | Admitting: Cardiovascular Disease

## 2015-11-24 DIAGNOSIS — Z8249 Family history of ischemic heart disease and other diseases of the circulatory system: Secondary | ICD-10-CM | POA: Insufficient documentation

## 2015-11-24 DIAGNOSIS — E119 Type 2 diabetes mellitus without complications: Secondary | ICD-10-CM | POA: Insufficient documentation

## 2015-11-24 DIAGNOSIS — Z7982 Long term (current) use of aspirin: Secondary | ICD-10-CM | POA: Insufficient documentation

## 2015-11-24 DIAGNOSIS — I700 Atherosclerosis of aorta: Secondary | ICD-10-CM | POA: Diagnosis not present

## 2015-11-24 DIAGNOSIS — Q211 Atrial septal defect: Secondary | ICD-10-CM | POA: Diagnosis not present

## 2015-11-24 DIAGNOSIS — Z9049 Acquired absence of other specified parts of digestive tract: Secondary | ICD-10-CM | POA: Diagnosis not present

## 2015-11-24 DIAGNOSIS — Z7984 Long term (current) use of oral hypoglycemic drugs: Secondary | ICD-10-CM | POA: Diagnosis not present

## 2015-11-24 DIAGNOSIS — I358 Other nonrheumatic aortic valve disorders: Secondary | ICD-10-CM | POA: Insufficient documentation

## 2015-11-24 DIAGNOSIS — I10 Essential (primary) hypertension: Secondary | ICD-10-CM | POA: Insufficient documentation

## 2015-11-24 DIAGNOSIS — I779 Disorder of arteries and arterioles, unspecified: Secondary | ICD-10-CM | POA: Diagnosis not present

## 2015-11-24 DIAGNOSIS — Z79899 Other long term (current) drug therapy: Secondary | ICD-10-CM | POA: Insufficient documentation

## 2015-11-24 DIAGNOSIS — L409 Psoriasis, unspecified: Secondary | ICD-10-CM | POA: Insufficient documentation

## 2015-11-24 DIAGNOSIS — E7800 Pure hypercholesterolemia, unspecified: Secondary | ICD-10-CM | POA: Diagnosis not present

## 2015-11-24 DIAGNOSIS — R011 Cardiac murmur, unspecified: Secondary | ICD-10-CM | POA: Diagnosis not present

## 2015-11-24 HISTORY — PX: TEE WITHOUT CARDIOVERSION: SHX5443

## 2015-11-24 SURGERY — ECHOCARDIOGRAM, TRANSESOPHAGEAL
Anesthesia: Moderate Sedation

## 2015-11-24 MED ORDER — MIDAZOLAM HCL 2 MG/2ML IJ SOLN
INTRAMUSCULAR | Status: AC | PRN
  Administered 2015-11-24 (×3): 1 mg via INTRAVENOUS

## 2015-11-24 MED ORDER — BUTAMBEN-TETRACAINE-BENZOCAINE 2-2-14 % EX AERO
INHALATION_SPRAY | CUTANEOUS | Status: AC
  Filled 2015-11-24: qty 20

## 2015-11-24 MED ORDER — FENTANYL CITRATE (PF) 100 MCG/2ML IJ SOLN
INTRAMUSCULAR | Status: AC
  Filled 2015-11-24: qty 4

## 2015-11-24 MED ORDER — EZETIMIBE 10 MG PO TABS: 10.0000 mg | ORAL_TABLET | Freq: Every day | ORAL | 4 refills | Status: DC

## 2015-11-24 MED ORDER — FENTANYL CITRATE (PF) 100 MCG/2ML IJ SOLN
INTRAMUSCULAR | Status: AC | PRN
  Administered 2015-11-24 (×2): 25 ug via INTRAVENOUS

## 2015-11-24 MED ORDER — LIDOCAINE VISCOUS 2 % MT SOLN
OROMUCOSAL | Status: AC
  Filled 2015-11-24: qty 15

## 2015-11-24 MED ORDER — MIDAZOLAM HCL 5 MG/5ML IJ SOLN
INTRAMUSCULAR | Status: AC
  Filled 2015-11-24: qty 10

## 2015-11-24 MED ORDER — EZETIMIBE 10 MG PO TABS
10.0000 mg | ORAL_TABLET | Freq: Every day | ORAL | 4 refills | Status: DC
Start: 2015-11-24 — End: 2015-11-24

## 2015-11-24 MED ORDER — SODIUM CHLORIDE FLUSH 0.9 % IV SOLN
INTRAVENOUS | Status: AC
  Filled 2015-11-24: qty 10

## 2015-11-24 NOTE — Progress Notes (Signed)
*  PRELIMINARY RESULTS* Echocardiogram Echocardiogram Transesophageal has been performed.  Sherrie Sport 11/24/2015, 8:51 AM

## 2015-11-24 NOTE — Addendum Note (Signed)
Addended by: Valora Corporal on: 11/24/2015 09:40 AM   Modules accepted: Orders

## 2015-11-24 NOTE — Procedures (Addendum)
Transesophageal Echocardiogram :  Indication: aortic disease, mobile opacity noted in the aorta Requesting/ordering  physician: Rockey Situ, tim  Procedure: Benzocaine spray x2 and 2 mls x 2 of viscous lidocaine were given orally to provide local anesthesia to the oropharynx. The patient was positioned supine on the left side, bite block provided. The patient was moderately sedated with the doses of versed and fentanyl as detailed below.  Using digital technique an omniplane probe was advanced into the distal esophagus without incident.   Moderate sedation: 1. Sedation used:  Versed: 3 mg , Fentanyl: 75 ug  45 min 3. I was face to face during this time  See report in EPIC  for complete details: In brief, transgastric imaging revealed normal LV function with no RWMAs and no mural apical thrombus.  .  Estimated ejection fraction was 60%.  Right sided cardiac chambers were normal with no evidence of pulmonary hypertension.  Imaging of the septum showed no ASD or VSD Bubble study was positive for shunt 2D and color flow confirmed  PFO  The LA was well visualized in orthogonal views.  There was no spontaneous contrast and no thrombus in the LA and LA appendage   The descending thoracic aorta had no  mural aortic debris with no evidence of aneurysmal dilation or disection There is mild to moderate ascending, aortic root and descending aorta atherosclerosis Area of interest in the aortic root shows moderate atherosclerosis  Ida Rogue 11/24/2015 1:47 PM

## 2015-11-30 ENCOUNTER — Encounter: Payer: Self-pay | Admitting: Cardiovascular Disease

## 2015-12-01 ENCOUNTER — Telehealth: Payer: Self-pay | Admitting: Cardiovascular Disease

## 2015-12-01 MED ORDER — EZETIMIBE 10 MG PO TABS: 10.0000 mg | ORAL_TABLET | Freq: Every day | ORAL | 4 refills | Status: DC

## 2015-12-01 NOTE — Telephone Encounter (Signed)
Spoke w/ pt.  She reports that the written rx for Zetia states DAW, so she is unsure if she can p/u generic. Printed rx and it reads "Dispense as written: No" Advised her that she should have no problem picking up generic ezetimibe. Asked her to call back w/ any questions or concerns.

## 2015-12-01 NOTE — Telephone Encounter (Signed)
Pt has a question regarding her Zetia.

## 2015-12-09 ENCOUNTER — Encounter: Payer: Self-pay | Admitting: Internal Medicine

## 2015-12-10 MED ORDER — SIMVASTATIN 40 MG PO TABS: 40.0000 mg | ORAL_TABLET | Freq: Every day | ORAL | 3 refills | Status: DC

## 2015-12-10 MED ORDER — HYDROCHLOROTHIAZIDE 12.5 MG PO TABS: 12.5000 mg | ORAL_TABLET | Freq: Every day | ORAL | 3 refills | Status: DC

## 2015-12-23 ENCOUNTER — Other Ambulatory Visit (INDEPENDENT_AMBULATORY_CARE_PROVIDER_SITE_OTHER): Payer: Medicare Other

## 2015-12-23 DIAGNOSIS — E119 Type 2 diabetes mellitus without complications: Secondary | ICD-10-CM | POA: Diagnosis not present

## 2015-12-23 DIAGNOSIS — E785 Hyperlipidemia, unspecified: Secondary | ICD-10-CM

## 2015-12-23 LAB — COMPREHENSIVE METABOLIC PANEL
ALBUMIN: 4.5 g/dL (ref 3.5–5.2)
ALK PHOS: 73 U/L (ref 39–117)
ALT: 30 U/L (ref 0–35)
AST: 25 U/L (ref 0–37)
BILIRUBIN TOTAL: 0.6 mg/dL (ref 0.2–1.2)
BUN: 17 mg/dL (ref 6–23)
CALCIUM: 9.3 mg/dL (ref 8.4–10.5)
CHLORIDE: 102 meq/L (ref 96–112)
CO2: 28 mEq/L (ref 19–32)
CREATININE: 0.7 mg/dL (ref 0.40–1.20)
GFR: 87.64 mL/min (ref >60.00)
Glucose, Bld: 148 mg/dL — ABNORMAL HIGH (ref 70–99)
Potassium: 4.9 mEq/L (ref 3.5–5.1)
SODIUM: 140 meq/L (ref 135–145)
TOTAL PROTEIN: 7.1 g/dL (ref 6.0–8.3)

## 2015-12-23 LAB — LIPID PANEL
CHOL/HDL RATIO: 3
Cholesterol: 153 mg/dL (ref 0–200)
HDL: 58.6 mg/dL (ref >39.00)
LDL CALC: 73 mg/dL (ref 0–99)
NONHDL: 93.9
TRIGLYCERIDES: 104 mg/dL (ref 0.0–149.0)
VLDL: 20.8 mg/dL (ref 0.0–40.0)

## 2015-12-23 LAB — MICROALBUMIN / CREATININE URINE RATIO
Creatinine,U: 151.2 mg/dL
MICROALB UR: 1.9 mg/dL (ref 0.0–1.9)
Microalb Creat Ratio: 1.3 mg/g (ref 0.0–30.0)

## 2015-12-23 LAB — HEMOGLOBIN A1C: Hgb A1c MFr Bld: 7.5 % — ABNORMAL HIGH (ref 4.6–6.5)

## 2015-12-25 ENCOUNTER — Encounter: Payer: Self-pay | Admitting: Internal Medicine

## 2015-12-25 ENCOUNTER — Other Ambulatory Visit: Payer: Medicare Other

## 2015-12-27 ENCOUNTER — Encounter: Payer: Self-pay | Admitting: Internal Medicine

## 2015-12-30 ENCOUNTER — Telehealth: Payer: Self-pay | Admitting: Internal Medicine

## 2015-12-30 ENCOUNTER — Ambulatory Visit (INDEPENDENT_AMBULATORY_CARE_PROVIDER_SITE_OTHER): Payer: Medicare Other | Admitting: Internal Medicine

## 2015-12-30 ENCOUNTER — Encounter: Payer: Self-pay | Admitting: Internal Medicine

## 2015-12-30 DIAGNOSIS — Q2112 Patent foramen ovale: Secondary | ICD-10-CM

## 2015-12-30 DIAGNOSIS — Q211 Atrial septal defect: Secondary | ICD-10-CM | POA: Diagnosis not present

## 2015-12-30 DIAGNOSIS — E7800 Pure hypercholesterolemia, unspecified: Secondary | ICD-10-CM | POA: Diagnosis not present

## 2015-12-30 DIAGNOSIS — E119 Type 2 diabetes mellitus without complications: Secondary | ICD-10-CM | POA: Diagnosis not present

## 2015-12-30 DIAGNOSIS — I10 Essential (primary) hypertension: Secondary | ICD-10-CM | POA: Diagnosis not present

## 2015-12-30 DIAGNOSIS — E785 Hyperlipidemia, unspecified: Secondary | ICD-10-CM

## 2015-12-30 MED ORDER — LISINOPRIL-HYDROCHLOROTHIAZIDE 20-25 MG PO TABS: 1.0000 | ORAL_TABLET | Freq: Every day | ORAL | 3 refills | Status: DC

## 2015-12-30 NOTE — Telephone Encounter (Signed)
LABS  Ordered for April

## 2015-12-30 NOTE — Telephone Encounter (Signed)
Pt needs orders for fasting labs previous to her appt on 04/27/16. Please advise, thank you!

## 2015-12-30 NOTE — Progress Notes (Signed)
Subjective:  Patient ID: Emily Ryan, female    DOB: 03-28-1945  Age: 70 y.o. MRN: [id number]  CC: Diagnoses of Patent foramen ovale with right to left shunt, Diabetes mellitus without complication ([place]), Pure hypercholesterolemia, and Essential hypertension were pertinent to this visit.  HPI Emily Ryan presents for diabetes follow up   Since her last visit she underwent  TEE for evaluation  Of a murmur noted on last exam.  She was noted to have a PFO via  positive bubble  study.  Aortic calcifictions were also noted, and she was advised by Dr. Rockey Situ to start taking zetia , which she has been tolerating for 3 weeks.   taking mtx for psoriasis   [place],  gradualy reduced dose       Outpatient Medications Prior to Visit  Medication Sig Dispense Refill  . aspirin 81 MG chewable tablet Chew 81 mg by mouth daily.     . Calcium Carb-Cholecalciferol 600-100 MG-UNIT CAPS Take 1 tablet by mouth daily. Two by mouth daily (Patient taking differently: Take 1 tablet by mouth daily. ) 90 capsule 3  . escitalopram (LEXAPRO) 5 MG tablet Take 1 tablet (5 mg total) by mouth daily. 90 tablet 2  . ezetimibe (ZETIA) 10 MG tablet Take 1 tablet (10 mg total) by mouth daily. 90 tablet 4  . folic acid (FOLVITE) 1 MG tablet Take 1 mg by mouth daily.    Marland Kitchen glipiZIDE (GLUCOTROL) 5 MG tablet Take 0.5 tablets (2.5 mg total) by mouth 2 (two) times daily before a meal. 90 tablet 3  . glucose blood (FREESTYLE LITE) test strip Use as instructed to test blood sugar twice daily 200 each 3  . hydrochlorothiazide (HYDRODIURIL) 12.5 MG tablet Take 1 tablet (12.5 mg total) by mouth daily. 90 tablet 3  . lisinopril (PRINIVIL,ZESTRIL) 20 MG tablet Take 1 tablet (20 mg total) by mouth daily. 90 tablet 3  . loratadine (CLARITIN) 10 MG tablet Take 1 tablet (10 mg total) by mouth daily. 90 tablet 3  . metFORMIN (GLUCOPHAGE XR) 750 MG 24 hr tablet Take 1 tablet (750 mg total) by mouth daily with breakfast. 90 tablet 3   . methotrexate (RHEUMATREX) 2.5 MG tablet Take 7.5 mg by mouth every Saturday.     . montelukast (SINGULAIR) 10 MG tablet Take 1 tablet (10 mg total) by mouth at bedtime. 90 tablet 3  . Multiple Vitamin (MULTIVITAMIN) capsule Take 1 capsule by mouth daily.      . Omega-3 Fatty Acids (FISH OIL) 1000 MG CAPS Take 1 capsule (1,000 mg total) by mouth daily. 90 capsule 3  . omeprazole (PRILOSEC) 20 MG capsule Take 1 capsule (20 mg total) by mouth daily. 90 capsule 2  . penciclovir (DENAVIR) 1 % cream Apply 1 application topically every 2 (two) hours. (Patient taking differently: Apply 1 application topically every 2 (two) hours as needed (for cold sores/fever blisters). ) 1.5 g 1  . Precision Thin Lancets MISC Use as directed to check sugars twice daily 180 each 3  . simvastatin (ZOCOR) 40 MG tablet Take 1 tablet (40 mg total) by mouth daily. 90 tablet 3  . vitamin E 400 UNIT capsule Take 400 Units by mouth daily.       No facility-administered medications prior to visit.     Review of Systems;  Patient denies headache, fevers, malaise, unintentional weight loss, skin rash, eye pain, sinus congestion and sinus pain, sore throat, dysphagia,  hemoptysis , cough, dyspnea, wheezing, chest pain,  palpitations, orthopnea, edema, abdominal pain, nausea, melena, diarrhea, constipation, flank pain, dysuria, hematuria, urinary  Frequency, nocturia, numbness, tingling, seizures,  Focal weakness, Loss of consciousness,  Tremor, insomnia, depression, anxiety, and suicidal ideation.      Objective:  BP (!) 142/80   Pulse 71   Temp 98.1 F (36.7 C) (Oral)   Resp 16   Ht 4\' 11"  (1.499 m)   Wt 137 lb (62.1 kg)   SpO2 94%   BMI 27.67 kg/m   BP Readings from Last 3 Encounters:  12/30/15 (!) 142/80  11/24/15 134/65  11/19/15 138/62    Wt Readings from Last 3 Encounters:  12/30/15 137 lb (62.1 kg)  11/24/15 139 lb (63 kg)  11/19/15 139 lb 12 oz (63.4 kg)    General appearance: alert, cooperative  and appears stated age Ears: normal TM's and external ear canals both ears Throat: lips, mucosa, and tongue normal; teeth and gums normal Neck: no adenopathy, no carotid bruit, supple, symmetrical, trachea midline and thyroid not enlarged, symmetric, no tenderness/mass/nodules Back: symmetric, no curvature. ROM normal. No CVA tenderness. Lungs: clear to auscultation bilaterally Heart: regular rate and rhythm, S1, S2 normal, no murmur, click, rub or gallop Abdomen: soft, non-tender; bowel sounds normal; no masses,  no organomegaly Pulses: 2+ and symmetric Skin: Skin color, texture, turgor normal. No rashes or lesions Lymph nodes: Cervical, supraclavicular, and axillary nodes normal.  Lab Results  Component Value Date   HGBA1C 7.5 (H) 12/23/2015   HGBA1C 6.9 (H) 08/24/2015   HGBA1C 7.5 (H) 05/04/2015    Lab Results  Component Value Date   CREATININE 0.70 12/23/2015   CREATININE 0.67 08/24/2015   CREATININE 0.71 05/04/2015    Lab Results  Component Value Date   WBC 5.1 05/04/2015   HGB 12.4 05/04/2015   HCT 37.0 05/04/2015   PLT 250.0 05/04/2015   GLUCOSE 148 (H) 12/23/2015   CHOL 153 12/23/2015   TRIG 104.0 12/23/2015   HDL 58.60 12/23/2015   LDLDIRECT 104.0 05/04/2015   LDLCALC 73 12/23/2015   ALT 30 12/23/2015   AST 25 12/23/2015   NA 140 12/23/2015   K 4.9 12/23/2015   CL 102 12/23/2015   CREATININE 0.70 12/23/2015   BUN 17 12/23/2015   CO2 28 12/23/2015   TSH 1.62 02/04/2015   HGBA1C 7.5 (H) 12/23/2015   MICROALBUR 1.9 12/23/2015    No results found.  Assessment & Plan:   Problem List Items Addressed This Visit    Diabetes mellitus without complication (HCC)    Slight loss  Of control,  Current glipizide dose is 2.5 mg  Twice daily,  Unless she exercises in the morning. On those days she suspends the morning dose due to recurrent hypoglycemia with therapy  .  Diet reviewed,  The holiday diet has been the culprit, no changes today to regimen.   Lab Results    Component Value Date   HGBA1C 7.5 (H) 12/23/2015   Lab Results  Component Value Date   MICROALBUR 1.9 12/23/2015          Relevant Medications   lisinopril-hydrochlorothiazide (PRINZIDE,ZESTORETIC) 20-25 MG tablet   Essential hypertension    Not at goal,  Advised to increase  hctz to 25 mg daily       Relevant Medications   lisinopril-hydrochlorothiazide (PRINZIDE,ZESTORETIC) 20-25 MG tablet   Hyperlipidemia    Now managed with simvastatin and zetia per dr Rockey Situ  Given findings of aortic calcifications      Relevant Medications   lisinopril-hydrochlorothiazide (  PRINZIDE,ZESTORETIC) 20-25 MG tablet   Patent foramen ovale with right to left shunt    Found incidentally on TEE with agitated saline study  Nov 2017.  No treatment planned since she is asymptomatic.       Relevant Medications   lisinopril-hydrochlorothiazide (PRINZIDE,ZESTORETIC) 20-25 MG tablet      I am having Ms. Reichard maintain her vitamin E, multivitamin, methotrexate, PRECISION THIN LANCETS, folic acid, Fish Oil, Calcium Carb-Cholecalciferol, escitalopram, glucose blood, omeprazole, glipiZIDE, lisinopril, loratadine, montelukast, penciclovir, metFORMIN, aspirin, ezetimibe, hydrochlorothiazide, simvastatin, and lisinopril-hydrochlorothiazide.  Meds ordered this encounter  Medications  . DISCONTD: lisinopril-hydrochlorothiazide (PRINZIDE,ZESTORETIC) 20-25 MG tablet    Sig: Take 1 tablet by mouth daily.    Dispense:  90 tablet    Refill:  3  . lisinopril-hydrochlorothiazide (PRINZIDE,ZESTORETIC) 20-25 MG tablet    Sig: Take 1 tablet by mouth daily.    Dispense:  90 tablet    Refill:  3    Medications Discontinued During This Encounter  Medication Reason  . lisinopril-hydrochlorothiazide (PRINZIDE,ZESTORETIC) 20-25 MG tablet Reorder    Follow-up: Return in about 3 months (around 03/29/2016) for follow up diabetes.   Crecencio Mc, MD

## 2015-12-30 NOTE — Patient Instructions (Addendum)
Increase the hctz to 25 mg daily,  Continue lisinopril 20 mg daily  The [facility],    The alternative  is to keep the hctz at 12.5 mg and increase the lisinopril  to 40 mg    Check your blood sugars once daily , if your  fastings are > 130 ,  increase evening dose of glipizide to a `full tablet

## 2015-12-31 NOTE — Progress Notes (Signed)
Pre-visit discussion using our clinic review tool. No additional management support is needed unless otherwise documented below in the visit note.  

## 2016-01-02 NOTE — Assessment & Plan Note (Signed)
Not at goal,  Advised to increase  hctz to 25 mg daily

## 2016-01-02 NOTE — Assessment & Plan Note (Signed)
Found incidentally on TEE with agitated saline study  Nov 2017.  No treatment planned since she is asymptomatic.

## 2016-01-02 NOTE — Assessment & Plan Note (Signed)
Now managed with simvastatin and zetia per dr Rockey Situ  Given findings of aortic calcifications

## 2016-01-02 NOTE — Assessment & Plan Note (Addendum)
Slight loss  Of control,  Current glipizide dose is 2.5 mg  Twice daily,  Unless she exercises in the morning. On those days she suspends the morning dose due to recurrent hypoglycemia with therapy  .  Diet reviewed,  The holiday diet has been the culprit, no changes today to regimen.   Lab Results  Component Value Date   HGBA1C 7.5 (H) 12/23/2015   Lab Results  Component Value Date   MICROALBUR 1.9 12/23/2015

## 2016-01-14 ENCOUNTER — Encounter: Payer: Self-pay | Admitting: Internal Medicine

## 2016-01-31 ENCOUNTER — Encounter: Payer: Self-pay | Admitting: Internal Medicine

## 2016-02-01 ENCOUNTER — Encounter: Payer: Self-pay | Admitting: Pulmonary Disease

## 2016-02-01 ENCOUNTER — Ambulatory Visit: Payer: Medicare Other | Admitting: Family

## 2016-02-01 ENCOUNTER — Ambulatory Visit (INDEPENDENT_AMBULATORY_CARE_PROVIDER_SITE_OTHER): Payer: Medicare Other | Admitting: Pulmonary Disease

## 2016-02-01 DIAGNOSIS — J208 Acute bronchitis due to other specified organisms: Secondary | ICD-10-CM

## 2016-02-01 MED ORDER — BENZONATATE 200 MG PO CAPS: 200.0000 mg | ORAL_CAPSULE | Freq: Three times a day (TID) | ORAL | 0 refills | Status: DC | PRN

## 2016-02-01 NOTE — Progress Notes (Signed)
Subjective:    Patient ID: Emily Ryan, female    DOB: May 16, 1945, 71 y.o.   MRN: [id number]  Synopsis: Ms. Mirabito first saw the [facility] in 06/2012 for evaluation of annual bronchitis, allergic rhinitis and possible asthma.  Simple spirometry was normal. She did well with step down therapy from Symbicort to Asmanex. She had a methacholine challenge which was normal in July 2014.   HPI  Chief Complaint  Patient presents with  . Acute Visit    last seen 11/2014- pt c/o increased chest tightness, SOB, wheezing.  s/s started while on a cruise last week- currently on cipro prescribed by PCP.     Emily Ryan formerly saw me in 2015 and is here to see me for ongoing cough and mucus production.  She says that a week ago she went on a cruise and she originally she felt OK but on day three she had sinus congestion with cough.  She has had symptoms now for 6 days.  She has been taking CIPRO for 5 days.  She is bringing up mucus, but she wasn't doing that prior.  She had chest pain to start with, but none now.  She had some chills, but never measured a fever.  She may have had some mild dyspnea when climbing stairs.  She now feels a little better, but not much improvement.    In addition to Cipro she is taking Vicks 44 cough syrup and cough drops.  She has taken tylenol.  She took a little Vicks last night.   Past Medical History:  Diagnosis Date  . Complicated pregnancy    1st pregnancy complicated by post operative hemorrhage and 2ng complicated by epidural  . Diabetes mellitus   . Diverticulosis of colon (without mention of hemorrhage)   . [place] psoriasis   . Hyperlipidemia   . Hypertension   . Menopausal disorder      Review of Systems     Objective:   Physical Exam  Vitals:   02/01/16 1457  BP: 126/66  BP Location: Right Arm  Cuff Size: Normal  Weight: 135 lb (61.2 kg)  Height: 4\' 11"  (1.499 m)  RA  Gen: no acute distress HENT: OP clear, TM's clear,  neck supple PULM: Late expiratory wheeze which is upper airway in nature, normal percussion CV: RRR, no mgr, trace edema GI: BS+, soft, nontender Derm: no cyanosis or rash Psyche: normal mood and affect   PFTS done at [facility] May 2014    FVC 2.44L  105% predFEV 1 1.96L (119% predicted) FEV1/FVC : 80 % pred DLCO 111% pred TLC 4.12L  102%  RV 1.66L  112%   06/2012 ACQ (Asmanex) 0/7  07/2012 spiro PFT> ratio 82%, FEV 1 1.98L (105% pred), no change with BD 07/2012 methacholine challenge> no change with ay dose. Negative study      Assessment & Plan:   Viral bronchitis Chealsey has a bad case of a viral bronchitis which is causing significant cough. It's exacerbated by sinus congestion contributing to the severity of her cough.  As noted previously she does not have asthma.  Plan: Treat cough with Tessalon when necessary, voice rest as able Finish course of Cipro provided by primary care physician Treat sinus congestion with Milta Deiters med rinses as well as over-the-counter Sudafed Return to clinic if no improvement after 7 days or if symptoms worsen.   Updated Medication List Outpatient Encounter Prescriptions as of 02/01/2016  Medication Sig Dispense Refill  . aspirin 81  MG chewable tablet Chew 81 mg by mouth daily.     . Calcium Carb-Cholecalciferol 600-100 MG-UNIT CAPS Take 1 tablet by mouth daily. Two by mouth daily (Patient taking differently: Take 1 tablet by mouth daily. ) 90 capsule 3  . ciprofloxacin (CIPRO) 500 MG tablet Take 500 mg by mouth 2 (two) times daily.    Marland Kitchen escitalopram (LEXAPRO) 5 MG tablet Take 1 tablet (5 mg total) by mouth daily. 90 tablet 2  . ezetimibe (ZETIA) 10 MG tablet Take 1 tablet (10 mg total) by mouth daily. 90 tablet 4  . folic acid (FOLVITE) 1 MG tablet Take 1 mg by mouth daily.    Marland Kitchen glipiZIDE (GLUCOTROL) 5 MG tablet Take 0.5 tablets (2.5 mg total) by mouth 2 (two) times daily before a meal. 90 tablet 3  . glucose blood (FREESTYLE LITE) test strip Use as  instructed to test blood sugar twice daily 200 each 3  . hydrochlorothiazide (HYDRODIURIL) 12.5 MG tablet Take 1 tablet (12.5 mg total) by mouth daily. 90 tablet 3  . lisinopril (PRINIVIL,ZESTRIL) 20 MG tablet Take 1 tablet (20 mg total) by mouth daily. 90 tablet 3  . loratadine (CLARITIN) 10 MG tablet Take 1 tablet (10 mg total) by mouth daily. 90 tablet 3  . metFORMIN (GLUCOPHAGE XR) 750 MG 24 hr tablet Take 1 tablet (750 mg total) by mouth daily with breakfast. 90 tablet 3  . methotrexate (RHEUMATREX) 2.5 MG tablet Take 7.5 mg by mouth every Saturday.     . montelukast (SINGULAIR) 10 MG tablet Take 1 tablet (10 mg total) by mouth at bedtime. 90 tablet 3  . Multiple Vitamin (MULTIVITAMIN) capsule Take 1 capsule by mouth daily.      . Omega-3 Fatty Acids (FISH OIL) 1000 MG CAPS Take 1 capsule (1,000 mg total) by mouth daily. 90 capsule 3  . omeprazole (PRILOSEC) 20 MG capsule Take 1 capsule (20 mg total) by mouth daily. 90 capsule 2  . Precision Thin Lancets MISC Use as directed to check sugars twice daily 180 each 3  . simvastatin (ZOCOR) 40 MG tablet Take 1 tablet (40 mg total) by mouth daily. 90 tablet 3  . vitamin E 400 UNIT capsule Take 400 Units by mouth daily.      . benzonatate (TESSALON) 200 MG capsule Take 1 capsule (200 mg total) by mouth 3 (three) times daily as needed for cough. 45 capsule 0  . lisinopril-hydrochlorothiazide (PRINZIDE,ZESTORETIC) 20-25 MG tablet Take 1 tablet by mouth daily. (Patient not taking: Reported on 02/01/2016) 90 tablet 3  . penciclovir (DENAVIR) 1 % cream Apply 1 application topically every 2 (two) hours. (Patient not taking: Reported on 02/01/2016) 1.5 g 1   No facility-administered encounter medications on file as of 02/01/2016.

## 2016-02-01 NOTE — Assessment & Plan Note (Signed)
Emily Ryan has a bad case of a viral bronchitis which is causing significant cough. It's exacerbated by sinus congestion contributing to the severity of her cough.  As noted previously she does not have asthma.  Plan: Treat cough with Tessalon when necessary, voice rest as able Finish course of Cipro provided by primary care physician Treat sinus congestion with Milta Deiters med rinses as well as over-the-counter Sudafed Return to clinic if no improvement after 7 days or if symptoms worsen.

## 2016-02-01 NOTE — Patient Instructions (Signed)
Use Neil Med rinses with distilled water at least twice per day using the instructions on the package. Take a decongestant with pseudoephedrine for the sinus congestion Use generic zyrtec (cetirizine) every day.  If this doesn't help, then stop taking it and use chlorpheniramine-phenylephrine combination tablets.  Take cipro for another 2 days then stop  Take the tessalon as needed for cough  If your symptoms don't improve over the next 5-7 days or if you worsen, let us know

## 2016-02-02 NOTE — Telephone Encounter (Signed)
FYI

## 2016-02-11 DIAGNOSIS — Z85828 Personal history of other malignant neoplasm of skin: Secondary | ICD-10-CM | POA: Diagnosis not present

## 2016-02-11 DIAGNOSIS — Z872 Personal history of diseases of the skin and subcutaneous tissue: Secondary | ICD-10-CM | POA: Diagnosis not present

## 2016-02-11 DIAGNOSIS — L400 Psoriasis vulgaris: Secondary | ICD-10-CM | POA: Diagnosis not present

## 2016-02-11 DIAGNOSIS — L821 Other seborrheic keratosis: Secondary | ICD-10-CM | POA: Diagnosis not present

## 2016-03-08 DIAGNOSIS — L400 Psoriasis vulgaris: Secondary | ICD-10-CM | POA: Diagnosis not present

## 2016-03-08 DIAGNOSIS — Z5181 Encounter for therapeutic drug level monitoring: Secondary | ICD-10-CM | POA: Diagnosis not present

## 2016-03-31 ENCOUNTER — Other Ambulatory Visit: Payer: Self-pay

## 2016-03-31 DIAGNOSIS — Z8249 Family history of ischemic heart disease and other diseases of the circulatory system: Secondary | ICD-10-CM

## 2016-04-04 ENCOUNTER — Encounter: Payer: Self-pay | Admitting: Internal Medicine

## 2016-04-06 MED ORDER — ESCITALOPRAM OXALATE 5 MG PO TABS: 5.0000 mg | ORAL_TABLET | Freq: Every day | ORAL | 3 refills | Status: DC

## 2016-04-06 NOTE — Telephone Encounter (Signed)
PLEASE PRINT LEXAPRO REFILL FOR PATIENT PICKUP

## 2016-04-07 ENCOUNTER — Ambulatory Visit (INDEPENDENT_AMBULATORY_CARE_PROVIDER_SITE_OTHER)
Admission: RE | Admit: 2016-04-07 | Discharge: 2016-04-07 | Disposition: A | Discharge status: Home / Self Care | Payer: Self-pay | Source: Ambulatory Visit | Attending: Cardiovascular Disease | Admitting: Cardiovascular Disease

## 2016-04-07 DIAGNOSIS — Z8249 Family history of ischemic heart disease and other diseases of the circulatory system: Secondary | ICD-10-CM

## 2016-04-07 MED ORDER — ESCITALOPRAM OXALATE 5 MG PO TABS: 5.0000 mg | ORAL_TABLET | Freq: Every day | ORAL | 3 refills | Status: DC

## 2016-04-07 NOTE — Telephone Encounter (Signed)
Printed, signed and pt has picked up rx.

## 2016-04-25 ENCOUNTER — Other Ambulatory Visit: Payer: Medicare Other

## 2016-04-27 ENCOUNTER — Ambulatory Visit: Payer: Medicare Other | Admitting: Internal Medicine

## 2016-04-29 ENCOUNTER — Telehealth: Payer: Self-pay | Admitting: *Deleted

## 2016-04-29 DIAGNOSIS — I10 Essential (primary) hypertension: Secondary | ICD-10-CM

## 2016-04-29 DIAGNOSIS — D72829 Elevated white blood cell count, unspecified: Secondary | ICD-10-CM

## 2016-04-29 DIAGNOSIS — E119 Type 2 diabetes mellitus without complications: Secondary | ICD-10-CM

## 2016-04-29 NOTE — Telephone Encounter (Signed)
Pt has appt on Monday 05/02/16. Needs "future" lab orders placed.

## 2016-05-01 NOTE — Telephone Encounter (Signed)
Labs have been ordered since dec 27  Let me know if you Can you not see them?  cmet lipid and a1c

## 2016-05-02 ENCOUNTER — Other Ambulatory Visit (INDEPENDENT_AMBULATORY_CARE_PROVIDER_SITE_OTHER): Payer: Medicare Other

## 2016-05-02 DIAGNOSIS — E119 Type 2 diabetes mellitus without complications: Secondary | ICD-10-CM

## 2016-05-02 DIAGNOSIS — E785 Hyperlipidemia, unspecified: Secondary | ICD-10-CM | POA: Diagnosis not present

## 2016-05-02 DIAGNOSIS — I10 Essential (primary) hypertension: Secondary | ICD-10-CM | POA: Diagnosis not present

## 2016-05-02 LAB — COMPREHENSIVE METABOLIC PANEL
ALT: 29 U/L (ref 0–35)
AST: 25 U/L (ref 0–37)
Albumin: 4.1 g/dL (ref 3.5–5.2)
Alkaline Phosphatase: 67 U/L (ref 39–117)
BUN: 19 mg/dL (ref 6–23)
CO2: 30 meq/L (ref 19–32)
Calcium: 9.2 mg/dL (ref 8.4–10.5)
Chloride: 101 mEq/L (ref 96–112)
Creatinine, Ser: 0.72 mg/dL (ref 0.40–1.20)
GFR: 84.75 mL/min (ref >60.00)
GLUCOSE: 166 mg/dL — AB (ref 70–99)
Potassium: 4.6 mEq/L (ref 3.5–5.1)
Sodium: 137 mEq/L (ref 135–145)
Total Bilirubin: 0.6 mg/dL (ref 0.2–1.2)
Total Protein: 6.8 g/dL (ref 6.0–8.3)

## 2016-05-02 LAB — HEMOGLOBIN A1C: HEMOGLOBIN A1C: 8 % — AB (ref 4.6–6.5)

## 2016-05-02 LAB — LIPID PANEL
CHOL/HDL RATIO: 3
Cholesterol: 163 mg/dL (ref 0–200)
HDL: 53.6 mg/dL (ref >39.00)
LDL CALC: 83 mg/dL (ref 0–99)
NONHDL: 109.7
Triglycerides: 136 mg/dL (ref 0.0–149.0)
VLDL: 27.2 mg/dL (ref 0.0–40.0)

## 2016-05-02 NOTE — Telephone Encounter (Signed)
Not able to release the labs for some reason. They are not visible on the lab side. I will just reorder them when patient arrives this morning.

## 2016-05-03 ENCOUNTER — Encounter: Payer: Self-pay | Admitting: Cardiovascular Disease

## 2016-05-04 ENCOUNTER — Encounter: Payer: Self-pay | Admitting: Internal Medicine

## 2016-05-04 ENCOUNTER — Ambulatory Visit (INDEPENDENT_AMBULATORY_CARE_PROVIDER_SITE_OTHER): Payer: Medicare Other | Admitting: Internal Medicine

## 2016-05-04 VITALS — BP 132/64 | HR 84 | Temp 98.0°F | Resp 16 | Ht 59.0 in | Wt 137.0 lb

## 2016-05-04 DIAGNOSIS — Z23 Encounter for immunization: Secondary | ICD-10-CM | POA: Diagnosis not present

## 2016-05-04 DIAGNOSIS — E663 Overweight: Secondary | ICD-10-CM | POA: Diagnosis not present

## 2016-05-04 DIAGNOSIS — E7800 Pure hypercholesterolemia, unspecified: Secondary | ICD-10-CM

## 2016-05-04 DIAGNOSIS — Z1231 Encounter for screening mammogram for malignant neoplasm of breast: Secondary | ICD-10-CM | POA: Diagnosis not present

## 2016-05-04 DIAGNOSIS — E119 Type 2 diabetes mellitus without complications: Secondary | ICD-10-CM | POA: Diagnosis not present

## 2016-05-04 DIAGNOSIS — Z1239 Encounter for other screening for malignant neoplasm of breast: Secondary | ICD-10-CM

## 2016-05-04 HISTORY — DX: Overweight: E66.3

## 2016-05-04 NOTE — Patient Instructions (Addendum)
Your A1c has risen from 7.5 to 8.0  But yoru other labs are fine.   Change the glipizide to 1/2 tablet before lunch and 1/2 tablet before dinner.  You can increase the dinnertime  dose to full tablet after 2 weeks if still running high at night     The new goals for optimal blood pressure management are 120/70.  Please check your blood pressure a few times at home and send me the readings so I can determine if you need a change in medication   I'll see you in early august after labs.

## 2016-05-04 NOTE — Progress Notes (Signed)
Pre visit review using our clinic review tool, if applicable. No additional management support is needed unless otherwise documented below in the visit note. 

## 2016-05-04 NOTE — Progress Notes (Signed)
Subjective:  Patient ID: Emily Ryan, female    DOB: Oct 20, 1945  Age: 71 y.o. MRN: [id number]  CC: The primary encounter diagnosis was Need for pneumococcal vaccination. Diagnoses of Screening breast examination, Breast cancer screening, Diabetes mellitus without complication ([place]), Pure hypercholesterolemia, and Overweight (BMI 25.0-29.9) were also pertinent to this visit.  HPI Emily Ryan presents for 5 month follow up on diabetes.  Patient has no complaints today.  Patient is following a low glycemic index diet and taking all prescribed medications regularly without side effects.  Fasting sugars have been under less than 140 most of the time and post prandials have been under 180 except on rare occasions. Patient is exercising about 4 times per week and intentionally trying to lose weight .  Patient has had an eye exam in the last 12 months and checks feet regularly for signs of infection.  Patient does not walk barefoot outside,  And denies an numbness tingling or burning in feet. Patient is up to date on all recommended vaccinations  Lab Results  Component Value Date   HGBA1C 8.0 (H) 05/02/2016     Outpatient Medications Prior to Visit  Medication Sig Dispense Refill  . aspirin 81 MG chewable tablet Chew 81 mg by mouth daily.     . Calcium Carb-Cholecalciferol 600-100 MG-UNIT CAPS Take 1 tablet by mouth daily. Two by mouth daily (Patient taking differently: Take 1 tablet by mouth daily. ) 90 capsule 3  . escitalopram (LEXAPRO) 5 MG tablet Take 1 tablet (5 mg total) by mouth daily. 90 tablet 3  . ezetimibe (ZETIA) 10 MG tablet Take 1 tablet (10 mg total) by mouth daily. 90 tablet 4  . folic acid (FOLVITE) 1 MG tablet Take 1 mg by mouth daily.    Marland Kitchen glipiZIDE (GLUCOTROL) 5 MG tablet Take 0.5 tablets (2.5 mg total) by mouth 2 (two) times daily before a meal. 90 tablet 3  . glucose blood (FREESTYLE LITE) test strip Use as instructed to test blood sugar twice daily 200 each 3  .  hydrochlorothiazide (HYDRODIURIL) 12.5 MG tablet Take 1 tablet (12.5 mg total) by mouth daily. 90 tablet 3  . lisinopril (PRINIVIL,ZESTRIL) 20 MG tablet Take 1 tablet (20 mg total) by mouth daily. 90 tablet 3  . loratadine (CLARITIN) 10 MG tablet Take 1 tablet (10 mg total) by mouth daily. 90 tablet 3  . metFORMIN (GLUCOPHAGE XR) 750 MG 24 hr tablet Take 1 tablet (750 mg total) by mouth daily with breakfast. 90 tablet 3  . methotrexate (RHEUMATREX) 2.5 MG tablet Take 7.5 mg by mouth every Saturday.     . montelukast (SINGULAIR) 10 MG tablet Take 1 tablet (10 mg total) by mouth at bedtime. 90 tablet 3  . Multiple Vitamin (MULTIVITAMIN) capsule Take 1 capsule by mouth daily.      . Omega-3 Fatty Acids (FISH OIL) 1000 MG CAPS Take 1 capsule (1,000 mg total) by mouth daily. 90 capsule 3  . omeprazole (PRILOSEC) 20 MG capsule Take 1 capsule (20 mg total) by mouth daily. 90 capsule 2  . penciclovir (DENAVIR) 1 % cream Apply 1 application topically every 2 (two) hours. 1.5 g 1  . Precision Thin Lancets MISC Use as directed to check sugars twice daily 180 each 3  . simvastatin (ZOCOR) 40 MG tablet Take 1 tablet (40 mg total) by mouth daily. 90 tablet 3  . vitamin E 400 UNIT capsule Take 400 Units by mouth daily.      Marland Kitchen lisinopril-hydrochlorothiazide (  PRINZIDE,ZESTORETIC) 20-25 MG tablet Take 1 tablet by mouth daily. (Patient not taking: Reported on 02/01/2016) 90 tablet 3  . benzonatate (TESSALON) 200 MG capsule Take 1 capsule (200 mg total) by mouth 3 (three) times daily as needed for cough. (Patient not taking: Reported on 05/04/2016) 45 capsule 0  . ciprofloxacin (CIPRO) 500 MG tablet Take 500 mg by mouth 2 (two) times daily.     No facility-administered medications prior to visit.     Review of Systems;  Patient denies headache, fevers, malaise, unintentional weight loss, skin rash, eye pain, sinus congestion and sinus pain, sore throat, dysphagia,  hemoptysis , cough, dyspnea, wheezing, chest pain,  palpitations, orthopnea, edema, abdominal pain, nausea, melena, diarrhea, constipation, flank pain, dysuria, hematuria, urinary  Frequency, nocturia, numbness, tingling, seizures,  Focal weakness, Loss of consciousness,  Tremor, insomnia, depression, anxiety, and suicidal ideation.      Objective:  BP 132/64 (BP Location: Left Arm, Patient Position: Sitting, Cuff Size: Normal)   Pulse 84   Temp 98 F (36.7 C) (Oral)   Resp 16   Ht 4\' 11"  (1.499 m)   Wt 137 lb (62.1 kg)   SpO2 97%   BMI 27.67 kg/m   BP Readings from Last 3 Encounters:  05/04/16 132/64  02/01/16 126/66  12/30/15 (!) 142/80    Wt Readings from Last 3 Encounters:  05/04/16 137 lb (62.1 kg)  02/01/16 135 lb (61.2 kg)  12/30/15 137 lb (62.1 kg)    General appearance: alert, cooperative and appears stated age Ears: normal TM's and external ear canals both ears Throat: lips, mucosa, and tongue normal; teeth and gums normal Neck: no adenopathy, no carotid bruit, supple, symmetrical, trachea midline and thyroid not enlarged, symmetric, no tenderness/mass/nodules Back: symmetric, no curvature. ROM normal. No CVA tenderness. Lungs: clear to auscultation bilaterally Heart: regular rate and rhythm, S1, S2 normal, no murmur, click, rub or gallop Abdomen: soft, non-tender; bowel sounds normal; no masses,  no organomegaly Pulses: 2+ and symmetric Skin: Skin color, texture, turgor normal. No rashes or lesions Lymph nodes: Cervical, supraclavicular, and axillary nodes normal.  Lab Results  Component Value Date   HGBA1C 8.0 (H) 05/02/2016   HGBA1C 7.5 (H) 12/23/2015   HGBA1C 6.9 (H) 08/24/2015    Lab Results  Component Value Date   CREATININE 0.72 05/02/2016   CREATININE 0.70 12/23/2015   CREATININE 0.67 08/24/2015    Lab Results  Component Value Date   WBC 5.1 05/04/2015   HGB 12.4 05/04/2015   HCT 37.0 05/04/2015   PLT 250.0 05/04/2015   GLUCOSE 166 (H) 05/02/2016   CHOL 163 05/02/2016   TRIG 136.0  05/02/2016   HDL 53.60 05/02/2016   LDLDIRECT 104.0 05/04/2015   LDLCALC 83 05/02/2016   ALT 29 05/02/2016   AST 25 05/02/2016   NA 137 05/02/2016   K 4.6 05/02/2016   CL 101 05/02/2016   CREATININE 0.72 05/02/2016   BUN 19 05/02/2016   CO2 30 05/02/2016   TSH 1.62 02/04/2015   HGBA1C 8.0 (H) 05/02/2016   MICROALBUR 1.9 12/23/2015    Ct Cardiac Scoring  Addendum Date: 04/08/2016   ADDENDUM REPORT: 04/08/2016 16:06 CLINICAL DATA:  Risk stratification EXAM: Coronary Calcium Score TECHNIQUE: The patient was scanned on a Siemens Somatom 64 slice scanner. Axial non-contrast 3 mm slices were carried out through the heart. The data set was analyzed on a dedicated work station and scored using the [place]. FINDINGS: Non-cardiac: See separate report from [facility]. Ascending Aorta:  30 mm Pericardium: Normal Coronary arteries: No calcium detected Mild calcification of aortic valve IMPRESSION: Coronary calcium score of 0 . Jenkins Rouge Electronically Signed   By: Jenkins Rouge M.D.   On: 04/08/2016 16:06   Result Date: 04/08/2016 EXAM: OVER-READ INTERPRETATION  CT CHEST The following report is an over-read performed by radiologist Dr. Samara Snide [facility], [place] on 04/08/2016. This over-read does not include interpretation of cardiac or coronary anatomy or pathology. The coronary calcium score interpretation by the cardiologist is attached. COMPARISON:  02/06/2015 chest radiograph. FINDINGS: Cardiovascular:  Great vessels are normal in course and caliber. Mediastinum/Nodes: Unremarkable esophagus. No pathologically enlarged axillary, mediastinal or gross hilar lymph nodes, noting limited sensitivity for the detection of hilar adenopathy on this noncontrast study. Lungs/Pleura: No pneumothorax. No pleural effusion. No acute consolidative airspace disease, lung masses or significant pulmonary nodules. Upper abdomen: Unremarkable. Musculoskeletal: No aggressive appearing focal osseous  lesions. Mild thoracic spondylosis. IMPRESSION: No significant extracardiac findings. Electronically Signed: By: Ilona Sorrel M.D. On: 04/08/2016 12:07    Assessment & Plan:   Problem List Items Addressed This Visit    Diabetes mellitus without complication ([place])    Loss of control noted despite adherence to  lifetstyle and medication regimen,  Will adjust glipizide dose  to 1/2 tablet before lunch and dinner. Continue metformin,  may need to add jardiance.  Foot exam normal .  Taking aspirin, lisinopril, zetia.  Lab Results  Component Value Date   HGBA1C 8.0 (H) 05/02/2016   Lab Results  Component Value Date   MICROALBUR 1.9 12/23/2015          Relevant Orders   Comprehensive metabolic panel   Hemoglobin A1c   Hyperlipidemia    Now managed with simvastatin and zetia per dr Rockey Situ  Given findings of aortic calcifications  Lab Results  Component Value Date   CHOL 163 05/02/2016   HDL 53.60 05/02/2016   LDLCALC 83 05/02/2016   LDLDIRECT 104.0 05/04/2015   TRIG 136.0 05/02/2016   CHOLHDL 3 05/02/2016         Relevant Orders   Lipid panel   Overweight (BMI 25.0-29.9)    I have addressed  BMI and recommended wt loss of 10% of body weight over the next 6 months using a low glycemic index diet and regular exercise a minimum of 5 days per week.         Other Visit Diagnoses    Need for pneumococcal vaccination    -  Primary   Relevant Orders   Pneumococcal polysaccharide vaccine 23-valent greater than or equal to 2yo subcutaneous/IM (Completed)   Screening breast examination       Breast cancer screening       Relevant Orders   MM SCREENING BREAST TOMO BILATERAL     A total of 25 minutes of face to face time was spent with patient more than half of which was spent in counselling about the above mentioned conditions  and coordination of care  I have discontinued Ms. Vannote's ciprofloxacin and benzonatate. I am also having her maintain her vitamin E, multivitamin,  methotrexate, PRECISION THIN LANCETS, folic acid, Fish Oil, Calcium Carb-Cholecalciferol, glucose blood, omeprazole, glipiZIDE, lisinopril, loratadine, montelukast, penciclovir, metFORMIN, aspirin, ezetimibe, hydrochlorothiazide, simvastatin, lisinopril-hydrochlorothiazide, and escitalopram.  No orders of the defined types were placed in this encounter.   Medications Discontinued During This Encounter  Medication Reason  . benzonatate (TESSALON) 200 MG capsule Therapy completed  . ciprofloxacin (CIPRO) 500 MG tablet Therapy completed  Follow-up: Return in about 3 months (around 08/04/2016) for follow up diabetes.   Crecencio Mc, MD

## 2016-05-04 NOTE — Assessment & Plan Note (Signed)
I have addressed  BMI and recommended wt loss of 10% of body weight over the next 6 months using a low glycemic index diet and regular exercise a minimum of 5 days per week.   

## 2016-05-06 ENCOUNTER — Ambulatory Visit: Payer: Medicare Other | Admitting: Cardiovascular Disease

## 2016-05-07 NOTE — Assessment & Plan Note (Signed)
Now managed with simvastatin and zetia per dr Rockey Situ  Given findings of aortic calcifications  Lab Results  Component Value Date   CHOL 163 05/02/2016   HDL 53.60 05/02/2016   LDLCALC 83 05/02/2016   LDLDIRECT 104.0 05/04/2015   TRIG 136.0 05/02/2016   CHOLHDL 3 05/02/2016

## 2016-05-07 NOTE — Assessment & Plan Note (Addendum)
Loss of control noted despite adherence to  lifetstyle and medication regimen,  Will adjust glipizide dose  to 1/2 tablet before lunch and dinner. Continue metformin,  may need to add jardiance.  Foot exam normal .  Taking aspirin, lisinopril, zetia.  Lab Results  Component Value Date   HGBA1C 8.0 (H) 05/02/2016   Lab Results  Component Value Date   MICROALBUR 1.9 12/23/2015

## 2016-05-27 ENCOUNTER — Ambulatory Visit
Admission: RE | Admit: 2016-05-27 | Discharge: 2016-05-27 | Disposition: A | Discharge status: Home / Self Care | Payer: Medicare Other | Source: Ambulatory Visit | Attending: Internal Medicine | Admitting: Internal Medicine

## 2016-05-27 DIAGNOSIS — Z1231 Encounter for screening mammogram for malignant neoplasm of breast: Secondary | ICD-10-CM | POA: Insufficient documentation

## 2016-05-27 DIAGNOSIS — Z1239 Encounter for other screening for malignant neoplasm of breast: Secondary | ICD-10-CM

## 2016-06-16 DIAGNOSIS — L821 Other seborrheic keratosis: Secondary | ICD-10-CM | POA: Diagnosis not present

## 2016-06-16 DIAGNOSIS — L570 Actinic keratosis: Secondary | ICD-10-CM | POA: Diagnosis not present

## 2016-06-16 DIAGNOSIS — X32XXXA Exposure to sunlight, initial encounter: Secondary | ICD-10-CM | POA: Diagnosis not present

## 2016-06-16 DIAGNOSIS — D2272 Melanocytic nevi of left lower limb, including hip: Secondary | ICD-10-CM | POA: Diagnosis not present

## 2016-06-16 DIAGNOSIS — D2261 Melanocytic nevi of right upper limb, including shoulder: Secondary | ICD-10-CM | POA: Diagnosis not present

## 2016-06-16 DIAGNOSIS — L400 Psoriasis vulgaris: Secondary | ICD-10-CM | POA: Diagnosis not present

## 2016-06-29 ENCOUNTER — Encounter: Payer: Self-pay | Admitting: Internal Medicine

## 2016-06-29 MED ORDER — GLUCOSE BLOOD VI STRP: ORAL_STRIP | 3 refills | Status: DC

## 2016-06-29 MED ORDER — OMEPRAZOLE 20 MG PO CPDR: 20.0000 mg | DELAYED_RELEASE_CAPSULE | Freq: Every day | ORAL | 2 refills | Status: DC

## 2016-07-25 ENCOUNTER — Ambulatory Visit: Payer: Medicare Other | Admitting: Pulmonary Disease

## 2016-07-27 ENCOUNTER — Ambulatory Visit: Payer: Medicare Other | Admitting: Pulmonary Disease

## 2016-08-03 ENCOUNTER — Other Ambulatory Visit (INDEPENDENT_AMBULATORY_CARE_PROVIDER_SITE_OTHER): Payer: Medicare Other

## 2016-08-03 DIAGNOSIS — E7800 Pure hypercholesterolemia, unspecified: Secondary | ICD-10-CM | POA: Diagnosis not present

## 2016-08-03 DIAGNOSIS — E119 Type 2 diabetes mellitus without complications: Secondary | ICD-10-CM | POA: Diagnosis not present

## 2016-08-03 LAB — LIPID PANEL
CHOLESTEROL: 127 mg/dL (ref 0–200)
HDL: 52 mg/dL (ref >39.00)
LDL Cholesterol: 60 mg/dL (ref 0–99)
NONHDL: 74.66
TRIGLYCERIDES: 71 mg/dL (ref 0.0–149.0)
Total CHOL/HDL Ratio: 2
VLDL: 14.2 mg/dL (ref 0.0–40.0)

## 2016-08-03 LAB — COMPREHENSIVE METABOLIC PANEL
ALK PHOS: 78 U/L (ref 39–117)
ALT: 30 U/L (ref 0–35)
AST: 24 U/L (ref 0–37)
Albumin: 3.9 g/dL (ref 3.5–5.2)
BUN: 15 mg/dL (ref 6–23)
CHLORIDE: 99 meq/L (ref 96–112)
CO2: 31 mEq/L (ref 19–32)
Calcium: 9.1 mg/dL (ref 8.4–10.5)
Creatinine, Ser: 0.72 mg/dL (ref 0.40–1.20)
GFR: 84.68 mL/min (ref >60.00)
Glucose, Bld: 183 mg/dL — ABNORMAL HIGH (ref 70–99)
POTASSIUM: 4.8 meq/L (ref 3.5–5.1)
SODIUM: 137 meq/L (ref 135–145)
TOTAL PROTEIN: 7.1 g/dL (ref 6.0–8.3)
Total Bilirubin: 0.5 mg/dL (ref 0.2–1.2)

## 2016-08-03 LAB — HEMOGLOBIN A1C: HEMOGLOBIN A1C: 8.4 % — AB (ref 4.6–6.5)

## 2016-08-05 ENCOUNTER — Encounter: Payer: Self-pay | Admitting: Internal Medicine

## 2016-08-05 ENCOUNTER — Ambulatory Visit (INDEPENDENT_AMBULATORY_CARE_PROVIDER_SITE_OTHER): Payer: Medicare Other | Admitting: Internal Medicine

## 2016-08-05 DIAGNOSIS — E663 Overweight: Secondary | ICD-10-CM | POA: Diagnosis not present

## 2016-08-05 DIAGNOSIS — E7800 Pure hypercholesterolemia, unspecified: Secondary | ICD-10-CM | POA: Diagnosis not present

## 2016-08-05 DIAGNOSIS — E119 Type 2 diabetes mellitus without complications: Secondary | ICD-10-CM

## 2016-08-05 NOTE — Progress Notes (Signed)
Subjective:  Patient ID: Emily Ryan, female    DOB: 1945/12/21  Age: 71 y.o. MRN: [id number]  CC: Diagnoses of Diabetes mellitus without complication ([place]), Overweight (BMI 25.0-29.9), and Pure hypercholesterolemia were pertinent to this visit.  HPI Emily Ryan presents for 3 month follow up on diabetes.  Patient has no complaints today.  Patient is following a low glycemic index diet most of the time and taking all prescribed medications regularly without side effects.  Fasting sugars have been under less than 160 most of the time and post prandials have been under 200 except on rare occasions. Patient is exercising about 3 times per week and intentionally trying to lose weight .  Patient has had an eye exam in the last 12 months and checks feet regularly for signs of infection.  Patient does not walk barefoot outside,  And denies any numbness tingling or burning in feet. Patient is up to date on all recommended vaccinations  Outpatient Medications Prior to Visit  Medication Sig Dispense Refill  . aspirin 81 MG chewable tablet Chew 81 mg by mouth daily.     . Calcium Carb-Cholecalciferol 600-100 MG-UNIT CAPS Take 1 tablet by mouth daily. Two by mouth daily (Patient taking differently: Take 1 tablet by mouth daily. ) 90 capsule 3  . escitalopram (LEXAPRO) 5 MG tablet Take 1 tablet (5 mg total) by mouth daily. 90 tablet 3  . ezetimibe (ZETIA) 10 MG tablet Take 1 tablet (10 mg total) by mouth daily. 90 tablet 4  . folic acid (FOLVITE) 1 MG tablet Take 1 mg by mouth daily.    Marland Kitchen glipiZIDE (GLUCOTROL) 5 MG tablet Take 0.5 tablets (2.5 mg total) by mouth 2 (two) times daily before a meal. 90 tablet 3  . glucose blood (FREESTYLE LITE) test strip Use as instructed to test blood sugar twice daily 200 each 3  . hydrochlorothiazide (HYDRODIURIL) 12.5 MG tablet Take 1 tablet (12.5 mg total) by mouth daily. 90 tablet 3  . lisinopril (PRINIVIL,ZESTRIL) 20 MG tablet Take 1 tablet (20 mg total) by  mouth daily. 90 tablet 3  . loratadine (CLARITIN) 10 MG tablet Take 1 tablet (10 mg total) by mouth daily. 90 tablet 3  . metFORMIN (GLUCOPHAGE XR) 750 MG 24 hr tablet Take 1 tablet (750 mg total) by mouth daily with breakfast. 90 tablet 3  . methotrexate (RHEUMATREX) 2.5 MG tablet Take 7.5 mg by mouth every Saturday.     . montelukast (SINGULAIR) 10 MG tablet Take 1 tablet (10 mg total) by mouth at bedtime. 90 tablet 3  . Multiple Vitamin (MULTIVITAMIN) capsule Take 1 capsule by mouth daily.      . Omega-3 Fatty Acids (FISH OIL) 1000 MG CAPS Take 1 capsule (1,000 mg total) by mouth daily. 90 capsule 3  . omeprazole (PRILOSEC) 20 MG capsule Take 1 capsule (20 mg total) by mouth daily. 90 capsule 2  . penciclovir (DENAVIR) 1 % cream Apply 1 application topically every 2 (two) hours. 1.5 g 1  . Precision Thin Lancets MISC Use as directed to check sugars twice daily 180 each 3  . simvastatin (ZOCOR) 40 MG tablet Take 1 tablet (40 mg total) by mouth daily. 90 tablet 3  . vitamin E 400 UNIT capsule Take 400 Units by mouth daily.      Marland Kitchen lisinopril-hydrochlorothiazide (PRINZIDE,ZESTORETIC) 20-25 MG tablet Take 1 tablet by mouth daily. (Patient not taking: Reported on 02/01/2016) 90 tablet 3   No facility-administered medications prior to visit.  Review of Systems;  Patient denies headache, fevers, malaise, unintentional weight loss, skin rash, eye pain, sinus congestion and sinus pain, sore throat, dysphagia,  hemoptysis , cough, dyspnea, wheezing, chest pain, palpitations, orthopnea, edema, abdominal pain, nausea, melena, diarrhea, constipation, flank pain, dysuria, hematuria, urinary  Frequency, nocturia, numbness, tingling, seizures,  Focal weakness, Loss of consciousness,  Tremor, insomnia, depression, anxiety, and suicidal ideation.      Objective:  BP 134/72 (BP Location: Left Arm, Patient Position: Sitting, Cuff Size: Normal)   Pulse 83   Temp 98.2 F (36.8 C) (Oral)   Resp 15   Ht 4'  11" (1.499 m)   Wt 137 lb 9.6 oz (62.4 kg)   SpO2 97%   BMI 27.79 kg/m   BP Readings from Last 3 Encounters:  08/05/16 134/72  05/04/16 132/64  02/01/16 126/66    Wt Readings from Last 3 Encounters:  08/05/16 137 lb 9.6 oz (62.4 kg)  05/04/16 137 lb (62.1 kg)  02/01/16 135 lb (61.2 kg)    General appearance: alert, cooperative and appears stated age Ears: normal TM's and external ear canals both ears Throat: lips, mucosa, and tongue normal; teeth and gums normal Neck: no adenopathy, no carotid bruit, supple, symmetrical, trachea midline and thyroid not enlarged, symmetric, no tenderness/mass/nodules Back: symmetric, no curvature. ROM normal. No CVA tenderness. Lungs: clear to auscultation bilaterally Heart: regular rate and rhythm, S1, S2 normal, no murmur, click, rub or gallop Abdomen: soft, non-tender; bowel sounds normal; no masses,  no organomegaly Pulses: 2+ and symmetric Skin: Skin color, texture, turgor normal. No rashes or lesions Lymph nodes: Cervical, supraclavicular, and axillary nodes normal.  Lab Results  Component Value Date   HGBA1C 8.4 (H) 08/03/2016   HGBA1C 8.0 (H) 05/02/2016   HGBA1C 7.5 (H) 12/23/2015    Lab Results  Component Value Date   CREATININE 0.72 08/03/2016   CREATININE 0.72 05/02/2016   CREATININE 0.70 12/23/2015    Lab Results  Component Value Date   WBC 5.1 05/04/2015   HGB 12.4 05/04/2015   HCT 37.0 05/04/2015   PLT 250.0 05/04/2015   GLUCOSE 183 (H) 08/03/2016   CHOL 127 08/03/2016   TRIG 71.0 08/03/2016   HDL 52.00 08/03/2016   LDLDIRECT 104.0 05/04/2015   LDLCALC 60 08/03/2016   ALT 30 08/03/2016   AST 24 08/03/2016   NA 137 08/03/2016   K 4.8 08/03/2016   CL 99 08/03/2016   CREATININE 0.72 08/03/2016   BUN 15 08/03/2016   CO2 31 08/03/2016   TSH 1.62 02/04/2015   HGBA1C 8.4 (H) 08/03/2016   MICROALBUR 1.9 12/23/2015    Mm Screening Breast Tomo Bilateral  Result Date: 05/27/2016 CLINICAL DATA:  Screening. EXAM:  2D DIGITAL SCREENING BILATERAL MAMMOGRAM WITH CAD AND ADJUNCT TOMO COMPARISON:  Previous exam(s). ACR Breast Density Category c: The breast tissue is heterogeneously dense, which may obscure small masses. FINDINGS: There are no findings suspicious for malignancy. Images were processed with CAD. IMPRESSION: No mammographic evidence of malignancy. A result letter of this screening mammogram will be mailed directly to the patient. RECOMMENDATION: Screening mammogram in one year. (Code:SM-B-01Y) BI-RADS CATEGORY  1: Negative. Electronically Signed   By: Claudie Revering M.D.   On: 05/27/2016 16:59    Assessment & Plan:   Problem List Items Addressed This Visit    Overweight (BMI 25.0-29.9)    I have addressed  BMI and recommended wt loss of 10% of body weight over the next 6 months using a low glycemic index  diet and regular exercise a minimum of 5 days per week.        Hyperlipidemia    LDL and triglycerides are at goal on current medications. He has no side effects and liver enzymes are normal. No changes today Lab Results  Component Value Date   CHOL 127 08/03/2016   HDL 52.00 08/03/2016   LDLCALC 60 08/03/2016   LDLDIRECT 104.0 05/04/2015   TRIG 71.0 08/03/2016   CHOLHDL 2 08/03/2016   Lab Results  Component Value Date   ALT 30 08/03/2016   AST 24 08/03/2016   ALKPHOS 78 08/03/2016   BILITOT 0.5 08/03/2016         Diabetes mellitus without complication ([facility])    Uncontrolled despite taking glipzide 5 mg bid and following a health diet and regular exercise.  Discussed adding Victoza or similar medication based on coverage.         A total of 25 minutes of face to face time was spent with patient more than half of which was spent in counselling and coordination of care    I have discontinued Ms. Horan's lisinopril-hydrochlorothiazide. I am also having her maintain her vitamin E, multivitamin, methotrexate, PRECISION THIN LANCETS, folic acid, Fish Oil, Calcium Carb-Cholecalciferol,  glipiZIDE, lisinopril, loratadine, montelukast, penciclovir, metFORMIN, aspirin, ezetimibe, hydrochlorothiazide, simvastatin, escitalopram, glucose blood, and omeprazole.  No orders of the defined types were placed in this encounter.   Medications Discontinued During This Encounter  Medication Reason  . lisinopril-hydrochlorothiazide (PRINZIDE,ZESTORETIC) 20-25 MG tablet Patient has not taken in last 30 days    Follow-up: Return in about 4 months (around 12/05/2016).   Crecencio Mc, MD

## 2016-08-05 NOTE — Patient Instructions (Addendum)
Your A1c has risen to 8.4   I recommend adding on of these injectible medications to get your a1c down .Marland Kitchen They also esult in weight loss    Trulicity  (dulaglutide)  Victoza   (liraglutide ) Tanzeum (albiglutide)

## 2016-08-07 NOTE — Assessment & Plan Note (Signed)
Uncontrolled despite taking glipzide 5 mg bid and following a health diet and regular exercise.  Discussed adding Victoza or similar medication based on coverage.

## 2016-08-07 NOTE — Assessment & Plan Note (Signed)
LDL and triglycerides are at goal on current medications. He has no side effects and liver enzymes are normal. No changes today Lab Results  Component Value Date   CHOL 127 08/03/2016   HDL 52.00 08/03/2016   LDLCALC 60 08/03/2016   LDLDIRECT 104.0 05/04/2015   TRIG 71.0 08/03/2016   CHOLHDL 2 08/03/2016   Lab Results  Component Value Date   ALT 30 08/03/2016   AST 24 08/03/2016   ALKPHOS 78 08/03/2016   BILITOT 0.5 08/03/2016

## 2016-08-07 NOTE — Assessment & Plan Note (Signed)
I have addressed  BMI and recommended wt loss of 10% of body weight over the next 6 months using a low glycemic index diet and regular exercise a minimum of 5 days per week.   

## 2016-08-08 ENCOUNTER — Other Ambulatory Visit: Payer: Self-pay | Admitting: Internal Medicine

## 2016-08-08 ENCOUNTER — Encounter: Payer: Self-pay | Admitting: Internal Medicine

## 2016-08-08 ENCOUNTER — Telehealth: Payer: Self-pay | Admitting: Internal Medicine

## 2016-08-08 MED ORDER — DULAGLUTIDE 0.75 MG/0.5ML ~~LOC~~ SOAJ: 0.7500 mg | SUBCUTANEOUS | 0 refills | Status: DC

## 2016-08-23 ENCOUNTER — Encounter: Payer: Self-pay | Admitting: Internal Medicine

## 2016-08-24 ENCOUNTER — Telehealth: Payer: Self-pay | Admitting: Internal Medicine

## 2016-08-24 DIAGNOSIS — E7800 Pure hypercholesterolemia, unspecified: Secondary | ICD-10-CM

## 2016-08-24 DIAGNOSIS — I10 Essential (primary) hypertension: Secondary | ICD-10-CM

## 2016-08-24 NOTE — Telephone Encounter (Signed)
A 86m follow up and lab appt made for November.

## 2016-08-24 NOTE — Telephone Encounter (Signed)
PATIENT NEEDS AN APPT AS FOLLOWS (per Mychart) : Would it be possible to have lab work and an appointment with you in October, the week of the 15th to the 19th? LET HER KNOW that it will be too early to do the A1c (not paid for by medicare if < 30 months ,  See below for last check)  Please arrange,  Labs ordered   Lab Results  Component Value Date   HGBA1C 8.4 (H) 08/03/2016

## 2016-08-25 ENCOUNTER — Ambulatory Visit (INDEPENDENT_AMBULATORY_CARE_PROVIDER_SITE_OTHER): Payer: Medicare Other

## 2016-08-25 VITALS — BP 122/70 | HR 78 | Temp 98.4°F | Resp 14 | Ht 59.0 in | Wt 135.8 lb

## 2016-08-25 DIAGNOSIS — Z1389 Encounter for screening for other disorder: Secondary | ICD-10-CM

## 2016-08-25 DIAGNOSIS — Z0000 Encounter for general adult medical examination without abnormal findings: Secondary | ICD-10-CM

## 2016-08-25 DIAGNOSIS — Z1331 Encounter for screening for depression: Secondary | ICD-10-CM

## 2016-08-25 NOTE — Patient Instructions (Addendum)
  [name] , Thank you for taking time to come for your Medicare Wellness Visit. I appreciate your ongoing commitment to your health goals. Please review the following plan we discussed and let me know if I can assist you in the future.   Follow up with Dr. Derrel Nip as needed.    Bring a copy of your [place] and/or Living Will to be scanned into chart once completed.  Have a great day!  These are the goals we discussed: Goals    . Increase water intake       This is a list of the screening recommended for you and due dates:  Health Maintenance  Topic Date Due  . Eye exam for diabetics  07/23/2016  . Flu Shot  08/03/2016  . Hemoglobin A1C  02/03/2017  . Mammogram  05/27/2017  . Complete foot exam   08/05/2017  . Tetanus Vaccine  11/15/2020  . Colon Cancer Screening  08/16/2023  . DEXA scan (bone density measurement)  Completed  .  Hepatitis C: One time screening is recommended by Center for Disease Control  (CDC) for  adults born from [birth year] through 1966.   Completed  . Pneumonia vaccines  Completed

## 2016-08-25 NOTE — Progress Notes (Signed)
Subjective:   Emily Ryan is a 71 y.o. female who presents for Medicare Annual (Subsequent) preventive examination.  Review of Systems:  No ROS.  Medicare Wellness Visit. Additional risk factors are reflected in the social history.  Cardiac Risk Factors include: advanced age (>91men, >67 women);hypertension     Objective:     Vitals: BP 122/70 (BP Location: Left Arm, Patient Position: Sitting, Cuff Size: Normal)   Pulse 78   Temp 98.4 F (36.9 C) (Oral)   Resp 14   Ht 4\' 11"  (1.499 m)   Wt 135 lb 12.8 oz (61.6 kg)   SpO2 95%   BMI 27.43 kg/m   Body mass index is 27.43 kg/m.   Tobacco History  Smoking Status  . Never Smoker  Smokeless Tobacco  . Never Used     Counseling given: Not Answered   Past Medical History:  Diagnosis Date  . Complicated pregnancy    1st pregnancy complicated by post operative hemorrhage and 2ng complicated by epidural  . Diabetes mellitus   . Diverticulosis of colon (without mention of hemorrhage)   . Guttate psoriasis   . Hyperlipidemia   . Hypertension   . Menopausal disorder    Past Surgical History:  Procedure Laterality Date  . ABDOMINAL HYSTERECTOMY    . APPENDECTOMY  Dec 2012  . BLADDER REPAIR  1991   lift   . CYSTOCELE REPAIR    . HERNIA REPAIR    . MOHS SURGERY N/A 05/2015   Face  . PARTIAL COLECTOMY  Nov 2012   left, secondary to diverticular perf  . RECTOCELE REPAIR    . SEPTOPLASTY  1969  . TEE WITHOUT CARDIOVERSION N/A 11/24/2015   Procedure: TRANSESOPHAGEAL ECHOCARDIOGRAM (TEE);  Surgeon: Minna Merritts, MD;  Location: [facility] ORS;  Service: Cardiovascular;  Laterality: N/A;  . TONSILLECTOMY  1953  . VARICOSE VEIN SURGERY  1974   right leg    Family History  Problem Relation Age of Onset  . Heart Problems Mother   . Macular degeneration Mother   . Cancer Father   . Cancer Maternal Grandmother        colon ca  . Diabetes Sister   . Diabetes Brother   . Stroke Brother   . Diabetes Brother   .  Breast cancer Maternal Aunt   . Breast cancer Paternal Aunt    History  Sexual Activity  . Sexual activity: Yes  . Birth control/ protection: Post-menopausal    Outpatient Encounter Prescriptions as of 08/25/2016  Medication Sig  . aspirin 81 MG chewable tablet Chew 81 mg by mouth daily.   . Calcium Carb-Cholecalciferol 600-100 MG-UNIT CAPS Take 1 tablet by mouth daily. Two by mouth daily (Patient taking differently: Take 1 tablet by mouth daily. )  . Dulaglutide (TRULICITY) 3.47 QQ/5.9DG SOPN Inject 0.75 mg into the skin once a week.  . escitalopram (LEXAPRO) 5 MG tablet Take 1 tablet (5 mg total) by mouth daily.  Marland Kitchen ezetimibe (ZETIA) 10 MG tablet Take 1 tablet (10 mg total) by mouth daily.  . folic acid (FOLVITE) 1 MG tablet Take 1 mg by mouth daily.  Marland Kitchen glipiZIDE (GLUCOTROL) 5 MG tablet Take 0.5 tablets (2.5 mg total) by mouth 2 (two) times daily before a meal.  . glucose blood (FREESTYLE LITE) test strip Use as instructed to test blood sugar twice daily  . hydrochlorothiazide (HYDRODIURIL) 12.5 MG tablet Take 1 tablet (12.5 mg total) by mouth daily.  Marland Kitchen lisinopril (PRINIVIL,ZESTRIL) 20 MG tablet  Take 1 tablet (20 mg total) by mouth daily.  Marland Kitchen loratadine (CLARITIN) 10 MG tablet Take 1 tablet (10 mg total) by mouth daily.  . metFORMIN (GLUCOPHAGE XR) 750 MG 24 hr tablet Take 1 tablet (750 mg total) by mouth daily with breakfast.  . methotrexate (RHEUMATREX) 2.5 MG tablet Take 7.5 mg by mouth every Saturday.   . montelukast (SINGULAIR) 10 MG tablet Take 1 tablet (10 mg total) by mouth at bedtime.  . Multiple Vitamin (MULTIVITAMIN) capsule Take 1 capsule by mouth daily.    . Omega-3 Fatty Acids (FISH OIL) 1000 MG CAPS Take 1 capsule (1,000 mg total) by mouth daily.  Marland Kitchen omeprazole (PRILOSEC) 20 MG capsule Take 1 capsule (20 mg total) by mouth daily.  Marland Kitchen penciclovir (DENAVIR) 1 % cream Apply 1 application topically every 2 (two) hours.  . Precision Thin Lancets MISC Use as directed to check  sugars twice daily  . simvastatin (ZOCOR) 40 MG tablet Take 1 tablet (40 mg total) by mouth daily.  . vitamin E 400 UNIT capsule Take 400 Units by mouth daily.     No facility-administered encounter medications on file as of 08/25/2016.     Activities of Daily Living In your present state of health, do you have any difficulty performing the following activities: 08/25/2016 08/26/2015  Hearing? N N  Vision? N N  Difficulty concentrating or making decisions? N N  Walking or climbing stairs? N N  Dressing or bathing? N N  Doing errands, shopping? N N  Preparing Food and eating ? N N  Using the Toilet? N N  In the past six months, have you accidently leaked urine? N N  Do you have problems with loss of bowel control? N N  Managing your Medications? N N  Managing your Finances? N N  Housekeeping or managing your Housekeeping? N N  Some recent data might be hidden    Patient Care Team: Crecencio Mc, MD as PCP - General (Internal Medicine) Minna Merritts, MD as Consulting Physician (Cardiology)    Assessment:    This is a routine wellness examination for Emily Ryan. The goal of the wellness visit is to assist the patient how to close the gaps in care and create a preventative care plan for the patient.   The roster of all physicians providing medical care to patient is listed in the Snapshot section of the chart.  Taking calcium VIT D as appropriate/Osteoporosis risk reviewed.    Safety issues reviewed; Smoke and carbon monoxide detectors in the home. No firearms in the home.  Wears seatbelts when driving or riding with others. Patient does wear sunscreen or protective clothing when in direct sunlight. No violence in the home.  Depression- PHQ 2 &9 complete.  No signs/symptoms or verbal communication regarding little pleasure in doing things, feeling down, depressed or hopeless. No changes in sleeping, energy, eating, concentrating.  No thoughts of self harm or harm towards others.   Time spent on this topic is 8 minutes.   Patient is alert, normal appearance, oriented to person/place/and time.  Correctly identified the president of the [place], recall of 3/3 words, and performing simple calculations. Displays appropriate judgement and can read correct time from watch face.   No new identified risk were noted.  No failures at ADL's or IADL's.    BMI- discussed the importance of a healthy diet, water intake and the benefits of aerobic exercise. Educational material provided.   24 hour diet recall: Breakfast: Homemade blueberry muffin,  fruit Lunch: Tomato cheese sandwich, chips Dinner: Eggplant parmesan  Daily fluid intake:  0 cups of caffeine, 3 cups of water  Dental- every 6 months.   Eye- Visual acuity not assessed per patient preference since they have regular follow up with the ophthalmologist.  Wears corrective lenses.  She plans to schedule an eye exam within the month.  Sleep patterns- Sleeps 6-7 hours at night.  Wakes feeling rested.   Patient Concerns: Doing well on Dulaglutide, numbers within range.  Exercise Activities and Dietary recommendations Current Exercise Habits: Structured exercise class, Type of exercise: yoga;calisthenics;stretching;treadmill, Time (Minutes): 60, Frequency (Times/Week): 4, Weekly Exercise (Minutes/Week): 240, Intensity: Moderate  Goals    . Increase water intake      Fall Risk Fall Risk  08/25/2016 08/26/2015 02/06/2015 01/28/2014 05/10/2013  Falls in the past year? No Yes No No No  Number falls in past yr: - 1 - - -  Injury with Fall? - No - - -  Comment - Tripped over an item on the floor - - -  Follow up - Education provided;Falls prevention discussed - - -   Depression Screen PHQ 2/9 Scores 08/25/2016 08/26/2015 02/06/2015 01/28/2014  PHQ - 2 Score 0 0 0 0  PHQ- 9 Score 0 - - -     Cognitive Function MMSE - Mini Mental State Exam 08/25/2016 08/26/2015  Orientation to time 5 5  Orientation to Place 5 5  Registration 3 3    Attention/ Calculation 5 5  Recall 3 3  Language- name 2 objects 2 2  Language- repeat 1 1  Language- follow 3 step command 3 3  Language- read & follow direction 1 1  Write a sentence 1 1  Copy design 1 1  Total score 30 30        Immunization History  Administered Date(s) Administered  . Influenza Split 10/19/2011  . Influenza, High Dose Seasonal PF 10/01/2014  . Influenza,inj,Quad PF,6+ Mos 10/31/2012, 09/26/2013, 08/26/2015  . Pneumococcal Conjugate-13 01/28/2013  . Pneumococcal Polysaccharide-23 11/02/2010, 05/04/2016  . Tdap 11/16/2010  . Zoster 10/19/2007   Screening Tests Health Maintenance  Topic Date Due  . OPHTHALMOLOGY EXAM  07/23/2016  . INFLUENZA VACCINE  08/03/2016  . HEMOGLOBIN A1C  02/03/2017  . MAMMOGRAM  05/27/2017  . FOOT EXAM  08/05/2017  . TETANUS/TDAP  11/15/2020  . COLONOSCOPY  08/16/2023  . DEXA SCAN  Completed  . Hepatitis C Screening  Completed  . PNA vac Low Risk Adult  Completed      Plan:    End of life planning; Advanced aging; Advanced directives discussed.  No HCPOA/Living Will.  Additional information provided to help them start the conversation with family.  Copy of HCPOA/Living Will requested upon completion. Time spent on this topic is 20 minutes.  I have personally reviewed and noted the following in the patient's chart:   . Medical and social history . Use of alcohol, tobacco or illicit drugs  . Current medications and supplements . Functional ability and status . Nutritional status . Physical activity . Advanced directives . List of other physicians . Hospitalizations, surgeries, and ER visits in previous 12 months . Vitals . Screenings to include cognitive, depression, and falls . Referrals and appointments  In addition, I have reviewed and discussed with patient certain preventive protocols, quality metrics, and best practice recommendations. A written personalized care plan for preventive services as well as general  preventive health recommendations were provided to patient.     Lynder Parents  L, LPN  07/09/6149     I have reviewed the above information and agree with above.   Deborra Medina, MD

## 2016-09-14 ENCOUNTER — Encounter: Payer: Self-pay | Admitting: Internal Medicine

## 2016-09-15 ENCOUNTER — Other Ambulatory Visit: Payer: Self-pay | Admitting: Internal Medicine

## 2016-09-15 MED ORDER — LORATADINE 10 MG PO TABS: 10.0000 mg | ORAL_TABLET | Freq: Every day | ORAL | 3 refills | Status: DC

## 2016-09-15 MED ORDER — MONTELUKAST SODIUM 10 MG PO TABS: 10.0000 mg | ORAL_TABLET | Freq: Every day | ORAL | 3 refills | Status: DC

## 2016-09-15 MED ORDER — LISINOPRIL 20 MG PO TABS: 20.0000 mg | ORAL_TABLET | Freq: Every day | ORAL | 3 refills | Status: DC

## 2016-09-15 MED ORDER — OMEPRAZOLE 20 MG PO CPDR: 20.0000 mg | DELAYED_RELEASE_CAPSULE | Freq: Every day | ORAL | 3 refills | Status: DC

## 2016-09-15 MED ORDER — METFORMIN HCL ER 750 MG PO TB24: 750.0000 mg | ORAL_TABLET | Freq: Every day | ORAL | 3 refills | Status: DC

## 2016-09-15 MED ORDER — GLIPIZIDE 5 MG PO TABS: 5.0000 mg | ORAL_TABLET | Freq: Two times a day (BID) | ORAL | 3 refills | Status: DC

## 2016-09-19 DIAGNOSIS — L400 Psoriasis vulgaris: Secondary | ICD-10-CM | POA: Diagnosis not present

## 2016-09-19 DIAGNOSIS — Z5181 Encounter for therapeutic drug level monitoring: Secondary | ICD-10-CM | POA: Diagnosis not present

## 2016-09-27 DIAGNOSIS — E119 Type 2 diabetes mellitus without complications: Secondary | ICD-10-CM | POA: Diagnosis not present

## 2016-09-27 LAB — HM DIABETES EYE EXAM

## 2016-09-28 NOTE — Telephone Encounter (Signed)
Error

## 2016-09-28 NOTE — Telephone Encounter (Signed)
Referral

## 2016-09-30 ENCOUNTER — Encounter: Payer: Self-pay | Admitting: Internal Medicine

## 2016-10-03 ENCOUNTER — Encounter: Payer: Self-pay | Admitting: Internal Medicine

## 2016-10-06 ENCOUNTER — Ambulatory Visit (INDEPENDENT_AMBULATORY_CARE_PROVIDER_SITE_OTHER): Payer: Medicare Other | Admitting: *Deleted

## 2016-10-06 DIAGNOSIS — Z23 Encounter for immunization: Secondary | ICD-10-CM | POA: Diagnosis not present

## 2016-10-26 DIAGNOSIS — L821 Other seborrheic keratosis: Secondary | ICD-10-CM | POA: Diagnosis not present

## 2016-10-26 DIAGNOSIS — Z85828 Personal history of other malignant neoplasm of skin: Secondary | ICD-10-CM | POA: Diagnosis not present

## 2016-10-26 DIAGNOSIS — L400 Psoriasis vulgaris: Secondary | ICD-10-CM | POA: Diagnosis not present

## 2016-10-26 DIAGNOSIS — Z872 Personal history of diseases of the skin and subcutaneous tissue: Secondary | ICD-10-CM | POA: Diagnosis not present

## 2016-11-02 ENCOUNTER — Encounter: Payer: Self-pay | Admitting: Internal Medicine

## 2016-11-03 ENCOUNTER — Encounter: Payer: Self-pay | Admitting: Internal Medicine

## 2016-11-07 ENCOUNTER — Other Ambulatory Visit (INDEPENDENT_AMBULATORY_CARE_PROVIDER_SITE_OTHER): Payer: Medicare Other

## 2016-11-07 DIAGNOSIS — E7800 Pure hypercholesterolemia, unspecified: Secondary | ICD-10-CM | POA: Diagnosis not present

## 2016-11-07 DIAGNOSIS — I10 Essential (primary) hypertension: Secondary | ICD-10-CM | POA: Diagnosis not present

## 2016-11-07 LAB — LIPID PANEL
CHOLESTEROL: 168 mg/dL (ref 0–200)
HDL: 55.2 mg/dL (ref >39.00)
LDL Cholesterol: 84 mg/dL (ref 0–99)
NonHDL: 113.27
Total CHOL/HDL Ratio: 3
Triglycerides: 148 mg/dL (ref 0.0–149.0)
VLDL: 29.6 mg/dL (ref 0.0–40.0)

## 2016-11-07 LAB — COMPREHENSIVE METABOLIC PANEL
ALBUMIN: 4.1 g/dL (ref 3.5–5.2)
ALK PHOS: 68 U/L (ref 39–117)
ALT: 29 U/L (ref 0–35)
AST: 24 U/L (ref 0–37)
BUN: 16 mg/dL (ref 6–23)
CHLORIDE: 100 meq/L (ref 96–112)
CO2: 29 mEq/L (ref 19–32)
CREATININE: 0.69 mg/dL (ref 0.40–1.20)
Calcium: 9.1 mg/dL (ref 8.4–10.5)
GFR: 88.88 mL/min (ref >60.00)
Glucose, Bld: 151 mg/dL — ABNORMAL HIGH (ref 70–99)
Potassium: 4.3 mEq/L (ref 3.5–5.1)
SODIUM: 136 meq/L (ref 135–145)
TOTAL PROTEIN: 6.9 g/dL (ref 6.0–8.3)
Total Bilirubin: 0.6 mg/dL (ref 0.2–1.2)

## 2016-11-08 ENCOUNTER — Other Ambulatory Visit: Payer: Medicare Other

## 2016-11-09 ENCOUNTER — Encounter: Payer: Self-pay | Admitting: Internal Medicine

## 2016-11-10 ENCOUNTER — Ambulatory Visit (INDEPENDENT_AMBULATORY_CARE_PROVIDER_SITE_OTHER): Payer: Medicare Other | Admitting: Internal Medicine

## 2016-11-10 ENCOUNTER — Encounter: Payer: Self-pay | Admitting: Internal Medicine

## 2016-11-10 VITALS — BP 130/68 | HR 70 | Temp 98.1°F | Resp 16 | Ht 59.0 in | Wt 136.0 lb

## 2016-11-10 DIAGNOSIS — K2100 Gastro-esophageal reflux disease with esophagitis, without bleeding: Secondary | ICD-10-CM

## 2016-11-10 DIAGNOSIS — E7800 Pure hypercholesterolemia, unspecified: Secondary | ICD-10-CM

## 2016-11-10 DIAGNOSIS — E119 Type 2 diabetes mellitus without complications: Secondary | ICD-10-CM | POA: Diagnosis not present

## 2016-11-10 DIAGNOSIS — Z79899 Other long term (current) drug therapy: Secondary | ICD-10-CM | POA: Diagnosis not present

## 2016-11-10 DIAGNOSIS — I10 Essential (primary) hypertension: Secondary | ICD-10-CM

## 2016-11-10 LAB — POCT GLYCOSYLATED HEMOGLOBIN (HGB A1C): Hemoglobin A1C: 6.7

## 2016-11-10 MED ORDER — ZOSTER VAC RECOMB ADJUVANTED 50 MCG/0.5ML IM SUSR: 0.5000 mL | Freq: Once | INTRAMUSCULAR | 1 refills | Status: AC

## 2016-11-10 MED ORDER — DULAGLUTIDE 0.75 MG/0.5ML ~~LOC~~ SOAJ: 0.7500 mg | SUBCUTANEOUS | 3 refills | Status: DC

## 2016-11-10 MED ORDER — EZETIMIBE 10 MG PO TABS: 10.0000 mg | ORAL_TABLET | Freq: Every day | ORAL | 3 refills | Status: DC

## 2016-11-10 MED ORDER — SIMVASTATIN 40 MG PO TABS: 40.0000 mg | ORAL_TABLET | Freq: Every day | ORAL | 3 refills | Status: DC

## 2016-11-10 NOTE — Progress Notes (Signed)
Subjective:  Patient ID: Emily Ryan, female    DOB: [birth year]  Age: 71 y.o. MRN: [id number]  CC: There were no encounter diagnoses.  HPI Emily Ryan presents for 3 month follow up on diabetes.  Patient has no complaints today.  Patient is following a low glycemic index diet and taking all prescribed medications regularly without side effects.  Fasting sugars have been under less than 120 most of the time and post prandials have been under 160 except on rare occasions. Patient is exercising about 3 times per week and intentionally trying to lose weight .  Patient has had an eye exam in the last 12 months and checks feet regularly for signs of infection.  Patient does not walk barefoot outside,  And denies an numbness tingling or burning in feet. Patient is up to date on all recommended vaccinations  Using trulicity   Cost out of pocket is $0 .  3 months given   Scratchy throat   Takes a baby aspirin , , has had flu vaccine  Discussed shingles vaccine      Outpatient Medications Prior to Visit  Medication Sig Dispense Refill  . aspirin 81 MG chewable tablet Chew 81 mg by mouth daily.     . Calcium Carb-Cholecalciferol 600-100 MG-UNIT CAPS Take 1 tablet by mouth daily. Two by mouth daily (Patient taking differently: Take 1 tablet by mouth daily. ) 90 capsule 3  . Dulaglutide (TRULICITY) 8.34 HD/6.2IW SOPN Inject 0.75 mg into the skin once a week. 12 pen 0  . escitalopram (LEXAPRO) 5 MG tablet Take 1 tablet (5 mg total) by mouth daily. 90 tablet 3  . ezetimibe (ZETIA) 10 MG tablet Take 1 tablet (10 mg total) by mouth daily. 90 tablet 4  . folic acid (FOLVITE) 1 MG tablet Take 1 mg by mouth daily.    Marland Kitchen glipiZIDE (GLUCOTROL) 5 MG tablet Take 1 tablet (5 mg total) by mouth 2 (two) times daily before a meal. 180 tablet 3  . glucose blood (FREESTYLE LITE) test strip Use as instructed to test blood sugar twice daily 200 each 3  . hydrochlorothiazide (HYDRODIURIL) 12.5 MG tablet Take  1 tablet (12.5 mg total) by mouth daily. 90 tablet 3  . lisinopril (PRINIVIL,ZESTRIL) 20 MG tablet Take 1 tablet (20 mg total) by mouth daily. 90 tablet 3  . loratadine (CLARITIN) 10 MG tablet Take 1 tablet (10 mg total) by mouth daily. 90 tablet 3  . metFORMIN (GLUCOPHAGE XR) 750 MG 24 hr tablet Take 1 tablet (750 mg total) by mouth daily with breakfast. 90 tablet 3  . methotrexate (RHEUMATREX) 2.5 MG tablet Take 7.5 mg by mouth every Saturday.     . montelukast (SINGULAIR) 10 MG tablet Take 1 tablet (10 mg total) by mouth at bedtime. 90 tablet 3  . Multiple Vitamin (MULTIVITAMIN) capsule Take 1 capsule by mouth daily.      . Omega-3 Fatty Acids (FISH OIL) 1000 MG CAPS Take 1 capsule (1,000 mg total) by mouth daily. 90 capsule 3  . omeprazole (PRILOSEC) 20 MG capsule Take 1 capsule (20 mg total) by mouth daily. 90 capsule 3  . penciclovir (DENAVIR) 1 % cream Apply 1 application topically every 2 (two) hours. 1.5 g 1  . Precision Thin Lancets MISC Use as directed to check sugars twice daily 180 each 3  . simvastatin (ZOCOR) 40 MG tablet Take 1 tablet (40 mg total) by mouth daily. 90 tablet 3  . vitamin E 400 UNIT  capsule Take 400 Units by mouth daily.       No facility-administered medications prior to visit.     Review of Systems;  Patient denies headache, fevers, malaise, unintentional weight loss, skin rash, eye pain, sinus congestion and sinus pain, sore throat, dysphagia,  hemoptysis , cough, dyspnea, wheezing, chest pain, palpitations, orthopnea, edema, abdominal pain, nausea, melena, diarrhea, constipation, flank pain, dysuria, hematuria, urinary  Frequency, nocturia, numbness, tingling, seizures,  Focal weakness, Loss of consciousness,  Tremor, insomnia, depression, anxiety, and suicidal ideation.      Objective:  BP 130/68 (BP Location: Left Arm, Patient Position: Sitting, Cuff Size: Normal)   Pulse 70   Temp 98.1 F (36.7 C) (Oral)   Resp 16   Ht 4\' 11"  (1.499 m)   Wt 136 lb  (61.7 kg)   SpO2 97%   BMI 27.47 kg/m   BP Readings from Last 3 Encounters:  11/10/16 130/68  08/25/16 122/70  08/05/16 134/72    Wt Readings from Last 3 Encounters:  11/10/16 136 lb (61.7 kg)  08/25/16 135 lb 12.8 oz (61.6 kg)  08/05/16 137 lb 9.6 oz (62.4 kg)    General appearance: alert, cooperative and appears stated age Ears: normal TM's and external ear canals both ears Throat: lips, mucosa, and tongue normal; teeth and gums normal Neck: no adenopathy, no carotid bruit, supple, symmetrical, trachea midline and thyroid not enlarged, symmetric, no tenderness/mass/nodules Back: symmetric, no curvature. ROM normal. No CVA tenderness. Lungs: clear to auscultation bilaterally Heart: regular rate and rhythm, S1, S2 normal, no murmur, click, rub or gallop Abdomen: soft, non-tender; bowel sounds normal; no masses,  no organomegaly Pulses: 2+ and symmetric Skin: Skin color, texture, turgor normal. No rashes or lesions Lymph nodes: Cervical, supraclavicular, and axillary nodes normal.  Lab Results  Component Value Date   HGBA1C 8.4 (H) 08/03/2016   HGBA1C 8.0 (H) 05/02/2016   HGBA1C 7.5 (H) 12/23/2015    Lab Results  Component Value Date   CREATININE 0.69 11/07/2016   CREATININE 0.72 08/03/2016   CREATININE 0.72 05/02/2016    Lab Results  Component Value Date   WBC 5.1 05/04/2015   HGB 12.4 05/04/2015   HCT 37.0 05/04/2015   PLT 250.0 05/04/2015   GLUCOSE 151 (H) 11/07/2016   CHOL 168 11/07/2016   TRIG 148.0 11/07/2016   HDL 55.20 11/07/2016   LDLDIRECT 104.0 05/04/2015   LDLCALC 84 11/07/2016   ALT 29 11/07/2016   AST 24 11/07/2016   NA 136 11/07/2016   K 4.3 11/07/2016   CL 100 11/07/2016   CREATININE 0.69 11/07/2016   BUN 16 11/07/2016   CO2 29 11/07/2016   TSH 1.62 02/04/2015   HGBA1C 8.4 (H) 08/03/2016   MICROALBUR 1.9 12/23/2015    Mm Screening Breast Tomo Bilateral  Result Date: 05/27/2016 CLINICAL DATA:  Screening. EXAM: 2D DIGITAL SCREENING  BILATERAL MAMMOGRAM WITH CAD AND ADJUNCT TOMO COMPARISON:  Previous exam(s). ACR Breast Density Category c: The breast tissue is heterogeneously dense, which may obscure small masses. FINDINGS: There are no findings suspicious for malignancy. Images were processed with CAD. IMPRESSION: No mammographic evidence of malignancy. A result letter of this screening mammogram will be mailed directly to the patient. RECOMMENDATION: Screening mammogram in one year. (Code:SM-B-01Y) BI-RADS CATEGORY  1: Negative. Electronically Signed   By: Claudie Revering M.D.   On: 05/27/2016 16:59    Assessment & Plan:   Problem List Items Addressed This Visit    None      I  am having Emily Ryan maintain her vitamin E, multivitamin, methotrexate, PRECISION THIN LANCETS, folic acid, Fish Oil, Calcium Carb-Cholecalciferol, penciclovir, aspirin, ezetimibe, hydrochlorothiazide, simvastatin, escitalopram, glucose blood, Dulaglutide, glipiZIDE, lisinopril, loratadine, metFORMIN, montelukast, and omeprazole.  No orders of the defined types were placed in this encounter.   There are no discontinued medications.  Follow-up: No Follow-up on file.   Crecencio Mc, MD

## 2016-11-12 NOTE — Assessment & Plan Note (Signed)
LFTs and CMET nornal. On MTX. Dr Jefm Bryant checking CBC   Lab Results  Component Value Date   WBC 5.1 05/04/2015   HGB 12.4 05/04/2015   HCT 37.0 05/04/2015   MCV 87.1 05/04/2015   PLT 250.0 05/04/2015

## 2016-11-12 NOTE — Assessment & Plan Note (Signed)
Well controlled on current regimen. Renal function stable, no changes today.  Lab Results  Component Value Date   CREATININE 0.69 11/07/2016   Lab Results  Component Value Date   NA 136 11/07/2016   K 4.3 11/07/2016   CL 100 11/07/2016   CO2 29 11/07/2016

## 2016-11-12 NOTE — Assessment & Plan Note (Signed)
Improved control with addition of weekly Trulicity to glipzide 5 mg bid and following a healthy diet and regular exercise.   Lab Results  Component Value Date   HGBA1C 6.7 11/10/2016   Lab Results  Component Value Date   MICROALBUR 1.9 12/23/2015

## 2016-11-12 NOTE — Assessment & Plan Note (Signed)
Managed with omeprazole 

## 2016-11-12 NOTE — Assessment & Plan Note (Addendum)
LDL and triglycerides are at goal on simvastatin. Shehas no side effects and liver enzymes are normal. No changes today. Continue asa for primary prevention   Lab Results  Component Value Date   CHOL 168 11/07/2016   HDL 55.20 11/07/2016   LDLCALC 84 11/07/2016   LDLDIRECT 104.0 05/04/2015   TRIG 148.0 11/07/2016   CHOLHDL 3 11/07/2016   Lab Results  Component Value Date   ALT 29 11/07/2016   AST 24 11/07/2016   ALKPHOS 68 11/07/2016   BILITOT 0.6 11/07/2016

## 2016-11-14 ENCOUNTER — Ambulatory Visit (INDEPENDENT_AMBULATORY_CARE_PROVIDER_SITE_OTHER): Payer: Medicare Other | Admitting: Pulmonary Disease

## 2016-11-14 ENCOUNTER — Encounter: Payer: Self-pay | Admitting: Pulmonary Disease

## 2016-11-14 DIAGNOSIS — J209 Acute bronchitis, unspecified: Secondary | ICD-10-CM | POA: Diagnosis not present

## 2016-11-14 MED ORDER — BENZONATATE 100 MG PO CAPS: 100.0000 mg | ORAL_CAPSULE | Freq: Four times a day (QID) | ORAL | 0 refills | Status: DC | PRN

## 2016-11-14 NOTE — Progress Notes (Signed)
Subjective:    Patient ID: Emily Ryan, female    DOB: 1945/03/07, 71 y.o.   MRN: [id number]  C.C.:  Acute Visit for Recurrent Cough.  HPI Last seen in the office January 2018 by Dr. Lake Bells. Evaluated by our pulmonary office in [place] prior to that. Previously treated with course of ciprofloxacin. Patient also previously was on Tessalon Perles for cough suppression and saline sinus rinse. Cough was thought to be secondary to a viral bronchitis. Previously tapered from Symbicort to Asmanex. She reports for years she would get bronchitis twice yearly. She reports her symptoms became more controlled since she has been taking Singulair & Claritin.   She reports her cough started back on Thursday of last week. She reports her cough seems to recur in the "late Fall, mid Winter, or early Spring". She reports her chest feels "tight" and she has trouble sleeping. She reports her cough is producing minimal mucus. She reports possibly some mild wheezing. No sore throat. No sick contacts but did work at [employer] with the election on Tuesday. She is currently not on any inhalers. She does report that she noticed a scratchy throat on Wednesday.  Review of Systems She denies any fever, chills, or sweats. No sinus congestion, pressure or drainage. No headache. No abdominal pain, nausea or emesis. She does report chest pain but this only from her coughing. No reflux or dyspepsia.   No Known Allergies  Current Outpatient Medications on File Prior to Visit  Medication Sig Dispense Refill  . aspirin 81 MG chewable tablet Chew 81 mg by mouth daily.     . Calcium Carb-Cholecalciferol 600-100 MG-UNIT CAPS Take 1 tablet by mouth daily. Two by mouth daily (Patient taking differently: Take 1 tablet by mouth daily. ) 90 capsule 3  . Dulaglutide (TRULICITY) 4.09 BD/5.3GD SOPN Inject 0.75 mg once a week into the skin. 13 pen 3  . escitalopram (LEXAPRO) 5 MG tablet Take 1 tablet (5 mg total) by mouth daily. 90  tablet 3  . ezetimibe (ZETIA) 10 MG tablet Take 1 tablet (10 mg total) daily by mouth. 90 tablet 3  . folic acid (FOLVITE) 1 MG tablet Take 1 mg by mouth daily.    Marland Kitchen glipiZIDE (GLUCOTROL) 5 MG tablet Take 1 tablet (5 mg total) by mouth 2 (two) times daily before a meal. 180 tablet 3  . glucose blood (FREESTYLE LITE) test strip Use as instructed to test blood sugar twice daily 200 each 3  . hydrochlorothiazide (HYDRODIURIL) 12.5 MG tablet Take 1 tablet (12.5 mg total) by mouth daily. 90 tablet 3  . lisinopril (PRINIVIL,ZESTRIL) 20 MG tablet Take 1 tablet (20 mg total) by mouth daily. 90 tablet 3  . loratadine (CLARITIN) 10 MG tablet Take 1 tablet (10 mg total) by mouth daily. 90 tablet 3  . metFORMIN (GLUCOPHAGE XR) 750 MG 24 hr tablet Take 1 tablet (750 mg total) by mouth daily with breakfast. 90 tablet 3  . methotrexate (RHEUMATREX) 2.5 MG tablet Take 7.5 mg by mouth every Saturday.     . montelukast (SINGULAIR) 10 MG tablet Take 1 tablet (10 mg total) by mouth at bedtime. 90 tablet 3  . Multiple Vitamin (MULTIVITAMIN) capsule Take 1 capsule by mouth daily.      Marland Kitchen omeprazole (PRILOSEC) 20 MG capsule Take 1 capsule (20 mg total) by mouth daily. 90 capsule 3  . penciclovir (DENAVIR) 1 % cream Apply 1 application topically every 2 (two) hours. 1.5 g 1  . Precision  Thin Lancets MISC Use as directed to check sugars twice daily 180 each 3  . simvastatin (ZOCOR) 40 MG tablet Take 1 tablet (40 mg total) daily by mouth. 90 tablet 3  . vitamin E 400 UNIT capsule Take 400 Units by mouth daily.       No current facility-administered medications on file prior to visit.     Past Medical History:  Diagnosis Date  . Complicated pregnancy    1st pregnancy complicated by post operative hemorrhage and 2ng complicated by epidural  . Diabetes mellitus   . Diverticulosis of colon (without mention of hemorrhage)   . Guttate psoriasis   . Hyperlipidemia   . Hypertension   . Menopausal disorder     Past  Surgical History:  Procedure Laterality Date  . ABDOMINAL HYSTERECTOMY    . APPENDECTOMY  Dec 2012  . BLADDER REPAIR  1991   lift   . CYSTOCELE REPAIR    . HERNIA REPAIR    . MOHS SURGERY N/A 05/2015   Face  . PARTIAL COLECTOMY  Nov 2012   left, secondary to diverticular perf  . RECTOCELE REPAIR    . SEPTOPLASTY  1969  . TONSILLECTOMY  1953  . VARICOSE VEIN SURGERY  1974   right leg     Family History  Problem Relation Age of Onset  . Heart Problems Mother   . Macular degeneration Mother   . Cancer Father   . Cancer Maternal Grandmother        colon ca  . Diabetes Sister   . Diabetes Brother   . Stroke Brother   . Diabetes Brother   . Breast cancer Maternal Aunt   . Breast cancer Paternal Aunt   . Lung disease Neg Hx   . Rheumatologic disease Neg Hx     Social History   Socioeconomic History  . Marital status: Married    Spouse name: None  . Number of children: None  . Years of education: None  . Highest education level: None  Social Needs  . Financial resource strain: None  . Food insecurity - worry: None  . Food insecurity - inability: None  . Transportation needs - medical: None  . Transportation needs - non-medical: None  Occupational History  . None  Tobacco Use  . Smoking status: Passive Smoke Exposure - Never Smoker  . Smokeless tobacco: Never Used  . Tobacco comment: father growing up  Substance and Sexual Activity  . Alcohol use: Yes    Alcohol/week: 3.0 oz    Types: 5 Glasses of wine per week    Comment: Occasional  . Drug use: No  . Sexual activity: Yes    Birth control/protection: Post-menopausal  Other Topics Concern  . None  Social History Narrative   [facility] (11/14/16):   She is originally from [place]. Previously has worked as a Network engineer, in [employer], & in [employer]. Her husband was in [employer] and they traveled extensively. She lived in [place], [place], [place], [place], [place], [place]. She has 2 cats currently. Remote parakeet exposure  as a child. No mold exposure. No recent hot tub exposure.       Objective:   Physical Exam BP 130/70 (BP Location: Left Arm, Cuff Size: Normal)   Pulse 94   Ht 4\' 11"  (1.499 m)   Wt 137 lb 7 oz (62.3 kg)   SpO2 95%   BMI 27.76 kg/m  General:  Awake. Alert. No acute distress. Mild central obesity.  Integument:  Warm & dry. No rash on exposed skin. No bruising. Extremities:  No cyanosis or clubbing.  HEENT:  Moist mucus membranes. No oral ulcers. Mild bilateral nasal turbinate swelling mucosa Cardiovascular:  Regular rate. No edema. Regular rhythm.  Pulmonary:  Good aeration & clear to auscultation bilaterally. Symmetric chest wall expansion. No accessory muscle use on room air. Abdomen: Soft. Normal bowel sounds. Mildly protuberant. Musculoskeletal:  Normal bulk and tone. No joint deformity or effusion appreciated. Neurological:  Cranial nerves 2-12 grossly in tact. No meningismus. Moving all 4 extremities equally.   METHACHOLINE CHALLENGE (07/03/12):  No pre-or post methacholine challenge evidence of airway obstruction on spirometry by my review. There is also no significant bronchodilator response. No abnormal bronchial hyperreactivity.   IMAGING CXR PA/LAT 02/06/15 (personally reviewed by me):  No parenchymal mass or opacity appreciated. Questionable silhouetting of medial left hemidiaphragm. No pleural effusion appreciated. Heart normal in size & mediastinum normal in contour.  LABS IgE (02/04/15):  12    Assessment & Plan:  71 y.o. female with long-standing history of recurrent bronchitis. Does not seem to meet the criteria for chronic bronchitis. Reviewed her previous spirometry which showed no evidence of significant bronchodilator response, airway obstruction, or abnormal bronchial hyperreactivity during her methacholine challenge testing. Additionally, her previous x-ray showed no parenchymal abnormalities. Symptoms seem to be consistent with a viral bronchitis. Patient educated on  warning signs/symptoms of a bacterial infection. Instructed the patient to contact my office if she had any worsening in her symptoms or new breathing problems before her next appointment.  1. Acute bronchitis: Likely viral in etiology. Initiating cough suppression with Tessalon Perles. Patient educated on signs/symptoms of bacterial superinfection. 2. Follow-up: Patient to return to clinic in 6 weeks or sooner if needed.  Sonia Baller Ashok Cordia, M.D. [facility] Pager:  [phone] After 7pm or if no response, call [phone] 3:45 PM 11/14/16

## 2016-11-14 NOTE — Patient Instructions (Signed)
   Go back to using your nasal saline rinse twice daily.  Call our office if you develop any worsening in your breathing, increase in mucus production with your cough, discolored mucus with your cough, fever, chills, or sweats. These can be an indication of a bacterial infection and then we would need to start an antibiotic.  We will see you back in 6 weeks or sooner if needed.

## 2016-12-05 ENCOUNTER — Ambulatory Visit: Payer: Medicare Other | Admitting: Internal Medicine

## 2016-12-15 ENCOUNTER — Encounter: Payer: Self-pay | Admitting: Internal Medicine

## 2016-12-15 MED ORDER — HYDROCHLOROTHIAZIDE 12.5 MG PO TABS: 12.5000 mg | ORAL_TABLET | Freq: Every day | ORAL | 3 refills | Status: DC

## 2016-12-15 NOTE — Telephone Encounter (Signed)
I have printed the script for signature.

## 2016-12-16 ENCOUNTER — Telehealth: Payer: Self-pay | Admitting: Internal Medicine

## 2016-12-16 NOTE — Telephone Encounter (Signed)
Copied from [place]. Topic: General - Other >> Dec 16, 2016 12:55 PM Emily Ryan wrote: Reason for CRM: patient calling here upset that she had sent an email to Dr Derrel Nip for her medicine hydrochlorothiazide (HYDRODIURIL) 12.5 MG tablet to be printed out so pt can take it to to be filled at [facility] she only uses [facility] only as an emergency she would like to come to the office to pick Rx up

## 2016-12-16 NOTE — Telephone Encounter (Signed)
Advised patient that she could pick script up this afternoon. Script placed up front by [place] for pick up

## 2016-12-19 NOTE — Telephone Encounter (Signed)
rx was placed up front on Friday for pt to pick up.

## 2016-12-21 ENCOUNTER — Ambulatory Visit (INDEPENDENT_AMBULATORY_CARE_PROVIDER_SITE_OTHER): Payer: Medicare Other | Admitting: Pulmonary Disease

## 2016-12-21 ENCOUNTER — Encounter: Payer: Self-pay | Admitting: Pulmonary Disease

## 2016-12-21 VITALS — BP 126/56 | HR 75 | Ht 59.0 in | Wt 136.8 lb

## 2016-12-21 DIAGNOSIS — J301 Allergic rhinitis due to pollen: Secondary | ICD-10-CM

## 2016-12-21 NOTE — Patient Instructions (Signed)
Allergic rhinitis: I am pleased that this has been a stable interval for you Keep taking Singulair at night We will see you back in November 2019 or sooner if needed

## 2016-12-21 NOTE — Progress Notes (Signed)
Subjective:    Patient ID: Emily Ryan, female    DOB: 1945/08/28, 71 y.o.   MRN: [id number]  Synopsis: Emily Ryan first saw the [facility] in 06/2012 for evaluation of annual bronchitis, allergic rhinitis and possible asthma.  Simple spirometry was normal. She did well with step down therapy from Symbicort to Asmanex. She had a methacholine challenge which was normal in July 2014.   HPI  Chief Complaint  Patient presents with  . Follow-up    follow up asthma/bronchitis, no complaints   Emily Ryan has been doing pretty well.  She was seen by my partner recently and was given an Rx for tessalon perles.  No problems with shortness of breath, not coughing up anything.  She had a flu shot.  She continues to take singulair at night that really helps.  She says this really helps.  No problems with post nasal drip right now.   Past Medical History:  Diagnosis Date  . Complicated pregnancy    1st pregnancy complicated by post operative hemorrhage and 2ng complicated by epidural  . Diabetes mellitus   . Diverticulosis of colon (without mention of hemorrhage)   . Guttate psoriasis   . Hyperlipidemia   . Hypertension   . Menopausal disorder      Review of Systems     Objective:   Physical Exam  Vitals:   12/21/16 0928  BP: (!) 126/56  Pulse: 75  SpO2: 97%  Weight: 136 lb 12.8 oz (62.1 kg)  Height: 4\' 11"  (1.499 m)  RA  Gen: well appearing HENT: OP clear, TM's clear, neck supple PULM: CTA B, normal percussion CV: RRR, no mgr, trace edema GI: BS+, soft, nontender Derm: no cyanosis or rash Psyche: normal mood and affect  PFT PFTS done at [facility] May 2014    FVC 2.44L  105% predFEV 1 1.96L (119% predicted) FEV1/FVC : 80 % pred DLCO 111% pred TLC 4.12L  102%  RV 1.66L  112%  07/2012 spiro PFT> ratio 82%, FEV 1 1.98L (105% pred), no change with BD 07/2012 methacholine challenge> no change with ay dose. Negative study  06/2012 ACQ (Asmanex) 0/7         Assessment & Plan:   Non-seasonal allergic rhinitis due to pollen  Discussion: Emily Ryan has allergic rhinitis and tends to get bronchitis once per year.  This has been a stable interval for her since her most recent episode of bronchitis.  Singulair is doing a good job of controlling her symptoms.  I will plan to see her annually.  Plan: Allergic rhinitis: I am pleased that this has been a stable interval for you Keep taking Singulair at night We will see you back in November 2019 or sooner if needed   Current Outpatient Medications:  .  aspirin 81 MG chewable tablet, Chew 81 mg by mouth daily. , Disp: , Rfl:  .  Calcium Carb-Cholecalciferol 600-100 MG-UNIT CAPS, Take 1 tablet by mouth daily. Two by mouth daily (Patient taking differently: Take 1 tablet by mouth daily. ), Disp: 90 capsule, Rfl: 3 .  Dulaglutide (TRULICITY) 5.85 ID/7.8EU SOPN, Inject 0.75 mg once a week into the skin., Disp: 13 pen, Rfl: 3 .  escitalopram (LEXAPRO) 5 MG tablet, Take 1 tablet (5 mg total) by mouth daily., Disp: 90 tablet, Rfl: 3 .  ezetimibe (ZETIA) 10 MG tablet, Take 1 tablet (10 mg total) daily by mouth., Disp: 90 tablet, Rfl: 3 .  folic acid (FOLVITE) 1 MG tablet, Take 1  mg by mouth daily., Disp: , Rfl:  .  glipiZIDE (GLUCOTROL) 5 MG tablet, Take 1 tablet (5 mg total) by mouth 2 (two) times daily before a meal., Disp: 180 tablet, Rfl: 3 .  glucose blood (FREESTYLE LITE) test strip, Use as instructed to test blood sugar twice daily, Disp: 200 each, Rfl: 3 .  hydrochlorothiazide (HYDRODIURIL) 12.5 MG tablet, Take 1 tablet (12.5 mg total) by mouth daily., Disp: 90 tablet, Rfl: 3 .  lisinopril (PRINIVIL,ZESTRIL) 20 MG tablet, Take 1 tablet (20 mg total) by mouth daily., Disp: 90 tablet, Rfl: 3 .  loratadine (CLARITIN) 10 MG tablet, Take 1 tablet (10 mg total) by mouth daily., Disp: 90 tablet, Rfl: 3 .  metFORMIN (GLUCOPHAGE XR) 750 MG 24 hr tablet, Take 1 tablet (750 mg total) by mouth daily with breakfast.,  Disp: 90 tablet, Rfl: 3 .  methotrexate (RHEUMATREX) 2.5 MG tablet, Take 7.5 mg by mouth every Saturday. , Disp: , Rfl:  .  montelukast (SINGULAIR) 10 MG tablet, Take 1 tablet (10 mg total) by mouth at bedtime., Disp: 90 tablet, Rfl: 3 .  Multiple Vitamin (MULTIVITAMIN) capsule, Take 1 capsule by mouth daily.  , Disp: , Rfl:  .  omeprazole (PRILOSEC) 20 MG capsule, Take 1 capsule (20 mg total) by mouth daily., Disp: 90 capsule, Rfl: 3 .  penciclovir (DENAVIR) 1 % cream, Apply 1 application topically every 2 (two) hours., Disp: 1.5 g, Rfl: 1 .  Precision Thin Lancets MISC, Use as directed to check sugars twice daily, Disp: 180 each, Rfl: 3 .  simvastatin (ZOCOR) 40 MG tablet, Take 1 tablet (40 mg total) daily by mouth., Disp: 90 tablet, Rfl: 3 .  vitamin E 400 UNIT capsule, Take 400 Units by mouth daily.  , Disp: , Rfl:  .  benzonatate (TESSALON) 100 MG capsule, Take 1 capsule (100 mg total) every 6 (six) hours as needed by mouth for cough. (Patient not taking: Reported on 12/21/2016), Disp: 90 capsule, Rfl: 0

## 2017-03-01 ENCOUNTER — Encounter: Payer: Self-pay | Admitting: Internal Medicine

## 2017-03-03 MED ORDER — ESCITALOPRAM OXALATE 5 MG PO TABS: 5.0000 mg | ORAL_TABLET | Freq: Every day | ORAL | 3 refills | Status: DC

## 2017-03-03 NOTE — Telephone Encounter (Signed)
Prescription printed and signed by Dr. Nicki Reaper PCP  Out of office notified patient lexapro script ready , patient was wanting prophylaxis medications for trip also . Advised without PCP in office could not offer other medication.

## 2017-03-22 ENCOUNTER — Other Ambulatory Visit: Payer: Medicare Other

## 2017-03-27 ENCOUNTER — Ambulatory Visit: Payer: Medicare Other | Admitting: Internal Medicine

## 2017-03-29 ENCOUNTER — Other Ambulatory Visit (INDEPENDENT_AMBULATORY_CARE_PROVIDER_SITE_OTHER): Payer: Medicare Other

## 2017-03-29 DIAGNOSIS — E119 Type 2 diabetes mellitus without complications: Secondary | ICD-10-CM | POA: Diagnosis not present

## 2017-03-29 DIAGNOSIS — I10 Essential (primary) hypertension: Secondary | ICD-10-CM

## 2017-03-29 DIAGNOSIS — Z79899 Other long term (current) drug therapy: Secondary | ICD-10-CM

## 2017-03-29 DIAGNOSIS — E7800 Pure hypercholesterolemia, unspecified: Secondary | ICD-10-CM

## 2017-03-29 LAB — LIPID PANEL
CHOL/HDL RATIO: 2
Cholesterol: 132 mg/dL (ref 0–200)
HDL: 55.2 mg/dL (ref >39.00)
LDL CALC: 62 mg/dL (ref 0–99)
NONHDL: 76.5
Triglycerides: 72 mg/dL (ref 0.0–149.0)
VLDL: 14.4 mg/dL (ref 0.0–40.0)

## 2017-03-29 LAB — COMPREHENSIVE METABOLIC PANEL
ALK PHOS: 63 U/L (ref 39–117)
ALT: 29 U/L (ref 0–35)
AST: 26 U/L (ref 0–37)
Albumin: 3.9 g/dL (ref 3.5–5.2)
BUN: 18 mg/dL (ref 6–23)
CHLORIDE: 101 meq/L (ref 96–112)
CO2: 30 mEq/L (ref 19–32)
Calcium: 8.9 mg/dL (ref 8.4–10.5)
Creatinine, Ser: 0.65 mg/dL (ref 0.40–1.20)
GFR: 95.12 mL/min (ref >60.00)
GLUCOSE: 143 mg/dL — AB (ref 70–99)
POTASSIUM: 4.3 meq/L (ref 3.5–5.1)
SODIUM: 139 meq/L (ref 135–145)
Total Bilirubin: 0.6 mg/dL (ref 0.2–1.2)
Total Protein: 7 g/dL (ref 6.0–8.3)

## 2017-03-29 LAB — CBC WITH DIFFERENTIAL/PLATELET
BASOS PCT: 0.5 % (ref 0.0–3.0)
Basophils Absolute: 0 10*3/uL (ref 0.0–0.1)
EOS PCT: 1.6 % (ref 0.0–5.0)
Eosinophils Absolute: 0.1 10*3/uL (ref 0.0–0.7)
HCT: 41.2 % (ref 36.0–46.0)
HEMOGLOBIN: 13.9 g/dL (ref 12.0–15.0)
LYMPHS ABS: 1.8 10*3/uL (ref 0.7–4.0)
Lymphocytes Relative: 24.6 % (ref 12.0–46.0)
MCHC: 33.8 g/dL (ref 30.0–36.0)
MCV: 97.1 fl (ref 78.0–100.0)
MONO ABS: 1 10*3/uL (ref 0.1–1.0)
MONOS PCT: 13.1 % — AB (ref 3.0–12.0)
NEUTROS PCT: 60.2 % (ref 43.0–77.0)
Neutro Abs: 4.4 10*3/uL (ref 1.4–7.7)
Platelets: 263 10*3/uL (ref 150.0–400.0)
RBC: 4.24 Mil/uL (ref 3.87–5.11)
RDW: 14.7 % (ref 11.5–15.5)
WBC: 7.3 10*3/uL (ref 4.0–10.5)

## 2017-03-29 LAB — MICROALBUMIN / CREATININE URINE RATIO
Creatinine,U: 125.5 mg/dL
MICROALB/CREAT RATIO: 0.7 mg/g (ref 0.0–30.0)
Microalb, Ur: 0.9 mg/dL (ref 0.0–1.9)

## 2017-03-29 LAB — HEMOGLOBIN A1C: Hgb A1c MFr Bld: 7 % — ABNORMAL HIGH (ref 4.6–6.5)

## 2017-04-03 ENCOUNTER — Ambulatory Visit (INDEPENDENT_AMBULATORY_CARE_PROVIDER_SITE_OTHER): Payer: Medicare Other | Admitting: Internal Medicine

## 2017-04-03 ENCOUNTER — Other Ambulatory Visit: Payer: Self-pay

## 2017-04-03 ENCOUNTER — Encounter: Payer: Self-pay | Admitting: Internal Medicine

## 2017-04-03 ENCOUNTER — Telehealth: Payer: Self-pay | Admitting: Internal Medicine

## 2017-04-03 DIAGNOSIS — E119 Type 2 diabetes mellitus without complications: Secondary | ICD-10-CM

## 2017-04-03 DIAGNOSIS — I10 Essential (primary) hypertension: Secondary | ICD-10-CM

## 2017-04-03 DIAGNOSIS — E7800 Pure hypercholesterolemia, unspecified: Secondary | ICD-10-CM

## 2017-04-03 NOTE — Progress Notes (Signed)
Subjective:  Patient ID: Emily Ryan, female    DOB: 25-Dec-1945  Age: 72 y.o. MRN: [id number]  CC: Diagnoses of Pure hypercholesterolemia, Essential hypertension, and Diabetes mellitus without complication ([place]) were pertinent to this visit.  HPI Emily Ryan presents for 4 month follow up on diabetes.  Patient has no complaints today.  Patient is following a low glycemic index diet most days and taking all prescribed medications regularly without side effects.  Fasting sugars have been under less than 140 most of the time and post prandials have been under 160 except on rare occasions. Patient is exercising about 3 times per week and intentionally trying to lose weight .  Patient has had an eye exam in the last 12 months and checks feet regularly for signs of infection.  Patient does not walk barefoot outside,  And denies any numbness tingling or burning in feet. Patient is up to date on all recommended vaccinations    Outpatient Medications Prior to Visit  Medication Sig Dispense Refill  . aspirin 81 MG chewable tablet Chew 81 mg by mouth daily.     . Calcium Carb-Cholecalciferol 600-100 MG-UNIT CAPS Take 1 tablet by mouth daily. Two by mouth daily (Patient taking differently: Take 1 tablet by mouth daily. ) 90 capsule 3  . Dulaglutide (TRULICITY) 8.24 MP/5.3IR SOPN Inject 0.75 mg once a week into the skin. 13 pen 3  . escitalopram (LEXAPRO) 5 MG tablet Take 1 tablet (5 mg total) by mouth daily. 90 tablet 3  . ezetimibe (ZETIA) 10 MG tablet Take 1 tablet (10 mg total) daily by mouth. 90 tablet 3  . folic acid (FOLVITE) 1 MG tablet Take 1 mg by mouth daily.    Marland Kitchen glipiZIDE (GLUCOTROL) 5 MG tablet Take 1 tablet (5 mg total) by mouth 2 (two) times daily before a meal. 180 tablet 3  . glucose blood (FREESTYLE LITE) test strip Use as instructed to test blood sugar twice daily 200 each 3  . hydrochlorothiazide (HYDRODIURIL) 12.5 MG tablet Take 1 tablet (12.5 mg total) by mouth daily. 90  tablet 3  . lisinopril (PRINIVIL,ZESTRIL) 20 MG tablet Take 1 tablet (20 mg total) by mouth daily. 90 tablet 3  . loratadine (CLARITIN) 10 MG tablet Take 1 tablet (10 mg total) by mouth daily. 90 tablet 3  . metFORMIN (GLUCOPHAGE XR) 750 MG 24 hr tablet Take 1 tablet (750 mg total) by mouth daily with breakfast. 90 tablet 3  . methotrexate (RHEUMATREX) 2.5 MG tablet Take 7.5 mg by mouth every Saturday.     . montelukast (SINGULAIR) 10 MG tablet Take 1 tablet (10 mg total) by mouth at bedtime. 90 tablet 3  . Multiple Vitamin (MULTIVITAMIN) capsule Take 1 capsule by mouth daily.      Marland Kitchen omeprazole (PRILOSEC) 20 MG capsule Take 1 capsule (20 mg total) by mouth daily. 90 capsule 3  . penciclovir (DENAVIR) 1 % cream Apply 1 application topically every 2 (two) hours. 1.5 g 1  . Precision Thin Lancets MISC Use as directed to check sugars twice daily 180 each 3  . simvastatin (ZOCOR) 40 MG tablet Take 1 tablet (40 mg total) daily by mouth. 90 tablet 3  . vitamin E 400 UNIT capsule Take 400 Units by mouth daily.      . benzonatate (TESSALON) 100 MG capsule Take 1 capsule (100 mg total) every 6 (six) hours as needed by mouth for cough. (Patient not taking: Reported on 12/21/2016) 90 capsule 0   No  facility-administered medications prior to visit.     Review of Systems;  Patient denies headache, fevers, malaise, unintentional weight loss, skin rash, eye pain, sinus congestion and sinus pain, sore throat, dysphagia,  hemoptysis , cough, dyspnea, wheezing, chest pain, palpitations, orthopnea, edema, abdominal pain, nausea, melena, diarrhea, constipation, flank pain, dysuria, hematuria, urinary  Frequency, nocturia, numbness, tingling, seizures,  Focal weakness, Loss of consciousness,  Tremor, insomnia, depression, anxiety, and suicidal ideation.      Objective:  BP 116/70 (BP Location: Left Arm, Patient Position: Sitting, Cuff Size: Normal)   Pulse 83   Temp 97.7 F (36.5 C)   Wt 134 lb 9.6 oz (61.1  kg)   SpO2 96%   BMI 27.19 kg/m   BP Readings from Last 3 Encounters:  04/03/17 116/70  12/21/16 (!) 126/56  11/14/16 130/70    Wt Readings from Last 3 Encounters:  04/03/17 134 lb 9.6 oz (61.1 kg)  12/21/16 136 lb 12.8 oz (62.1 kg)  11/14/16 137 lb 7 oz (62.3 kg)    General appearance: alert, cooperative and appears stated age Ears: normal TM's and external ear canals both ears Throat: lips, mucosa, and tongue normal; teeth and gums normal Neck: no adenopathy, no carotid bruit, supple, symmetrical, trachea midline and thyroid not enlarged, symmetric, no tenderness/mass/nodules Back: symmetric, no curvature. ROM normal. No CVA tenderness. Lungs: clear to auscultation bilaterally Heart: regular rate and rhythm, S1, S2 normal, no murmur, click, rub or gallop Abdomen: soft, non-tender; bowel sounds normal; no masses,  no organomegaly Pulses: 2+ and symmetric Skin: Skin color, texture, turgor normal. No rashes or lesions Lymph nodes: Cervical, supraclavicular, and axillary nodes normal.  Lab Results  Component Value Date   HGBA1C 7.0 (H) 03/29/2017   HGBA1C 6.7 11/10/2016   HGBA1C 8.4 (H) 08/03/2016    Lab Results  Component Value Date   CREATININE 0.65 03/29/2017   CREATININE 0.69 11/07/2016   CREATININE 0.72 08/03/2016    Lab Results  Component Value Date   WBC 7.3 03/29/2017   HGB 13.9 03/29/2017   HCT 41.2 03/29/2017   PLT 263.0 03/29/2017   GLUCOSE 143 (H) 03/29/2017   CHOL 132 03/29/2017   TRIG 72.0 03/29/2017   HDL 55.20 03/29/2017   LDLDIRECT 104.0 05/04/2015   LDLCALC 62 03/29/2017   ALT 29 03/29/2017   AST 26 03/29/2017   NA 139 03/29/2017   K 4.3 03/29/2017   CL 101 03/29/2017   CREATININE 0.65 03/29/2017   BUN 18 03/29/2017   CO2 30 03/29/2017   TSH 1.62 02/04/2015   HGBA1C 7.0 (H) 03/29/2017   MICROALBUR 0.9 03/29/2017    Mm Screening Breast Tomo Bilateral  Result Date: 05/27/2016 CLINICAL DATA:  Screening. EXAM: 2D DIGITAL SCREENING  BILATERAL MAMMOGRAM WITH CAD AND ADJUNCT TOMO COMPARISON:  Previous exam(s). ACR Breast Density Category c: The breast tissue is heterogeneously dense, which may obscure small masses. FINDINGS: There are no findings suspicious for malignancy. Images were processed with CAD. IMPRESSION: No mammographic evidence of malignancy. A result letter of this screening mammogram will be mailed directly to the patient. RECOMMENDATION: Screening mammogram in one year. (Code:SM-B-01Y) BI-RADS CATEGORY  1: Negative. Electronically Signed   By: Claudie Revering M.D.   On: 05/27/2016 16:59    Assessment & Plan:   Problem List Items Addressed This Visit    Hyperlipidemia    LDL and triglycerides are at goal on simvastatin. Shehas no side effects and liver enzymes are normal. No changes today. Continue asa for primary prevention  Lab Results  Component Value Date   CHOL 132 03/29/2017   HDL 55.20 03/29/2017   LDLCALC 62 03/29/2017   LDLDIRECT 104.0 05/04/2015   TRIG 72.0 03/29/2017   CHOLHDL 2 03/29/2017   Lab Results  Component Value Date   ALT 29 03/29/2017   AST 26 03/29/2017   ALKPHOS 63 03/29/2017   BILITOT 0.6 03/29/2017          Essential hypertension    Well controlled on current regimen. Renal function stable, no changes today.  Lab Results  Component Value Date   CREATININE 0.65 03/29/2017   Lab Results  Component Value Date   MICROALBUR 0.9 03/29/2017   Lab Results  Component Value Date   NA 139 03/29/2017   K 4.3 03/29/2017   CL 101 03/29/2017   CO2 30 03/29/2017         Diabetes mellitus without complication ([place])    Controlled  with addition of weekly Trulicity to glipzide 5 mg bid, metformin,  and following a healthy diet and regular exercise. Slight increase in a1c due to holidays and recent cruise.  Repeat in 4 months.   Lab Results  Component Value Date   HGBA1C 7.0 (H) 03/29/2017   Lab Results  Component Value Date   MICROALBUR 0.9 03/29/2017             I am having Emily Ryan maintain her vitamin E, multivitamin, methotrexate, PRECISION THIN LANCETS, folic acid, Calcium Carb-Cholecalciferol, penciclovir, aspirin, glucose blood, glipiZIDE, lisinopril, loratadine, metFORMIN, montelukast, omeprazole, Dulaglutide, simvastatin, ezetimibe, benzonatate, hydrochlorothiazide, and escitalopram.  No orders of the defined types were placed in this encounter.   There are no discontinued medications.  Follow-up: Return in about 4 months (around 08/03/2017) for follow up diabetes.   Crecencio Mc, MD

## 2017-04-03 NOTE — Telephone Encounter (Signed)
Not fasting  ,just a1c and cmet

## 2017-04-03 NOTE — Telephone Encounter (Signed)
Last labs 03/29/17 next appointment 08/03/17 schedule fasting right?

## 2017-04-03 NOTE — Telephone Encounter (Signed)
Do you want pt to come back in before appt to do fasting labs?

## 2017-04-03 NOTE — Patient Instructions (Signed)

## 2017-04-04 ENCOUNTER — Encounter: Payer: Self-pay | Admitting: Internal Medicine

## 2017-04-04 NOTE — Assessment & Plan Note (Addendum)
Controlled  with addition of weekly Trulicity to glipzide 5 mg bid, metformin,  and following a healthy diet and regular exercise. Slight increase in a1c due to holidays and recent cruise.  Repeat in 4 months.   Lab Results  Component Value Date   HGBA1C 7.0 (H) 03/29/2017   Lab Results  Component Value Date   MICROALBUR 0.9 03/29/2017

## 2017-04-04 NOTE — Assessment & Plan Note (Signed)
Well controlled on current regimen. Renal function stable, no changes today.  Lab Results  Component Value Date   CREATININE 0.65 03/29/2017   Lab Results  Component Value Date   MICROALBUR 0.9 03/29/2017   Lab Results  Component Value Date   NA 139 03/29/2017   K 4.3 03/29/2017   CL 101 03/29/2017   CO2 30 03/29/2017

## 2017-04-04 NOTE — Telephone Encounter (Signed)
Please schedule patient lab appointment non- fasting before next appointment in August.

## 2017-04-04 NOTE — Assessment & Plan Note (Signed)
LDL and triglycerides are at goal on simvastatin. Shehas no side effects and liver enzymes are normal. No changes today. Continue asa for primary prevention   Lab Results  Component Value Date   CHOL 132 03/29/2017   HDL 55.20 03/29/2017   LDLCALC 62 03/29/2017   LDLDIRECT 104.0 05/04/2015   TRIG 72.0 03/29/2017   CHOLHDL 2 03/29/2017   Lab Results  Component Value Date   ALT 29 03/29/2017   AST 26 03/29/2017   ALKPHOS 63 03/29/2017   BILITOT 0.6 03/29/2017

## 2017-04-26 DIAGNOSIS — B078 Other viral warts: Secondary | ICD-10-CM | POA: Diagnosis not present

## 2017-04-26 DIAGNOSIS — L570 Actinic keratosis: Secondary | ICD-10-CM | POA: Diagnosis not present

## 2017-04-26 DIAGNOSIS — Z08 Encounter for follow-up examination after completed treatment for malignant neoplasm: Secondary | ICD-10-CM | POA: Diagnosis not present

## 2017-04-26 DIAGNOSIS — X32XXXA Exposure to sunlight, initial encounter: Secondary | ICD-10-CM | POA: Diagnosis not present

## 2017-04-26 DIAGNOSIS — L400 Psoriasis vulgaris: Secondary | ICD-10-CM | POA: Diagnosis not present

## 2017-04-26 DIAGNOSIS — L538 Other specified erythematous conditions: Secondary | ICD-10-CM | POA: Diagnosis not present

## 2017-04-26 DIAGNOSIS — D485 Neoplasm of uncertain behavior of skin: Secondary | ICD-10-CM | POA: Diagnosis not present

## 2017-04-26 DIAGNOSIS — Z85828 Personal history of other malignant neoplasm of skin: Secondary | ICD-10-CM | POA: Diagnosis not present

## 2017-04-26 DIAGNOSIS — Z872 Personal history of diseases of the skin and subcutaneous tissue: Secondary | ICD-10-CM | POA: Diagnosis not present

## 2017-04-26 DIAGNOSIS — D0461 Carcinoma in situ of skin of right upper limb, including shoulder: Secondary | ICD-10-CM | POA: Diagnosis not present

## 2017-05-01 DIAGNOSIS — Z5181 Encounter for therapeutic drug level monitoring: Secondary | ICD-10-CM | POA: Diagnosis not present

## 2017-05-01 DIAGNOSIS — L400 Psoriasis vulgaris: Secondary | ICD-10-CM | POA: Diagnosis not present

## 2017-05-05 ENCOUNTER — Telehealth: Payer: Self-pay | Admitting: Internal Medicine

## 2017-05-05 NOTE — Telephone Encounter (Signed)
Placed in red folder  

## 2017-05-05 NOTE — Telephone Encounter (Signed)
Pt dropped off [employer] form to be filled out Placed in [place] color folder upfront  Please advise pt when ready

## 2017-05-08 NOTE — Telephone Encounter (Signed)
The form has been signed and returned to you in red folder

## 2017-05-08 NOTE — Telephone Encounter (Signed)
Pt was notified and form has been faxed to number provided.

## 2017-05-10 DIAGNOSIS — C44612 Basal cell carcinoma of skin of right upper limb, including shoulder: Secondary | ICD-10-CM | POA: Diagnosis not present

## 2017-06-07 ENCOUNTER — Ambulatory Visit (INDEPENDENT_AMBULATORY_CARE_PROVIDER_SITE_OTHER): Payer: Medicare Other | Admitting: Gastroenterology

## 2017-06-07 ENCOUNTER — Encounter: Payer: Self-pay | Admitting: Gastroenterology

## 2017-06-07 ENCOUNTER — Other Ambulatory Visit
Admission: RE | Admit: 2017-06-07 | Discharge: 2017-06-07 | Disposition: A | Discharge status: Home / Self Care | Payer: Medicare Other | Source: Ambulatory Visit | Attending: Gastroenterology | Admitting: Gastroenterology

## 2017-06-07 ENCOUNTER — Other Ambulatory Visit: Payer: Self-pay

## 2017-06-07 VITALS — BP 115/72 | HR 81 | Ht 59.0 in | Wt 136.2 lb

## 2017-06-07 DIAGNOSIS — Z79631 Long term (current) use of antimetabolite agent: Secondary | ICD-10-CM

## 2017-06-07 DIAGNOSIS — Z1211 Encounter for screening for malignant neoplasm of colon: Secondary | ICD-10-CM

## 2017-06-07 DIAGNOSIS — Z79899 Other long term (current) drug therapy: Secondary | ICD-10-CM | POA: Diagnosis not present

## 2017-06-07 DIAGNOSIS — Z5181 Encounter for therapeutic drug level monitoring: Secondary | ICD-10-CM | POA: Insufficient documentation

## 2017-06-07 NOTE — Progress Notes (Signed)
[name], MD [address]  [place]  [place], [place] [address]  Main: [phone]  Fax: [phone]    Gastroenterology Consultation  Referring Provider:     Oneta Rack, MD Primary Care Physician:  Crecencio Mc, MD Primary Gastroenterologist:  Dr. [name] Reason for Consultation:     Chronic methotrexate use        HPI:   Emily Ryan is a 72 y.o. pleasant Caucasian female referred by Dr. Crecencio Mc, MD  for consultation & management of chronic methotrexate use. She has history of psoriasis and has been on methotrexate for last 6 years or so. She is referred to me for risk stratification because of total cumulative dose of methotrexate >3gm since initiation of medication. She reports that her psoriasis is completely under control on methotrexate 7.5 mg weekly. Her dermatologist tried to decrease the dose to 5 mg weekly and symptoms recurred. She takes daily folate acid. She does not have any known liver disease. Her most recent LFTs have been normal. Her head CT antibody negative. Unknown hepatitis B status. She does have diabetes, last A1c 7. She reports that she likes baking and consumes a lot of white bread. It's hard for her to lose weight. She denies any GI symptoms today. She denies blood transfusions in the past  She had a colonic perforation underwent emergent surgery in 2012, as well as bowel resection. She was told that she may had perforated diverticulitis and she had to bowel prep that resulted in perforation. She said that they attempted colonoscopy in 2015 and was unsuccessful due to tight stricture in the left colon, therefore underwent a virtual CT colonoscopy which revealed a rectosigmoid stricture and inflammation. She had normal colonoscopy in 2005  NSAIDs: none  Antiplts/Anticoagulants/Anti thrombotics: none  GI Procedures:  Colonoscopy normal in 2005 CT virtual colonography in 2015 with no polyps detected  Past Medical  History:  Diagnosis Date  . Complicated pregnancy    1st pregnancy complicated by post operative hemorrhage and 2ng complicated by epidural  . Diabetes mellitus   . Diverticulosis of colon (without mention of hemorrhage)   . Guttate psoriasis   . Hyperlipidemia   . Hypertension   . Menopausal disorder     Past Surgical History:  Procedure Laterality Date  . ABDOMINAL HYSTERECTOMY    . APPENDECTOMY  Dec 2012  . BLADDER REPAIR  1991   lift   . CYSTOCELE REPAIR    . HERNIA REPAIR    . MOHS SURGERY N/A 05/2015   Face  . PARTIAL COLECTOMY  Nov 2012   left, secondary to diverticular perf  . RECTOCELE REPAIR    . SEPTOPLASTY  1969  . TEE WITHOUT CARDIOVERSION N/A 11/24/2015   Procedure: TRANSESOPHAGEAL ECHOCARDIOGRAM (TEE);  Surgeon: Minna Merritts, MD;  Location: [facility] ORS;  Service: Cardiovascular;  Laterality: N/A;  . TONSILLECTOMY  1953  . VARICOSE VEIN SURGERY  1974   right leg     Current Outpatient Medications:  .  albuterol (PROAIR HFA) 108 (90 Base) MCG/ACT inhaler, Inhale into the lungs., Disp: , Rfl:  .  Ascorbic Acid (VITAMIN C) 1000 MG tablet, Take 1,000 mg by mouth., Disp: , Rfl:  .  aspirin 81 MG chewable tablet, Chew 81 mg by mouth daily. , Disp: , Rfl:  .  Calcium Carb-Cholecalciferol 600-100 MG-UNIT CAPS, Take 1 tablet by mouth daily. Two by mouth daily (Patient taking differently: Take 1 tablet by mouth daily. ),  Disp: 90 capsule, Rfl: 3 .  calcium carbonate (CALCIUM 600) 600 MG TABS tablet, Take 600 mg by mouth., Disp: , Rfl:  .  Dulaglutide (TRULICITY) 4.16 SA/6.3KZ SOPN, Inject 0.75 mg once a week into the skin., Disp: 13 pen, Rfl: 3 .  escitalopram (LEXAPRO) 5 MG tablet, Take 1 tablet (5 mg total) by mouth daily., Disp: 90 tablet, Rfl: 3 .  ezetimibe (ZETIA) 10 MG tablet, Take 1 tablet (10 mg total) daily by mouth., Disp: 90 tablet, Rfl: 3 .  folic acid (FOLVITE) 1 MG tablet, Take 1 mg by mouth daily., Disp: , Rfl:  .  glipiZIDE (GLUCOTROL) 5 MG tablet,  Take 1 tablet (5 mg total) by mouth 2 (two) times daily before a meal., Disp: 180 tablet, Rfl: 3 .  glucose blood (FREESTYLE LITE) test strip, Use as instructed to test blood sugar twice daily, Disp: 200 each, Rfl: 3 .  hydrochlorothiazide (HYDRODIURIL) 12.5 MG tablet, Take 1 tablet (12.5 mg total) by mouth daily., Disp: 90 tablet, Rfl: 3 .  HYDROcodone-acetaminophen (NORCO/VICODIN) 5-325 MG tablet, Take by mouth., Disp: , Rfl:  .  lisinopril (PRINIVIL,ZESTRIL) 20 MG tablet, Take 1 tablet (20 mg total) by mouth daily., Disp: 90 tablet, Rfl: 3 .  loratadine (CLARITIN) 10 MG tablet, Take 1 tablet (10 mg total) by mouth daily., Disp: 90 tablet, Rfl: 3 .  metFORMIN (GLUCOPHAGE XR) 750 MG 24 hr tablet, Take 1 tablet (750 mg total) by mouth daily with breakfast., Disp: 90 tablet, Rfl: 3 .  methotrexate (RHEUMATREX) 2.5 MG tablet, Take 7.5 mg by mouth every Saturday. , Disp: , Rfl:  .  montelukast (SINGULAIR) 10 MG tablet, Take 1 tablet (10 mg total) by mouth at bedtime., Disp: 90 tablet, Rfl: 3 .  Multiple Vitamin (MULTIVITAMIN) capsule, Take 1 capsule by mouth daily.  , Disp: , Rfl:  .  omeprazole (PRILOSEC) 20 MG capsule, Take 1 capsule (20 mg total) by mouth daily., Disp: 90 capsule, Rfl: 3 .  penciclovir (DENAVIR) 1 % cream, Apply 1 application topically every 2 (two) hours., Disp: 1.5 g, Rfl: 1 .  Precision Thin Lancets MISC, Use as directed to check sugars twice daily, Disp: 180 each, Rfl: 3 .  simvastatin (ZOCOR) 40 MG tablet, Take 1 tablet (40 mg total) daily by mouth., Disp: 90 tablet, Rfl: 3 .  vitamin E 400 UNIT capsule, Take 400 Units by mouth daily.  , Disp: , Rfl:  .  benzonatate (TESSALON) 100 MG capsule, Take 1 capsule (100 mg total) every 6 (six) hours as needed by mouth for cough. (Patient not taking: Reported on 12/21/2016), Disp: 90 capsule, Rfl: 0 .  esomeprazole (NEXIUM) 20 MG packet, Take 20 mg by mouth., Disp: , Rfl:     Family History  Problem Relation Age of Onset  . Heart  Problems Mother   . Macular degeneration Mother   . Cancer Father   . Cancer Maternal Grandmother        colon ca  . Diabetes Sister   . Diabetes Brother   . Stroke Brother   . Diabetes Brother   . Breast cancer Maternal Aunt   . Breast cancer Paternal Aunt   . Lung disease Neg Hx   . Rheumatologic disease Neg Hx      Social History   Tobacco Use  . Smoking status: Passive Smoke Exposure - Never Smoker  . Smokeless tobacco: Never Used  . Tobacco comment: father growing up  Substance Use Topics  . Alcohol use: Yes  Alcohol/week: 3.0 oz    Types: 5 Glasses of wine per week    Comment: Occasional  . Drug use: No    Allergies as of 06/07/2017  . (No Known Allergies)    Review of Systems:    All systems reviewed and negative except where noted in HPI.   Physical Exam:  BP 115/72   Pulse 81   Ht 4\' 11"  (1.499 m)   Wt 136 lb 3.2 oz (61.8 kg)   BMI 27.51 kg/m  No LMP recorded. Patient has had a hysterectomy.  General:   Alert,  Well-developed, well-nourished, pleasant and cooperative in NAD Head:  Normocephalic and atraumatic. Eyes:  Sclera clear, no icterus.   Conjunctiva pink. Ears:  Normal auditory acuity. Nose:  No deformity, discharge, or lesions. Mouth:  No deformity or lesions,oropharynx pink & moist. Neck:  Supple; no masses or thyromegaly. Lungs:  Respirations even and unlabored.  Clear throughout to auscultation.   No wheezes, crackles, or rhonchi. No acute distress. Heart:  Regular rate and rhythm; no murmurs, clicks, rubs, or gallops. Abdomen:  Normal bowel sounds. Soft, non-tender and non-distended without masses, hepatosplenomegaly or hernias noted.  No guarding or rebound tenderness.   Rectal: Not performed Msk:  Symmetrical without gross deformities. Good, equal movement & strength bilaterally. Pulses:  Normal pulses noted. Extremities:  No clubbing or edema.  No cyanosis. Neurologic:  Alert and oriented x3;  grossly normal neurologically. Skin:   Intact without significant lesions or rashes. No jaundice. Lymph Nodes:  No significant cervical adenopathy. Psych:  Alert and cooperative. Normal mood and affect. Imaging Studies: reviewed  Assessment and Plan:   Emily Ryan is a 72 y.o. Caucasian female with history of psoriasis, on methotrexate 7.5 mg weekly, in remission, history of perforated diverticulitis, resulting in partial colectomy, rectosigmoid stricture is seen in consultation for risk assessment of hepatotoxicity from cumulative methotrexate dose. Generally, there is a risk for progression to fibrosis or cirrhosis when total cumulative methotrexate dose exceeds 1.5 to 2gm. Patient has history of diabetes, hyperlipidemia, overweight which can increase her risk for fatty liver. Her LFTs have been unremarkable  - Recommend ultrasound liver with elastography - check hepatitis B surface antigen, surface antibody, and core antibody total - Quantiferon gold - Continue methotrexate for now as her psoriasis is completely under remission - strongly advised for tight control of diabetes, hyperlipidemia   Follow up in 4 weeks   [name], MD

## 2017-06-08 LAB — HEPATITIS B SURFACE ANTIGEN: Hepatitis B Surface Ag: NEGATIVE

## 2017-06-08 LAB — HEPATITIS B CORE ANTIBODY, TOTAL: HEP B C TOTAL AB: NEGATIVE

## 2017-06-08 LAB — HEPATITIS B SURFACE ANTIBODY, QUANTITATIVE: Hepatitis B-Post: <3.1 m[IU]/mL — ABNORMAL LOW (ref >9.9)

## 2017-06-09 ENCOUNTER — Ambulatory Visit: Payer: Medicare Other

## 2017-06-09 ENCOUNTER — Ambulatory Visit
Admission: RE | Admit: 2017-06-09 | Discharge: 2017-06-09 | Disposition: A | Discharge status: Home / Self Care | Payer: Medicare Other | Source: Ambulatory Visit | Attending: Gastroenterology | Admitting: Gastroenterology

## 2017-06-09 DIAGNOSIS — Z5181 Encounter for therapeutic drug level monitoring: Secondary | ICD-10-CM | POA: Diagnosis not present

## 2017-06-09 DIAGNOSIS — K7689 Other specified diseases of liver: Secondary | ICD-10-CM | POA: Diagnosis not present

## 2017-06-09 DIAGNOSIS — Z79899 Other long term (current) drug therapy: Secondary | ICD-10-CM | POA: Diagnosis not present

## 2017-06-11 LAB — QUANTIFERON-TB GOLD PLUS (RQFGPL)
QUANTIFERON NIL VALUE: 0.02 [IU]/mL
QUANTIFERON TB1 AG VALUE: 0.01 [IU]/mL
QUANTIFERON TB2 AG VALUE: 0.01 [IU]/mL

## 2017-06-11 LAB — QUANTIFERON-TB GOLD PLUS: QuantiFERON-TB Gold Plus: NEGATIVE

## 2017-06-20 ENCOUNTER — Other Ambulatory Visit: Payer: Self-pay | Admitting: Internal Medicine

## 2017-06-20 DIAGNOSIS — Z1231 Encounter for screening mammogram for malignant neoplasm of breast: Secondary | ICD-10-CM

## 2017-07-05 ENCOUNTER — Ambulatory Visit
Admission: RE | Admit: 2017-07-05 | Discharge: 2017-07-05 | Disposition: A | Discharge status: Home / Self Care | Payer: Medicare Other | Source: Ambulatory Visit | Attending: Internal Medicine | Admitting: Internal Medicine

## 2017-07-05 DIAGNOSIS — Z1231 Encounter for screening mammogram for malignant neoplasm of breast: Secondary | ICD-10-CM | POA: Diagnosis not present

## 2017-07-11 ENCOUNTER — Encounter: Payer: Self-pay | Admitting: Internal Medicine

## 2017-07-17 ENCOUNTER — Ambulatory Visit: Payer: Medicare Other | Admitting: Anesthesiology

## 2017-07-17 ENCOUNTER — Encounter
Admission: RE | Disposition: A | Discharge status: Home / Self Care | Payer: Self-pay | Source: Ambulatory Visit | Attending: Gastroenterology

## 2017-07-17 ENCOUNTER — Encounter: Payer: Self-pay | Admitting: *Deleted

## 2017-07-17 ENCOUNTER — Ambulatory Visit
Admission: RE | Admit: 2017-07-17 | Discharge: 2017-07-17 | Disposition: A | Discharge status: Home / Self Care | Payer: Medicare Other | Source: Ambulatory Visit | Attending: Gastroenterology | Admitting: Gastroenterology

## 2017-07-17 DIAGNOSIS — K56699 Other intestinal obstruction unspecified as to partial versus complete obstruction: Secondary | ICD-10-CM | POA: Diagnosis not present

## 2017-07-17 DIAGNOSIS — Z79891 Long term (current) use of opiate analgesic: Secondary | ICD-10-CM | POA: Insufficient documentation

## 2017-07-17 DIAGNOSIS — K56609 Unspecified intestinal obstruction, unspecified as to partial versus complete obstruction: Secondary | ICD-10-CM | POA: Diagnosis not present

## 2017-07-17 DIAGNOSIS — E785 Hyperlipidemia, unspecified: Secondary | ICD-10-CM | POA: Diagnosis not present

## 2017-07-17 DIAGNOSIS — K624 Stenosis of anus and rectum: Secondary | ICD-10-CM

## 2017-07-17 DIAGNOSIS — Z7982 Long term (current) use of aspirin: Secondary | ICD-10-CM | POA: Diagnosis not present

## 2017-07-17 DIAGNOSIS — Z9049 Acquired absence of other specified parts of digestive tract: Secondary | ICD-10-CM | POA: Diagnosis not present

## 2017-07-17 DIAGNOSIS — Z980 Intestinal bypass and anastomosis status: Secondary | ICD-10-CM | POA: Insufficient documentation

## 2017-07-17 DIAGNOSIS — I10 Essential (primary) hypertension: Secondary | ICD-10-CM | POA: Insufficient documentation

## 2017-07-17 DIAGNOSIS — Z79899 Other long term (current) drug therapy: Secondary | ICD-10-CM | POA: Insufficient documentation

## 2017-07-17 DIAGNOSIS — E119 Type 2 diabetes mellitus without complications: Secondary | ICD-10-CM | POA: Diagnosis not present

## 2017-07-17 DIAGNOSIS — Z1211 Encounter for screening for malignant neoplasm of colon: Secondary | ICD-10-CM | POA: Diagnosis not present

## 2017-07-17 DIAGNOSIS — Z7984 Long term (current) use of oral hypoglycemic drugs: Secondary | ICD-10-CM | POA: Diagnosis not present

## 2017-07-17 DIAGNOSIS — K219 Gastro-esophageal reflux disease without esophagitis: Secondary | ICD-10-CM | POA: Diagnosis not present

## 2017-07-17 HISTORY — PX: COLONOSCOPY WITH PROPOFOL: SHX5780

## 2017-07-17 LAB — GLUCOSE, CAPILLARY: GLUCOSE-CAPILLARY: 116 mg/dL — AB (ref 70–99)

## 2017-07-17 SURGERY — COLONOSCOPY WITH PROPOFOL
Anesthesia: General

## 2017-07-17 MED ORDER — SODIUM CHLORIDE 0.9 % IV SOLN
INTRAVENOUS | Status: DC
  Administered 2017-07-17: 09:00:00 via INTRAVENOUS

## 2017-07-17 MED ORDER — LIDOCAINE HCL (PF) 2 % IJ SOLN
INTRAMUSCULAR | Status: AC
  Filled 2017-07-17: qty 10

## 2017-07-17 MED ORDER — PROPOFOL 500 MG/50ML IV EMUL
INTRAVENOUS | Status: DC | PRN
  Administered 2017-07-17: 100 ug/kg/min via INTRAVENOUS

## 2017-07-17 MED ORDER — PROPOFOL 10 MG/ML IV BOLUS
INTRAVENOUS | Status: DC | PRN
Start: 2017-07-17 — End: 2017-07-17
  Administered 2017-07-17: 80 mg via INTRAVENOUS

## 2017-07-17 MED ORDER — PROPOFOL 500 MG/50ML IV EMUL
INTRAVENOUS | Status: AC
  Filled 2017-07-17: qty 50

## 2017-07-17 NOTE — Anesthesia Preprocedure Evaluation (Signed)
Anesthesia Evaluation  Patient identified by MRN, date of birth, ID band Patient awake    Reviewed: Allergy & Precautions, H&P , NPO status , Patient's Chart, lab work & pertinent test results, reviewed documented beta blocker date and time   History of Anesthesia Complications Negative for: history of anesthetic complications  Airway Mallampati: III  TM Distance: >3 FB Neck ROM: full    Dental  (+) Dental Advidsory Given, Caps, Teeth Intact   Pulmonary neg pulmonary ROS,           Cardiovascular Exercise Tolerance: Good hypertension, (-) angina(-) CAD, (-) Past MI, (-) Cardiac Stents and (-) CABG (-) dysrhythmias + Valvular Problems/Murmurs      Neuro/Psych negative neurological ROS  negative psych ROS   GI/Hepatic negative GI ROS, Neg liver ROS,   Endo/Other  diabetes  Renal/GU negative Renal ROS  negative genitourinary   Musculoskeletal   Abdominal   Peds  Hematology negative hematology ROS (+)   Anesthesia Other Findings Past Medical History: No date: Complicated pregnancy     Comment:  1st pregnancy complicated by post operative hemorrhage               and 2ng complicated by epidural No date: Diabetes mellitus No date: Diverticulosis of colon (without mention of hemorrhage) 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 No date: Hyperlipidemia No date: Hypertension No date: Menopausal disorder   Reproductive/Obstetrics negative OB ROS                             Anesthesia Physical Anesthesia Plan  ASA: III  Anesthesia Plan: General   Post-op Pain Management:    Induction: Intravenous  PONV Risk Score and Plan: 3 and Propofol infusion and TIVA  Airway Management Planned: Nasal Cannula  Additional Equipment:   Intra-op Plan:   Post-operative Plan:   Informed Consent: I have reviewed the patients History and Physical, chart, labs and discussed the procedure including the risks,  benefits and alternatives for the proposed anesthesia with the patient or authorized representative who has indicated his/her understanding and acceptance.   Dental Advisory Given  Plan Discussed with: Anesthesiologist, CRNA and Surgeon  Anesthesia Plan Comments:         Anesthesia Quick Evaluation

## 2017-07-17 NOTE — Op Note (Signed)
[facility] Gastroenterology Patient Name: Emily Ryan Procedure Date: 07/17/2017 9:02 AM MRN: [id number] Account #: [id number] Date of Birth: Nov 20, 1945 Admit Type: Outpatient Age: 72 Room: [facility] ENDO ROOM 2 Gender: Female Note Status: Finalized Procedure:            Colonoscopy Indications:          Screening for colorectal malignant neoplasm, history of                        partial colectomy Providers:            Lin Landsman MD, MD Referring MD:         Deborra Medina, MD (Referring MD) Medicines:            Monitored Anesthesia Care Complications:        No immediate complications. Estimated blood loss:                        Minimal. Procedure:            Pre-Anesthesia Assessment:                       - Prior to the procedure, a History and Physical was                        performed, and patient medications and allergies were                        reviewed. The patient is competent. The risks and                        benefits of the procedure and the sedation options and                        risks were discussed with the patient. All questions                        were answered and informed consent was obtained.                        Patient identification and proposed procedure were                        verified by the physician, the nurse, the                        anesthesiologist, the anesthetist and the technician in                        the pre-procedure area in the procedure room in the                        endoscopy suite. Mental Status Examination: alert and                        oriented. Airway Examination: normal oropharyngeal                        airway and neck mobility. Respiratory Examination:  clear to auscultation. CV Examination: normal.                        Prophylactic Antibiotics: The patient does not require                        prophylactic antibiotics. Prior Anticoagulants:  The                        patient has taken no previous anticoagulant or                        antiplatelet agents. ASA Grade Assessment: III - A                        patient with severe systemic disease. After reviewing                        the risks and benefits, the patient was deemed in                        satisfactory condition to undergo the procedure. The                        anesthesia plan was to use monitored anesthesia care                        (MAC). Immediately prior to administration of                        medications, the patient was re-assessed for adequacy                        to receive sedatives. The heart rate, respiratory rate,                        oxygen saturations, blood pressure, adequacy of                        pulmonary ventilation, and response to care were                        monitored throughout the procedure. The physical status                        of the patient was re-assessed after the procedure.                       After obtaining informed consent, the colonoscope was                        passed under direct vision. Throughout the procedure,                        the patient's blood pressure, pulse, and oxygen                        saturations were monitored continuously. The                        Colonoscope  was introduced through the anus and                        advanced to the the rectum to examine a stricture. This                        was the intended extent. The colonoscopy was unusually                        difficult due to bowel stenosis. Successful completion                        of the procedure was aided by performing the maneuvers                        documented (below) in this report. The patient                        tolerated the procedure well. The quality of the bowel                        preparation was adequate. Findings:      The perianal and digital rectal examinations were normal.  Pertinent       negatives include normal sphincter tone and no palpable rectal lesions.      There was evidence of a prior end-to-side colo-rectal anastomosis at 10       cm proximal to the anus. This was non-patent and was characterized by       severe stenosis. The anastomosis could not be traversed. A TTS dilator       was passed through the scope. Dilation with an 08-11-08 mm and a 10-14-10       mm colonic balloon dilator was performed. The dilation site was examined       following endoscope reinsertion and showed mild improvement in luminal       narrowing. Estimated blood loss was minimal.      The retroflexed view of the distal rectum and anal verge was normal and       showed no anal or rectal abnormalities. Impression:           - Stricture at 10 cm proximal to the anus. Dilated.                       - The distal rectum and anal verge are normal on                        retroflexion view.                       - No specimens collected. Recommendation:       - Discharge patient to home (with spouse).                       - Resume previous diet today.                       - Continue present medications.                       - Perform a flexible sigmoidoscopy for retreatment in 2  weeks. Procedure Code(s):    --- Professional ---                       [phone], 52, Colonoscopy, flexible; with transendoscopic                        balloon dilation Diagnosis Code(s):    --- Professional ---                       Z12.11, Encounter for screening for malignant neoplasm                        of colon                       K56.699, Other intestinal obstruction unspecified as to                        partial versus complete obstruction CPT copyright 2017 [employer]. All rights reserved. The codes documented in this report are preliminary and upon coder review may  be revised to meet current compliance requirements. Dr. Ulyess Mort Lin Landsman MD, MD 07/17/2017 9:44:45 AM This report has been signed electronically. Number of Addenda: 0 Note Initiated On: 07/17/2017 9:02 AM Total Procedure Duration: 0 hours 26 minutes 16 seconds       [facility]

## 2017-07-17 NOTE — Anesthesia Post-op Follow-up Note (Signed)
Anesthesia QCD[employer] form completed.        

## 2017-07-17 NOTE — Transfer of Care (Signed)
Immediate Anesthesia Transfer of Care Note  Patient: Emily Ryan  Procedure(s) Performed: COLONOSCOPY WITH PROPOFOL (N/A )  Patient Location: Endoscopy Unit  Anesthesia Type:General  Level of Consciousness: awake, alert  and oriented  Airway & Oxygen Therapy: Patient Spontanous Breathing  Post-op Assessment: Report given to RN and Post -op Vital signs reviewed and stable  Post vital signs: Reviewed and stable  Last Vitals:  Vitals Value Taken Time  BP 110/97 07/17/2017  9:43 AM  Temp 36 C 07/17/2017  9:40 AM  Pulse 68 07/17/2017  9:44 AM  Resp 16 07/17/2017  9:44 AM  SpO2 96 % 07/17/2017  9:44 AM  Vitals shown include unvalidated device data.  Last Pain:  Vitals:   07/17/17 0940  TempSrc: Tympanic         Complications: No apparent anesthesia complications

## 2017-07-17 NOTE — H&P (Signed)
[name], MD [address]  [place]  [place], [place] [address]  Main: [phone]  Fax: [phone] Pager: [phone]  Primary Care Physician:  Crecencio Mc, MD Primary Gastroenterologist:  Dr. [name]  Pre-Procedure History & Physical: HPI:  Emily Ryan is a 72 y.o. female is here for an colonoscopy.   Past Medical History:  Diagnosis Date  . Complicated pregnancy    1st pregnancy complicated by post operative hemorrhage and 2ng complicated by epidural  . Diabetes mellitus   . Diverticulosis of colon (without mention of hemorrhage)   . Guttate psoriasis   . Hyperlipidemia   . Hypertension   . Menopausal disorder     Past Surgical History:  Procedure Laterality Date  . ABDOMINAL HYSTERECTOMY    . APPENDECTOMY  Dec 2012  . BLADDER REPAIR  1991   lift   . CYSTOCELE REPAIR    . HERNIA REPAIR    . MOHS SURGERY N/A 05/2015   Face  . PARTIAL COLECTOMY  Nov 2012   left, secondary to diverticular perf  . RECTOCELE REPAIR    . SEPTOPLASTY  1969  . TEE WITHOUT CARDIOVERSION N/A 11/24/2015   Procedure: TRANSESOPHAGEAL ECHOCARDIOGRAM (TEE);  Surgeon: Minna Merritts, MD;  Location: [facility] ORS;  Service: Cardiovascular;  Laterality: N/A;  . TONSILLECTOMY  1953  . VARICOSE VEIN SURGERY  1974   right leg     Prior to Admission medications   Medication Sig Start Date End Date Taking? Authorizing Provider  Ascorbic Acid (VITAMIN C) 1000 MG tablet Take 1,000 mg by mouth.   Yes [provider]  aspirin 81 MG chewable tablet Chew 81 mg by mouth daily.    Yes [provider]  Calcium Carb-Cholecalciferol 600-100 MG-UNIT CAPS Take 1 tablet by mouth daily. Two by mouth daily Patient taking differently: Take 1 tablet by mouth daily.  11/07/12  Yes Crecencio Mc, MD  calcium carbonate (CALCIUM 600) 600 MG TABS tablet Take 600 mg by mouth.   Yes [provider]  Dulaglutide (TRULICITY) 2.45 YK/9.9IP SOPN Inject 0.75 mg once a  week into the skin. 11/10/16  Yes Crecencio Mc, MD  escitalopram (LEXAPRO) 5 MG tablet Take 1 tablet (5 mg total) by mouth daily. 03/03/17  Yes Einar Pheasant, MD  esomeprazole (NEXIUM) 20 MG packet Take 20 mg by mouth.   Yes [provider]  ezetimibe (ZETIA) 10 MG tablet Take 1 tablet (10 mg total) daily by mouth. 11/10/16  Yes Crecencio Mc, MD  folic acid (FOLVITE) 1 MG tablet Take 1 mg by mouth daily.   Yes [provider]  glipiZIDE (GLUCOTROL) 5 MG tablet Take 1 tablet (5 mg total) by mouth 2 (two) times daily before a meal. 09/15/16  Yes Crecencio Mc, MD  hydrochlorothiazide (HYDRODIURIL) 12.5 MG tablet Take 1 tablet (12.5 mg total) by mouth daily. 12/15/16  Yes Crecencio Mc, MD  lisinopril (PRINIVIL,ZESTRIL) 20 MG tablet Take 1 tablet (20 mg total) by mouth daily. 09/15/16  Yes Crecencio Mc, MD  loratadine (CLARITIN) 10 MG tablet Take 1 tablet (10 mg total) by mouth daily. 09/15/16  Yes Crecencio Mc, MD  metFORMIN (GLUCOPHAGE XR) 750 MG 24 hr tablet Take 1 tablet (750 mg total) by mouth daily with breakfast. 09/15/16  Yes Crecencio Mc, MD  methotrexate (RHEUMATREX) 2.5 MG tablet Take 7.5 mg by mouth every Saturday.    Yes [provider]  montelukast (SINGULAIR) 10 MG  tablet Take 1 tablet (10 mg total) by mouth at bedtime. 09/15/16  Yes Crecencio Mc, MD  Multiple Vitamin (MULTIVITAMIN) capsule Take 1 capsule by mouth daily.     Yes [provider]  omeprazole (PRILOSEC) 20 MG capsule Take 1 capsule (20 mg total) by mouth daily. 09/15/16  Yes Crecencio Mc, MD  simvastatin (ZOCOR) 40 MG tablet Take 1 tablet (40 mg total) daily by mouth. 11/10/16  Yes Crecencio Mc, MD  vitamin E 400 UNIT capsule Take 400 Units by mouth daily.     Yes [provider]  albuterol (PROAIR HFA) 108 (90 Base) MCG/ACT inhaler Inhale into the lungs.    [provider]  benzonatate (TESSALON) 100 MG capsule Take 1 capsule (100 mg total) every 6  (six) hours as needed by mouth for cough. Patient not taking: Reported on 12/21/2016 11/14/16   Javier Glazier, MD  glucose blood (FREESTYLE LITE) test strip Use as instructed to test blood sugar twice daily 06/29/16   Crecencio Mc, MD  HYDROcodone-acetaminophen (NORCO/VICODIN) 5-325 MG tablet Take by mouth. 05/28/15   [provider]  penciclovir (DENAVIR) 1 % cream Apply 1 application topically every 2 (two) hours. 08/26/15   Crecencio Mc, MD  Precision Thin Lancets MISC Use as directed to check sugars twice daily 01/27/12   Crecencio Mc, MD    Allergies as of 06/07/2017  . (No Known Allergies)    Family History  Problem Relation Age of Onset  . Heart Problems Mother   . Macular degeneration Mother   . Cancer Father   . Cancer Maternal Grandmother        colon ca  . Diabetes Sister   . Diabetes Brother   . Stroke Brother   . Diabetes Brother   . Breast cancer Maternal Aunt   . Breast cancer Paternal Aunt   . Lung disease Neg Hx   . Rheumatologic disease Neg Hx     Social History   Socioeconomic History  . Marital status: Married    Spouse name: Not on file  . Number of children: Not on file  . Years of education: Not on file  . Highest education level: Not on file  Occupational History  . Not on file  Social Needs  . Financial resource strain: Not on file  . Food insecurity:    Worry: Not on file    Inability: Not on file  . Transportation needs:    Medical: Not on file    Non-medical: Not on file  Tobacco Use  . Smoking status: Passive Smoke Exposure - Never Smoker  . Smokeless tobacco: Never Used  . Tobacco comment: father growing up  Substance and Sexual Activity  . Alcohol use: Yes    Alcohol/week: 3.0 oz    Types: 5 Glasses of wine per week    Comment: Occasional  . Drug use: No  . Sexual activity: Yes    Birth control/protection: Post-menopausal  Lifestyle  . Physical activity:    Days per week: Not on file    Minutes per  session: Not on file  . Stress: Not on file  Relationships  . Social connections:    Talks on phone: Not on file    Gets together: Not on file    Attends religious service: Not on file    Active member of club or organization: Not on file    Attends meetings of clubs or organizations: Not on file  Relationship status: Not on file  . Intimate partner violence:    Fear of current or ex partner: Not on file    Emotionally abused: Not on file    Physically abused: Not on file    Forced sexual activity: Not on file  Other Topics Concern  . Not on file  Social History Narrative   [facility] (11/14/16):   She is originally from [place]. Previously has worked as a Network engineer, in [employer], & in [employer]. Her husband was in [employer] and they traveled extensively. She lived in [place], [place], [place], [place], [place], [place]. She has 2 cats currently. Remote parakeet exposure as a child. No mold exposure. No recent hot tub exposure.     Review of Systems: See HPI, otherwise negative ROS  Physical Exam: BP (!) 142/80   Pulse 72   Temp 98.1 F (36.7 C) (Tympanic)   Resp 18   Ht 4\' 11"  (1.499 m)   Wt 132 lb (59.9 kg)   SpO2 95%   BMI 26.66 kg/m  General:   Alert,  pleasant and cooperative in NAD Head:  Normocephalic and atraumatic. Neck:  Supple; no masses or thyromegaly. Lungs:  Clear throughout to auscultation.    Heart:  Regular rate and rhythm. Abdomen:  Soft, nontender and nondistended. Normal bowel sounds, without guarding, and without rebound.   Neurologic:  Alert and  oriented x4;  grossly normal neurologically.  Impression/Plan: Emily Ryan is here for an colonoscopy to be performed for colon cancer screening  Risks, benefits, limitations, and alternatives regarding  colonoscopy have been reviewed with the patient.  Questions have been answered.  All parties agreeable.   Sherri Sear, MD  07/17/2017, 8:27 AM

## 2017-07-17 NOTE — Anesthesia Postprocedure Evaluation (Signed)
Anesthesia Post Note  Patient: [name]  Procedure(s) Performed: COLONOSCOPY WITH PROPOFOL (N/A )  Patient location during evaluation: Endoscopy Anesthesia Type: General Level of consciousness: awake and alert Pain management: pain level controlled Vital Signs Assessment: post-procedure vital signs reviewed and stable Respiratory status: spontaneous breathing, nonlabored ventilation, respiratory function stable and patient connected to nasal cannula oxygen Cardiovascular status: blood pressure returned to baseline and stable Postop Assessment: no apparent nausea or vomiting Anesthetic complications: no     Last Vitals:  Vitals:   07/17/17 1000 07/17/17 1010  BP: 130/78 (!) 149/66  Pulse: 64 63  Resp: 14 18  Temp:    SpO2: 97% 96%    Last Pain:  Vitals:   07/17/17 0940  TempSrc: Tympanic                 Martha Clan

## 2017-07-18 ENCOUNTER — Encounter: Payer: Self-pay | Admitting: Gastroenterology

## 2017-07-18 ENCOUNTER — Other Ambulatory Visit: Payer: Self-pay

## 2017-07-18 DIAGNOSIS — K624 Stenosis of anus and rectum: Secondary | ICD-10-CM

## 2017-07-26 ENCOUNTER — Ambulatory Visit: Payer: Medicare Other | Admitting: Gastroenterology

## 2017-07-31 ENCOUNTER — Other Ambulatory Visit: Payer: Self-pay

## 2017-07-31 ENCOUNTER — Ambulatory Visit: Payer: Medicare Other | Admitting: Registered Nurse

## 2017-07-31 ENCOUNTER — Encounter: Payer: Self-pay | Admitting: *Deleted

## 2017-07-31 ENCOUNTER — Ambulatory Visit
Admission: RE | Admit: 2017-07-31 | Discharge: 2017-07-31 | Disposition: A | Discharge status: Home / Self Care | Payer: Medicare Other | Source: Ambulatory Visit | Attending: Gastroenterology | Admitting: Gastroenterology

## 2017-07-31 ENCOUNTER — Encounter
Admission: RE | Disposition: A | Discharge status: Home / Self Care | Payer: Self-pay | Source: Ambulatory Visit | Attending: Gastroenterology

## 2017-07-31 DIAGNOSIS — D126 Benign neoplasm of colon, unspecified: Secondary | ICD-10-CM

## 2017-07-31 DIAGNOSIS — K529 Noninfective gastroenteritis and colitis, unspecified: Secondary | ICD-10-CM | POA: Diagnosis not present

## 2017-07-31 DIAGNOSIS — Z7951 Long term (current) use of inhaled steroids: Secondary | ICD-10-CM | POA: Insufficient documentation

## 2017-07-31 DIAGNOSIS — K913 Postprocedural intestinal obstruction, unspecified as to partial versus complete: Secondary | ICD-10-CM | POA: Diagnosis not present

## 2017-07-31 DIAGNOSIS — D123 Benign neoplasm of transverse colon: Secondary | ICD-10-CM | POA: Diagnosis not present

## 2017-07-31 DIAGNOSIS — Z8249 Family history of ischemic heart disease and other diseases of the circulatory system: Secondary | ICD-10-CM | POA: Insufficient documentation

## 2017-07-31 DIAGNOSIS — E785 Hyperlipidemia, unspecified: Secondary | ICD-10-CM | POA: Insufficient documentation

## 2017-07-31 DIAGNOSIS — Z820 Family history of epilepsy and other diseases of the nervous system: Secondary | ICD-10-CM | POA: Insufficient documentation

## 2017-07-31 DIAGNOSIS — K635 Polyp of colon: Secondary | ICD-10-CM | POA: Diagnosis not present

## 2017-07-31 DIAGNOSIS — D120 Benign neoplasm of cecum: Secondary | ICD-10-CM | POA: Diagnosis not present

## 2017-07-31 DIAGNOSIS — Z90710 Acquired absence of both cervix and uterus: Secondary | ICD-10-CM | POA: Insufficient documentation

## 2017-07-31 DIAGNOSIS — Z7984 Long term (current) use of oral hypoglycemic drugs: Secondary | ICD-10-CM | POA: Insufficient documentation

## 2017-07-31 DIAGNOSIS — K56609 Unspecified intestinal obstruction, unspecified as to partial versus complete obstruction: Secondary | ICD-10-CM | POA: Diagnosis not present

## 2017-07-31 DIAGNOSIS — I10 Essential (primary) hypertension: Secondary | ICD-10-CM | POA: Diagnosis not present

## 2017-07-31 DIAGNOSIS — Z803 Family history of malignant neoplasm of breast: Secondary | ICD-10-CM | POA: Diagnosis not present

## 2017-07-31 DIAGNOSIS — L404 Guttate psoriasis: Secondary | ICD-10-CM | POA: Diagnosis not present

## 2017-07-31 DIAGNOSIS — Y832 Surgical operation with anastomosis, bypass or graft as the cause of abnormal reaction of the patient, or of later complication, without mention of misadventure at the time of the procedure: Secondary | ICD-10-CM | POA: Diagnosis not present

## 2017-07-31 DIAGNOSIS — E119 Type 2 diabetes mellitus without complications: Secondary | ICD-10-CM | POA: Diagnosis not present

## 2017-07-31 DIAGNOSIS — Z79899 Other long term (current) drug therapy: Secondary | ICD-10-CM | POA: Insufficient documentation

## 2017-07-31 DIAGNOSIS — Z833 Family history of diabetes mellitus: Secondary | ICD-10-CM | POA: Insufficient documentation

## 2017-07-31 DIAGNOSIS — Z800 Family history of malignant neoplasm of digestive organs: Secondary | ICD-10-CM | POA: Diagnosis not present

## 2017-07-31 DIAGNOSIS — Z7982 Long term (current) use of aspirin: Secondary | ICD-10-CM | POA: Insufficient documentation

## 2017-07-31 DIAGNOSIS — K624 Stenosis of anus and rectum: Secondary | ICD-10-CM

## 2017-07-31 DIAGNOSIS — K56699 Other intestinal obstruction unspecified as to partial versus complete obstruction: Secondary | ICD-10-CM | POA: Diagnosis not present

## 2017-07-31 DIAGNOSIS — K5730 Diverticulosis of large intestine without perforation or abscess without bleeding: Secondary | ICD-10-CM | POA: Diagnosis not present

## 2017-07-31 DIAGNOSIS — K9189 Other postprocedural complications and disorders of digestive system: Secondary | ICD-10-CM | POA: Diagnosis present

## 2017-07-31 HISTORY — PX: FLEXIBLE SIGMOIDOSCOPY: SHX5431

## 2017-07-31 LAB — GLUCOSE, CAPILLARY: Glucose-Capillary: 94 mg/dL (ref 70–99)

## 2017-07-31 SURGERY — SIGMOIDOSCOPY, FLEXIBLE
Anesthesia: General

## 2017-07-31 MED ORDER — PROPOFOL 10 MG/ML IV BOLUS
INTRAVENOUS | Status: AC
  Filled 2017-07-31: qty 20

## 2017-07-31 MED ORDER — PROPOFOL 10 MG/ML IV BOLUS
INTRAVENOUS | Status: DC | PRN
  Administered 2017-07-31: 10 mg via INTRAVENOUS
  Administered 2017-07-31: 50 mg via INTRAVENOUS

## 2017-07-31 MED ORDER — PROPOFOL 500 MG/50ML IV EMUL
INTRAVENOUS | Status: DC | PRN
  Administered 2017-07-31: 125 ug/kg/min via INTRAVENOUS

## 2017-07-31 MED ORDER — SODIUM CHLORIDE 0.9 % IV SOLN
INTRAVENOUS | Status: DC
  Administered 2017-07-31: 11:00:00 via INTRAVENOUS

## 2017-07-31 MED ORDER — LIDOCAINE HCL (CARDIAC) PF 100 MG/5ML IV SOSY
PREFILLED_SYRINGE | INTRAVENOUS | Status: DC | PRN
  Administered 2017-07-31: 40 mg via INTRAVENOUS

## 2017-07-31 NOTE — Transfer of Care (Signed)
Immediate Anesthesia Transfer of Care Note  Patient: Emily Ryan  Procedure(s) Performed: FLEXIBLE SIGMOIDOSCOPY (N/A )  Patient Location: PACU and Endoscopy Unit  Anesthesia Type:General  Level of Consciousness: awake, alert  and oriented  Airway & Oxygen Therapy: Patient Spontanous Breathing  Post-op Assessment: Report given to RN and Post -op Vital signs reviewed and stable  Post vital signs: Reviewed and stable  Last Vitals:  Vitals Value Taken Time  BP    Temp    Pulse    Resp    SpO2      Last Pain:  Vitals:   07/31/17 1055  TempSrc: Tympanic  PainSc: 0-No pain         Complications: No apparent anesthesia complications

## 2017-07-31 NOTE — Op Note (Signed)
[facility] Gastroenterology Patient Name: Emily Ryan Procedure Date: 07/31/2017 11:54 AM MRN: [id number] Account #: [id number] Date of Birth: [birth year] Admit Type: Outpatient Age: 72 Room: [facility] ENDO ROOM 4 Gender: Female Note Status: Finalized Procedure:            Colonoscopy Indications:          , here for dilation, Colorectal anastomotic stricture Providers:            Lin Landsman MD, MD Referring MD:         Deborra Medina, MD (Referring MD) Medicines:            Monitored Anesthesia Care Complications:        No immediate complications. Estimated blood loss: None. Procedure:            Pre-Anesthesia Assessment:                       - Prior to the procedure, a History and Physical was                        performed, and patient medications and allergies were                        reviewed. The patient is competent. The risks and                        benefits of the procedure and the sedation options and                        risks were discussed with the patient. All questions                        were answered and informed consent was obtained.                        Patient identification and proposed procedure were                        verified by the physician, the nurse, the                        anesthesiologist, the anesthetist and the technician in                        the pre-procedure area in the procedure room in the                        endoscopy suite. Mental Status Examination: alert and                        oriented. Airway Examination: normal oropharyngeal                        airway and neck mobility. Respiratory Examination:                        clear to auscultation. CV Examination: normal.                        Prophylactic Antibiotics: The patient does not require  prophylactic antibiotics. Prior Anticoagulants: The                        patient has taken no previous anticoagulant  or                        antiplatelet agents. ASA Grade Assessment: II - A                        patient with mild systemic disease. After reviewing the                        risks and benefits, the patient was deemed in                        satisfactory condition to undergo the procedure. The                        anesthesia plan was to use monitored anesthesia care                        (MAC). Immediately prior to administration of                        medications, the patient was re-assessed for adequacy                        to receive sedatives. The heart rate, respiratory rate,                        oxygen saturations, blood pressure, adequacy of                        pulmonary ventilation, and response to care were                        monitored throughout the procedure. The physical status                        of the patient was re-assessed after the procedure.                       After obtaining informed consent, the colonoscope was                        passed under direct vision. Throughout the procedure,                        the patient's blood pressure, pulse, and oxygen                        saturations were monitored continuously. The Endoscope                        was introduced through the anus and advanced to the the                        cecum, identified by appendiceal orifice and ileocecal  valve. The colonoscopy was performed with difficulty                        due to bowel stenosis. Successful completion of the                        procedure was aided by lavage and performing the                        maneuvers documented (below) in this report. The                        patient tolerated the procedure well. The quality of                        the bowel preparation was poor. Findings:      The perianal and digital rectal examinations were normal. Pertinent       negatives include normal sphincter tone and no  palpable rectal lesions.      There was evidence of a prior end-to-side colo-rectal anastomosis at 12       cm proximal to the anus. This was non-patent and was characterized by       severe stenosis. The anastomosis was traversed after dilation. A TTS       dilator was passed through the scope. Dilation with an 08-11-08 mm balloon       and a 10-14-10 mm balloon dilator was performed to 12 mm. The dilation       site was examined following endoscope reinsertion and showed moderate       improvement in luminal narrowing. Estimated blood loss: none.      A 6 mm polyp was found in the cecum. The polyp was sessile. The polyp       was removed with a hot snare. Resection and retrieval were complete.      A 12 mm polyp was found in the transverse colon. The polyp was sessile.       The polyp was removed with a hot snare. Resection and retrieval were       complete.      The retroflexed view of the distal rectum and anal verge was normal and       showed no anal or rectal abnormalities. Impression:           - Preparation of the colon was poor.                       - Non-patent end-to-side colo-rectal anastomosis,                        characterized by severe stenosis. Dilated.                       - One 6 mm polyp in the cecum, removed with a hot                        snare. Resected and retrieved.                       - One 12 mm polyp in the transverse colon, removed with  a hot snare. Resected and retrieved.                       - The distal rectum and anal verge are normal on                        retroflexion view. Recommendation:       - Discharge patient to home (with escort).                       - Resume previous diet today.                       - Continue present medications.                       - Await pathology results.                       - Repeat colonoscopy in 3 months because the bowel                        preparation was poor, for  retreatment and for                        surveillance.                       - Return to my office as previously scheduled. Procedure Code(s):    --- Professional ---                       [phone], Colonoscopy, flexible; with removal of tumor(s),                        polyp(s), or other lesion(s) by snare technique                       [phone], Colonoscopy, flexible; with transendoscopic                        balloon dilation Diagnosis Code(s):    --- Professional ---                       K91.89, Other postprocedural complications and                        disorders of digestive system                       D12.0, Benign neoplasm of cecum                       D12.3, Benign neoplasm of transverse colon (hepatic                        flexure or splenic flexure) CPT copyright 2017 American Medical Association. All rights reserved. The codes documented in this report are preliminary and upon coder review may  be revised to meet current compliance requirements. Dr. Ulyess Mort Lin Landsman MD, MD 07/31/2017 1:05:18 PM This report has been signed electronically. Number of Addenda: 0 Note Initiated On: 07/31/2017 11:54 AM Scope Withdrawal Time: 0 hours 26 minutes  33 seconds  Total Procedure Duration: 0 hours 44 minutes 30 seconds       [facility]

## 2017-07-31 NOTE — Anesthesia Procedure Notes (Addendum)
Date/Time: 07/31/2017 12:09 PM Performed by: Doreen Salvage, CRNA Pre-anesthesia Checklist: Patient identified, Emergency Drugs available, Suction available and Patient being monitored Patient Re-evaluated:Patient Re-evaluated prior to induction Oxygen Delivery Method: Nasal cannula Induction Type: IV induction Dental Injury: Teeth and Oropharynx as per pre-operative assessment  Comments: Nasal cannula with etCO2 monitoring

## 2017-07-31 NOTE — Anesthesia Post-op Follow-up Note (Signed)
Anesthesia QCD[employer] form completed.        

## 2017-07-31 NOTE — Anesthesia Preprocedure Evaluation (Signed)
Anesthesia Evaluation  Patient identified by MRN, date of birth, ID band Patient awake    Reviewed: Allergy & Precautions, NPO status , Patient's Chart, lab work & pertinent test results, reviewed documented beta blocker date and time   Airway Mallampati: II  TM Distance: >3 FB     Dental  (+) Chipped   Pulmonary           Cardiovascular hypertension,      Neuro/Psych    GI/Hepatic   Endo/Other  diabetes, Type 2  Renal/GU      Musculoskeletal   Abdominal   Peds  Hematology   Anesthesia Other Findings   Reproductive/Obstetrics                             Anesthesia Physical Anesthesia Plan  ASA: II  Anesthesia Plan: General   Post-op Pain Management:    Induction: Intravenous  PONV Risk Score and Plan:   Airway Management Planned:   Additional Equipment:   Intra-op Plan:   Post-operative Plan:   Informed Consent: I have reviewed the patients History and Physical, chart, labs and discussed the procedure including the risks, benefits and alternatives for the proposed anesthesia with the patient or authorized representative who has indicated his/her understanding and acceptance.     Plan Discussed with: CRNA  Anesthesia Plan Comments:         Anesthesia Quick Evaluation

## 2017-07-31 NOTE — H&P (Signed)
[name], MD [address]  [place]  [place], [place] [address]  Main: [phone]  Fax: [phone] Pager: [phone]  Primary Care Physician:  Crecencio Mc, MD Primary Gastroenterologist:  Dr. [name]  Pre-Procedure History & Physical: HPI:  Emily Ryan is a 72 y.o. female is here for Flexible sigmoidoscopy.   Past Medical History:  Diagnosis Date  . Complicated pregnancy    1st pregnancy complicated by post operative hemorrhage and 2ng complicated by epidural  . Diabetes mellitus   . Diverticulosis of colon (without mention of hemorrhage)   . Guttate psoriasis   . Hyperlipidemia   . Hypertension   . Menopausal disorder     Past Surgical History:  Procedure Laterality Date  . ABDOMINAL HYSTERECTOMY    . APPENDECTOMY  Dec 2012  . BLADDER REPAIR  1991   lift   . COLONOSCOPY WITH PROPOFOL N/A 07/17/2017   Procedure: COLONOSCOPY WITH PROPOFOL;  Surgeon: Lin Landsman, MD;  Location: [facility] ENDOSCOPY;  Service: Gastroenterology;  Laterality: N/A;  . CYSTOCELE REPAIR    . HERNIA REPAIR    . MOHS SURGERY N/A 05/2015   Face  . PARTIAL COLECTOMY  Nov 2012   left, secondary to diverticular perf  . RECTOCELE REPAIR    . SEPTOPLASTY  1969  . TEE WITHOUT CARDIOVERSION N/A 11/24/2015   Procedure: TRANSESOPHAGEAL ECHOCARDIOGRAM (TEE);  Surgeon: Minna Merritts, MD;  Location: [facility] ORS;  Service: Cardiovascular;  Laterality: N/A;  . TONSILLECTOMY  1953  . VARICOSE VEIN SURGERY  1974   right leg     Prior to Admission medications   Medication Sig Start Date End Date Taking? Authorizing Provider  Ascorbic Acid (VITAMIN C) 1000 MG tablet Take 1,000 mg by mouth.   Yes [provider]  aspirin 81 MG chewable tablet Chew 81 mg by mouth daily.    Yes [provider]  calcium carbonate (CALCIUM 600) 600 MG TABS tablet Take 600 mg by mouth.   Yes [provider]  Dulaglutide (TRULICITY) 9.52 WU/1.3KG SOPN Inject 0.75 mg  once a week into the skin. 11/10/16  Yes Crecencio Mc, MD  escitalopram (LEXAPRO) 5 MG tablet Take 1 tablet (5 mg total) by mouth daily. 03/03/17  Yes Einar Pheasant, MD  esomeprazole (NEXIUM) 20 MG packet Take 20 mg by mouth.   Yes [provider]  ezetimibe (ZETIA) 10 MG tablet Take 1 tablet (10 mg total) daily by mouth. 11/10/16  Yes Crecencio Mc, MD  folic acid (FOLVITE) 1 MG tablet Take 1 mg by mouth daily.   Yes [provider]  glipiZIDE (GLUCOTROL) 5 MG tablet Take 1 tablet (5 mg total) by mouth 2 (two) times daily before a meal. 09/15/16  Yes Crecencio Mc, MD  glucose blood (FREESTYLE LITE) test strip Use as instructed to test blood sugar twice daily 06/29/16  Yes Crecencio Mc, MD  hydrochlorothiazide (HYDRODIURIL) 12.5 MG tablet Take 1 tablet (12.5 mg total) by mouth daily. 12/15/16  Yes Crecencio Mc, MD  lisinopril (PRINIVIL,ZESTRIL) 20 MG tablet Take 1 tablet (20 mg total) by mouth daily. 09/15/16  Yes Crecencio Mc, MD  loratadine (CLARITIN) 10 MG tablet Take 1 tablet (10 mg total) by mouth daily. 09/15/16  Yes Crecencio Mc, MD  metFORMIN (GLUCOPHAGE XR) 750 MG 24 hr tablet Take 1 tablet (750 mg total) by mouth daily with breakfast. 09/15/16  Yes Crecencio Mc, MD  methotrexate (RHEUMATREX) 2.5 MG tablet  Take 7.5 mg by mouth every Saturday.    Yes [provider]  montelukast (SINGULAIR) 10 MG tablet Take 1 tablet (10 mg total) by mouth at bedtime. 09/15/16  Yes Crecencio Mc, MD  Multiple Vitamin (MULTIVITAMIN) capsule Take 1 capsule by mouth daily.     Yes [provider]  omeprazole (PRILOSEC) 20 MG capsule Take 1 capsule (20 mg total) by mouth daily. 09/15/16  Yes Crecencio Mc, MD  Precision Thin Lancets MISC Use as directed to check sugars twice daily 01/27/12  Yes Crecencio Mc, MD  simvastatin (ZOCOR) 40 MG tablet Take 1 tablet (40 mg total) daily by mouth. 11/10/16  Yes Crecencio Mc, MD  vitamin E 400 UNIT capsule Take 400  Units by mouth daily.     Yes [provider]  albuterol (PROAIR HFA) 108 (90 Base) MCG/ACT inhaler Inhale into the lungs.    [provider]  Calcium Carb-Cholecalciferol 600-100 MG-UNIT CAPS Take 1 tablet by mouth daily. Two by mouth daily Patient taking differently: Take 1 tablet by mouth daily.  11/07/12   Crecencio Mc, MD  HYDROcodone-acetaminophen (NORCO/VICODIN) 5-325 MG tablet Take by mouth. 05/28/15   [provider]  penciclovir (DENAVIR) 1 % cream Apply 1 application topically every 2 (two) hours. Patient not taking: Reported on 07/31/2017 08/26/15   Crecencio Mc, MD    Allergies as of 07/18/2017  . (No Known Allergies)    Family History  Problem Relation Age of Onset  . Heart Problems Mother   . Macular degeneration Mother   . Cancer Father   . Cancer Maternal Grandmother        colon ca  . Diabetes Sister   . Diabetes Brother   . Stroke Brother   . Diabetes Brother   . Breast cancer Maternal Aunt   . Breast cancer Paternal Aunt   . Lung disease Neg Hx   . Rheumatologic disease Neg Hx     Social History   Socioeconomic History  . Marital status: Married    Spouse name: Not on file  . Number of children: Not on file  . Years of education: Not on file  . Highest education level: Not on file  Occupational History  . Not on file  Social Needs  . Financial resource strain: Not on file  . Food insecurity:    Worry: Not on file    Inability: Not on file  . Transportation needs:    Medical: Not on file    Non-medical: Not on file  Tobacco Use  . Smoking status: Never Smoker  . Smokeless tobacco: Never Used  . Tobacco comment: father growing up  Substance and Sexual Activity  . Alcohol use: Yes    Alcohol/week: 3.0 oz    Types: 5 Glasses of wine per week    Comment: Occasional  . Drug use: No  . Sexual activity: Yes    Birth control/protection: Post-menopausal  Lifestyle  . Physical activity:    Days per week: Not on file      Minutes per session: Not on file  . Stress: Not on file  Relationships  . Social connections:    Talks on phone: Not on file    Gets together: Not on file    Attends religious service: Not on file    Active member of club or organization: Not on file    Attends meetings of clubs or organizations: Not on file    Relationship status:  Not on file  . Intimate partner violence:    Fear of current or ex partner: Not on file    Emotionally abused: Not on file    Physically abused: Not on file    Forced sexual activity: Not on file  Other Topics Concern  . Not on file  Social History Narrative   [facility] (11/14/16):   She is originally from [place]. Previously has worked as a Network engineer, in [employer], & in [employer]. Her husband was in [employer] and they traveled extensively. She lived in [place], [place], [place], [place], [place], [place]. She has 2 cats currently. Remote parakeet exposure as a child. No mold exposure. No recent hot tub exposure.     Review of Systems: See HPI, otherwise negative ROS  Physical Exam: BP 139/82   Pulse 73   Temp (!) 94.5 F (34.7 C) (Tympanic)   Resp 18   Ht 4\' 11"  (1.499 m)   Wt 132 lb (59.9 kg)   SpO2 96%   BMI 26.66 kg/m  General:   Alert,  pleasant and cooperative in NAD Head:  Normocephalic and atraumatic. Neck:  Supple; no masses or thyromegaly. Lungs:  Clear throughout to auscultation.    Heart:  Regular rate and rhythm. Abdomen:  Soft, nontender and nondistended. Normal bowel sounds, without guarding, and without rebound.   Neurologic:  Alert and  oriented x4;  grossly normal neurologically.  Impression/Plan: [name] is here for an flexible sigmoidoscopy to be performed for anastomotic stricture  Risks, benefits, limitations, and alternatives regarding  flexible sigmoidoscopy have been reviewed with the patient.  Questions have been answered.  All parties agreeable.   Sherri Sear, MD  07/31/2017, 11:09 AM

## 2017-07-31 NOTE — Anesthesia Postprocedure Evaluation (Signed)
Anesthesia Post Note  Patient: Emily Ryan  Procedure(s) Performed: FLEXIBLE SIGMOIDOSCOPY (N/A )  Patient location during evaluation: Endoscopy Anesthesia Type: General Level of consciousness: awake and alert Pain management: pain level controlled Vital Signs Assessment: post-procedure vital signs reviewed and stable Respiratory status: spontaneous breathing, nonlabored ventilation, respiratory function stable and patient connected to nasal cannula oxygen Cardiovascular status: blood pressure returned to baseline and stable Postop Assessment: no apparent nausea or vomiting Anesthetic complications: no     Last Vitals:  Vitals:   07/31/17 1312 07/31/17 1322  BP: 115/76 (!) 149/71  Pulse: 66   Resp: 15   Temp:    SpO2: 99%     Last Pain:  Vitals:   07/31/17 1332  TempSrc:   PainSc: 0-No pain                 Cabot Cromartie S

## 2017-08-01 ENCOUNTER — Other Ambulatory Visit (INDEPENDENT_AMBULATORY_CARE_PROVIDER_SITE_OTHER): Payer: Medicare Other

## 2017-08-01 DIAGNOSIS — I10 Essential (primary) hypertension: Secondary | ICD-10-CM | POA: Diagnosis not present

## 2017-08-01 DIAGNOSIS — E119 Type 2 diabetes mellitus without complications: Secondary | ICD-10-CM | POA: Diagnosis not present

## 2017-08-01 LAB — COMPREHENSIVE METABOLIC PANEL
ALT: 31 U/L (ref 0–35)
AST: 26 U/L (ref 0–37)
Albumin: 4.1 g/dL (ref 3.5–5.2)
Alkaline Phosphatase: 65 U/L (ref 39–117)
BUN: 11 mg/dL (ref 6–23)
CALCIUM: 9.5 mg/dL (ref 8.4–10.5)
CHLORIDE: 101 meq/L (ref 96–112)
CO2: 28 mEq/L (ref 19–32)
Creatinine, Ser: 0.74 mg/dL (ref 0.40–1.20)
GFR: 81.82 mL/min (ref >60.00)
Glucose, Bld: 103 mg/dL — ABNORMAL HIGH (ref 70–99)
POTASSIUM: 3.9 meq/L (ref 3.5–5.1)
Sodium: 137 mEq/L (ref 135–145)
TOTAL PROTEIN: 7 g/dL (ref 6.0–8.3)
Total Bilirubin: 0.7 mg/dL (ref 0.2–1.2)

## 2017-08-01 LAB — HEMOGLOBIN A1C: HEMOGLOBIN A1C: 6.8 % — AB (ref 4.6–6.5)

## 2017-08-03 ENCOUNTER — Ambulatory Visit (INDEPENDENT_AMBULATORY_CARE_PROVIDER_SITE_OTHER): Payer: Medicare Other | Admitting: Internal Medicine

## 2017-08-03 ENCOUNTER — Encounter: Payer: Self-pay | Admitting: Internal Medicine

## 2017-08-03 VITALS — BP 136/70 | HR 89 | Temp 98.2°F | Resp 17 | Ht 59.0 in | Wt 134.4 lb

## 2017-08-03 DIAGNOSIS — E119 Type 2 diabetes mellitus without complications: Secondary | ICD-10-CM

## 2017-08-03 DIAGNOSIS — L404 Guttate psoriasis: Secondary | ICD-10-CM

## 2017-08-03 DIAGNOSIS — Z9049 Acquired absence of other specified parts of digestive tract: Secondary | ICD-10-CM | POA: Diagnosis not present

## 2017-08-03 DIAGNOSIS — Z79899 Other long term (current) drug therapy: Secondary | ICD-10-CM | POA: Diagnosis not present

## 2017-08-03 DIAGNOSIS — E7800 Pure hypercholesterolemia, unspecified: Secondary | ICD-10-CM

## 2017-08-03 DIAGNOSIS — D126 Benign neoplasm of colon, unspecified: Secondary | ICD-10-CM

## 2017-08-03 DIAGNOSIS — E559 Vitamin D deficiency, unspecified: Secondary | ICD-10-CM | POA: Diagnosis not present

## 2017-08-03 DIAGNOSIS — I10 Essential (primary) hypertension: Secondary | ICD-10-CM | POA: Diagnosis not present

## 2017-08-03 LAB — SURGICAL PATHOLOGY

## 2017-08-03 MED ORDER — LISINOPRIL 40 MG PO TABS: 40.0000 mg | ORAL_TABLET | Freq: Every day | ORAL | 3 refills | Status: DC

## 2017-08-03 MED ORDER — PENCICLOVIR 1 % EX CREA: 1.0000 "application " | TOPICAL_CREAM | CUTANEOUS | 1 refills | Status: DC

## 2017-08-03 MED ORDER — MONTELUKAST SODIUM 10 MG PO TABS: 10.0000 mg | ORAL_TABLET | Freq: Every day | ORAL | 3 refills | Status: DC

## 2017-08-03 MED ORDER — DULAGLUTIDE 0.75 MG/0.5ML ~~LOC~~ SOAJ
0.7500 mg | SUBCUTANEOUS | 3 refills | Status: DC
Start: 2017-08-03 — End: 2018-10-02

## 2017-08-03 MED ORDER — LORATADINE 10 MG PO TABS: 10.0000 mg | ORAL_TABLET | Freq: Every day | ORAL | 3 refills | Status: DC

## 2017-08-03 MED ORDER — GLUCOSE BLOOD VI STRP: ORAL_STRIP | 3 refills | Status: DC

## 2017-08-03 MED ORDER — OMEPRAZOLE 20 MG PO CPDR: 20.0000 mg | DELAYED_RELEASE_CAPSULE | Freq: Every day | ORAL | 3 refills | Status: DC

## 2017-08-03 NOTE — Patient Instructions (Addendum)
Your diabetes is under excellent control currently. You have lowered your a1c  from 7.0 to 6.8 .     Please plan to return in  4  months for follow up on diabetes.     Fasting labs should done a day or two prior to visit so we can discuss them at your visit. Also, if you have not done so,  make sure you are seeing your eye doctor at least once a year.  I have increased the lisinopril to 40 mg daily for a goal of 120/70 or less .  It it goes too low (< 110)  Reduce the dose to 1/2 tablet

## 2017-08-03 NOTE — Progress Notes (Signed)
Subjective:  Patient ID: Emily Ryan, female    DOB: [birth year]  Age: 72 y.o. MRN: [id number]  CC: The primary encounter diagnosis was Pure hypercholesterolemia. Diagnoses of Diabetes mellitus without complication ([place]), Vitamin D deficiency, Long-term use of high-risk medication, Guttate psoriasis, Essential hypertension, and S/P partial colectomy were also pertinent to this visit.  HPI Emily Ryan presents for 4 month follow up on diabetes.  Patient has no complaints today.  Patient is following a low glycemic index diet and about 505 of the time and  taking all prescribed medications regularly without side effects.  Fasting sugars have been under less than 140 most of the time and post prandials have been under 160 except on rare occasions. Patient is exercising about 5 times per week and intentionally trying to lose weight .  Patient has had an eye exam in the last 12 months and checks feet regularly for signs of infection.  Patient does not walk barefoot outside,  And denies an numbness tingling or burning in feet. Patient is up to date on all recommended vaccinations  Had first shingles vaccine; tolerated with  no reaction  Had ultrasound of liver due to concern for levier toxicity from cumulative dose of MTX therapy.  [name] was done. The procedure was done on June 7  A formal report of the results is  not in EPIC .   During the evaluation [name].reviewed her colon cancer screening history which is not up to date due to history of sigmoid colon stenosis resulting from diverticular perforation  Requiring a  partial colectomy .   An incomplete colonoscopy in July 2015 occurred,  and a  CT virtual colonscopy had been done instead by prior gastroenteroogist [name]  Which noted mucosal thickening of the sigmoid wall.  [name] made two attempts to pass a scope.  On her second scheduled attempt several polyps were retrieved but full visualization was again prevented due to stenosis  /stricture.  A repeat evaluation is planned in  3 months.    Outpatient Medications Prior to Visit  Medication Sig Dispense Refill  . Ascorbic Acid (VITAMIN C) 1000 MG tablet Take 1,000 mg by mouth.    Marland Kitchen aspirin 81 MG chewable tablet Chew 81 mg by mouth daily.     . Calcium Carb-Cholecalciferol 600-100 MG-UNIT CAPS Take 1 tablet by mouth daily. Two by mouth daily (Patient taking differently: Take 1 tablet by mouth daily. ) 90 capsule 3  . calcium carbonate (CALCIUM 600) 600 MG TABS tablet Take 600 mg by mouth.    . escitalopram (LEXAPRO) 5 MG tablet Take 1 tablet (5 mg total) by mouth daily. 90 tablet 3  . ezetimibe (ZETIA) 10 MG tablet Take 1 tablet (10 mg total) daily by mouth. 90 tablet 3  . folic acid (FOLVITE) 1 MG tablet Take 1 mg by mouth daily.    Marland Kitchen glipiZIDE (GLUCOTROL) 5 MG tablet Take 1 tablet (5 mg total) by mouth 2 (two) times daily before a meal. 180 tablet 3  . hydrochlorothiazide (HYDRODIURIL) 12.5 MG tablet Take 1 tablet (12.5 mg total) by mouth daily. 90 tablet 3  . HYDROcodone-acetaminophen (NORCO/VICODIN) 5-325 MG tablet Take by mouth.    . metFORMIN (GLUCOPHAGE XR) 750 MG 24 hr tablet Take 1 tablet (750 mg total) by mouth daily with breakfast. 90 tablet 3  . methotrexate (RHEUMATREX) 2.5 MG tablet Take 7.5 mg by mouth every Saturday.     . Multiple Vitamin (MULTIVITAMIN) capsule Take 1  capsule by mouth daily.      . Precision Thin Lancets MISC Use as directed to check sugars twice daily 180 each 3  . simvastatin (ZOCOR) 40 MG tablet Take 1 tablet (40 mg total) daily by mouth. 90 tablet 3  . vitamin E 400 UNIT capsule Take 400 Units by mouth daily.      . Dulaglutide (TRULICITY) 3.14 HF/0.2OV SOPN Inject 0.75 mg once a week into the skin. 13 pen 3  . glucose blood (FREESTYLE LITE) test strip Use as instructed to test blood sugar twice daily 200 each 3  . lisinopril (PRINIVIL,ZESTRIL) 20 MG tablet Take 1 tablet (20 mg total) by mouth daily. 90 tablet 3  . loratadine  (CLARITIN) 10 MG tablet Take 1 tablet (10 mg total) by mouth daily. 90 tablet 3  . montelukast (SINGULAIR) 10 MG tablet Take 1 tablet (10 mg total) by mouth at bedtime. 90 tablet 3  . omeprazole (PRILOSEC) 20 MG capsule Take 1 capsule (20 mg total) by mouth daily. 90 capsule 3  . penciclovir (DENAVIR) 1 % cream Apply 1 application topically every 2 (two) hours. 1.5 g 1  . albuterol (PROAIR HFA) 108 (90 Base) MCG/ACT inhaler Inhale into the lungs.    Marland Kitchen esomeprazole (NEXIUM) 20 MG packet Take 20 mg by mouth.     No facility-administered medications prior to visit.     Review of Systems;  Patient denies headache, fevers, malaise, unintentional weight loss, skin rash, eye pain, sinus congestion and sinus pain, sore throat, dysphagia,  hemoptysis , cough, dyspnea, wheezing, chest pain, palpitations, orthopnea, edema, abdominal pain, nausea, melena, diarrhea, constipation, flank pain, dysuria, hematuria, urinary  Frequency, nocturia, numbness, tingling, seizures,  Focal weakness, Loss of consciousness,  Tremor, insomnia, depression, anxiety, and suicidal ideation.      Objective:  BP 136/70 (BP Location: Left Arm, Patient Position: Sitting, Cuff Size: Normal)   Pulse 89   Temp 98.2 F (36.8 C) (Oral)   Resp 17   Ht 4\' 11"  (1.499 m)   Wt 134 lb 6.4 oz (61 kg)   SpO2 96%   BMI 27.15 kg/m   BP Readings from Last 3 Encounters:  08/03/17 136/70  07/31/17 (!) 149/71  07/17/17 (!) 149/66    Wt Readings from Last 3 Encounters:  08/03/17 134 lb 6.4 oz (61 kg)  07/31/17 132 lb (59.9 kg)  07/17/17 132 lb (59.9 kg)    General appearance: alert, cooperative and appears stated age Ears: normal TM's and external ear canals both ears Throat: lips, mucosa, and tongue normal; teeth and gums normal Neck: no adenopathy, no carotid bruit, supple, symmetrical, trachea midline and thyroid not enlarged, symmetric, no tenderness/mass/nodules Back: symmetric, no curvature. ROM normal. No CVA  tenderness. Lungs: clear to auscultation bilaterally Heart: regular rate and rhythm, S1, S2 normal, no murmur, click, rub or gallop Abdomen: soft, non-tender; bowel sounds normal; no masses,  no organomegaly Pulses: 2+ and symmetric Skin: Skin color, texture, turgor normal. No rashes or lesions Lymph nodes: Cervical, supraclavicular, and axillary nodes normal.  Lab Results  Component Value Date   HGBA1C 6.8 (H) 08/01/2017   HGBA1C 7.0 (H) 03/29/2017   HGBA1C 6.7 11/10/2016    Lab Results  Component Value Date   CREATININE 0.74 08/01/2017   CREATININE 0.65 03/29/2017   CREATININE 0.69 11/07/2016    Lab Results  Component Value Date   WBC 7.3 03/29/2017   HGB 13.9 03/29/2017   HCT 41.2 03/29/2017   PLT 263.0 03/29/2017  GLUCOSE 103 (H) 08/01/2017   CHOL 132 03/29/2017   TRIG 72.0 03/29/2017   HDL 55.20 03/29/2017   LDLDIRECT 104.0 05/04/2015   LDLCALC 62 03/29/2017   ALT 31 08/01/2017   AST 26 08/01/2017   NA 137 08/01/2017   K 3.9 08/01/2017   CL 101 08/01/2017   CREATININE 0.74 08/01/2017   BUN 11 08/01/2017   CO2 28 08/01/2017   TSH 1.62 02/04/2015   HGBA1C 6.8 (H) 08/01/2017   MICROALBUR 0.9 03/29/2017    No results found.  Assessment & Plan:   Problem List Items Addressed This Visit    Essential hypertension    HierBP is not at goal currently.  Medications reviewed and compliance assessed via patient report.  Renal function and electolytes assessed.  Use of NSAIDs, oral decongestants and other medications/supplements reviewed . the following changes were made to hierregimen: lisinopril dose was  increased to 40 mg daily .  Lab Results  Component Value Date   CREATININE 0.74 08/01/2017   Lab Results  Component Value Date   NA 137 08/01/2017   K 3.9 08/01/2017   CL 101 08/01/2017   CO2 28 08/01/2017         Relevant Medications   lisinopril (PRINIVIL,ZESTRIL) 40 MG tablet   Hyperlipidemia - Primary   Relevant Medications   lisinopril  (PRINIVIL,ZESTRIL) 40 MG tablet   Other Relevant Orders   Lipid panel   Guttate psoriasis    In remission with 7.5 mg weekly dose of MT.  Lower doses have caused relapse.       Long-term use of high-risk medication    Evaluation of liver parenchyma by [name] with Fibroscan with elastrograph was done in May      Diabetes mellitus without complication ([place])    Improving control with better adherence ot low GI diet. No changes today .   Lab Results  Component Value Date   HGBA1C 6.8 (H) 08/01/2017  ` Lab Results  Component Value Date   MICROALBUR 0.9 03/29/2017         Relevant Medications   Dulaglutide (TRULICITY) 1.61 WR/6.0AV SOPN   lisinopril (PRINIVIL,ZESTRIL) 40 MG tablet   Other Relevant Orders   Hemoglobin A1c   Comprehensive metabolic panel   Microalbumin / creatinine urine ratio   S/P partial colectomy    Remotely Secondary to diverticular perforation.  Last complete colonoscopy was In 2005        Other Visit Diagnoses    Vitamin D deficiency       Relevant Orders   VITAMIN D 25 Hydroxy (Vit-D Deficiency, Fractures)      I have discontinued [name] A. Straw's lisinopril, esomeprazole, and albuterol. I have also changed her Dulaglutide. Additionally, I am having her start on lisinopril. Lastly, I am having her maintain her vitamin E, multivitamin, methotrexate, PRECISION THIN LANCETS, folic acid, Calcium Carb-Cholecalciferol, aspirin, glipiZIDE, metFORMIN, simvastatin, ezetimibe, hydrochlorothiazide, escitalopram, HYDROcodone-acetaminophen, calcium carbonate, vitamin C, glucose blood, omeprazole, loratadine, montelukast, and penciclovir.  Meds ordered this encounter  Medications  . DISCONTD: penciclovir (DENAVIR) 1 % cream    Sig: Apply 1 application topically every 2 (two) hours.    Dispense:  1.5 g    Refill:  1  . glucose blood (FREESTYLE LITE) test strip    Sig: Use as instructed to test blood sugar twice daily    Dispense:  200 each    Refill:  3     Please dispense 90 day supply  . omeprazole (PRILOSEC) 20 MG  capsule    Sig: Take 1 capsule (20 mg total) by mouth daily.    Dispense:  90 capsule    Refill:  3  . loratadine (CLARITIN) 10 MG tablet    Sig: Take 1 tablet (10 mg total) by mouth daily.    Dispense:  90 tablet    Refill:  3  . montelukast (SINGULAIR) 10 MG tablet    Sig: Take 1 tablet (10 mg total) by mouth at bedtime.    Dispense:  90 tablet    Refill:  3  . Dulaglutide (TRULICITY) 0.35 WS/5.6CL SOPN    Sig: Inject 0.75 mg into the skin once a week.    Dispense:  13 pen    Refill:  3    90 day supply please  . lisinopril (PRINIVIL,ZESTRIL) 40 MG tablet    Sig: Take 1 tablet (40 mg total) by mouth daily.    Dispense:  90 tablet    Refill:  3  . penciclovir (DENAVIR) 1 % cream    Sig: Apply 1 application topically every 2 (two) hours.    Dispense:  1.5 g    Refill:  1    Medications Discontinued During This Encounter  Medication Reason  . albuterol (PROAIR HFA) 108 (90 Base) MCG/ACT inhaler Patient has not taken in last 30 days  . esomeprazole (NEXIUM) 20 MG packet Change in therapy  . penciclovir (DENAVIR) 1 % cream Reorder  . glucose blood (FREESTYLE LITE) test strip Reorder  . omeprazole (PRILOSEC) 20 MG capsule Reorder  . loratadine (CLARITIN) 10 MG tablet Reorder  . montelukast (SINGULAIR) 10 MG tablet Reorder  . Dulaglutide (TRULICITY) 2.75 TZ/0.0FV SOPN Reorder  . lisinopril (PRINIVIL,ZESTRIL) 20 MG tablet Change in therapy  . penciclovir (DENAVIR) 1 % cream     Follow-up: Return in about 4 months (around 12/03/2017).   Crecencio Mc, MD

## 2017-08-05 DIAGNOSIS — Z9049 Acquired absence of other specified parts of digestive tract: Secondary | ICD-10-CM | POA: Insufficient documentation

## 2017-08-05 NOTE — Assessment & Plan Note (Signed)
Remotely Secondary to diverticular perforation.  Last complete colonoscopy was In 2005

## 2017-08-05 NOTE — Assessment & Plan Note (Signed)
Improving control with better adherence ot low GI diet. No changes today .   Lab Results  Component Value Date   HGBA1C 6.8 (H) 08/01/2017  ` Lab Results  Component Value Date   MICROALBUR 0.9 03/29/2017

## 2017-08-05 NOTE — Assessment & Plan Note (Signed)
Evaluation of liver parenchyma by [name] with Fibroscan with elastrograph was done in May

## 2017-08-05 NOTE — Assessment & Plan Note (Signed)
In remission with 7.5 mg weekly dose of MT.  Lower doses have caused relapse.

## 2017-08-05 NOTE — Assessment & Plan Note (Signed)
HierBP is not at goal currently.  Medications reviewed and compliance assessed via patient report.  Renal function and electolytes assessed.  Use of NSAIDs, oral decongestants and other medications/supplements reviewed . the following changes were made to hierregimen: lisinopril dose was  increased to 40 mg daily .  Lab Results  Component Value Date   CREATININE 0.74 08/01/2017   Lab Results  Component Value Date   NA 137 08/01/2017   K 3.9 08/01/2017   CL 101 08/01/2017   CO2 28 08/01/2017

## 2017-08-09 ENCOUNTER — Other Ambulatory Visit: Payer: Self-pay

## 2017-08-09 DIAGNOSIS — Z86010 Personal history of colonic polyps: Secondary | ICD-10-CM

## 2017-08-28 ENCOUNTER — Ambulatory Visit: Payer: Medicare Other

## 2017-09-06 ENCOUNTER — Ambulatory Visit (INDEPENDENT_AMBULATORY_CARE_PROVIDER_SITE_OTHER): Payer: Medicare Other

## 2017-09-06 VITALS — BP 130/70 | HR 74 | Temp 98.7°F | Resp 15 | Ht 59.5 in | Wt 135.4 lb

## 2017-09-06 DIAGNOSIS — Z0000 Encounter for general adult medical examination without abnormal findings: Secondary | ICD-10-CM

## 2017-09-06 NOTE — Progress Notes (Addendum)
Subjective:   Emily Ryan is a 72 y.o. female who presents for Medicare Annual (Subsequent) preventive examination.  Review of Systems:  No ROS.  Medicare Wellness Visit. Additional risk factors are reflected in the social history.  Cardiac Risk Factors include: hypertension;advanced age (>57men, >61 women)     Objective:     Vitals: BP 130/70 (BP Location: Left Arm, Patient Position: Sitting, Cuff Size: Normal)   Pulse 74   Temp 98.7 F (37.1 C) (Oral)   Resp 15   Ht 4' 11.5" (1.511 m)   Wt 135 lb 6.4 oz (61.4 kg)   SpO2 95%   BMI 26.89 kg/m   Body mass index is 26.89 kg/m.  Advanced Directives 09/06/2017 07/31/2017 08/25/2016 11/24/2015 08/26/2015  Does Patient Have a Medical Advance Directive? No No No No No  Does patient want to make changes to medical advance directive? - - Yes (MAU/Ambulatory/Procedural Areas - Information given) - -  Would patient like information on creating a medical advance directive? Yes (MAU/Ambulatory/Procedural Areas - Information given) No - Patient declined - No - patient declined information Yes Higher education careers adviser given    Tobacco Social History   Tobacco Use  Smoking Status Never Smoker  Smokeless Tobacco Never Used  Tobacco Comment   father growing up     Counseling given: Not Answered Comment: father growing up   Clinical Intake:  Pre-visit preparation completed: Yes  Pain : No/denies pain     Nutritional Status: BMI 25 -29 Overweight Diabetes: Yes(Followed by pcp)  How often do you need to have someone help you when you read instructions, pamphlets, or other written materials from your doctor or pharmacy?: 1 - Never  Interpreter Needed?: No     Past Medical History:  Diagnosis Date  . Complicated pregnancy    1st pregnancy complicated by post operative hemorrhage and 2ng complicated by epidural  . Diabetes mellitus   . Diverticulosis of colon (without mention of hemorrhage)   . Guttate psoriasis   .  Hyperlipidemia   . Hypertension   . Menopausal disorder    Past Surgical History:  Procedure Laterality Date  . ABDOMINAL HYSTERECTOMY    . APPENDECTOMY  Dec 2012  . BLADDER REPAIR  1991   lift   . COLONOSCOPY WITH PROPOFOL N/A 07/17/2017   Procedure: COLONOSCOPY WITH PROPOFOL;  Surgeon: Lin Landsman, MD;  Location: [facility] ENDOSCOPY;  Service: Gastroenterology;  Laterality: N/A;  . CYSTOCELE REPAIR    . FLEXIBLE SIGMOIDOSCOPY N/A 07/31/2017   Procedure: FLEXIBLE SIGMOIDOSCOPY;  Surgeon: Lin Landsman, MD;  Location: [facility] ENDOSCOPY;  Service: Gastroenterology;  Laterality: N/A;  . HERNIA REPAIR    . MOHS SURGERY N/A 05/2015   Face  . PARTIAL COLECTOMY  Nov 2012   left, secondary to diverticular perf  . RECTOCELE REPAIR    . SEPTOPLASTY  1969  . TEE WITHOUT CARDIOVERSION N/A 11/24/2015   Procedure: TRANSESOPHAGEAL ECHOCARDIOGRAM (TEE);  Surgeon: Minna Merritts, MD;  Location: [facility] ORS;  Service: Cardiovascular;  Laterality: N/A;  . TONSILLECTOMY  1953  . VARICOSE VEIN SURGERY  1974   right leg    Family History  Problem Relation Age of Onset  . Heart Problems Mother   . Macular degeneration Mother   . Cancer Father   . Cancer Maternal Grandmother        colon ca  . Diabetes Sister   . Dementia Sister   . Diabetes Brother   . Stroke Brother   .  Diabetes Brother   . Breast cancer Maternal Aunt   . Breast cancer Paternal Aunt   . Lung cancer Cousin   . Lung disease Neg Hx   . Rheumatologic disease Neg Hx    Social History   Socioeconomic History  . Marital status: Married    Spouse name: Not on file  . Number of children: Not on file  . Years of education: Not on file  . Highest education level: Not on file  Occupational History  . Not on file  Social Needs  . Financial resource strain: Not hard at all  . Food insecurity:    Worry: Never true    Inability: Never true  . Transportation needs:    Medical: No    Non-medical: No  Tobacco Use  .  Smoking status: Never Smoker  . Smokeless tobacco: Never Used  . Tobacco comment: father growing up  Substance and Sexual Activity  . Alcohol use: Yes    Alcohol/week: 5.0 standard drinks    Types: 5 Glasses of wine per week    Comment: Occasional  . Drug use: No  . Sexual activity: Yes    Birth control/protection: Post-menopausal  Lifestyle  . Physical activity:    Days per week: 4 days    Minutes per session: 90 min  . Stress: Not at all  Relationships  . Social connections:    Talks on phone: Not on file    Gets together: Not on file    Attends religious service: Not on file    Active member of club or organization: Not on file    Attends meetings of clubs or organizations: Not on file    Relationship status: Married  Other Topics Concern  . Not on file  Social History Narrative   [facility] (11/14/16):   She is originally from [place]. Previously has worked as a Network engineer, in [employer], & in [employer]. Her husband was in [employer] and they traveled extensively. She lived in [place], [place], [place], [place], [place], [place]. She has 2 cats currently. Remote parakeet exposure as a child. No mold exposure. No recent hot tub exposure.     Outpatient Encounter Medications as of 09/06/2017  Medication Sig  . Ascorbic Acid (VITAMIN C) 1000 MG tablet Take 1,000 mg by mouth.  Marland Kitchen aspirin 81 MG chewable tablet Chew 81 mg by mouth daily.   . Calcium Carb-Cholecalciferol 600-100 MG-UNIT CAPS Take 1 tablet by mouth daily. Two by mouth daily (Patient taking differently: Take 1 tablet by mouth daily. )  . calcium carbonate (CALCIUM 600) 600 MG TABS tablet Take 600 mg by mouth.  . Dulaglutide (TRULICITY) 2.99 BZ/1.6RC SOPN Inject 0.75 mg into the skin once a week.  . escitalopram (LEXAPRO) 5 MG tablet Take 1 tablet (5 mg total) by mouth daily.  Marland Kitchen ezetimibe (ZETIA) 10 MG tablet Take 1 tablet (10 mg total) daily by mouth.  . folic acid (FOLVITE) 1 MG tablet Take 1 mg by mouth daily.  Marland Kitchen glipiZIDE  (GLUCOTROL) 5 MG tablet Take 1 tablet (5 mg total) by mouth 2 (two) times daily before a meal.  . glucose blood (FREESTYLE LITE) test strip Use as instructed to test blood sugar twice daily  . hydrochlorothiazide (HYDRODIURIL) 12.5 MG tablet Take 1 tablet (12.5 mg total) by mouth daily.  Marland Kitchen HYDROcodone-acetaminophen (NORCO/VICODIN) 5-325 MG tablet Take by mouth.  Marland Kitchen lisinopril (PRINIVIL,ZESTRIL) 40 MG tablet Take 1 tablet (40 mg total) by mouth daily.  Marland Kitchen  loratadine (CLARITIN) 10 MG tablet Take 1 tablet (10 mg total) by mouth daily.  . metFORMIN (GLUCOPHAGE XR) 750 MG 24 hr tablet Take 1 tablet (750 mg total) by mouth daily with breakfast.  . methotrexate (RHEUMATREX) 2.5 MG tablet Take 7.5 mg by mouth every Saturday.   . montelukast (SINGULAIR) 10 MG tablet Take 1 tablet (10 mg total) by mouth at bedtime.  . Multiple Vitamin (MULTIVITAMIN) capsule Take 1 capsule by mouth daily.    Marland Kitchen omeprazole (PRILOSEC) 20 MG capsule Take 1 capsule (20 mg total) by mouth daily.  Marland Kitchen penciclovir (DENAVIR) 1 % cream Apply 1 application topically every 2 (two) hours.  . Precision Thin Lancets MISC Use as directed to check sugars twice daily  . simvastatin (ZOCOR) 40 MG tablet Take 1 tablet (40 mg total) daily by mouth.  . vitamin E 400 UNIT capsule Take 400 Units by mouth daily.     No facility-administered encounter medications on file as of 09/06/2017.     Activities of Daily Living In your present state of health, do you have any difficulty performing the following activities: 09/06/2017  Hearing? N  Vision? N  Difficulty concentrating or making decisions? N  Walking or climbing stairs? N  Dressing or bathing? N  Doing errands, shopping? N  Preparing Food and eating ? N  Using the Toilet? N  In the past six months, have you accidently leaked urine? Y  Comment Managed with a daily pad  Do you have problems with loss of bowel control? N  Managing your Medications? N  Managing your Finances? N  Housekeeping  or managing your Housekeeping? N  Some recent data might be hidden    Patient Care Team: Crecencio Mc, MD as PCP - General (Internal Medicine) Minna Merritts, MD as Consulting Physician (Cardiology)    Assessment:   This is a routine wellness examination for [place].  The goal of the wellness visit is to assist the patient how to close the gaps in care and create a preventative care plan for the patient.   The roster of all physicians providing medical care to patient is listed in the Snapshot section of the chart.  Taking calcium VIT D as appropriate/Osteoporosis risk reviewed.    Safety issues reviewed; Smoke and carbon monoxide detectors in the home. No firearms in the home. Wears seatbelts when driving or riding with others. No violence in the home.  They do not have excessive sun exposure.  Discussed the need for sun protection: hats, long sleeves and the use of sunscreen if there is significant sun exposure.  Patient is alert, normal appearance, oriented to person/place/and time.  Correctly identified the president of the [place] and recalls of 3/3 words. Performs simple calculations and can read correct time from watch face.  Displays appropriate judgement.  No new identified risk were noted.  No failures at ADL's or IADL's.    BMI- discussed the importance of a healthy diet, water intake and the benefits of aerobic exercise.    24 hour diet recall: Regular diet  Dental- every 6 months.  Eye- Visual acuity not assessed per patient preference since they have regular follow up with the ophthalmologist.  Wears corrective lenses.  Sleep patterns- Sleeps 6-7 hours at night.  Wakes feeling rested.   Influenza vaccine discussed. She plans to receive later in the season.   Patient Concerns: None at this time. Follow up with PCP as needed.  Exercise Activities and Dietary recommendations Current Exercise Habits:  Structured exercise class, Type of exercise:  stretching;walking;yoga;strength training/weights;calisthenics, Time (Minutes): 30, Frequency (Times/Week): 4, Weekly Exercise (Minutes/Week): 120  Goals      Patient Stated   . DIET - INCREASE LEAN PROTEINS (pt-stated)     Low carb foods Reduce sugar intake Lose 10-15lb       Fall Risk Fall Risk  09/06/2017 08/25/2016 08/26/2015 02/06/2015 01/28/2014  Falls in the past year? No No Yes No No  Number falls in past yr: - - 1 - -  Injury with Fall? - - No - -  Comment - - Tripped over an item on the floor - -  Follow up - - Education provided;Falls prevention discussed - -   Depression Screen PHQ 2/9 Scores 09/06/2017 08/25/2016 08/26/2015 02/06/2015  PHQ - 2 Score 0 0 0 0  PHQ- 9 Score - 0 - -     Cognitive Function MMSE - Mini Mental State Exam 09/06/2017 08/25/2016 08/26/2015  Orientation to time 5 5 5   Orientation to Place 5 5 5   Registration 3 3 3   Attention/ Calculation 5 5 5   Recall 3 3 3   Language- name 2 objects 2 2 2   Language- repeat 1 1 1   Language- follow 3 step command 3 3 3   Language- read & follow direction 1 1 1   Write a sentence 1 1 1   Copy design 1 1 1   Total score 30 30 30         Immunization History  Administered Date(s) Administered  . Influenza Split 10/19/2011  . Influenza, High Dose Seasonal PF 10/01/2014, 10/06/2016  . Influenza,inj,Quad PF,6+ Mos 10/31/2012, 09/26/2013, 08/26/2015  . Pneumococcal Conjugate-13 01/28/2013  . Pneumococcal Polysaccharide-23 11/02/2010, 05/04/2016  . Tdap 11/16/2010  . Zoster 10/19/2007   Screening Tests Health Maintenance  Topic Date Due  . INFLUENZA VACCINE  08/03/2017  . FOOT EXAM  08/05/2017  . OPHTHALMOLOGY EXAM  09/27/2017  . HEMOGLOBIN A1C  02/01/2018  . MAMMOGRAM  07/06/2018  . TETANUS/TDAP  11/15/2020  . COLONOSCOPY  08/01/2027  . DEXA SCAN  Completed  . Hepatitis C Screening  Completed  . PNA vac Low Risk Adult  Completed       Plan:    End of life planning; Advance aging; Advanced directives  discussed. Copy of current HCPOA/Living Will requested upon completion.    I have personally reviewed and noted the following in the patient's chart:   . Medical and social history . Use of alcohol, tobacco or illicit drugs  . Current medications and supplements . Functional ability and status . Nutritional status . Physical activity . Advanced directives . List of other physicians . Hospitalizations, surgeries, and ER visits in previous 12 months . Vitals . Screenings to include cognitive, depression, and falls . Referrals and appointments  In addition, I have reviewed and discussed with patient certain preventive protocols, quality metrics, and best practice recommendations. A written personalized care plan for preventive services as well as general preventive health recommendations were provided to patient.     OBrien-Blaney, Torrell Krutz L, LPN  0/09/8117    I have reviewed the above information and agree with above.   Deborra Medina, MD

## 2017-09-06 NOTE — Patient Instructions (Addendum)
  [name] , Thank you for taking time to come for your Medicare Wellness Visit. I appreciate your ongoing commitment to your health goals. Please review the following plan we discussed and let me know if I can assist you in the future.   Follow up with as needed.    Bring a copy of your [place] and/or Living Will to be scanned into chart upon completion.  Have a great day!  These are the goals we discussed: Goals      Patient Stated   . DIET - INCREASE LEAN PROTEINS (pt-stated)     Low carb foods Reduce sugar intake Lose 10-15lb       This is a list of the screening recommended for you and due dates:  Health Maintenance  Topic Date Due  . Flu Shot  08/03/2017  . Complete foot exam   08/05/2017  . Eye exam for diabetics  09/27/2017  . Hemoglobin A1C  02/01/2018  . Mammogram  07/06/2018  . Tetanus Vaccine  11/15/2020  . Colon Cancer Screening  08/01/2027  . DEXA scan (bone density measurement)  Completed  .  Hepatitis C: One time screening is recommended by Center for Disease Control  (CDC) for  adults born from [birth year] through 1966.   Completed  . Pneumonia vaccines  Completed

## 2017-11-01 DIAGNOSIS — L400 Psoriasis vulgaris: Secondary | ICD-10-CM | POA: Diagnosis not present

## 2017-11-01 DIAGNOSIS — C44719 Basal cell carcinoma of skin of left lower limb, including hip: Secondary | ICD-10-CM | POA: Diagnosis not present

## 2017-11-01 DIAGNOSIS — D485 Neoplasm of uncertain behavior of skin: Secondary | ICD-10-CM | POA: Diagnosis not present

## 2017-11-01 DIAGNOSIS — D040 Carcinoma in situ of skin of lip: Secondary | ICD-10-CM | POA: Diagnosis not present

## 2017-11-01 DIAGNOSIS — Z09 Encounter for follow-up examination after completed treatment for conditions other than malignant neoplasm: Secondary | ICD-10-CM | POA: Diagnosis not present

## 2017-11-01 DIAGNOSIS — L821 Other seborrheic keratosis: Secondary | ICD-10-CM | POA: Diagnosis not present

## 2017-11-01 DIAGNOSIS — Z872 Personal history of diseases of the skin and subcutaneous tissue: Secondary | ICD-10-CM | POA: Diagnosis not present

## 2017-11-01 DIAGNOSIS — Z85828 Personal history of other malignant neoplasm of skin: Secondary | ICD-10-CM | POA: Diagnosis not present

## 2017-11-01 DIAGNOSIS — Z08 Encounter for follow-up examination after completed treatment for malignant neoplasm: Secondary | ICD-10-CM | POA: Diagnosis not present

## 2017-11-01 MED ORDER — METFORMIN HCL ER 750 MG PO TB24: 750.0000 mg | ORAL_TABLET | Freq: Every day | ORAL | 3 refills | Status: DC

## 2017-11-01 NOTE — Telephone Encounter (Signed)
Rx has been printed and placed in quick sign folder.  

## 2017-11-02 ENCOUNTER — Encounter: Payer: Self-pay | Admitting: Anesthesiology

## 2017-11-02 ENCOUNTER — Encounter
Admission: RE | Disposition: A | Discharge status: Home / Self Care | Payer: Self-pay | Source: Ambulatory Visit | Attending: Gastroenterology

## 2017-11-02 ENCOUNTER — Ambulatory Visit
Admission: RE | Admit: 2017-11-02 | Discharge: 2017-11-02 | Disposition: A | Discharge status: Home / Self Care | Payer: Medicare Other | Source: Ambulatory Visit | Attending: Gastroenterology | Admitting: Gastroenterology

## 2017-11-02 ENCOUNTER — Ambulatory Visit: Payer: Medicare Other | Admitting: Anesthesiology

## 2017-11-02 ENCOUNTER — Other Ambulatory Visit: Payer: Self-pay

## 2017-11-02 DIAGNOSIS — E785 Hyperlipidemia, unspecified: Secondary | ICD-10-CM | POA: Diagnosis not present

## 2017-11-02 DIAGNOSIS — Z7984 Long term (current) use of oral hypoglycemic drugs: Secondary | ICD-10-CM | POA: Diagnosis not present

## 2017-11-02 DIAGNOSIS — K56699 Other intestinal obstruction unspecified as to partial versus complete obstruction: Secondary | ICD-10-CM | POA: Insufficient documentation

## 2017-11-02 DIAGNOSIS — Z860100 Personal history of colon polyps, unspecified: Secondary | ICD-10-CM

## 2017-11-02 DIAGNOSIS — E119 Type 2 diabetes mellitus without complications: Secondary | ICD-10-CM | POA: Insufficient documentation

## 2017-11-02 DIAGNOSIS — D123 Benign neoplasm of transverse colon: Secondary | ICD-10-CM | POA: Insufficient documentation

## 2017-11-02 DIAGNOSIS — L404 Guttate psoriasis: Secondary | ICD-10-CM | POA: Insufficient documentation

## 2017-11-02 DIAGNOSIS — I10 Essential (primary) hypertension: Secondary | ICD-10-CM | POA: Diagnosis not present

## 2017-11-02 DIAGNOSIS — Z79899 Other long term (current) drug therapy: Secondary | ICD-10-CM | POA: Diagnosis not present

## 2017-11-02 DIAGNOSIS — K644 Residual hemorrhoidal skin tags: Secondary | ICD-10-CM | POA: Diagnosis not present

## 2017-11-02 DIAGNOSIS — K635 Polyp of colon: Secondary | ICD-10-CM | POA: Diagnosis not present

## 2017-11-02 DIAGNOSIS — Z1211 Encounter for screening for malignant neoplasm of colon: Secondary | ICD-10-CM | POA: Diagnosis not present

## 2017-11-02 DIAGNOSIS — K913 Postprocedural intestinal obstruction, unspecified as to partial versus complete: Secondary | ICD-10-CM | POA: Diagnosis not present

## 2017-11-02 DIAGNOSIS — D126 Benign neoplasm of colon, unspecified: Secondary | ICD-10-CM | POA: Diagnosis not present

## 2017-11-02 DIAGNOSIS — K5730 Diverticulosis of large intestine without perforation or abscess without bleeding: Secondary | ICD-10-CM | POA: Diagnosis not present

## 2017-11-02 DIAGNOSIS — Z7982 Long term (current) use of aspirin: Secondary | ICD-10-CM | POA: Diagnosis not present

## 2017-11-02 DIAGNOSIS — K5790 Diverticulosis of intestine, part unspecified, without perforation or abscess without bleeding: Secondary | ICD-10-CM | POA: Diagnosis not present

## 2017-11-02 HISTORY — PX: COLONOSCOPY WITH PROPOFOL: SHX5780

## 2017-11-02 SURGERY — COLONOSCOPY WITH PROPOFOL
Anesthesia: General

## 2017-11-02 MED ORDER — LIDOCAINE HCL (PF) 2 % IJ SOLN
INTRAMUSCULAR | Status: AC
  Filled 2017-11-02: qty 10

## 2017-11-02 MED ORDER — GLYCOPYRROLATE 0.2 MG/ML IJ SOLN
INTRAMUSCULAR | Status: AC
  Filled 2017-11-02: qty 1

## 2017-11-02 MED ORDER — PROPOFOL 500 MG/50ML IV EMUL: INTRAVENOUS | Status: DC | PRN

## 2017-11-02 MED ORDER — PROPOFOL 500 MG/50ML IV EMUL
INTRAVENOUS | Status: AC
  Filled 2017-11-02: qty 50

## 2017-11-02 MED ORDER — PHENYLEPHRINE HCL 10 MG/ML IJ SOLN
INTRAMUSCULAR | Status: DC | PRN
  Administered 2017-11-02 (×2): 100 ug via INTRAVENOUS

## 2017-11-02 MED ORDER — PROPOFOL 500 MG/50ML IV EMUL
INTRAVENOUS | Status: DC | PRN
  Administered 2017-11-02: 170 ug/kg/min via INTRAVENOUS

## 2017-11-02 MED ORDER — FENTANYL CITRATE (PF) 100 MCG/2ML IJ SOLN
INTRAMUSCULAR | Status: DC | PRN
  Administered 2017-11-02: 50 ug via INTRAVENOUS

## 2017-11-02 MED ORDER — FENTANYL CITRATE (PF) 100 MCG/2ML IJ SOLN
INTRAMUSCULAR | Status: AC
  Filled 2017-11-02: qty 2

## 2017-11-02 MED ORDER — PROPOFOL 10 MG/ML IV BOLUS
INTRAVENOUS | Status: DC | PRN
  Administered 2017-11-02: 80 mg via INTRAVENOUS

## 2017-11-02 MED ORDER — PROPOFOL 10 MG/ML IV BOLUS
INTRAVENOUS | Status: AC
  Filled 2017-11-02: qty 20

## 2017-11-02 MED ORDER — SODIUM CHLORIDE 0.9 % IV SOLN
INTRAVENOUS | Status: DC
  Administered 2017-11-02: 08:00:00 via INTRAVENOUS

## 2017-11-02 MED ORDER — LIDOCAINE 2% (20 MG/ML) 5 ML SYRINGE
INTRAMUSCULAR | Status: DC | PRN
  Administered 2017-11-02: 30 mg via INTRAVENOUS

## 2017-11-02 MED ORDER — SODIUM CHLORIDE 0.9 % IV SOLN
INTRAVENOUS | Status: DC | PRN
  Administered 2017-11-02: 09:00:00 via INTRAVENOUS

## 2017-11-02 NOTE — Transfer of Care (Signed)
Immediate Anesthesia Transfer of Care Note  Patient: Emily Ryan  Procedure(s) Performed: COLONOSCOPY WITH PROPOFOL (N/A )  Patient Location: PACU and Endoscopy Unit  Anesthesia Type:General  Level of Consciousness: drowsy  Airway & Oxygen Therapy: Patient Spontanous Breathing and Patient connected to nasal cannula oxygen  Post-op Assessment: Report given to RN and Post -op Vital signs reviewed and stable  Post vital signs: Reviewed and stable  Last Vitals:  Vitals Value Taken Time  BP    Temp    Pulse    Resp    SpO2      Last Pain:  Vitals:   11/02/17 0810  TempSrc: Tympanic  PainSc: 0-No pain         Complications: No apparent anesthesia complications

## 2017-11-02 NOTE — H&P (Signed)
[name], MD [address]  [place]  [place], [place] [address]  Main: [phone]  Fax: [phone] Pager: [phone]  Primary Care Physician:  Crecencio Mc, MD Primary Gastroenterologist:  Dr. [name]  Pre-Procedure History & Physical: HPI:  Emily Ryan is a 72 y.o. female is here for an colonoscopy.   Past Medical History:  Diagnosis Date  . Complicated pregnancy    1st pregnancy complicated by post operative hemorrhage and 2ng complicated by epidural  . Diabetes mellitus   . Diverticulosis of colon (without mention of hemorrhage)   . Guttate psoriasis   . Hyperlipidemia   . Hypertension   . Menopausal disorder     Past Surgical History:  Procedure Laterality Date  . ABDOMINAL HYSTERECTOMY    . APPENDECTOMY  Dec 2012  . BLADDER REPAIR  1991   lift   . COLONOSCOPY WITH PROPOFOL N/A 07/17/2017   Procedure: COLONOSCOPY WITH PROPOFOL;  Surgeon: Lin Landsman, MD;  Location: [facility] ENDOSCOPY;  Service: Gastroenterology;  Laterality: N/A;  . CYSTOCELE REPAIR    . FLEXIBLE SIGMOIDOSCOPY N/A 07/31/2017   Procedure: FLEXIBLE SIGMOIDOSCOPY;  Surgeon: Lin Landsman, MD;  Location: [facility] ENDOSCOPY;  Service: Gastroenterology;  Laterality: N/A;  . HERNIA REPAIR    . MOHS SURGERY N/A 05/2015   Face  . PARTIAL COLECTOMY  Nov 2012   left, secondary to diverticular perf  . RECTOCELE REPAIR    . SEPTOPLASTY  1969  . TEE WITHOUT CARDIOVERSION N/A 11/24/2015   Procedure: TRANSESOPHAGEAL ECHOCARDIOGRAM (TEE);  Surgeon: Minna Merritts, MD;  Location: [facility] ORS;  Service: Cardiovascular;  Laterality: N/A;  . TONSILLECTOMY  1953  . VARICOSE VEIN SURGERY  1974   right leg     Prior to Admission medications   Medication Sig Start Date End Date Taking? Authorizing Provider  Ascorbic Acid (VITAMIN C) 1000 MG tablet Take 1,000 mg by mouth.   Yes [provider]  aspirin 81 MG chewable tablet Chew 81 mg by mouth daily.    Yes [provider]  Calcium Carb-Cholecalciferol 600-100 MG-UNIT CAPS Take 1 tablet by mouth daily. Two by mouth daily Patient taking differently: Take 1 tablet by mouth daily.  11/07/12  Yes Crecencio Mc, MD  calcium carbonate (CALCIUM 600) 600 MG TABS tablet Take 600 mg by mouth.   Yes [provider]  Dulaglutide (TRULICITY) 6.94 WN/4.6EV SOPN Inject 0.75 mg into the skin once a week. 08/03/17  Yes Crecencio Mc, MD  escitalopram (LEXAPRO) 5 MG tablet Take 1 tablet (5 mg total) by mouth daily. 03/03/17  Yes Einar Pheasant, MD  ezetimibe (ZETIA) 10 MG tablet Take 1 tablet (10 mg total) daily by mouth. 11/10/16  Yes Crecencio Mc, MD  folic acid (FOLVITE) 1 MG tablet Take 1 mg by mouth daily.   Yes [provider]  glipiZIDE (GLUCOTROL) 5 MG tablet Take 1 tablet (5 mg total) by mouth 2 (two) times daily before a meal. 09/15/16  Yes Crecencio Mc, MD  glucose blood (FREESTYLE LITE) test strip Use as instructed to test blood sugar twice daily 08/03/17  Yes Crecencio Mc, MD  hydrochlorothiazide (HYDRODIURIL) 12.5 MG tablet Take 1 tablet (12.5 mg total) by mouth daily. 12/15/16  Yes Crecencio Mc, MD  lisinopril (PRINIVIL,ZESTRIL) 40 MG tablet Take 1 tablet (40 mg total) by mouth daily. 08/03/17  Yes Crecencio Mc, MD  loratadine (CLARITIN) 10 MG tablet Take 1 tablet (10 mg total)  by mouth daily. 08/03/17  Yes Crecencio Mc, MD  metFORMIN (GLUCOPHAGE XR) 750 MG 24 hr tablet Take 1 tablet (750 mg total) by mouth daily with breakfast. 11/01/17  Yes Crecencio Mc, MD  methotrexate (RHEUMATREX) 2.5 MG tablet Take 7.5 mg by mouth every Saturday.    Yes [provider]  montelukast (SINGULAIR) 10 MG tablet Take 1 tablet (10 mg total) by mouth at bedtime. 08/03/17  Yes Crecencio Mc, MD  Multiple Vitamin (MULTIVITAMIN) capsule Take 1 capsule by mouth daily.     Yes [provider]  omeprazole (PRILOSEC) 20 MG capsule Take 1 capsule (20 mg total) by mouth daily. 08/03/17   Yes Crecencio Mc, MD  penciclovir (DENAVIR) 1 % cream Apply 1 application topically every 2 (two) hours. 08/03/17  Yes Crecencio Mc, MD  Precision Thin Lancets MISC Use as directed to check sugars twice daily 01/27/12  Yes Crecencio Mc, MD  simvastatin (ZOCOR) 40 MG tablet Take 1 tablet (40 mg total) daily by mouth. 11/10/16  Yes Crecencio Mc, MD  vitamin E 400 UNIT capsule Take 400 Units by mouth daily.     Yes [provider]  HYDROcodone-acetaminophen (NORCO/VICODIN) 5-325 MG tablet Take by mouth. 05/28/15   [provider]    Allergies as of 08/09/2017  . (No Known Allergies)    Family History  Problem Relation Age of Onset  . Heart Problems Mother   . Macular degeneration Mother   . Cancer Father   . Cancer Maternal Grandmother        colon ca  . Diabetes Sister   . Dementia Sister   . Diabetes Brother   . Stroke Brother   . Diabetes Brother   . Breast cancer Maternal Aunt   . Breast cancer Paternal Aunt   . Lung cancer Cousin   . Lung disease Neg Hx   . Rheumatologic disease Neg Hx     Social History   Socioeconomic History  . Marital status: Married    Spouse name: Not on file  . Number of children: Not on file  . Years of education: Not on file  . Highest education level: Not on file  Occupational History  . Not on file  Social Needs  . Financial resource strain: Not hard at all  . Food insecurity:    Worry: Never true    Inability: Never true  . Transportation needs:    Medical: No    Non-medical: No  Tobacco Use  . Smoking status: Never Smoker  . Smokeless tobacco: Never Used  . Tobacco comment: father growing up  Substance and Sexual Activity  . Alcohol use: Yes    Comment: Occasional  . Drug use: No  . Sexual activity: Yes    Birth control/protection: Post-menopausal  Lifestyle  . Physical activity:    Days per week: 4 days    Minutes per session: 90 min  . Stress: Not at all  Relationships  . Social connections:     Talks on phone: Not on file    Gets together: Not on file    Attends religious service: Not on file    Active member of club or organization: Not on file    Attends meetings of clubs or organizations: Not on file    Relationship status: Married  . Intimate partner violence:    Fear of current or ex partner: No    Emotionally abused: No    Physically abused: No  Forced sexual activity: No  Other Topics Concern  . Not on file  Social History Narrative   [facility] (11/14/16):   She is originally from [place]. Previously has worked as a Network engineer, in [employer], & in [employer]. Her husband was in [employer] and they traveled extensively. She lived in [place], [place], [place], [place], [place], [place]. She has 2 cats currently. Remote parakeet exposure as a child. No mold exposure. No recent hot tub exposure.     Review of Systems: See HPI, otherwise negative ROS  Physical Exam: BP (!) 94/51   Pulse 65   Temp (!) 97 F (36.1 C) (Tympanic)   Resp 10   Ht 4' 11.5" (1.511 m)   Wt 60.8 kg   SpO2 96%   BMI 26.61 kg/m  General:   Alert,  pleasant and cooperative in NAD Head:  Normocephalic and atraumatic. Neck:  Supple; no masses or thyromegaly. Lungs:  Clear throughout to auscultation.    Heart:  Regular rate and rhythm. Abdomen:  Soft, nontender and nondistended. Normal bowel sounds, without guarding, and without rebound.   Neurologic:  Alert and  oriented x4;  grossly normal neurologically.  Impression/Plan: Emily Ryan is here for an colonoscopy to be performed for dilation of sigmoid stricture and colon polyps  Risks, benefits, limitations, and alternatives regarding  colonoscopy have been reviewed with the patient.  Questions have been answered.  All parties agreeable.   Sherri Sear, MD  11/02/2017, 9:28 AM

## 2017-11-02 NOTE — Op Note (Signed)
[facility] Patient Name: Emily Ryan Procedure Date: 11/02/2017 7:11 AM MRN: [id number] Account #: [id number] Date of Birth: Nov 07, 1945 Admit Type: Outpatient Age: 72 Room: [facility] ENDO ROOM 3 Gender: Female Note Status: Finalized Procedure:            Colonoscopy Indications:          High risk colon cancer surveillance: Personal history                        of colonic polyps, Last colonoscopy: July 2019 Providers:            Lin Landsman MD, MD Referring MD:         Deborra Medina, MD (Referring MD) Medicines:            Monitored Anesthesia Care Complications:        No immediate complications. Estimated blood loss: None. Procedure:            Pre-Anesthesia Assessment:                       - Prior to the procedure, a History and Physical was                        performed, and patient medications and allergies were                        reviewed. The patient is competent. The risks and                        benefits of the procedure and the sedation options and                        risks were discussed with the patient. All questions                        were answered and informed consent was obtained.                        Patient identification and proposed procedure were                        verified by the physician, the nurse, the                        anesthesiologist, the anesthetist and the technician in                        the pre-procedure area in the procedure room in the                        endoscopy suite. Mental Status Examination: alert and                        oriented. Airway Examination: normal oropharyngeal                        airway and neck mobility. Respiratory Examination:                        clear to auscultation. CV Examination: normal.  Prophylactic Antibiotics: The patient does not require                        prophylactic antibiotics. Prior Anticoagulants:  The                        patient has taken no previous anticoagulant or                        antiplatelet agents. ASA Grade Assessment: III - A                        patient with severe systemic disease. After reviewing                        the risks and benefits, the patient was deemed in                        satisfactory condition to undergo the procedure. The                        anesthesia plan was to use monitored anesthesia care                        (MAC). Immediately prior to administration of                        medications, the patient was re-assessed for adequacy                        to receive sedatives. The heart rate, respiratory rate,                        oxygen saturations, blood pressure, adequacy of                        pulmonary ventilation, and response to care were                        monitored throughout the procedure. The physical status                        of the patient was re-assessed after the procedure.                       After obtaining informed consent, the colonoscope was                        passed under direct vision. Throughout the procedure,                        the patient's blood pressure, pulse, and oxygen                        saturations were monitored continuously. The                        Colonoscope was introduced through the anus and  advanced to the 5 cm into the ileum. The colonoscopy                        was performed with difficulty due to bowel stenosis.                        Successful completion of the procedure was aided by                        withdrawing the scope and replacing with the adult                        endoscope and performing the maneuvers documented                        (below) in this report. The patient tolerated the                        procedure well. The quality of the bowel preparation                        was evaluated using the BBPS [facility] Bowel  Preparation                        Scale) with scores of: Right Colon = 3, Transverse                        Colon = 3 and Left Colon = 3 (entire mucosa seen well                        with no residual staining, small fragments of stool or                        opaque liquid). The total BBPS score equals 9. Findings:      Adult endoscope was used for the entire procedure      The perianal and digital rectal examinations were normal. Pertinent       negatives include normal sphincter tone and no palpable rectal lesions.      Skin tags were found on perianal exam.      Two sessile polyps were found in the transverse colon. The polyps were 5       to 8 mm in size. These polyps were removed with a hot snare. Resection       and retrieval were complete.      A few diverticula were found in the ascending colon.      The retroflexed view of the distal rectum and anal verge was normal and       showed no anal or rectal abnormalities.      The terminal ileum appeared normal.      There was evidence of a prior end-to-side colo-colonic anastomosis in       the sigmoid colon and at 20 cm proximal to the anus. This was non-patent       and was characterized by severe stenosis. The anastomosis was traversed       after dilation. A TTS dilator was passed through the scope. Dilation       with a 10-14-10 mm anastomotic balloon dilator was performed. The  dilation site was examined following endoscope reinsertion and showed       moderate improvement in luminal narrowing. Estimated blood loss was       minimal. Impression:           - Perianal skin tags found on perianal exam.                       - Anastomotic Stricture in the sigmoid colon at 20 cm                        proximal to the anus. Dilated to 44m.                       - Two 5 to 8 mm polyps in the transverse colon, removed                        with a hot snare. Resected and retrieved.                       - Diverticulosis in  the ascending colon.                       - The distal rectum and anal verge are normal on                        retroflexion view. Recommendation:       - Discharge patient to home (with spouse).                       - Resume regular diet today.                       - Continue present medications.                       - Await pathology results.                       - Repeat colonoscopy in 3 years for surveillance of                        multiple polyps.                       - Recommend to ude adult endoscope for future                        colonoscopies due to sigmoid stricture Procedure Code(s):    --- Professional ---                       [id number] Colonoscopy, flexible; with removal of tumor(s),                        polyp(s), or other lesion(s) by snare technique                       45386, Colonoscopy, flexible; with transendoscopic                        balloon dilation  45386, Colonoscopy, flexible; with transendoscopic                        balloon dilation Diagnosis Code(s):    --- Professional ---                       Z86.010, Personal history of colonic polyps                       K56.699, Other intestinal obstruction unspecified as to                        partial versus complete obstruction                       D12.3, Benign neoplasm of transverse colon (hepatic                        flexure or splenic flexure)                       K64.4, Residual hemorrhoidal skin tags                       K57.30, Diverticulosis of large intestine without                        perforation or abscess without bleeding CPT copyright 2018 American Medical Association. All rights reserved. The codes documented in this report are preliminary and upon coder review may  be revised to meet current compliance requirements. Dr. Ulyess Mort Lin Landsman MD, MD 11/02/2017 9:12:38 AM This report has been signed electronically. Number of Addenda:  0 Note Initiated On: 11/02/2017 7:11 AM Scope Withdrawal Time: 0 hours 13 minutes 20 seconds  Total Procedure Duration: 0 hours 22 minutes 44 seconds       [facility]

## 2017-11-02 NOTE — Anesthesia Preprocedure Evaluation (Signed)
Anesthesia Evaluation  Patient identified by MRN, date of birth, ID band Patient awake    Reviewed: Allergy & Precautions, NPO status , Patient's Chart, lab work & pertinent test results, reviewed documented beta blocker date and time   Airway Mallampati: II  TM Distance: >3 FB     Dental  (+) Chipped   Pulmonary           Cardiovascular hypertension, Pt. on medications      Neuro/Psych    GI/Hepatic   Endo/Other  diabetes, Type 2  Renal/GU      Musculoskeletal   Abdominal   Peds  Hematology   Anesthesia Other Findings EF 60-65.  Reproductive/Obstetrics                             Anesthesia Physical Anesthesia Plan  ASA: III  Anesthesia Plan: General   Post-op Pain Management:    Induction: Intravenous  PONV Risk Score and Plan:   Airway Management Planned:   Additional Equipment:   Intra-op Plan:   Post-operative Plan:   Informed Consent: I have reviewed the patients History and Physical, chart, labs and discussed the procedure including the risks, benefits and alternatives for the proposed anesthesia with the patient or authorized representative who has indicated his/her understanding and acceptance.     Plan Discussed with: CRNA  Anesthesia Plan Comments:         Anesthesia Quick Evaluation

## 2017-11-02 NOTE — Anesthesia Post-op Follow-up Note (Signed)
Anesthesia QCD[employer] form completed.        

## 2017-11-02 NOTE — Anesthesia Postprocedure Evaluation (Signed)
Anesthesia Post Note  Patient: Emily Ryan  Procedure(s) Performed: COLONOSCOPY WITH PROPOFOL (N/A )  Patient location during evaluation: Endoscopy Anesthesia Type: General Level of consciousness: awake and alert Pain management: pain level controlled Vital Signs Assessment: post-procedure vital signs reviewed and stable Respiratory status: spontaneous breathing, nonlabored ventilation, respiratory function stable and patient connected to nasal cannula oxygen Cardiovascular status: blood pressure returned to baseline and stable Postop Assessment: no apparent nausea or vomiting Anesthetic complications: no     Last Vitals:  Vitals:   11/02/17 0929 11/02/17 0939  BP: 115/69 133/76  Pulse: 63 61  Resp: 11 14  Temp:    SpO2: 96% 97%    Last Pain:  Vitals:   11/02/17 0939  TempSrc:   PainSc: 0-No pain                 Axcel Horsch S

## 2017-11-03 ENCOUNTER — Encounter: Payer: Self-pay | Admitting: Gastroenterology

## 2017-11-06 DIAGNOSIS — Z5181 Encounter for therapeutic drug level monitoring: Secondary | ICD-10-CM | POA: Diagnosis not present

## 2017-11-06 DIAGNOSIS — L400 Psoriasis vulgaris: Secondary | ICD-10-CM | POA: Diagnosis not present

## 2017-11-07 ENCOUNTER — Telehealth: Payer: Self-pay | Admitting: Gastroenterology

## 2017-11-07 ENCOUNTER — Telehealth: Payer: Self-pay | Admitting: Internal Medicine

## 2017-11-07 NOTE — Telephone Encounter (Signed)
Patient called & left a message on the answering machine stating she had a colonoscopy by [name] on 11-02-17. She was told that she would need surgical repair from prior colon surgery & she would like to know what the next step is. Should she expect the surgeons office to call her or do we make the appointment. Please adviser her.

## 2017-11-07 NOTE — Telephone Encounter (Signed)
Pt dropped off a pro-authorization form for her Dulaglutide (TRULICITY) 1.23 NP/5.9AW SOPN to be completed. Form is up front in color folder.

## 2017-11-07 NOTE — Telephone Encounter (Signed)
Form has been placed in red folder.  

## 2017-11-08 NOTE — Telephone Encounter (Signed)
Referral has been sent to [place] for colorectal surgery, pt has been notified

## 2017-11-10 ENCOUNTER — Encounter: Payer: Self-pay | Admitting: Dermatology

## 2017-11-13 ENCOUNTER — Encounter: Payer: Self-pay | Admitting: Gastroenterology

## 2017-11-14 ENCOUNTER — Ambulatory Visit: Payer: Self-pay

## 2017-11-14 NOTE — Telephone Encounter (Signed)
Pt called from [place]. Last Friday, pt was walking around [facility] and she stepped into a hole. The right ankle has swelling and there is bruising to the lateral side to the foot and ankle. Pt stated her son wrapped it and she has been walking on it. Pt stated that pain is mild and is taking Ibuprofen. No breaks in the skin. Pt is flying back to [place] tonight. Advised pt to continue to ice ankle and if she wears compression stockings, she can put ACE wrap over the stockings. Pt advised to take off the ACE wrap if her toes start tingling or become numb. Pt advised to seek medical attention if she develops any calf pain or swelling or if it is warm to the site of the pain. Pt advised to elevate foot when she gets home until her appointment tomorrow. Care advice given to pt and pt verbalized understanding. Appointment made for tomorrow at 10:40 with Philis Nettle FNP.  Reason for Disposition . [1] MODERATE pain (e.g., interferes with normal activities, limping) AND [2] present > 3 days . [1] Limp when walking AND [2] due to a twisted ankle or foot  Answer Assessment - Initial Assessment Questions 1. ONSET: "When did the pain start?"      Last Friday 2. LOCATION: "Where is the pain located?"       Right ankle/foot  3. PAIN: "How bad is the pain?"    (Scale 1-10; or mild, moderate, severe)  - MILD (1-3): doesn't interfere with normal activities   - MODERATE (4-7): interferes with normal activities (e.g., work or school) or awakens from sleep, limping   - SEVERE (8-10): excruciating pain, unable to do any normal activities, unable to walk      moderate 4. WORK OR EXERCISE: "Has there been any recent work or exercise that involved this part of the body?"      Stepped in a hole  5. CAUSE: "What do you think is causing the ankle pain?"     Either a sprained or broken ankle 6. OTHER SYMPTOMS: "Do you have any other symptoms?" (e.g., calf pain, rash, fever, swelling)     Swelling abd brusing 7.  PREGNANCY: "Is there any chance you are pregnant?" "When was your last menstrual period?"     n/a  Answer Assessment - Initial Assessment Questions 1. MECHANISM: "How did the injury happen?" (e.g., twisting injury, direct blow)      Stepped in a hole  2. ONSET: "When did the injury happen?" (Minutes or hours ago)      Friday last week 3. LOCATION: "Where is the injury located?"      Right ankle 4. APPEARANCE of INJURY: "What does the injury look like?"      Ankle is swollen and there is bruising to the outer part of the ankle  5. WEIGHT-BEARING: "Can you put weight on that foot?" "Can you walk (four steps or more)?"       Yes 6. SIZE: For cuts, bruises, or swelling, ask: "How large is it?" (e.g., inches or centimeters;  entire joint)       7. PAIN: "Is there pain?" If so, ask: "How bad is the pain?"    (e.g., Scale 1-10; or mild, moderate, severe)     mild 8. TETANUS: For any breaks in the skin, ask: "When was the last tetanus booster?"     n/a 9. OTHER SYMPTOMS: "Do you have any other symptoms?"      no 10. PREGNANCY: "Is  there any chance you are pregnant?" "When was your last menstrual period?"       n/a  Protocols used: ANKLE PAIN-A-AH, FOOT AND ANKLE INJURY-A-AH

## 2017-11-15 ENCOUNTER — Encounter: Payer: Self-pay | Admitting: Family Medicine

## 2017-11-15 ENCOUNTER — Ambulatory Visit (INDEPENDENT_AMBULATORY_CARE_PROVIDER_SITE_OTHER): Payer: Medicare Other | Admitting: Family Medicine

## 2017-11-15 ENCOUNTER — Ambulatory Visit (INDEPENDENT_AMBULATORY_CARE_PROVIDER_SITE_OTHER): Payer: Medicare Other

## 2017-11-15 VITALS — BP 130/78 | HR 79 | Temp 97.6°F | Ht 58.5 in | Wt 137.4 lb

## 2017-11-15 DIAGNOSIS — S8264XA Nondisplaced fracture of lateral malleolus of right fibula, initial encounter for closed fracture: Secondary | ICD-10-CM | POA: Diagnosis not present

## 2017-11-15 DIAGNOSIS — S99911A Unspecified injury of right ankle, initial encounter: Secondary | ICD-10-CM | POA: Diagnosis not present

## 2017-11-15 DIAGNOSIS — S82831A Other fracture of upper and lower end of right fibula, initial encounter for closed fracture: Secondary | ICD-10-CM | POA: Diagnosis not present

## 2017-11-15 DIAGNOSIS — S8261XA Displaced fracture of lateral malleolus of right fibula, initial encounter for closed fracture: Secondary | ICD-10-CM | POA: Diagnosis not present

## 2017-11-15 DIAGNOSIS — S82891A Other fracture of right lower leg, initial encounter for closed fracture: Secondary | ICD-10-CM

## 2017-11-15 DIAGNOSIS — S61012A Laceration without foreign body of left thumb without damage to nail, initial encounter: Secondary | ICD-10-CM

## 2017-11-15 NOTE — Patient Instructions (Addendum)
[facility] -- open 1 - 7pm for walk ins

## 2017-11-15 NOTE — Progress Notes (Addendum)
Subjective:    Patient ID: Emily Ryan, female    DOB: [birth year], 72 y.o.   MRN: [id number]  HPI  Patient presents to clinic c/o right ankle injury that occurred on 11/10/17. She was walking around in [facility], stepped in hole and rolled right ankle.  Her son wrap the ankle, and she is been using ibuprofen to help improve pain.  Patient flew home from [place] over the weekend, and has been walking around on ankle with pain.  Due to a continuing to hurt, she thought it was best to come in and have it checked.  Patient also accidentally cut finger this morning when reaching into bag to empty out clothing from the trip, sliced her left thumb on edge of razor.  Believes she just remove the top layer of skin, but area was bleeding when she put a Band-Aid on so she cannot be sure.  Patient Active Problem List   Diagnosis Date Noted  . Hx of colonic polyps   . Anastomotic stricture of colorectal region   . S/P partial colectomy 08/05/2017  . Tubular adenoma of colon 08/03/2017  . Rectal stricture   . Overweight (BMI 25.0-29.9) 05/04/2016  . Basal cell carcinoma of right forehead 08/28/2015  . Patent foramen ovale with right to left shunt 08/28/2015  . Postmenopausal estrogen deficiency 05/07/2015  . Diabetes mellitus without complication ([place]) 32/99/2426  . Allergic rhinitis 01/28/2013  . Basal cell carcinoma of left side of nose 04/28/2012  . Long-term use of high-risk medication 10/21/2011  . Routine general medical examination at a health care facility 07/18/2011  . Guttate psoriasis 04/04/2011  . Constipation 01/19/2011  . Reflux esophagitis 01/19/2011  . Diverticulosis of sigmoid colon 11/10/2010  . Gastritis 09/09/2010  . Essential hypertension 09/07/2010  . Hyperlipidemia 09/07/2010   Social History   Tobacco Use  . Smoking status: Never Smoker  . Smokeless tobacco: Never Used  . Tobacco comment: father growing up  Substance Use Topics  . Alcohol use: Yes   Comment: Occasional   Review of Systems  Constitutional: Negative for chills, fatigue and fever.  HENT: Negative for congestion, ear pain, sinus pain and sore throat.   Eyes: Negative.   Respiratory: Negative for cough, shortness of breath and wheezing.   Cardiovascular: Negative for chest pain, palpitations and leg swelling.  Gastrointestinal: Negative for abdominal pain, diarrhea, nausea and vomiting.  Genitourinary: Negative for dysuria, frequency and urgency.  Musculoskeletal: right ankle injury.  Skin: cut on left thumb from razor Neurological: Negative for syncope, light-headedness and headaches.  Psychiatric/Behavioral: The patient is not nervous/anxious.       Objective:   Physical Exam  Constitutional: She is oriented to person, place, and time. She appears well-nourished. No distress.  HENT:  Head: Normocephalic and atraumatic.  Eyes: Conjunctivae and EOM are normal. No scleral icterus.  Neck: Neck supple. No tracheal deviation present.  Cardiovascular: Normal rate.  Pulmonary/Chest: Effort normal. No respiratory distress.  Musculoskeletal:       Right foot: There is tenderness.       Feet:  Tenderness some mild swelling of the right ankle, tenderness more present on her ankle.  Patient is able to wiggle all toes ankle and also point and flex foot.  Range of motion of ankle does cause some pain.  No obvious bruising seen.  Pedal pulses normal.  Neurological: She is alert and oriented to person, place, and time.  Skin: Skin is warm and dry.  Small lac on fat  pad of left thumb, surface layer of skin removed. Area cleansed with sterile normal saline, dried, thin layer of ointment applied and covered with Band-Aid.  Psychiatric: She has a normal mood and affect. Her behavior is normal.  Nursing note and vitals reviewed.     Vitals:   11/15/17 1047  BP: 130/78  Pulse: 79  Temp: 97.6 F (36.4 C)  SpO2: 96%   Assessment & Plan:   Injury of right ankle/closed  fracture right ankle- x-ray done in clinic, does show small fracture.  Patient given Aircast, applied in clinic by me and also patient giving crutches.  Patient shown how to use crutches by me, demonstrated understanding of crutch use.  [facility] will go to [facility] walk-in, patient aware they take walk-ins from 1-7pm of orthopedic issues.  CD of her x-ray made and given to patient to bring with her to clinic.  Laceration of skin of left arm- area cleansed, and bandage applied.  Patient advised to change Band-Aid once per day for the next 5 to 7 days and area should slowly heal.  Keep regularly scheduled follow-up as planned with PCP.  Return to clinic sooner if issues arise.

## 2017-11-23 ENCOUNTER — Telehealth: Payer: Self-pay | Admitting: Internal Medicine

## 2017-11-23 LAB — SURGICAL PATHOLOGY

## 2017-11-23 NOTE — Telephone Encounter (Signed)
Copied from [place] [id number]. Topic: Quick Communication - Rx Refill/Question >> Nov 23, 2017  9:30 AM Margot Ables wrote: Medication: ezetimibe (ZETIA) 10 MG tablet - pt called asking for hard copy RX for this medication in a 90 day supply with 3 refills (good for 1 year). She takes the RX to a place in [place] and has to have a hard copy. Please advise pt when RX ready for pickup or if concerns/questions.

## 2017-11-24 ENCOUNTER — Other Ambulatory Visit: Payer: Self-pay

## 2017-11-24 ENCOUNTER — Telehealth: Payer: Self-pay

## 2017-11-24 DIAGNOSIS — K2100 Gastro-esophageal reflux disease with esophagitis, without bleeding: Secondary | ICD-10-CM

## 2017-11-24 MED ORDER — EZETIMIBE 10 MG PO TABS: 10.0000 mg | ORAL_TABLET | Freq: Every day | ORAL | 3 refills | Status: DC

## 2017-11-24 NOTE — Telephone Encounter (Signed)
rx has been printed and placed in quick sign folder.  

## 2017-11-24 NOTE — Addendum Note (Signed)
Addended by: Adair Laundry on: 11/24/2017 03:13 PM   Modules accepted: Orders

## 2017-11-24 NOTE — Telephone Encounter (Signed)
She came by earlier and I signed it from [place].  Check with her to make sure

## 2017-11-24 NOTE — Telephone Encounter (Signed)
Printed and placed in quick sign folder 

## 2017-11-27 ENCOUNTER — Encounter: Payer: Self-pay | Admitting: Nurse Practitioner

## 2017-11-27 ENCOUNTER — Ambulatory Visit (INDEPENDENT_AMBULATORY_CARE_PROVIDER_SITE_OTHER): Payer: Medicare Other | Admitting: Nurse Practitioner

## 2017-11-27 VITALS — BP 128/70 | HR 85 | Ht 58.5 in | Wt 135.0 lb

## 2017-11-27 DIAGNOSIS — J209 Acute bronchitis, unspecified: Secondary | ICD-10-CM | POA: Diagnosis not present

## 2017-11-27 MED ORDER — AZITHROMYCIN 250 MG PO TABS: ORAL_TABLET | ORAL | 0 refills | Status: DC

## 2017-11-27 MED ORDER — METHYLPREDNISOLONE ACETATE 80 MG/ML IJ SUSP
80.0000 mg | Freq: Once | INTRAMUSCULAR | Status: AC
  Administered 2017-11-27: 80 mg via INTRAMUSCULAR

## 2017-11-27 NOTE — Patient Instructions (Signed)
Will order azithromycin  DepoMedrol in office today Samples of mucinex and delsym given in office today Continue current medications Follow up with Dr. [place] Bells as scheduled

## 2017-11-27 NOTE — Progress Notes (Signed)
@Patient  ID: Emily Ryan, female    DOB: 06/17/1945, 72 y.o.   MRN: [id number]  Chief Complaint  Patient presents with  . Cough    Referring provider: Crecencio Mc, MD  HPI  72 year old female with allergic rhinitis followed by Dr. Lake Bells  Tests: PFTS done at [facility] May 2014    FVC 2.44L  105% predFEV 1 1.96L (119% predicted) FEV1/FVC : 80 % pred DLCO 111% pred TLC 4.12L  102%  RV 1.66L  112%  07/2012 spiro PFT> ratio 82%, FEV 1 1.98L (105% pred), no change with BD 07/2012 methacholine challenge> no change with ay dose. Negative study   OV 11/27/17 - cough Patient presents for a cough that started around 1 week ago. She states that cough has progressively worsened. Cough is nonproductive. States that she usually gets bronchitis once per year. She denies any sinus congestion or pressure. She denies any fever, chest pain, or edema. She is compliant with medications.    No Known Allergies  Immunization History  Administered Date(s) Administered  . Influenza Split 10/19/2011  . Influenza, High Dose Seasonal PF 10/01/2014, 10/06/2016, 09/06/2017  . Influenza,inj,Quad PF,6+ Mos 10/31/2012, 09/26/2013, 08/26/2015  . Pneumococcal Conjugate-13 01/28/2013  . Pneumococcal Polysaccharide-23 11/02/2010, 05/04/2016  . Tdap 11/16/2010  . Zoster 10/19/2007  . Zoster Recombinat (Shingrix) 06/25/2017, 09/06/2017    Past Medical History:  Diagnosis Date  . Complicated pregnancy    1st pregnancy complicated by post operative hemorrhage and 2ng complicated by epidural  . Diabetes mellitus   . Diverticulosis of colon (without mention of hemorrhage)   . Guttate psoriasis   . Hyperlipidemia   . Hypertension   . Menopausal disorder     Tobacco History: Social History   Tobacco Use  Smoking Status Never Smoker  Smokeless Tobacco Never Used  Tobacco Comment   father growing up   Counseling given: Yes Comment: father growing up   Outpatient Encounter Medications as of  11/27/2017  Medication Sig  . Ascorbic Acid (VITAMIN C) 1000 MG tablet Take 1,000 mg by mouth.  Marland Kitchen aspirin 81 MG chewable tablet Chew 81 mg by mouth daily.   . Calcium Carb-Cholecalciferol 600-100 MG-UNIT CAPS Take 1 tablet by mouth daily. Two by mouth daily (Patient taking differently: Take 1 tablet by mouth daily. )  . calcium carbonate (CALCIUM 600) 600 MG TABS tablet Take 600 mg by mouth.  . Dulaglutide (TRULICITY) 1.82 XH/3.7JI SOPN Inject 0.75 mg into the skin once a week.  . escitalopram (LEXAPRO) 5 MG tablet Take 1 tablet (5 mg total) by mouth daily.  Marland Kitchen ezetimibe (ZETIA) 10 MG tablet Take 1 tablet (10 mg total) by mouth daily.  . folic acid (FOLVITE) 1 MG tablet Take 1 mg by mouth daily.  Marland Kitchen glipiZIDE (GLUCOTROL) 5 MG tablet Take 1 tablet (5 mg total) by mouth 2 (two) times daily before a meal.  . glucose blood (FREESTYLE LITE) test strip Use as instructed to test blood sugar twice daily  . hydrochlorothiazide (HYDRODIURIL) 12.5 MG tablet Take 1 tablet (12.5 mg total) by mouth daily.  Marland Kitchen lisinopril (PRINIVIL,ZESTRIL) 40 MG tablet Take 1 tablet (40 mg total) by mouth daily.  Marland Kitchen loratadine (CLARITIN) 10 MG tablet Take 1 tablet (10 mg total) by mouth daily.  . metFORMIN (GLUCOPHAGE XR) 750 MG 24 hr tablet Take 1 tablet (750 mg total) by mouth daily with breakfast.  . methotrexate (RHEUMATREX) 2.5 MG tablet Take 7.5 mg by mouth every Saturday.   . montelukast (  SINGULAIR) 10 MG tablet Take 1 tablet (10 mg total) by mouth at bedtime.  . Multiple Vitamin (MULTIVITAMIN) capsule Take 1 capsule by mouth daily.    Marland Kitchen omeprazole (PRILOSEC) 20 MG capsule Take 1 capsule (20 mg total) by mouth daily.  . Precision Thin Lancets MISC Use as directed to check sugars twice daily  . simvastatin (ZOCOR) 40 MG tablet Take 1 tablet (40 mg total) daily by mouth.  . vitamin E 400 UNIT capsule Take 400 Units by mouth daily.    Marland Kitchen azithromycin (ZITHROMAX) 250 MG tablet Take 2 tablets (500 mg) on day 1, then take 1  tablet (250 mg) on days 2-5  . [EXPIRED] methylPREDNISolone acetate (DEPO-MEDROL) injection 80 mg    No facility-administered encounter medications on file as of 11/27/2017.      Review of Systems  Review of Systems  Constitutional: Negative.  Negative for chills and fever.  HENT: Negative.  Negative for congestion, postnasal drip, sinus pressure and sinus pain.   Respiratory: Positive for cough. Negative for shortness of breath.   Cardiovascular: Negative.  Negative for chest pain, palpitations and leg swelling.  Gastrointestinal: Negative.   Allergic/Immunologic: Negative.   Neurological: Negative.   Psychiatric/Behavioral: Negative.        Physical Exam  BP 128/70 (BP Location: Left Arm, Patient Position: Sitting, Cuff Size: Normal)   Pulse 85   Ht 4' 10.5" (1.486 m)   Wt 135 lb (61.2 kg)   SpO2 97%   BMI 27.73 kg/m   Wt Readings from Last 5 Encounters:  11/27/17 135 lb (61.2 kg)  11/15/17 137 lb 6.4 oz (62.3 kg)  11/02/17 134 lb (60.8 kg)  09/06/17 135 lb 6.4 oz (61.4 kg)  08/03/17 134 lb 6.4 oz (61 kg)     Physical Exam  Constitutional: She is oriented to person, place, and time. She appears well-developed and well-nourished. No distress.  Cardiovascular: Normal rate and regular rhythm.  Pulmonary/Chest: Effort normal and breath sounds normal. No respiratory distress. She has no wheezes. She exhibits no tenderness.  Harsh cough noted  Musculoskeletal: She exhibits no edema.  Neurological: She is alert and oriented to person, place, and time.  Psychiatric: She has a normal mood and affect.  Nursing note and vitals reviewed.    Imaging: Dg Ankle Complete Right  Result Date: 11/15/2017 CLINICAL DATA:  Initial evaluation for acute ankle pain status post recent injury. EXAM: RIGHT ANKLE - COMPLETE 3+ VIEW COMPARISON:  None. FINDINGS: There is a transverse lucency extending through the distal aspect of the right lateral malleolus, somewhat age indeterminate,  but likely acute given history and symptomatology. Prominent overlying soft tissue swelling. Ankle mortise approximated. Talar dome intact. Plantar calcaneal enthesophyte noted. IMPRESSION: 1. Acute nondisplaced transverse fracture through the distal right fibula/lateral malleolus. 2. Prominent overlying soft tissue swelling. Electronically Signed   By: Jeannine Boga M.D.   On: 11/15/2017 15:48     Assessment & Plan:   Acute bronchitis Patient Instructions  Will order azithromycin  DepoMedrol in office today Samples of mucinex and delsym given in office today Continue current medications Follow up with Dr. Lake Bells as scheduled         Fenton Foy, NP 11/27/2017

## 2017-11-27 NOTE — Progress Notes (Signed)
Reviewed, agree 

## 2017-11-27 NOTE — Assessment & Plan Note (Signed)
Patient Instructions  Will order azithromycin  DepoMedrol in office today Samples of mucinex and delsym given in office today Continue current medications Follow up with Dr. Lake Bells as scheduled

## 2017-11-28 DIAGNOSIS — S8264XA Nondisplaced fracture of lateral malleolus of right fibula, initial encounter for closed fracture: Secondary | ICD-10-CM | POA: Diagnosis not present

## 2017-11-29 ENCOUNTER — Other Ambulatory Visit (INDEPENDENT_AMBULATORY_CARE_PROVIDER_SITE_OTHER): Payer: Medicare Other

## 2017-11-29 DIAGNOSIS — E7800 Pure hypercholesterolemia, unspecified: Secondary | ICD-10-CM

## 2017-11-29 DIAGNOSIS — E559 Vitamin D deficiency, unspecified: Secondary | ICD-10-CM | POA: Diagnosis not present

## 2017-11-29 DIAGNOSIS — E119 Type 2 diabetes mellitus without complications: Secondary | ICD-10-CM | POA: Diagnosis not present

## 2017-11-29 LAB — COMPREHENSIVE METABOLIC PANEL
ALT: 27 U/L (ref 0–35)
AST: 20 U/L (ref 0–37)
Albumin: 4.3 g/dL (ref 3.5–5.2)
Alkaline Phosphatase: 72 U/L (ref 39–117)
BUN: 16 mg/dL (ref 6–23)
CHLORIDE: 99 meq/L (ref 96–112)
CO2: 31 meq/L (ref 19–32)
CREATININE: 0.64 mg/dL (ref 0.40–1.20)
Calcium: 9.2 mg/dL (ref 8.4–10.5)
GFR: 96.66 mL/min (ref >60.00)
Glucose, Bld: 156 mg/dL — ABNORMAL HIGH (ref 70–99)
Potassium: 4.4 mEq/L (ref 3.5–5.1)
SODIUM: 137 meq/L (ref 135–145)
Total Bilirubin: 0.4 mg/dL (ref 0.2–1.2)
Total Protein: 7.5 g/dL (ref 6.0–8.3)

## 2017-11-29 LAB — LIPID PANEL
CHOL/HDL RATIO: 3
Cholesterol: 149 mg/dL (ref 0–200)
HDL: 53.4 mg/dL (ref >39.00)
LDL CALC: 76 mg/dL (ref 0–99)
NONHDL: 95.95
TRIGLYCERIDES: 102 mg/dL (ref 0.0–149.0)
VLDL: 20.4 mg/dL (ref 0.0–40.0)

## 2017-11-29 LAB — MICROALBUMIN / CREATININE URINE RATIO
CREATININE, U: 110.9 mg/dL
MICROALB/CREAT RATIO: 1 mg/g (ref 0.0–30.0)
Microalb, Ur: 1.1 mg/dL (ref 0.0–1.9)

## 2017-11-29 LAB — VITAMIN D 25 HYDROXY (VIT D DEFICIENCY, FRACTURES): VITD: 34.28 ng/mL (ref 30.00–100.00)

## 2017-11-29 LAB — HEMOGLOBIN A1C: HEMOGLOBIN A1C: 7.4 % — AB (ref 4.6–6.5)

## 2017-12-06 ENCOUNTER — Ambulatory Visit (INDEPENDENT_AMBULATORY_CARE_PROVIDER_SITE_OTHER): Payer: Medicare Other | Admitting: Internal Medicine

## 2017-12-06 ENCOUNTER — Telehealth: Payer: Self-pay | Admitting: Internal Medicine

## 2017-12-06 ENCOUNTER — Encounter: Payer: Self-pay | Admitting: Internal Medicine

## 2017-12-06 DIAGNOSIS — E7800 Pure hypercholesterolemia, unspecified: Secondary | ICD-10-CM

## 2017-12-06 DIAGNOSIS — S82891D Other fracture of right lower leg, subsequent encounter for closed fracture with routine healing: Secondary | ICD-10-CM

## 2017-12-06 DIAGNOSIS — E119 Type 2 diabetes mellitus without complications: Secondary | ICD-10-CM | POA: Diagnosis not present

## 2017-12-06 DIAGNOSIS — J209 Acute bronchitis, unspecified: Secondary | ICD-10-CM | POA: Diagnosis not present

## 2017-12-06 DIAGNOSIS — I10 Essential (primary) hypertension: Secondary | ICD-10-CM | POA: Diagnosis not present

## 2017-12-06 MED ORDER — LEVOFLOXACIN 500 MG PO TABS: 500.0000 mg | ORAL_TABLET | Freq: Every day | ORAL | 0 refills | Status: DC

## 2017-12-06 MED ORDER — OSELTAMIVIR PHOSPHATE 75 MG PO CAPS: 75.0000 mg | ORAL_CAPSULE | Freq: Every day | ORAL | 0 refills | Status: DC

## 2017-12-06 MED ORDER — HYDROCHLOROTHIAZIDE 12.5 MG PO TABS: 12.5000 mg | ORAL_TABLET | Freq: Every day | ORAL | 3 refills | Status: DC

## 2017-12-06 MED ORDER — PROMETHAZINE HCL 25 MG PO TABS: 25.0000 mg | ORAL_TABLET | Freq: Three times a day (TID) | ORAL | 0 refills | Status: DC | PRN

## 2017-12-06 MED ORDER — GLIPIZIDE 5 MG PO TABS: 5.0000 mg | ORAL_TABLET | Freq: Two times a day (BID) | ORAL | 3 refills | Status: DC

## 2017-12-06 MED ORDER — SIMVASTATIN 40 MG PO TABS: 40.0000 mg | ORAL_TABLET | Freq: Every day | ORAL | 3 refills | Status: DC

## 2017-12-06 NOTE — Telephone Encounter (Signed)
Orders needed fpr labs pt would like to have labs done prior to 04/11/18 appt. Please and Thank you!

## 2017-12-06 NOTE — Patient Instructions (Addendum)
Use the tessalon round the clock to suppress cough.  Take the tamiflu ,  levaquin and promethazine with you on your cruise  tamiflu for flu like symptoms  levaquin for sinus,  Ear or bladder infections  Promethazine for nausea and vomiting  Take immodium OTC for diarrhea   Check sugars 2 hours after breakfast for one week,   Then after lunch for one week,  Then after dinner for one wee (2 hrs)  And send me the readings

## 2017-12-06 NOTE — Progress Notes (Signed)
Subjective:  Patient ID: Emily Ryan, female    DOB: 19-Jul-1945  Age: 72 y.o. MRN: [id number]  CC: Diagnoses of Acute bronchitis, unspecified organism, Diabetes mellitus without complication ([place]), Pure hypercholesterolemia, Essential hypertension, and Fracture of malleolus of right ankle, closed, with routine healing, subsequent encounter were pertinent to this visit.  HPI Emily Ryan presents for 4 month follow up on diabetes.  Patient has no complaints today.  Patient is following a low glycemic index diet and taking metfromin XR,  Trulicity  And glipizide in the evening  without side effects.  Fasting sugars have been  less than 140 most of the time and post prandials have not been checked. Patient is not exercising due to suffering  Lateral malleolar fracture of the right ankle several weeks ago.  Patient has had an eye exam in the last 12 months and checks feet regularly for signs of infection.  Patient does not walk barefoot outside,  And denies an numbness tingling or burning in feet. Patient is up to date on all recommended vaccinations  In late Nov Had LATERAL MALLEOLAR FRACTURE RIGHT ANKLE WHILE vacationing  IN [place]   Saw [name] who has referred her  to [place] for [place] surgery .  Due to recent  colonoscopy.  It was a difficult procedure due to scar tissue.  Has been having recurrent  issues with constipation   Recovering from URI,  Was treated   Nov 25 for purulent bronchitis With z pack , and methylprednisolone injection.   Outpatient Medications Prior to Visit  Medication Sig Dispense Refill  . Ascorbic Acid (VITAMIN C) 1000 MG tablet Take 1,000 mg by mouth.    Marland Kitchen aspirin 81 MG chewable tablet Chew 81 mg by mouth daily.     . Calcium Carb-Cholecalciferol 600-100 MG-UNIT CAPS Take 1 tablet by mouth daily. Two by mouth daily (Patient taking differently: Take 1 tablet by mouth daily. ) 90 capsule 3  . calcium carbonate (CALCIUM 600) 600 MG TABS tablet  Take 600 mg by mouth.    . Dulaglutide (TRULICITY) 9.67 EL/3.8BO SOPN Inject 0.75 mg into the skin once a week. 13 pen 3  . escitalopram (LEXAPRO) 5 MG tablet Take 1 tablet (5 mg total) by mouth daily. 90 tablet 3  . ezetimibe (ZETIA) 10 MG tablet Take 1 tablet (10 mg total) by mouth daily. 90 tablet 3  . folic acid (FOLVITE) 1 MG tablet Take 1 mg by mouth daily.    Marland Kitchen glucose blood (FREESTYLE LITE) test strip Use as instructed to test blood sugar twice daily 200 each 3  . lisinopril (PRINIVIL,ZESTRIL) 40 MG tablet Take 1 tablet (40 mg total) by mouth daily. 90 tablet 3  . loratadine (CLARITIN) 10 MG tablet Take 1 tablet (10 mg total) by mouth daily. 90 tablet 3  . metFORMIN (GLUCOPHAGE XR) 750 MG 24 hr tablet Take 1 tablet (750 mg total) by mouth daily with breakfast. 90 tablet 3  . methotrexate (RHEUMATREX) 2.5 MG tablet Take 7.5 mg by mouth every Saturday.     . montelukast (SINGULAIR) 10 MG tablet Take 1 tablet (10 mg total) by mouth at bedtime. 90 tablet 3  . Multiple Vitamin (MULTIVITAMIN) capsule Take 1 capsule by mouth daily.      Marland Kitchen omeprazole (PRILOSEC) 20 MG capsule Take 1 capsule (20 mg total) by mouth daily. 90 capsule 3  . Precision Thin Lancets MISC Use as directed to check sugars twice daily 180 each 3  .  vitamin E 400 UNIT capsule Take 400 Units by mouth daily.      Marland Kitchen glipiZIDE (GLUCOTROL) 5 MG tablet Take 1 tablet (5 mg total) by mouth 2 (two) times daily before a meal. 180 tablet 3  . hydrochlorothiazide (HYDRODIURIL) 12.5 MG tablet Take 1 tablet (12.5 mg total) by mouth daily. 90 tablet 3  . simvastatin (ZOCOR) 40 MG tablet Take 1 tablet (40 mg total) daily by mouth. 90 tablet 3  . azithromycin (ZITHROMAX) 250 MG tablet Take 2 tablets (500 mg) on day 1, then take 1 tablet (250 mg) on days 2-5 (Patient not taking: Reported on 12/06/2017) 6 tablet 0   No facility-administered medications prior to visit.     Review of Systems;  Patient denies headache, fevers, malaise,  unintentional weight loss, skin rash, eye pain, sinus congestion and sinus pain, sore throat, dysphagia,  hemoptysis , cough, dyspnea, wheezing, chest pain, palpitations, orthopnea, edema, abdominal pain, nausea, melena, diarrhea, constipation, flank pain, dysuria, hematuria, urinary  Frequency, nocturia, numbness, tingling, seizures,  Focal weakness, Loss of consciousness,  Tremor, insomnia, depression, anxiety, and suicidal ideation.      Objective:  BP 110/68 (BP Location: Left Arm, Patient Position: Sitting, Cuff Size: Normal)   Pulse 77   Temp 98.2 F (36.8 C) (Oral)   Resp 16   Ht 4' 10.5" (1.486 m)   Wt 136 lb 9.6 oz (62 kg)   SpO2 91%   BMI 28.06 kg/m   BP Readings from Last 3 Encounters:  12/06/17 110/68  11/27/17 128/70  11/15/17 130/78    Wt Readings from Last 3 Encounters:  12/06/17 136 lb 9.6 oz (62 kg)  11/27/17 135 lb (61.2 kg)  11/15/17 137 lb 6.4 oz (62.3 kg)    General appearance: alert, cooperative and appears stated age Ears: normal TM's and external ear canals both ears Throat: lips, mucosa, and tongue normal; teeth and gums normal Neck: no adenopathy, no carotid bruit, supple, symmetrical, trachea midline and thyroid not enlarged, symmetric, no tenderness/mass/nodules Back: symmetric, no curvature. ROM normal. No CVA tenderness. Lungs: clear to auscultation bilaterally Heart: regular rate and rhythm, S1, S2 normal, no murmur, click, rub or gallop Abdomen: soft, non-tender; bowel sounds normal; no masses,  no organomegaly Pulses: 2+ and symmetric Skin: Skin color, texture, turgor normal. No rashes or lesions Lymph nodes: Cervical, supraclavicular, and axillary nodes normal.  Lab Results  Component Value Date   HGBA1C 7.4 (H) 11/29/2017   HGBA1C 6.8 (H) 08/01/2017   HGBA1C 7.0 (H) 03/29/2017    Lab Results  Component Value Date   CREATININE 0.64 11/29/2017   CREATININE 0.74 08/01/2017   CREATININE 0.65 03/29/2017    Lab Results  Component  Value Date   WBC 7.3 03/29/2017   HGB 13.9 03/29/2017   HCT 41.2 03/29/2017   PLT 263.0 03/29/2017   GLUCOSE 156 (H) 11/29/2017   CHOL 149 11/29/2017   TRIG 102.0 11/29/2017   HDL 53.40 11/29/2017   LDLDIRECT 104.0 05/04/2015   LDLCALC 76 11/29/2017   ALT 27 11/29/2017   AST 20 11/29/2017   NA 137 11/29/2017   K 4.4 11/29/2017   CL 99 11/29/2017   CREATININE 0.64 11/29/2017   BUN 16 11/29/2017   CO2 31 11/29/2017   TSH 1.62 02/04/2015   HGBA1C 7.4 (H) 11/29/2017   MICROALBUR 1.1 11/29/2017    No results found.  Assessment & Plan:   Problem List Items Addressed This Visit    Acute bronchitis    Symptoms have  nearly completely resolved after treatment with azithromycin and steroids., except for persistent intermittent non productive  cough.  Tessalon perles prescribed      Diabetes mellitus without complication ([facility])     Slight loss of  control due to recent injury resulting in suspension of exercise.  Continue meds.  Low GI  diet. No changes today .  Advised to check post prandials for he next several weeks   Lab Results  Component Value Date   HGBA1C 7.4 (H) 11/29/2017  ` Lab Results  Component Value Date   MICROALBUR 1.1 11/29/2017         Relevant Medications   glipiZIDE (GLUCOTROL) 5 MG tablet   simvastatin (ZOCOR) 40 MG tablet   Essential hypertension    Well controlled on lisinopril and hctz.   Renal function stable, no changes today.  Lab Results  Component Value Date   CREATININE 0.64 11/29/2017   Lab Results  Component Value Date   NA 137 11/29/2017   K 4.4 11/29/2017   CL 99 11/29/2017   CO2 31 11/29/2017         Relevant Medications   hydrochlorothiazide (HYDRODIURIL) 12.5 MG tablet   simvastatin (ZOCOR) 40 MG tablet   Fracture of malleolus of right ankle, closed, with routine healing, subsequent encounter    She is now full weight bearing,  Using a brace with walking.       Hyperlipidemia    LDL and triglycerides are at goal on  simvastatin/zetia . She has no side effects and liver enzymes are normal. No changes today. Continue asa for primary prevention   Lab Results  Component Value Date   CHOL 149 11/29/2017   HDL 53.40 11/29/2017   LDLCALC 76 11/29/2017   LDLDIRECT 104.0 05/04/2015   TRIG 102.0 11/29/2017   CHOLHDL 3 11/29/2017   Lab Results  Component Value Date   ALT 27 11/29/2017   AST 20 11/29/2017   ALKPHOS 72 11/29/2017   BILITOT 0.4 11/29/2017          Relevant Medications   hydrochlorothiazide (HYDRODIURIL) 12.5 MG tablet   simvastatin (ZOCOR) 40 MG tablet      I have discontinued Emily Ryan's azithromycin. I have also changed her simvastatin. Additionally, I am having her start on oseltamivir, promethazine, and levofloxacin. Lastly, I am having her maintain her vitamin E, multivitamin, methotrexate, PRECISION THIN LANCETS, folic acid, Calcium Carb-Cholecalciferol, aspirin, escitalopram, calcium carbonate, vitamin C, glucose blood, omeprazole, loratadine, montelukast, Dulaglutide, lisinopril, metFORMIN, ezetimibe, glipiZIDE, and hydrochlorothiazide.  Meds ordered this encounter  Medications  . oseltamivir (TAMIFLU) 75 MG capsule    Sig: Take 1 capsule (75 mg total) by mouth daily.    Dispense:  10 capsule    Refill:  0  . glipiZIDE (GLUCOTROL) 5 MG tablet    Sig: Take 1 tablet (5 mg total) by mouth 2 (two) times daily before a meal.    Dispense:  180 tablet    Refill:  3  . hydrochlorothiazide (HYDRODIURIL) 12.5 MG tablet    Sig: Take 1 tablet (12.5 mg total) by mouth daily.    Dispense:  90 tablet    Refill:  3  . simvastatin (ZOCOR) 40 MG tablet    Sig: Take 1 tablet (40 mg total) by mouth daily.    Dispense:  90 tablet    Refill:  3  . promethazine (PHENERGAN) 25 MG tablet    Sig: Take 1 tablet (25 mg total) by mouth every 8 (eight) hours as  needed for nausea or vomiting.    Dispense:  20 tablet    Refill:  0  . levofloxacin (LEVAQUIN) 500 MG tablet    Sig: Take 1  tablet (500 mg total) by mouth daily.    Dispense:  7 tablet    Refill:  0    Medications Discontinued During This Encounter  Medication Reason  . azithromycin (ZITHROMAX) 250 MG tablet Completed Course  . glipiZIDE (GLUCOTROL) 5 MG tablet Reorder  . hydrochlorothiazide (HYDRODIURIL) 12.5 MG tablet Reorder  . simvastatin (ZOCOR) 40 MG tablet Reorder    Follow-up: Return in about 4 months (around 04/07/2018) for follow up diabetes.   Crecencio Mc, MD

## 2017-12-09 DIAGNOSIS — S82891D Other fracture of right lower leg, subsequent encounter for closed fracture with routine healing: Secondary | ICD-10-CM

## 2017-12-09 HISTORY — DX: Other fracture of right lower leg, subsequent encounter for closed fracture with routine healing: S82.891D

## 2017-12-09 NOTE — Assessment & Plan Note (Addendum)
Well controlled on lisinopril and hctz.   Renal function stable, no changes today.  Lab Results  Component Value Date   CREATININE 0.64 11/29/2017   Lab Results  Component Value Date   NA 137 11/29/2017   K 4.4 11/29/2017   CL 99 11/29/2017   CO2 31 11/29/2017

## 2017-12-09 NOTE — Assessment & Plan Note (Addendum)
  Slight loss of  control due to recent injury resulting in suspension of exercise.  Continue meds.  Low GI  diet. No changes today .  Advised to check post prandials for he next several weeks   Lab Results  Component Value Date   HGBA1C 7.4 (H) 11/29/2017  ` Lab Results  Component Value Date   MICROALBUR 1.1 11/29/2017

## 2017-12-09 NOTE — Assessment & Plan Note (Addendum)
LDL and triglycerides are at goal on simvastatin/zetia . She has no side effects and liver enzymes are normal. No changes today. Continue asa for primary prevention   Lab Results  Component Value Date   CHOL 149 11/29/2017   HDL 53.40 11/29/2017   LDLCALC 76 11/29/2017   LDLDIRECT 104.0 05/04/2015   TRIG 102.0 11/29/2017   CHOLHDL 3 11/29/2017   Lab Results  Component Value Date   ALT 27 11/29/2017   AST 20 11/29/2017   ALKPHOS 72 11/29/2017   BILITOT 0.4 11/29/2017

## 2017-12-09 NOTE — Assessment & Plan Note (Addendum)
Symptoms have nearly completely resolved after treatment with azithromycin and steroids., except for persistent intermittent non productive  cough.  Tessalon perles prescribed

## 2017-12-09 NOTE — Assessment & Plan Note (Signed)
She is now full weight bearing,  Using a brace with walking.

## 2017-12-11 NOTE — Telephone Encounter (Signed)
LMTCB. Need to schedule pt a lab appt a few days before her appt with Dr. Derrel Nip on 04/10/2017. This is for fasting labs. Labs have been ordered. PEC may speak with pt.

## 2017-12-13 NOTE — Telephone Encounter (Signed)
LMTCB. PEC may speak with pt.  

## 2017-12-20 DIAGNOSIS — C44719 Basal cell carcinoma of skin of left lower limb, including hip: Secondary | ICD-10-CM | POA: Diagnosis not present

## 2017-12-21 ENCOUNTER — Ambulatory Visit: Payer: Self-pay | Admitting: General Surgery

## 2017-12-22 ENCOUNTER — Encounter: Payer: Self-pay | Admitting: Pulmonary Disease

## 2017-12-22 ENCOUNTER — Ambulatory Visit (INDEPENDENT_AMBULATORY_CARE_PROVIDER_SITE_OTHER): Payer: Medicare Other | Admitting: Pulmonary Disease

## 2017-12-22 VITALS — BP 116/64 | HR 83 | Ht 58.5 in | Wt 138.0 lb

## 2017-12-22 DIAGNOSIS — J301 Allergic rhinitis due to pollen: Secondary | ICD-10-CM | POA: Diagnosis not present

## 2017-12-22 DIAGNOSIS — Z5181 Encounter for therapeutic drug level monitoring: Secondary | ICD-10-CM

## 2017-12-22 DIAGNOSIS — J209 Acute bronchitis, unspecified: Secondary | ICD-10-CM | POA: Diagnosis not present

## 2017-12-22 NOTE — Patient Instructions (Signed)
Recurrent episodes of cough with methotrexate use: I like to get a lung function test to make sure there is no evidence of toxicity from that medicine Please continue to let us know if you have problems with cough, mucus production or bronchitis symptoms  Allergic rhinitis: Continue montelukast and loratadine  Gastroesophageal reflux disease: Keep taking your antacid medicine as you are doing  We will plan on seeing you back in 6 months or sooner if needed

## 2017-12-22 NOTE — Progress Notes (Signed)
Subjective:    Patient ID: Emily Ryan, female    DOB: Jan 10, 1945, 72 y.o.   MRN: [id number]  Synopsis: Emily Ryan first saw the [facility] clinic in 06/2012 for evaluation of annual bronchitis, allergic rhinitis and possible asthma.  Simple spirometry was normal. She did well with step down therapy from Symbicort to Asmanex. She had a methacholine challenge which was normal in July 2014.   HPI  Chief Complaint  Patient presents with  . Follow-up    pt feels much better since last OV,  denies any current cough, mucus production.     Emily Ryan was really sick a few years ago and she had a bad case of bronchitis.  She took a zpack and prednisone and improved.  She says that she coughed up a lot of mucus.  She has been taking methotrexate for a number of years for her psoriasis.  She says her LFT's have been normal.   She says that she developed diabetes.    She had her lisinopril increased recently.     Past Medical History:  Diagnosis Date  . Complicated pregnancy    1st pregnancy complicated by post operative hemorrhage and 2ng complicated by epidural  . Diabetes mellitus   . Diverticulosis of colon (without mention of hemorrhage)   . Guttate psoriasis   . Hyperlipidemia   . Hypertension   . Menopausal disorder      Review of Systems  Constitutional: Negative for chills, fatigue and unexpected weight change.  HENT: Negative for facial swelling, mouth sores, postnasal drip, rhinorrhea and sinus pain.   Respiratory: Negative for cough, shortness of breath and stridor.   Cardiovascular: Negative for chest pain, palpitations and leg swelling.       Objective:   Physical Exam  Vitals:   12/22/17 1528  BP: 116/64  Pulse: 83  SpO2: 96%  Weight: 138 lb (62.6 kg)  Height: 4' 10.5" (1.486 m)  RA  Gen: well appearing HENT: OP clear, TM's clear, neck supple PULM: CTA B, normal percussion CV: RRR, no mgr, trace edema GI: BS+, soft, nontender Derm: no  cyanosis or rash Psyche: normal mood and affect   PFT PFTS done at [facility] May 2014    FVC 2.44L  105% predFEV 1 1.96L (119% predicted) FEV1/FVC : 80 % pred DLCO 111% pred TLC 4.12L  102%  RV 1.66L  112%  07/2012 spiro PFT> ratio 82%, FEV 1 1.98L (105% pred), no change with BD 07/2012 methacholine challenge> no change with ay dose. Negative study  06/2012 ACQ (Asmanex) 0/7     Assessment & Plan:   Therapeutic drug monitoring - Plan: Pulmonary function test  Acute bronchitis, unspecified organism  Non-seasonal allergic rhinitis due to pollen  Discussion: This has been a stable interval for Emily Ryan after treatment for her most recent episode of bronchitis.  Because she tends to continue to have yearly episodes of bronchitis and she has been on methotrexate I would like to get a lung function test to make sure there is no evidence of lung toxicity from that medicine.  She previously had an extensive evaluation for asthma which was negative.  I still think a lot of her cough is related to upper airway symptoms.  Because she does not have cough between episodes of bronchitis I am reluctant to consider any changes to her lisinopril which is done a nice job of controlling her blood pressure.  Recurrent episodes of cough with methotrexate use: I like to get a  lung function test to make sure there is no evidence of toxicity from that medicine Please continue to let us know if you have problems with cough, mucus production or bronchitis symptoms  Allergic rhinitis: Continue montelukast and loratadine  Gastroesophageal reflux disease: Keep taking your antacid medicine as you are doing  We will plan on seeing you back in 6 months or sooner if needed    Current Outpatient Medications:  .  Ascorbic Acid (VITAMIN C) 1000 MG tablet, Take 1,000 mg by mouth., Disp: , Rfl:  .  aspirin 81 MG chewable tablet, Chew 81 mg by mouth daily. , Disp: , Rfl:  .  Calcium Carb-Cholecalciferol 600-100 MG-UNIT  CAPS, Take 1 tablet by mouth daily. Two by mouth daily (Patient taking differently: Take 1 tablet by mouth daily. ), Disp: 90 capsule, Rfl: 3 .  calcium carbonate (CALCIUM 600) 600 MG TABS tablet, Take 600 mg by mouth., Disp: , Rfl:  .  Dulaglutide (TRULICITY) 3.01 SW/1.0XN SOPN, Inject 0.75 mg into the skin once a week., Disp: 13 pen, Rfl: 3 .  escitalopram (LEXAPRO) 5 MG tablet, Take 1 tablet (5 mg total) by mouth daily., Disp: 90 tablet, Rfl: 3 .  ezetimibe (ZETIA) 10 MG tablet, Take 1 tablet (10 mg total) by mouth daily., Disp: 90 tablet, Rfl: 3 .  folic acid (FOLVITE) 1 MG tablet, Take 1 mg by mouth daily., Disp: , Rfl:  .  glipiZIDE (GLUCOTROL) 5 MG tablet, Take 1 tablet (5 mg total) by mouth 2 (two) times daily before a meal., Disp: 180 tablet, Rfl: 3 .  glucose blood (FREESTYLE LITE) test strip, Use as instructed to test blood sugar twice daily, Disp: 200 each, Rfl: 3 .  hydrochlorothiazide (HYDRODIURIL) 12.5 MG tablet, Take 1 tablet (12.5 mg total) by mouth daily., Disp: 90 tablet, Rfl: 3 .  lisinopril (PRINIVIL,ZESTRIL) 40 MG tablet, Take 1 tablet (40 mg total) by mouth daily., Disp: 90 tablet, Rfl: 3 .  loratadine (CLARITIN) 10 MG tablet, Take 1 tablet (10 mg total) by mouth daily., Disp: 90 tablet, Rfl: 3 .  metFORMIN (GLUCOPHAGE XR) 750 MG 24 hr tablet, Take 1 tablet (750 mg total) by mouth daily with breakfast., Disp: 90 tablet, Rfl: 3 .  methotrexate (RHEUMATREX) 2.5 MG tablet, Take 7.5 mg by mouth every Saturday. , Disp: , Rfl:  .  montelukast (SINGULAIR) 10 MG tablet, Take 1 tablet (10 mg total) by mouth at bedtime., Disp: 90 tablet, Rfl: 3 .  Multiple Vitamin (MULTIVITAMIN) capsule, Take 1 capsule by mouth daily.  , Disp: , Rfl:  .  omeprazole (PRILOSEC) 20 MG capsule, Take 1 capsule (20 mg total) by mouth daily., Disp: 90 capsule, Rfl: 3 .  Precision Thin Lancets MISC, Use as directed to check sugars twice daily, Disp: 180 each, Rfl: 3 .  simvastatin (ZOCOR) 40 MG tablet, Take 1  tablet (40 mg total) by mouth daily., Disp: 90 tablet, Rfl: 3 .  vitamin E 400 UNIT capsule, Take 400 Units by mouth daily.  , Disp: , Rfl:

## 2018-01-02 DIAGNOSIS — S8264XA Nondisplaced fracture of lateral malleolus of right fibula, initial encounter for closed fracture: Secondary | ICD-10-CM | POA: Diagnosis not present

## 2018-01-10 ENCOUNTER — Ambulatory Visit (INDEPENDENT_AMBULATORY_CARE_PROVIDER_SITE_OTHER): Payer: Medicare Other | Admitting: Pulmonary Disease

## 2018-01-10 DIAGNOSIS — Z5181 Encounter for therapeutic drug level monitoring: Secondary | ICD-10-CM

## 2018-01-10 LAB — PULMONARY FUNCTION TEST
DL/VA % pred: 132 %
DL/VA: 5.38 ml/min/mmHg/L
DLCO UNC % PRED: 106 %
DLCO unc: 18.72 ml/min/mmHg
FEF 25-75 PRE: 1.78 L/s
FEF 25-75 Post: 2.2 L/sec
FEF2575-%Change-Post: 23 %
FEF2575-%Pred-Post: 148 %
FEF2575-%Pred-Pre: 120 %
FEV1-%Change-Post: 3 %
FEV1-%Pred-Post: 108 %
FEV1-%Pred-Pre: 104 %
FEV1-Post: 1.86 L
FEV1-Pre: 1.8 L
FEV1FVC-%Change-Post: 4 %
FEV1FVC-%Pred-Pre: 110 %
FEV6-%Change-Post: -1 %
FEV6-%Pred-Post: 97 %
FEV6-%Pred-Pre: 98 %
FEV6-Post: 2.13 L
FEV6-Pre: 2.16 L
FEV6FVC-%Pred-Post: 105 %
FEV6FVC-%Pred-Pre: 105 %
FVC-%Change-Post: -1 %
FVC-%Pred-Post: 92 %
FVC-%Pred-Pre: 93 %
FVC-Post: 2.13 L
FVC-Pre: 2.16 L
PRE FEV1/FVC RATIO: 83 %
Post FEV1/FVC ratio: 87 %
Post FEV6/FVC ratio: 100 %
Pre FEV6/FVC Ratio: 100 %
RV % pred: 76 %
RV: 1.53 L
TLC % pred: 93 %
TLC: 4.03 L

## 2018-01-10 NOTE — Progress Notes (Signed)
PFT done today. 

## 2018-01-29 ENCOUNTER — Ambulatory Visit: Payer: Medicare Other | Admitting: Surgery

## 2018-01-31 ENCOUNTER — Other Ambulatory Visit: Payer: Self-pay

## 2018-01-31 ENCOUNTER — Telehealth: Payer: Self-pay | Admitting: *Deleted

## 2018-01-31 ENCOUNTER — Other Ambulatory Visit: Payer: Self-pay | Admitting: Radiology

## 2018-01-31 ENCOUNTER — Telehealth: Payer: Self-pay

## 2018-01-31 ENCOUNTER — Encounter: Payer: Self-pay | Admitting: Surgery

## 2018-01-31 ENCOUNTER — Ambulatory Visit (INDEPENDENT_AMBULATORY_CARE_PROVIDER_SITE_OTHER): Payer: Medicare Other | Admitting: Surgery

## 2018-01-31 VITALS — BP 147/84 | HR 77 | Temp 96.4°F | Resp 12 | Ht 58.5 in | Wt 139.0 lb

## 2018-01-31 DIAGNOSIS — K913 Postprocedural intestinal obstruction, unspecified as to partial versus complete: Secondary | ICD-10-CM

## 2018-01-31 NOTE — Patient Instructions (Addendum)
Patient is to be scheduled for surgery with Dr.Piscoya. Stop taking aspirin 5 days before surgery. The patient will need to have a barium enema.   Call the office with any questions or concerns.

## 2018-01-31 NOTE — Telephone Encounter (Signed)
The patient has been scheduled for a barium enema prior to surgery. She has been informed of date, time, and instructions by Freda Munro.  Patient's surgery has been scheduled for 02-19-18 at [facility] with Dr. Hampton Abbot. Dr. Nestor Lewandowsky will be assisting with this case. Patient will need bilateral ureteral lighted stents (at present Dr. Diamantina Providence will be doing stent placement per Amy at [facility] may be changed based upon surgery time but their office will notify us if so).   Patient will need to complete a formal bowel prep. Medications have been called in to [employer].   The patient has been instructed to stop aspirin 5 days prior to surgery.   The patient is aware to Pre-Admit on 02-12-18 at 11 am. Patient will check in at the [place], [address] (first floor).   The patient is aware to call the office should she have further questions.

## 2018-01-31 NOTE — H&P (View-Only) (Signed)
01/31/2018  Reason for Visit:  Anastomotic stricture  Referring Provider:  Sherri Sear, MD  History of Present Illness: Emily Ryan is a 73 y.o. female seen today for evaluation of anastomotic stricture.  She has a history of open sigmoidectomy with primary anastomosis for diverticulitis with Dr. Leanora Cover in 12/2010.  She has been recently having issues with chronic constipation and had a colonoscopy with Dr. Marius Ditch on 07/17/2017 which found a stricture at 10 cm proximal to the anus.  This was dilated, and on follow up flexible sigmoidoscopy on 07/31/17, she again had a non-patent anastomosis at 12 cm from the anus.  This was dilated once again.  Repeat colonoscopy on 11/02/17 showed again an anastomotic stricture marked at 20 cm from the anus which was dilated to 12 mm.  She has had polypectomies during these procedures which have returned as tubular adenomas negative for high-grade dysplasia or malignancy.  Due to her persistent stricture, she was referred to surgery for further evaluation.  She reports she takes fiber and has tried Miralax but she cannot tolerate well.  She does have to strain for a bowel movement and has some abdominal discomfort.  Denies any blood in the stool.  Reports that she has not noticed any significant improvement in her constipation after her dilatation procedures.  Past Medical History: Past Medical History:  Diagnosis Date  . Complicated pregnancy    1st pregnancy complicated by post operative hemorrhage and 2ng complicated by epidural  . Diabetes mellitus   . Diverticulosis of colon (without mention of hemorrhage)   . Guttate psoriasis   . Hyperlipidemia   . Hypertension   . Menopausal disorder   . Skin cancer      Past Surgical History: Past Surgical History:  Procedure Laterality Date  . ABDOMINAL HYSTERECTOMY    . APPENDECTOMY  Dec 2012  . BLADDER REPAIR  1991   lift   . COLONOSCOPY WITH PROPOFOL N/A 07/17/2017   Procedure: COLONOSCOPY WITH  PROPOFOL;  Surgeon: Lin Landsman, MD;  Location: [facility] ENDOSCOPY;  Service: Gastroenterology;  Laterality: N/A;  . COLONOSCOPY WITH PROPOFOL N/A 11/02/2017   Procedure: COLONOSCOPY WITH PROPOFOL;  Surgeon: Lin Landsman, MD;  Location: [facility] ENDOSCOPY;  Service: Gastroenterology;  Laterality: N/A;  . CYSTOCELE REPAIR    . FLEXIBLE SIGMOIDOSCOPY N/A 07/31/2017   Procedure: FLEXIBLE SIGMOIDOSCOPY;  Surgeon: Lin Landsman, MD;  Location: [facility] ENDOSCOPY;  Service: Gastroenterology;  Laterality: N/A;  . HERNIA REPAIR    . MOHS SURGERY N/A 05/2015   Face  . PARTIAL COLECTOMY  Nov 2012   left, secondary to diverticular perf  . RECTOCELE REPAIR    . SEPTOPLASTY  1969  . TEE WITHOUT CARDIOVERSION N/A 11/24/2015   Procedure: TRANSESOPHAGEAL ECHOCARDIOGRAM (TEE);  Surgeon: Minna Merritts, MD;  Location: [facility] ORS;  Service: Cardiovascular;  Laterality: N/A;  . TONSILLECTOMY  1953  . VARICOSE VEIN SURGERY  1974   right leg     Home Medications: Prior to Admission medications   Medication Sig Start Date End Date Taking? Authorizing Provider  Ascorbic Acid (VITAMIN C) 1000 MG tablet Take 1,000 mg by mouth.   Yes [provider]  aspirin 81 MG chewable tablet Chew 81 mg by mouth daily.    Yes [provider]  Calcium Carb-Cholecalciferol 600-100 MG-UNIT CAPS Take 1 tablet by mouth daily. Two by mouth daily Patient taking differently: Take 1 tablet by mouth daily.  11/07/12  Yes Crecencio Mc, MD  Dulaglutide (TRULICITY) 7.90  MG/0.5ML SOPN Inject 0.75 mg into the skin once a week. 08/03/17  Yes Crecencio Mc, MD  escitalopram (LEXAPRO) 5 MG tablet Take 1 tablet (5 mg total) by mouth daily. 03/03/17  Yes Einar Pheasant, MD  ezetimibe (ZETIA) 10 MG tablet Take 1 tablet (10 mg total) by mouth daily. 11/24/17  Yes Crecencio Mc, MD  folic acid (FOLVITE) 1 MG tablet Take 1 mg by mouth daily.   Yes [provider]  glipiZIDE (GLUCOTROL) 5 MG tablet Take 1 tablet  (5 mg total) by mouth 2 (two) times daily before a meal. 12/06/17  Yes Crecencio Mc, MD  glucose blood (FREESTYLE LITE) test strip Use as instructed to test blood sugar twice daily 08/03/17  Yes Crecencio Mc, MD  hydrochlorothiazide (HYDRODIURIL) 12.5 MG tablet Take 1 tablet (12.5 mg total) by mouth daily. 12/06/17  Yes Crecencio Mc, MD  lisinopril (PRINIVIL,ZESTRIL) 40 MG tablet Take 1 tablet (40 mg total) by mouth daily. 08/03/17  Yes Crecencio Mc, MD  loratadine (CLARITIN) 10 MG tablet Take 1 tablet (10 mg total) by mouth daily. 08/03/17  Yes Crecencio Mc, MD  metFORMIN (GLUCOPHAGE XR) 750 MG 24 hr tablet Take 1 tablet (750 mg total) by mouth daily with breakfast. 11/01/17  Yes Crecencio Mc, MD  methotrexate (RHEUMATREX) 2.5 MG tablet Take 7.5 mg by mouth every Saturday.    Yes [provider]  montelukast (SINGULAIR) 10 MG tablet Take 1 tablet (10 mg total) by mouth at bedtime. 08/03/17  Yes Crecencio Mc, MD  Multiple Vitamin (MULTIVITAMIN) capsule Take 1 capsule by mouth daily.     Yes [provider]  omeprazole (PRILOSEC) 20 MG capsule Take 1 capsule (20 mg total) by mouth daily. 08/03/17  Yes Crecencio Mc, MD  Precision Thin Lancets MISC Use as directed to check sugars twice daily 01/27/12  Yes Crecencio Mc, MD  simvastatin (ZOCOR) 40 MG tablet Take 1 tablet (40 mg total) by mouth daily. 12/06/17  Yes Crecencio Mc, MD  vitamin E 400 UNIT capsule Take 400 Units by mouth daily.     Yes [provider]    Allergies: No Known Allergies  Social History:  reports that she has never smoked. She has never used smokeless tobacco. She reports current alcohol use. She reports that she does not use drugs.   Family History: Family History  Problem Relation Age of Onset  . Heart Problems Mother   . Macular degeneration Mother   . Cancer Father   . Cancer Maternal Grandmother        colon ca  . Diabetes Sister   . Dementia Sister   . Diabetes Brother    . Stroke Brother   . Diabetes Brother   . Breast cancer Maternal Aunt   . Breast cancer Paternal Aunt   . Lung cancer Cousin   . Lung disease Neg Hx   . Rheumatologic disease Neg Hx     Review of Systems: Review of Systems  Constitutional: Negative for chills and fever.  HENT: Negative for hearing loss.   Respiratory: Negative for shortness of breath.   Cardiovascular: Negative for chest pain.  Gastrointestinal: Positive for constipation. Negative for abdominal pain, blood in stool, nausea and vomiting.  Genitourinary: Negative for dysuria.  Musculoskeletal: Negative for myalgias.  Skin: Negative for rash.  Neurological: Negative for dizziness.  Psychiatric/Behavioral: Negative for depression.    Physical Exam BP (!) 147/84   Pulse 77  Temp (!) 96.4 F (35.8 C) (Temporal)   Resp 12   Ht 4' 10.5" (1.486 m)   Wt 139 lb (63 kg)   SpO2 94%   BMI 28.56 kg/m  CONSTITUTIONAL: No acute distress HEENT:  Normocephalic, atraumatic, extraocular motion intact. NECK: Trachea is midline, and there is no jugular venous distension.  RESPIRATORY:  Lungs are clear, and breath sounds are equal bilaterally. Normal respiratory effort without pathologic use of accessory muscles. CARDIOVASCULAR: Heart is regular without murmurs, gallops, or rubs. GI: The abdomen is soft, non-distended, non-tender to palpation. Low abdominal incision is well healed, without any evident hernia.   MUSCULOSKELETAL:  Normal muscle strength and tone in all four extremities.  No peripheral edema or cyanosis. SKIN: Skin turgor is normal. There are no pathologic skin lesions.  NEUROLOGIC:  Motor and sensation is grossly normal.  Cranial nerves are grossly intact. PSYCH:  Alert and oriented to person, place and time. Affect is normal.  Laboratory Analysis: Labs from 11/29/17 overall unremarkable.  Imaging: None recently  Assessment and Plan: This is a 73 y.o. female with anastomotic stricture following  sigmoidectomy for diverticulitis in 2012.  Discussed with the patient that she has a stricture at the prior anastomosis, and despite her repeated dilatation procedures, it keeps recurring.  She denies any significant improvement with these procedures.  At this point, the next treatment option involves surgery.  Discussed with her my plan for a laparoscopic resection of her anastomosis with primary colorectal anastomosis.  Discussed with her that given her prior surgeries, there is a chance that we would have to convert to open if there are any complications or if there is too much scar tissue that would make her surgery unsafe.  Discussed post-op outcomes and restrictions, hospital length of stay, and preoperative testing and bowel prep.  Given the different distances from anal verge (also virtual CT colonoscopy mentions 34-40 cm from anal verge), will obtain a barium enema to see where exactly her stricture is.  If it is more in a low rectum area, then perhaps she would benefit from a colorectal surgery.  I doubt that will be the case, but I do want to plan better for surgery with her.  She understands this plan and all of her questions have been answered.  She would not have to stop her Methotrexate for her psoriasis, but she would stop her Aspirin 5 days prior to surgery.  Also, we will plan for Urology to assist with placement of ureteral lighted stents.  Tentatively we will schedule her for 02/19/18.  Face-to-face time spent with the patient and care providers was 60 minutes, with more than 50% of the time spent counseling, educating, and coordinating care of the patient.     Melvyn Neth, [facility]

## 2018-01-31 NOTE — Telephone Encounter (Signed)
Patient notified that pharmacy does not have erythromycin and we have called in Flagyl in it's place. Ok per Dr. Hampton Abbot.  The patient verbalizes understanding.

## 2018-01-31 NOTE — Progress Notes (Signed)
01/31/2018  Reason for Visit:  Anastomotic stricture  Referring Provider:  Sherri Sear, MD  History of Present Illness: Emily Ryan is a 73 y.o. female seen today for evaluation of anastomotic stricture.  She has a history of open sigmoidectomy with primary anastomosis for diverticulitis with Dr. Leanora Cover in 12/2010.  She has been recently having issues with chronic constipation and had a colonoscopy with Dr. Marius Ditch on 07/17/2017 which found a stricture at 10 cm proximal to the anus.  This was dilated, and on follow up flexible sigmoidoscopy on 07/31/17, she again had a non-patent anastomosis at 12 cm from the anus.  This was dilated once again.  Repeat colonoscopy on 11/02/17 showed again an anastomotic stricture marked at 20 cm from the anus which was dilated to 12 mm.  She has had polypectomies during these procedures which have returned as tubular adenomas negative for high-grade dysplasia or malignancy.  Due to her persistent stricture, she was referred to surgery for further evaluation.  She reports she takes fiber and has tried Miralax but she cannot tolerate well.  She does have to strain for a bowel movement and has some abdominal discomfort.  Denies any blood in the stool.  Reports that she has not noticed any significant improvement in her constipation after her dilatation procedures.  Past Medical History: Past Medical History:  Diagnosis Date  . Complicated pregnancy    1st pregnancy complicated by post operative hemorrhage and 2ng complicated by epidural  . Diabetes mellitus   . Diverticulosis of colon (without mention of hemorrhage)   . Guttate psoriasis   . Hyperlipidemia   . Hypertension   . Menopausal disorder   . Skin cancer      Past Surgical History: Past Surgical History:  Procedure Laterality Date  . ABDOMINAL HYSTERECTOMY    . APPENDECTOMY  Dec 2012  . BLADDER REPAIR  1991   lift   . COLONOSCOPY WITH PROPOFOL N/A 07/17/2017   Procedure: COLONOSCOPY WITH  PROPOFOL;  Surgeon: Lin Landsman, MD;  Location: [facility] ENDOSCOPY;  Service: Gastroenterology;  Laterality: N/A;  . COLONOSCOPY WITH PROPOFOL N/A 11/02/2017   Procedure: COLONOSCOPY WITH PROPOFOL;  Surgeon: Lin Landsman, MD;  Location: [facility] ENDOSCOPY;  Service: Gastroenterology;  Laterality: N/A;  . CYSTOCELE REPAIR    . FLEXIBLE SIGMOIDOSCOPY N/A 07/31/2017   Procedure: FLEXIBLE SIGMOIDOSCOPY;  Surgeon: Lin Landsman, MD;  Location[facility] ENDOSCOPY;  Service: Gastroenterology;  Laterality: N/A;  . HERNIA REPAIR    . MOHS SURGERY N/A 05/2015   Face  . PARTIAL COLECTOMY  Nov 2012   left, secondary to diverticular perf  . RECTOCELE REPAIR    . SEPTOPLASTY  1969  . TEE WITHOUT CARDIOVERSION N/A 11/24/2015   Procedure: TRANSESOPHAGEAL ECHOCARDIOGRAM (TEE);  Surgeon: Minna Merritts, MD;  Location: [facility] ORS;  Service: Cardiovascular;  Laterality: N/A;  . TONSILLECTOMY  1953  . VARICOSE VEIN SURGERY  1974   right leg     Home Medications: Prior to Admission medications   Medication Sig Start Date End Date Taking? Authorizing Provider  Ascorbic Acid (VITAMIN C) 1000 MG tablet Take 1,000 mg by mouth.   Yes [provider]  aspirin 81 MG chewable tablet Chew 81 mg by mouth daily.    Yes [provider]  Calcium Carb-Cholecalciferol 600-100 MG-UNIT CAPS Take 1 tablet by mouth daily. Two by mouth daily Patient taking differently: Take 1 tablet by mouth daily.  11/07/12  Yes Crecencio Mc, MD  Dulaglutide (TRULICITY) 8.29  MG/0.5ML SOPN Inject 0.75 mg into the skin once a week. 08/03/17  Yes Crecencio Mc, MD  escitalopram (LEXAPRO) 5 MG tablet Take 1 tablet (5 mg total) by mouth daily. 03/03/17  Yes Einar Pheasant, MD  ezetimibe (ZETIA) 10 MG tablet Take 1 tablet (10 mg total) by mouth daily. 11/24/17  Yes Crecencio Mc, MD  folic acid (FOLVITE) 1 MG tablet Take 1 mg by mouth daily.   Yes [provider]  glipiZIDE (GLUCOTROL) 5 MG tablet Take 1 tablet  (5 mg total) by mouth 2 (two) times daily before a meal. 12/06/17  Yes Crecencio Mc, MD  glucose blood (FREESTYLE LITE) test strip Use as instructed to test blood sugar twice daily 08/03/17  Yes Crecencio Mc, MD  hydrochlorothiazide (HYDRODIURIL) 12.5 MG tablet Take 1 tablet (12.5 mg total) by mouth daily. 12/06/17  Yes Crecencio Mc, MD  lisinopril (PRINIVIL,ZESTRIL) 40 MG tablet Take 1 tablet (40 mg total) by mouth daily. 08/03/17  Yes Crecencio Mc, MD  loratadine (CLARITIN) 10 MG tablet Take 1 tablet (10 mg total) by mouth daily. 08/03/17  Yes Crecencio Mc, MD  metFORMIN (GLUCOPHAGE XR) 750 MG 24 hr tablet Take 1 tablet (750 mg total) by mouth daily with breakfast. 11/01/17  Yes Crecencio Mc, MD  methotrexate (RHEUMATREX) 2.5 MG tablet Take 7.5 mg by mouth every Saturday.    Yes [provider]  montelukast (SINGULAIR) 10 MG tablet Take 1 tablet (10 mg total) by mouth at bedtime. 08/03/17  Yes Crecencio Mc, MD  Multiple Vitamin (MULTIVITAMIN) capsule Take 1 capsule by mouth daily.     Yes [provider]  omeprazole (PRILOSEC) 20 MG capsule Take 1 capsule (20 mg total) by mouth daily. 08/03/17  Yes Crecencio Mc, MD  Precision Thin Lancets MISC Use as directed to check sugars twice daily 01/27/12  Yes Crecencio Mc, MD  simvastatin (ZOCOR) 40 MG tablet Take 1 tablet (40 mg total) by mouth daily. 12/06/17  Yes Crecencio Mc, MD  vitamin E 400 UNIT capsule Take 400 Units by mouth daily.     Yes [provider]    Allergies: No Known Allergies  Social History:  reports that she has never smoked. She has never used smokeless tobacco. She reports current alcohol use. She reports that she does not use drugs.   Family History: Family History  Problem Relation Age of Onset  . Heart Problems Mother   . Macular degeneration Mother   . Cancer Father   . Cancer Maternal Grandmother        colon ca  . Diabetes Sister   . Dementia Sister   . Diabetes Brother    . Stroke Brother   . Diabetes Brother   . Breast cancer Maternal Aunt   . Breast cancer Paternal Aunt   . Lung cancer Cousin   . Lung disease Neg Hx   . Rheumatologic disease Neg Hx     Review of Systems: Review of Systems  Constitutional: Negative for chills and fever.  HENT: Negative for hearing loss.   Respiratory: Negative for shortness of breath.   Cardiovascular: Negative for chest pain.  Gastrointestinal: Positive for constipation. Negative for abdominal pain, blood in stool, nausea and vomiting.  Genitourinary: Negative for dysuria.  Musculoskeletal: Negative for myalgias.  Skin: Negative for rash.  Neurological: Negative for dizziness.  Psychiatric/Behavioral: Negative for depression.    Physical Exam BP (!) 147/84   Pulse 77  Temp (!) 96.4 F (35.8 C) (Temporal)   Resp 12   Ht 4' 10.5" (1.486 m)   Wt 139 lb (63 kg)   SpO2 94%   BMI 28.56 kg/m  CONSTITUTIONAL: No acute distress HEENT:  Normocephalic, atraumatic, extraocular motion intact. NECK: Trachea is midline, and there is no jugular venous distension.  RESPIRATORY:  Lungs are clear, and breath sounds are equal bilaterally. Normal respiratory effort without pathologic use of accessory muscles. CARDIOVASCULAR: Heart is regular without murmurs, gallops, or rubs. GI: The abdomen is soft, non-distended, non-tender to palpation. Low abdominal incision is well healed, without any evident hernia.   MUSCULOSKELETAL:  Normal muscle strength and tone in all four extremities.  No peripheral edema or cyanosis. SKIN: Skin turgor is normal. There are no pathologic skin lesions.  NEUROLOGIC:  Motor and sensation is grossly normal.  Cranial nerves are grossly intact. PSYCH:  Alert and oriented to person, place and time. Affect is normal.  Laboratory Analysis: Labs from 11/29/17 overall unremarkable.  Imaging: None recently  Assessment and Plan: This is a 73 y.o. female with anastomotic stricture following  sigmoidectomy for diverticulitis in 2012.  Discussed with the patient that she has a stricture at the prior anastomosis, and despite her repeated dilatation procedures, it keeps recurring.  She denies any significant improvement with these procedures.  At this point, the next treatment option involves surgery.  Discussed with her my plan for a laparoscopic resection of her anastomosis with primary colorectal anastomosis.  Discussed with her that given her prior surgeries, there is a chance that we would have to convert to open if there are any complications or if there is too much scar tissue that would make her surgery unsafe.  Discussed post-op outcomes and restrictions, hospital length of stay, and preoperative testing and bowel prep.  Given the different distances from anal verge (also virtual CT colonoscopy mentions 34-40 cm from anal verge), will obtain a barium enema to see where exactly her stricture is.  If it is more in a low rectum area, then perhaps she would benefit from a colorectal surgery.  I doubt that will be the case, but I do want to plan better for surgery with her.  She understands this plan and all of her questions have been answered.  She would not have to stop her Methotrexate for her psoriasis, but she would stop her Aspirin 5 days prior to surgery.  Also, we will plan for Urology to assist with placement of ureteral lighted stents.  Tentatively we will schedule her for 02/19/18.  Face-to-face time spent with the patient and care providers was 60 minutes, with more than 50% of the time spent counseling, educating, and coordinating care of the patient.     Melvyn Neth, [facility]

## 2018-01-31 NOTE — Telephone Encounter (Signed)
Patient notified of the below.   Barium Enema IMG 2603 per Sharyn Lull B.  02/14/2018 @ 9 am [facility] Radiology Department.  Patient instructed to pick up prep kit several days prior to study being done.  Patient verbalized understanding.

## 2018-02-01 ENCOUNTER — Ambulatory Visit: Payer: Self-pay | Admitting: General Surgery

## 2018-02-07 ENCOUNTER — Other Ambulatory Visit: Payer: Self-pay | Admitting: *Deleted

## 2018-02-07 ENCOUNTER — Encounter: Payer: Self-pay | Admitting: Surgery

## 2018-02-07 NOTE — Progress Notes (Signed)
Trilyte Prep kit called in to patient's pharmacy today for BE prep per radiology. This is scheduled for 02-14-18.  Ok per Dr. Hampton Abbot.

## 2018-02-12 ENCOUNTER — Encounter
Admission: RE | Admit: 2018-02-12 | Discharge: 2018-02-12 | Disposition: A | Discharge status: Home / Self Care | Payer: Medicare Other | Source: Ambulatory Visit | Attending: Surgery | Admitting: Surgery

## 2018-02-12 ENCOUNTER — Other Ambulatory Visit: Payer: Self-pay

## 2018-02-12 DIAGNOSIS — Z01818 Encounter for other preprocedural examination: Secondary | ICD-10-CM | POA: Insufficient documentation

## 2018-02-12 DIAGNOSIS — I10 Essential (primary) hypertension: Secondary | ICD-10-CM | POA: Diagnosis not present

## 2018-02-12 DIAGNOSIS — K913 Postprocedural intestinal obstruction, unspecified as to partial versus complete: Secondary | ICD-10-CM | POA: Diagnosis not present

## 2018-02-12 HISTORY — DX: Gastro-esophageal reflux disease without esophagitis: K21.9

## 2018-02-12 LAB — CBC WITH DIFFERENTIAL/PLATELET
Abs Immature Granulocytes: 0.02 10*3/uL (ref 0.00–0.07)
BASOS PCT: 1 %
Basophils Absolute: 0 10*3/uL (ref 0.0–0.1)
Eosinophils Absolute: 0.1 10*3/uL (ref 0.0–0.5)
Eosinophils Relative: 2 %
HCT: 41.8 % (ref 36.0–46.0)
Hemoglobin: 14.3 g/dL (ref 12.0–15.0)
Immature Granulocytes: 0 %
Lymphocytes Relative: 35 %
Lymphs Abs: 1.9 10*3/uL (ref 0.7–4.0)
MCH: 33.3 pg (ref 26.0–34.0)
MCHC: 34.2 g/dL (ref 30.0–36.0)
MCV: 97.2 fL (ref 80.0–100.0)
MONO ABS: 0.6 10*3/uL (ref 0.1–1.0)
MONOS PCT: 11 %
Neutro Abs: 2.8 10*3/uL (ref 1.7–7.7)
Neutrophils Relative %: 51 %
Platelets: 272 10*3/uL (ref 150–400)
RBC: 4.3 MIL/uL (ref 3.87–5.11)
RDW: 12.8 % (ref 11.5–15.5)
WBC: 5.4 10*3/uL (ref 4.0–10.5)
nRBC: 0 % (ref 0.0–0.2)

## 2018-02-12 LAB — TYPE AND SCREEN
ABO/RH(D): B NEG
Antibody Screen: NEGATIVE

## 2018-02-12 LAB — COMPREHENSIVE METABOLIC PANEL
ALT: 45 U/L — ABNORMAL HIGH (ref 0–44)
AST: 34 U/L (ref 15–41)
Albumin: 4.3 g/dL (ref 3.5–5.0)
Alkaline Phosphatase: 76 U/L (ref 38–126)
Anion gap: 8 (ref 5–15)
BUN: 9 mg/dL (ref 8–23)
CHLORIDE: 100 mmol/L (ref 98–111)
CO2: 29 mmol/L (ref 22–32)
CREATININE: 0.51 mg/dL (ref 0.44–1.00)
Calcium: 9.1 mg/dL (ref 8.9–10.3)
GFR calc Af Amer: >60 mL/min (ref >60)
GFR calc non Af Amer: >60 mL/min (ref >60)
Glucose, Bld: 127 mg/dL — ABNORMAL HIGH (ref 70–99)
Potassium: 3.6 mmol/L (ref 3.5–5.1)
Sodium: 137 mmol/L (ref 135–145)
Total Bilirubin: 0.8 mg/dL (ref 0.3–1.2)
Total Protein: 7 g/dL (ref 6.5–8.1)

## 2018-02-12 NOTE — Patient Instructions (Signed)
Your procedure is scheduled on: MON 02/19/2018 Report to [place]. To find out your arrival time please call [phone] between 1PM - 3PM on FRI 02/16/2018.  Remember: Instructions that are not followed completely may result in serious medical risk, up to and including death, or upon the discretion of your surgeon and anesthesiologist your surgery may need to be rescheduled.     _X__ 1. Do not eat food after midnight the night before your procedure.                 No gum chewing or hard candies. You may drink clear liquids up to 2 hours                 before you are scheduled to arrive for your surgery- DO not drink clear                 liquids within 2 hours of the start of your surgery.                 Clear Liquids include:  water, apple juice without pulp, clear carbohydrate                 drink such as Clearfast or Gatorade, Black Coffee or Tea (Do not add                 anything to coffee or tea).  __X__2.  On the morning of surgery brush your teeth with toothpaste and water, you                 may rinse your mouth with mouthwash if you wish.  Do not swallow any              toothpaste of mouthwash.     _X__ 3.  No Alcohol for 24 hours before or after surgery.   _X__ 4.  Do Not Smoke or use e-cigarettes For 24 Hours Prior to Your Surgery.                 Do not use any chewable tobacco products for at least 6 hours prior to                 surgery.  ____  5.  Bring all medications with you on the day of surgery if instructed.   __X__  6.  Notify your doctor if there is any change in your medical condition      (cold, fever, infections).     Do not wear jewelry, make-up, hairpins, clips or nail polish. Do not wear lotions, powders, or perfumes.  Do not shave 48 hours prior to surgery. Men may shave face and neck. Do not bring valuables to the hospital.    [facility] is not responsible for any belongings or  valuables.  Contacts, dentures/partials or body piercings may not be worn into surgery. Bring a case for your contacts, glasses or hearing aids, a denture cup will be supplied. Leave your suitcase in the car. After surgery it may be brought to your room. For patients admitted to the hospital, discharge time is determined by your treatment team.   Patients discharged the day of surgery will not be allowed to drive home.   Please read over the following fact sheets that you were given:   MRSA Information  __X__ Take these medicines the morning of surgery with A SIP OF WATER:  1. ezetimibe (ZETIA)  2. loratadine (CLARITIN)  3. omeprazole (PRILOSEC)  4.  5.  6.  ____ Fleet Enema (as directed)   __X__ Use CHG Soap/SAGE wipes as directed  ____ Use inhalers on the day of surgery  __X__ Stop metformin/Janumet/Farxiga 2 days prior to surgery    ____ Take 1/2 of usual insulin dose the night before surgery. No insulin the morning          of surgery.   ____ Stop Blood Thinners Coumadin/Plavix/Xarelto/Pleta/Pradaxa/Eliquis/Effient/Aspirin  on   Or contact your Surgeon, Cardiologist or Medical Doctor regarding  ability to stop your blood thinners  __X__ Stop Anti-inflammatories 7 days before surgery such as Advil, Ibuprofen, Motrin,  BC or Goodies Powder, Naprosyn, Naproxen, Aleve, Aspirin    __X__ Stop all herbal supplements, fish oil or vitamin E until after surgery. (FISH OIL, VIT E, VIT C)   ____ Bring C-Pap to the hospital.

## 2018-02-14 ENCOUNTER — Ambulatory Visit
Admission: RE | Admit: 2018-02-14 | Discharge: 2018-02-14 | Disposition: A | Discharge status: Home / Self Care | Payer: Medicare Other | Source: Ambulatory Visit | Attending: Surgery | Admitting: Surgery

## 2018-02-14 DIAGNOSIS — K56609 Unspecified intestinal obstruction, unspecified as to partial versus complete obstruction: Secondary | ICD-10-CM | POA: Diagnosis not present

## 2018-02-14 DIAGNOSIS — K913 Postprocedural intestinal obstruction, unspecified as to partial versus complete: Secondary | ICD-10-CM | POA: Insufficient documentation

## 2018-02-18 MED ORDER — SODIUM CHLORIDE 0.9 % IV SOLN
2.0000 g | INTRAVENOUS | Status: AC
  Administered 2018-02-19: 2 g via INTRAVENOUS
  Filled 2018-02-18: qty 2

## 2018-02-19 ENCOUNTER — Encounter
Admission: RE | Disposition: A | Discharge status: Home / Self Care | Payer: Self-pay | Source: Home / Self Care | Attending: Surgery

## 2018-02-19 ENCOUNTER — Other Ambulatory Visit: Payer: Self-pay

## 2018-02-19 ENCOUNTER — Inpatient Hospital Stay: Payer: Medicare Other | Admitting: Certified Registered"

## 2018-02-19 ENCOUNTER — Inpatient Hospital Stay
Admission: RE | Admit: 2018-02-19 | Discharge: 2018-02-22 | DRG: 330 | Disposition: A | Discharge status: Home Health | Payer: Medicare Other | Attending: Surgery | Admitting: Surgery

## 2018-02-19 ENCOUNTER — Inpatient Hospital Stay: Payer: Medicare Other

## 2018-02-19 ENCOUNTER — Encounter: Payer: Self-pay | Admitting: *Deleted

## 2018-02-19 DIAGNOSIS — Z466 Encounter for fitting and adjustment of urinary device: Secondary | ICD-10-CM | POA: Diagnosis not present

## 2018-02-19 DIAGNOSIS — E119 Type 2 diabetes mellitus without complications: Secondary | ICD-10-CM | POA: Diagnosis present

## 2018-02-19 DIAGNOSIS — K5900 Constipation, unspecified: Secondary | ICD-10-CM | POA: Diagnosis present

## 2018-02-19 DIAGNOSIS — Z800 Family history of malignant neoplasm of digestive organs: Secondary | ICD-10-CM | POA: Diagnosis not present

## 2018-02-19 DIAGNOSIS — Z833 Family history of diabetes mellitus: Secondary | ICD-10-CM

## 2018-02-19 DIAGNOSIS — K624 Stenosis of anus and rectum: Secondary | ICD-10-CM | POA: Diagnosis present

## 2018-02-19 DIAGNOSIS — K219 Gastro-esophageal reflux disease without esophagitis: Secondary | ICD-10-CM | POA: Diagnosis present

## 2018-02-19 DIAGNOSIS — Z79899 Other long term (current) drug therapy: Secondary | ICD-10-CM

## 2018-02-19 DIAGNOSIS — Z419 Encounter for procedure for purposes other than remedying health state, unspecified: Secondary | ICD-10-CM

## 2018-02-19 DIAGNOSIS — Z5331 Laparoscopic surgical procedure converted to open procedure: Secondary | ICD-10-CM

## 2018-02-19 DIAGNOSIS — K660 Peritoneal adhesions (postprocedural) (postinfection): Secondary | ICD-10-CM | POA: Diagnosis present

## 2018-02-19 DIAGNOSIS — Z408 Encounter for other prophylactic surgery: Secondary | ICD-10-CM | POA: Diagnosis not present

## 2018-02-19 DIAGNOSIS — K9189 Other postprocedural complications and disorders of digestive system: Secondary | ICD-10-CM | POA: Diagnosis present

## 2018-02-19 DIAGNOSIS — Z823 Family history of stroke: Secondary | ICD-10-CM | POA: Diagnosis not present

## 2018-02-19 DIAGNOSIS — E785 Hyperlipidemia, unspecified: Secondary | ICD-10-CM | POA: Diagnosis present

## 2018-02-19 DIAGNOSIS — Z83518 Family history of other specified eye disorder: Secondary | ICD-10-CM

## 2018-02-19 DIAGNOSIS — Z8719 Personal history of other diseases of the digestive system: Secondary | ICD-10-CM

## 2018-02-19 DIAGNOSIS — Z7984 Long term (current) use of oral hypoglycemic drugs: Secondary | ICD-10-CM

## 2018-02-19 DIAGNOSIS — I10 Essential (primary) hypertension: Secondary | ICD-10-CM | POA: Diagnosis not present

## 2018-02-19 DIAGNOSIS — K913 Postprocedural intestinal obstruction, unspecified as to partial versus complete: Secondary | ICD-10-CM | POA: Diagnosis not present

## 2018-02-19 DIAGNOSIS — Z9049 Acquired absence of other specified parts of digestive tract: Secondary | ICD-10-CM

## 2018-02-19 DIAGNOSIS — Z8249 Family history of ischemic heart disease and other diseases of the circulatory system: Secondary | ICD-10-CM | POA: Diagnosis not present

## 2018-02-19 DIAGNOSIS — K210 Gastro-esophageal reflux disease with esophagitis: Secondary | ICD-10-CM | POA: Diagnosis not present

## 2018-02-19 HISTORY — PX: CYSTOSCOPY W/ URETERAL STENT PLACEMENT: SHX1429

## 2018-02-19 HISTORY — PX: COLON RESECTION SIGMOID: SHX6737

## 2018-02-19 LAB — GLUCOSE, CAPILLARY
GLUCOSE-CAPILLARY: 161 mg/dL — AB (ref 70–99)
Glucose-Capillary: 221 mg/dL — ABNORMAL HIGH (ref 70–99)
Glucose-Capillary: 185 mg/dL — ABNORMAL HIGH (ref 70–99)
Glucose-Capillary: 209 mg/dL — ABNORMAL HIGH (ref 70–99)

## 2018-02-19 LAB — ABO/RH: ABO/RH(D): B NEG

## 2018-02-19 SURGERY — COLECTOMY, SIGMOID, OPEN
Anesthesia: General | Wound class: Clean Contaminated

## 2018-02-19 MED ORDER — SUGAMMADEX SODIUM 500 MG/5ML IV SOLN
INTRAVENOUS | Status: AC
  Filled 2018-02-19: qty 5

## 2018-02-19 MED ORDER — SODIUM CHLORIDE 0.9 % IV SOLN
2.0000 g | Freq: Three times a day (TID) | INTRAVENOUS | Status: AC
  Administered 2018-02-20 (×3): 2 g via INTRAVENOUS
  Filled 2018-02-19 (×3): qty 2

## 2018-02-19 MED ORDER — MONTELUKAST SODIUM 10 MG PO TABS
10.0000 mg | ORAL_TABLET | Freq: Every day | ORAL | Status: DC
  Administered 2018-02-19 – 2018-02-21 (×3): 10 mg via ORAL
  Filled 2018-02-19 (×3): qty 1

## 2018-02-19 MED ORDER — ENOXAPARIN SODIUM 40 MG/0.4ML ~~LOC~~ SOLN
40.0000 mg | SUBCUTANEOUS | Status: DC
  Administered 2018-02-20 – 2018-02-22 (×3): 40 mg via SUBCUTANEOUS
  Filled 2018-02-19 (×3): qty 0.4

## 2018-02-19 MED ORDER — BUPIVACAINE-EPINEPHRINE 0.5% -1:200000 IJ SOLN
INTRAMUSCULAR | Status: DC | PRN
  Administered 2018-02-19: 30 mL

## 2018-02-19 MED ORDER — EPHEDRINE SULFATE 50 MG/ML IJ SOLN
INTRAMUSCULAR | Status: DC | PRN
  Administered 2018-02-19: 5 mg via INTRAVENOUS
  Administered 2018-02-19 (×2): 10 mg via INTRAVENOUS

## 2018-02-19 MED ORDER — ROCURONIUM BROMIDE 50 MG/5ML IV SOLN
INTRAVENOUS | Status: AC
  Filled 2018-02-19: qty 1

## 2018-02-19 MED ORDER — ONDANSETRON HCL 4 MG/2ML IJ SOLN
INTRAMUSCULAR | Status: AC
  Filled 2018-02-19: qty 2

## 2018-02-19 MED ORDER — PANTOPRAZOLE SODIUM 40 MG IV SOLR
40.0000 mg | Freq: Every day | INTRAVENOUS | Status: DC
  Administered 2018-02-19 – 2018-02-21 (×3): 40 mg via INTRAVENOUS
  Filled 2018-02-19 (×3): qty 40

## 2018-02-19 MED ORDER — FENTANYL CITRATE (PF) 100 MCG/2ML IJ SOLN
INTRAMUSCULAR | Status: AC
  Administered 2018-02-19: 25 ug via INTRAVENOUS
  Filled 2018-02-19: qty 2

## 2018-02-19 MED ORDER — SODIUM CHLORIDE 0.9 % IV SOLN
INTRAVENOUS | Status: DC | PRN
  Administered 2018-02-19: 25 ug/min via INTRAVENOUS

## 2018-02-19 MED ORDER — GLYCOPYRROLATE 0.2 MG/ML IJ SOLN
INTRAMUSCULAR | Status: DC | PRN
  Administered 2018-02-19: 0.2 mg via INTRAVENOUS

## 2018-02-19 MED ORDER — PHENYLEPHRINE HCL 10 MG/ML IJ SOLN
INTRAMUSCULAR | Status: AC
  Filled 2018-02-19: qty 1

## 2018-02-19 MED ORDER — SUGAMMADEX SODIUM 500 MG/5ML IV SOLN
INTRAVENOUS | Status: DC | PRN
  Administered 2018-02-19: 280 mg via INTRAVENOUS

## 2018-02-19 MED ORDER — ALVIMOPAN 12 MG PO CAPS
12.0000 mg | ORAL_CAPSULE | ORAL | Status: AC
  Administered 2018-02-19: 12 mg via ORAL

## 2018-02-19 MED ORDER — MEPERIDINE HCL 50 MG/ML IJ SOLN: 6.2500 mg | INTRAMUSCULAR | Status: DC | PRN

## 2018-02-19 MED ORDER — ROCURONIUM BROMIDE 100 MG/10ML IV SOLN
INTRAVENOUS | Status: DC | PRN
  Administered 2018-02-19: 40 mg via INTRAVENOUS
  Administered 2018-02-19: 20 mg via INTRAVENOUS
  Administered 2018-02-19: 10 mg via INTRAVENOUS
  Administered 2018-02-19: 30 mg via INTRAVENOUS
  Administered 2018-02-19: 20 mg via INTRAVENOUS

## 2018-02-19 MED ORDER — ESCITALOPRAM OXALATE 10 MG PO TABS
5.0000 mg | ORAL_TABLET | Freq: Every evening | ORAL | Status: DC
  Administered 2018-02-19 – 2018-02-21 (×3): 5 mg via ORAL
  Filled 2018-02-19 (×4): qty 0.5

## 2018-02-19 MED ORDER — ONDANSETRON HCL 4 MG/2ML IJ SOLN
INTRAMUSCULAR | Status: AC
  Administered 2018-02-19: 4 mg via INTRAVENOUS
  Filled 2018-02-19: qty 2

## 2018-02-19 MED ORDER — LIDOCAINE HCL (CARDIAC) PF 100 MG/5ML IV SOSY
PREFILLED_SYRINGE | INTRAVENOUS | Status: DC | PRN
  Administered 2018-02-19: 60 mg via INTRAVENOUS

## 2018-02-19 MED ORDER — EPHEDRINE SULFATE 50 MG/ML IJ SOLN
INTRAMUSCULAR | Status: AC
  Filled 2018-02-19: qty 1

## 2018-02-19 MED ORDER — SODIUM CHLORIDE 0.9 % IV SOLN
INTRAVENOUS | Status: DC
  Administered 2018-02-19: 07:00:00 via INTRAVENOUS

## 2018-02-19 MED ORDER — BUPIVACAINE HCL (PF) 0.5 % IJ SOLN
INTRAMUSCULAR | Status: AC
  Filled 2018-02-19: qty 30

## 2018-02-19 MED ORDER — LACTATED RINGERS IV SOLN
125.0000 mL/h | INTRAVENOUS | Status: DC
  Administered 2018-02-19 (×2): 125 mL/h via INTRAVENOUS

## 2018-02-19 MED ORDER — OXYCODONE HCL 5 MG/5ML PO SOLN: 5.0000 mg | Freq: Once | ORAL | Status: DC | PRN

## 2018-02-19 MED ORDER — INSULIN ASPART 100 UNIT/ML ~~LOC~~ SOLN
0.0000 [IU] | SUBCUTANEOUS | Status: DC
  Administered 2018-02-19: 5 [IU] via SUBCUTANEOUS
  Administered 2018-02-20 (×2): 3 [IU] via SUBCUTANEOUS
  Administered 2018-02-20: 2 [IU] via SUBCUTANEOUS
  Administered 2018-02-20 – 2018-02-22 (×4): 3 [IU] via SUBCUTANEOUS
  Filled 2018-02-19 (×9): qty 1

## 2018-02-19 MED ORDER — BUPIVACAINE LIPOSOME 1.3 % IJ SUSP
INTRAMUSCULAR | Status: DC | PRN
  Administered 2018-02-19: 20 mL

## 2018-02-19 MED ORDER — SODIUM CHLORIDE (PF) 0.9 % IJ SOLN
INTRAMUSCULAR | Status: DC | PRN
  Administered 2018-02-19: 10 mL

## 2018-02-19 MED ORDER — ALVIMOPAN 12 MG PO CAPS
12.0000 mg | ORAL_CAPSULE | Freq: Two times a day (BID) | ORAL | Status: DC
  Administered 2018-02-20 – 2018-02-22 (×5): 12 mg via ORAL
  Filled 2018-02-19 (×6): qty 1

## 2018-02-19 MED ORDER — ACETAMINOPHEN 500 MG PO TABS
1000.0000 mg | ORAL_TABLET | Freq: Four times a day (QID) | ORAL | Status: DC
  Administered 2018-02-19 – 2018-02-22 (×10): 1000 mg via ORAL
  Filled 2018-02-19 (×11): qty 2

## 2018-02-19 MED ORDER — ONDANSETRON HCL 4 MG/2ML IJ SOLN
INTRAMUSCULAR | Status: DC | PRN
  Administered 2018-02-19 (×2): 4 mg via INTRAVENOUS

## 2018-02-19 MED ORDER — ONDANSETRON 4 MG PO TBDP: 4.0000 mg | ORAL_TABLET | Freq: Four times a day (QID) | ORAL | Status: DC | PRN

## 2018-02-19 MED ORDER — DEXAMETHASONE SODIUM PHOSPHATE 10 MG/ML IJ SOLN
INTRAMUSCULAR | Status: DC | PRN
  Administered 2018-02-19: 10 mg via INTRAVENOUS

## 2018-02-19 MED ORDER — MIDAZOLAM HCL 2 MG/2ML IJ SOLN
INTRAMUSCULAR | Status: AC
  Filled 2018-02-19: qty 2

## 2018-02-19 MED ORDER — CHLORHEXIDINE GLUCONATE CLOTH 2 % EX PADS: 6.0000 | MEDICATED_PAD | Freq: Once | CUTANEOUS | Status: DC

## 2018-02-19 MED ORDER — GABAPENTIN 300 MG PO CAPS
300.0000 mg | ORAL_CAPSULE | ORAL | Status: AC
  Administered 2018-02-19: 300 mg via ORAL

## 2018-02-19 MED ORDER — HYDROMORPHONE HCL 1 MG/ML IJ SOLN
INTRAMUSCULAR | Status: DC | PRN
  Administered 2018-02-19 (×2): 0.5 mg via INTRAVENOUS

## 2018-02-19 MED ORDER — MIDAZOLAM HCL 2 MG/2ML IJ SOLN
INTRAMUSCULAR | Status: DC | PRN
  Administered 2018-02-19: 2 mg via INTRAVENOUS

## 2018-02-19 MED ORDER — FENTANYL CITRATE (PF) 100 MCG/2ML IJ SOLN
25.0000 ug | INTRAMUSCULAR | Status: DC | PRN
  Administered 2018-02-19 (×4): 25 ug via INTRAVENOUS

## 2018-02-19 MED ORDER — HYDROMORPHONE HCL 1 MG/ML IJ SOLN
INTRAMUSCULAR | Status: AC
  Filled 2018-02-19: qty 1

## 2018-02-19 MED ORDER — HYDROCHLOROTHIAZIDE 25 MG PO TABS
12.5000 mg | ORAL_TABLET | Freq: Every day | ORAL | Status: DC
  Administered 2018-02-21 – 2018-02-22 (×2): 12.5 mg via ORAL
  Filled 2018-02-19 (×3): qty 1

## 2018-02-19 MED ORDER — HYDROMORPHONE HCL 1 MG/ML IJ SOLN
0.5000 mg | INTRAMUSCULAR | Status: DC | PRN
  Administered 2018-02-20: 0.5 mg via INTRAVENOUS
  Filled 2018-02-19: qty 0.5

## 2018-02-19 MED ORDER — BUPIVACAINE LIPOSOME 1.3 % IJ SUSP: 20.0000 mL | Freq: Once | INTRAMUSCULAR | Status: DC

## 2018-02-19 MED ORDER — LIDOCAINE HCL (PF) 2 % IJ SOLN
INTRAMUSCULAR | Status: AC
  Filled 2018-02-19: qty 10

## 2018-02-19 MED ORDER — PROMETHAZINE HCL 25 MG/ML IJ SOLN: 6.2500 mg | INTRAMUSCULAR | Status: DC | PRN

## 2018-02-19 MED ORDER — PROPOFOL 10 MG/ML IV BOLUS
INTRAVENOUS | Status: DC | PRN
  Administered 2018-02-19: 110 mg via INTRAVENOUS

## 2018-02-19 MED ORDER — EPINEPHRINE PF 1 MG/ML IJ SOLN
INTRAMUSCULAR | Status: AC
  Filled 2018-02-19: qty 1

## 2018-02-19 MED ORDER — FENTANYL CITRATE (PF) 100 MCG/2ML IJ SOLN
INTRAMUSCULAR | Status: DC | PRN
  Administered 2018-02-19 (×3): 50 ug via INTRAVENOUS

## 2018-02-19 MED ORDER — SODIUM CHLORIDE 0.9 % IV SOLN
INTRAVENOUS | Status: DC | PRN
  Administered 2018-02-19: 08:00:00 via INTRAVENOUS

## 2018-02-19 MED ORDER — KETOROLAC TROMETHAMINE 15 MG/ML IJ SOLN
15.0000 mg | Freq: Four times a day (QID) | INTRAMUSCULAR | Status: DC
  Administered 2018-02-19 – 2018-02-22 (×12): 15 mg via INTRAVENOUS
  Filled 2018-02-19 (×12): qty 1

## 2018-02-19 MED ORDER — LACTATED RINGERS IV SOLN: INTRAVENOUS | Status: DC | PRN

## 2018-02-19 MED ORDER — CHLORHEXIDINE GLUCONATE CLOTH 2 % EX PADS
6.0000 | MEDICATED_PAD | Freq: Once | CUTANEOUS | Status: AC
  Administered 2018-02-19: 6 via TOPICAL

## 2018-02-19 MED ORDER — BUPIVACAINE LIPOSOME 1.3 % IJ SUSP
INTRAMUSCULAR | Status: AC
  Filled 2018-02-19: qty 20

## 2018-02-19 MED ORDER — OXYCODONE HCL 5 MG PO TABS: 5.0000 mg | ORAL_TABLET | Freq: Once | ORAL | Status: DC | PRN

## 2018-02-19 MED ORDER — PROPOFOL 10 MG/ML IV BOLUS
INTRAVENOUS | Status: AC
  Filled 2018-02-19: qty 40

## 2018-02-19 MED ORDER — ACETAMINOPHEN 500 MG PO TABS
1000.0000 mg | ORAL_TABLET | ORAL | Status: AC
  Administered 2018-02-19: 1000 mg via ORAL

## 2018-02-19 MED ORDER — SODIUM CHLORIDE 0.9 % IV SOLN
2.0000 g | INTRAVENOUS | Status: AC
  Administered 2018-02-19: 2 g via INTRAVENOUS
  Filled 2018-02-19: qty 2

## 2018-02-19 MED ORDER — SODIUM CHLORIDE (PF) 0.9 % IJ SOLN
INTRAMUSCULAR | Status: AC
  Filled 2018-02-19: qty 10

## 2018-02-19 MED ORDER — ACETAMINOPHEN 500 MG PO TABS
ORAL_TABLET | ORAL | Status: AC
  Administered 2018-02-19: 1000 mg via ORAL
  Filled 2018-02-19: qty 2

## 2018-02-19 MED ORDER — METHOTREXATE 2.5 MG PO TABS: 7.5000 mg | ORAL_TABLET | ORAL | Status: DC

## 2018-02-19 MED ORDER — GLYCOPYRROLATE 0.2 MG/ML IJ SOLN
INTRAMUSCULAR | Status: AC
  Filled 2018-02-19: qty 1

## 2018-02-19 MED ORDER — ONDANSETRON HCL 4 MG/2ML IJ SOLN: 4.0000 mg | Freq: Four times a day (QID) | INTRAMUSCULAR | Status: DC | PRN

## 2018-02-19 MED ORDER — ALVIMOPAN 12 MG PO CAPS
ORAL_CAPSULE | ORAL | Status: AC
  Administered 2018-02-19: 12 mg via ORAL
  Filled 2018-02-19: qty 1

## 2018-02-19 MED ORDER — LISINOPRIL 20 MG PO TABS
40.0000 mg | ORAL_TABLET | Freq: Every day | ORAL | Status: DC
  Administered 2018-02-21: 40 mg via ORAL
  Filled 2018-02-19: qty 2

## 2018-02-19 MED ORDER — GABAPENTIN 300 MG PO CAPS
ORAL_CAPSULE | ORAL | Status: AC
  Administered 2018-02-19: 300 mg via ORAL
  Filled 2018-02-19: qty 1

## 2018-02-19 MED ORDER — ONDANSETRON HCL 4 MG/2ML IJ SOLN
4.0000 mg | Freq: Once | INTRAMUSCULAR | Status: AC
  Administered 2018-02-19: 4 mg via INTRAVENOUS

## 2018-02-19 MED ORDER — LACTATED RINGERS IV SOLN
INTRAVENOUS | Status: DC | PRN
  Administered 2018-02-19: 12:00:00 via INTRAVENOUS

## 2018-02-19 MED ORDER — FENTANYL CITRATE (PF) 250 MCG/5ML IJ SOLN
INTRAMUSCULAR | Status: AC
  Filled 2018-02-19: qty 5

## 2018-02-19 SURGICAL SUPPLY — 105 items
ADAPTER GOLDBERG URETERAL (ADAPTER) ×3 IMPLANT
BAG DRAIN CYSTO-URO LG1000N (MISCELLANEOUS) ×3 IMPLANT
BLADE SURG SZ10 CARB STEEL (BLADE) ×3 IMPLANT
BRUSH SCRUB EZ  4% CHG (MISCELLANEOUS)
BRUSH SCRUB EZ 4% CHG (MISCELLANEOUS) IMPLANT
BULB RESERV EVAC DRAIN JP 100C (MISCELLANEOUS) ×3 IMPLANT
CANISTER SUCT 1200ML W/VALVE (MISCELLANEOUS) ×3 IMPLANT
CATH FOLEY 2WAY SIL 16X30 (CATHETERS) ×3 IMPLANT
CATH URETL 5X70 OPEN END (CATHETERS) IMPLANT
CHLORAPREP W/TINT 26ML (MISCELLANEOUS) ×3 IMPLANT
CNTNR SPEC 2.5X3XGRAD LEK (MISCELLANEOUS) ×2
CONRAY 43 FOR UROLOGY 50M (MISCELLANEOUS) ×3 IMPLANT
CONT SPEC 4OZ STER OR WHT (MISCELLANEOUS) ×1
CONTAINER SPEC 2.5X3XGRAD LEK (MISCELLANEOUS) ×2 IMPLANT
COVER WAND RF STERILE (DRAPES) IMPLANT
DRAIN CHANNEL JP 19F (MISCELLANEOUS) IMPLANT
DRAPE LEGGINS SURG 28X43 STRL (DRAPES) ×3 IMPLANT
DRAPE UNDER BUTTOCK W/FLU (DRAPES) ×6 IMPLANT
DRSG OPSITE POSTOP 3X4 (GAUZE/BANDAGES/DRESSINGS) ×6 IMPLANT
DRSG OPSITE POSTOP 4X8 (GAUZE/BANDAGES/DRESSINGS) ×3 IMPLANT
DRSG TEGADERM 4X4.75 (GAUZE/BANDAGES/DRESSINGS) ×6 IMPLANT
ELECT BLADE 6.5 EXT (BLADE) ×3 IMPLANT
ELECT CAUTERY BLADE 6.4 (BLADE) IMPLANT
ELECT REM PT RETURN 9FT ADLT (ELECTROSURGICAL) ×3
ELECTRODE REM PT RTRN 9FT ADLT (ELECTROSURGICAL) ×2 IMPLANT
GLOVE BIOGEL PI IND STRL 7.5 (GLOVE) ×2 IMPLANT
GLOVE BIOGEL PI INDICATOR 7.5 (GLOVE) ×1
GLOVE SURG SYN 7.0 (GLOVE) ×18 IMPLANT
GLOVE SURG SYN 7.5  E (GLOVE) ×6
GLOVE SURG SYN 7.5 E (GLOVE) ×12 IMPLANT
GOWN STRL REUS W/ TWL LRG LVL3 (GOWN DISPOSABLE) ×20 IMPLANT
GOWN STRL REUS W/ TWL XL LVL3 (GOWN DISPOSABLE) ×2 IMPLANT
GOWN STRL REUS W/TWL LRG LVL3 (GOWN DISPOSABLE) ×10
GOWN STRL REUS W/TWL XL LVL3 (GOWN DISPOSABLE) ×1
GRASPER SUT TROCAR 14GX15 (MISCELLANEOUS) ×3 IMPLANT
GUIDEWIRE STR DUAL SENSOR (WIRE) ×6 IMPLANT
HANDLE YANKAUER SUCT BULB TIP (MISCELLANEOUS) ×3 IMPLANT
HOLDER FOLEY CATH W/STRAP (MISCELLANEOUS) ×3 IMPLANT
IRRIGATION STRYKERFLOW (MISCELLANEOUS) ×2 IMPLANT
IRRIGATOR STRYKERFLOW (MISCELLANEOUS) ×3
IV NS 1000ML (IV SOLUTION) ×1
IV NS 1000ML BAXH (IV SOLUTION) ×2 IMPLANT
KIT PINK PAD W/HEAD ARE REST (MISCELLANEOUS) ×3
KIT PINK PAD W/HEAD ARM REST (MISCELLANEOUS) ×2 IMPLANT
KIT TURNOVER CYSTO (KITS) ×3 IMPLANT
KIT TURNOVER KIT A (KITS) ×3 IMPLANT
KIT URETTERAL LIGHTED STENTS (STENTS) ×3 IMPLANT
LABEL OR SOLS (LABEL) ×3 IMPLANT
LIGASURE LAP MARYLAND 5MM 37CM (ELECTROSURGICAL) ×3 IMPLANT
NDL HPO THNWL 1X22GA REG BVL (NEEDLE) ×2 IMPLANT
NEEDLE HYPO 22GX1.5 SAFETY (NEEDLE) ×3 IMPLANT
NEEDLE SAFETY 22GX1 (NEEDLE) ×1
NS IRRIG 1000ML POUR BTL (IV SOLUTION) ×3 IMPLANT
PACK COLON CLEAN CLOSURE (MISCELLANEOUS) ×3 IMPLANT
PACK CYSTO AR (MISCELLANEOUS) ×3 IMPLANT
PACK LAP CHOLECYSTECTOMY (MISCELLANEOUS) ×3 IMPLANT
PENCIL ELECTRO HAND CTR (MISCELLANEOUS) ×3 IMPLANT
RELOAD PROXIMATE 75MM BLUE (ENDOMECHANICALS) ×3 IMPLANT
RELOAD STAPLER BLUE 60MM (STAPLE) IMPLANT
RETRACTOR WND ALEXIS-O 25 LRG (MISCELLANEOUS) IMPLANT
RETRACTOR WOUND ALXS 18CM MED (MISCELLANEOUS) ×2 IMPLANT
RTRCTR WOUND ALEXIS O 18CM MED (MISCELLANEOUS) ×3
RTRCTR WOUND ALEXIS O 25CM LRG (MISCELLANEOUS)
SET CYSTO W/LG BORE CLAMP LF (SET/KITS/TRAYS/PACK) ×3 IMPLANT
SIDE-ARM FITTING (MISCELLANEOUS) ×6 IMPLANT
SLEEVE ADV FIXATION 5X100MM (TROCAR) ×3 IMPLANT
SOL .9 NS 3000ML IRR  AL (IV SOLUTION) ×1
SOL .9 NS 3000ML IRR UROMATIC (IV SOLUTION) ×2 IMPLANT
SOL PREP PVP 2OZ (MISCELLANEOUS) ×3
SOLUTION PREP PVP 2OZ (MISCELLANEOUS) ×2 IMPLANT
SPONGE DRAIN TRACH 4X4 STRL 2S (GAUZE/BANDAGES/DRESSINGS) ×3 IMPLANT
SPONGE LAP 18X18 RF (DISPOSABLE) ×6 IMPLANT
STAPLE ECHEON FLEX 60 POW ENDO (STAPLE) IMPLANT
STAPLER CUT CVD 40MM BLUE (STAPLE) ×3 IMPLANT
STAPLER ENDO ILS CVD 18 33 (STAPLE) ×3 IMPLANT
STAPLER PROX 25M (MISCELLANEOUS) IMPLANT
STAPLER PROXIMATE 75MM BLUE (STAPLE) ×3 IMPLANT
STAPLER RELOAD BLUE 60MM (STAPLE)
STAPLER SKIN PROX 35W (STAPLE) ×3 IMPLANT
STAPLER TA28 THK CONTR EEA XL (STAPLE) IMPLANT
STENT URET 6FRX24 CONTOUR (STENTS) IMPLANT
STENT URET 6FRX26 CONTOUR (STENTS) IMPLANT
STRIP CLOSURE SKIN 1/2X4 (GAUZE/BANDAGES/DRESSINGS) ×3 IMPLANT
SURGILUBE 2OZ TUBE FLIPTOP (MISCELLANEOUS) ×9 IMPLANT
SUT ETHILON 3-0 (SUTURE) ×3 IMPLANT
SUT PDS AB 1 CT1 27 (SUTURE) ×6 IMPLANT
SUT PROLENE 2 0 SH DA (SUTURE) ×3 IMPLANT
SUT PROLENE 2-0 (SUTURE) ×1
SUT PROLENE 2-0 TS 14X2 ARM (SUTURE) ×2
SUT SILK 3-0 (SUTURE) ×3 IMPLANT
SUT VIC AB 3-0 SH 27 (SUTURE) ×1
SUT VIC AB 3-0 SH 27X BRD (SUTURE) ×2 IMPLANT
SUT VICRYL 0 AB UR-6 (SUTURE) ×3 IMPLANT
SUTURE PROLEN 2-0 TS 14X2 ARM (SUTURE) ×2 IMPLANT
SYR 10ML LL (SYRINGE) ×3 IMPLANT
SYR 30ML LL (SYRINGE) ×3 IMPLANT
SYRINGE IRR TOOMEY STRL 70CC (SYRINGE) IMPLANT
TOWEL OR 17X26 4PK STRL BLUE (TOWEL DISPOSABLE) ×3 IMPLANT
TRAY FOLEY MTR SLVR 16FR STAT (SET/KITS/TRAYS/PACK) IMPLANT
TROCAR 130MM GELPORT  DAV (MISCELLANEOUS) IMPLANT
TROCAR ADV FIXATION 12X100MM (TROCAR) ×3 IMPLANT
TROCAR ADV FIXATION 5X100MM (TROCAR) ×3 IMPLANT
TROCAR XCEL 12X100 BLDLESS (ENDOMECHANICALS) ×3 IMPLANT
TUBING EVAC SMOKE HEATED PNEUM (TUBING) ×3 IMPLANT
WATER STERILE IRR 1000ML POUR (IV SOLUTION) ×3 IMPLANT

## 2018-02-19 NOTE — Interval H&P Note (Signed)
History and Physical Interval Note:  02/19/2018 7:27 AM  Emily Ryan  has presented today for surgery, with the diagnosis of ANASTOMOTIC STRICTURE-LEFT COLON  The various methods of treatment have been discussed with the patient and family. After consideration of risks, benefits and other options for treatment, the patient has consented to  Procedure(s): LAPAROSCOPIC SIGMOID COLECTOMY- POSSIBLE OPEN (N/A) CYSTOSCOPY WITH STENT PLACEMENT GOING 1ST (Bilateral) as a surgical intervention .  The patient's history has been reviewed, patient examined, no change in status, stable for surgery.  I have reviewed the patient's chart and labs.  Questions were answered to the patient's satisfaction.     [name]

## 2018-02-19 NOTE — Op Note (Signed)
Procedure Date:  02/19/2018  Pre-operative Diagnosis:  Colorectal anastomotic stricture  Post-operative Diagnosis:  Colorectal anastomotic stricture  Procedure:  Laparoscopic Converted to Open Partial Sigmoidectomy  Surgeon:  Melvyn Neth, MD  Assistant:  Talitha Givens, PA-S, Nestor Lewandowsky, MD.  His assistance was needed based on the complexity of the procedure, for appropriate retraction, transrectal anastomosis, dissection, and possibility of needing to convert to open procedure.  Anesthesia:  General endotracheal  Estimated Blood Loss:  100 ml  Specimens: 1. Colorectal strictured anastomosis 2. Anastomotic donuts  Complications:  None  Indications for Procedure:  This is a 73 y.o. female with a prior open sigmoidectomy for diverticulitis, with a colorectal anastomotic stricture that required to be dilated by Dr. Marius Ditch in the past.  She now presents for resection of anstomosis.  Barium enema did not reveal any other issues that would preclude surgery. The risks of bleeding, infection, bowel injury, need for diverting ostomy, and need for further procedures were all discussed with the patient and she was willing to proceed.   Description of Procedure: The patient was correctly identified in the preoperative area and brought into the operating room.  The patient was placed supine with VTE prophylaxis in place.  Appropriate time-outs were performed.  Anesthesia was induced and the patient was intubated.  Appropriate antibiotics were infused.  The patient was then placed in lithotomy position.   Dr. Diamantina Providence then proceeded with a cystoscopy and placement of bilateral lighted ureteral stents.  Please see Dr. Doristine Counter op note for further details.  The abdomen was prepped and draped in a sterile fashion. An infraumbilical incision was made. A cutdown technique was used to enter the abdominal cavity without injury, and a Hasson trocar was inserted.  Pneumoperitoneum was obtained with  appropriate opening pressures.  Bilateral upper quadrant 5-mm ports, and a right lower quadrant 12 mm port were placed under direct visualization.  The abdominal cavity was inspected and found to have adhesions of omentum towards the umbilicus and adhesions from sigmoid/rectum to the pelvic wall.  The umbilical adhesions were taken down using LigaSure.  Attention was then moved to the left lower quadrant where the sigmoid colon was identified.  It was noted to be healthy and colorectal anastomosis was end to side. The colon proximal to the anastomosis was easily mobilized using LigaSure.  The distal portion of the anastomosis was densely adhered to the pelvic wall.  Both ureters were identified with the aid of the lighted stents, and were kept intact during mobilization, but we reached a point where it was difficult to distinguish plane between rectum and pelvic wall, and could not easily create a window in the mesorectum for the stapler.  At that point, it was decided to convert to open.  All ports were removed and pneumoperitoneum was released and the infraumbilical incision was extended inferiorly.  The incision was protected with a wound protector.  A window in the mesentery at our proximal margin was created and a linear blue load stapler was fired across.  This allowed [place] better handling of the distal portion and the colon was retracted superiorly.  The mesentery was dissected using LigaSure distally, allowing the proximal rectum to be mobilized off the pelvic sidewalls and a window in the mesentery was created at our distal margin of resection.  A contour blue load stapler was brought across the rectum and fired, removing the specimen off the field.  Back to the proximal colon, the staple line was resected.  The colon  sizers and dilators were used and a 33 size EEA stapler was opened.  A purse string suture was created with 2-0 Prolene.  The anvil was placed inside the colon lumen and the purse string was  tied around it.  The edges of the colon were cleaned and the stapler was passed transrectally to the rectal stump.  The spike was deployed and the anvil was connected to it, making sure the colon was not twisted.  The stapler was closed and fired without complications and then retracted.  Rigid sigmoidoscope was used for air insufflation test under saline and there were very small bubbles noted anteriorly.  A small defect at the anterior staple line was found and sutured with 3-0 silk x 2.  Repeat insufflation test showed no further leak.  The abdomen and pelvis were irrigated thoroughly.  A 19 Fr Blake drain was placed via the left port going into the pelvis.  The right lower quadrant port fascia was closed with 0 Vicryl tie and the PMI suture grasper.  We then scrubbed out and re-scrubbed for the clean closure component.    60 ml of Exparel solution was infiltrated to the fascia and subcutaneous tissue.  The midline fascia was closed using #1 PDS suture x 2.  The wounds were irrigated and closed with stapler.  The drain was secured with 3-0 Nylon suture.  Wounds were cleaned and dressed with Honeycomb dressings and the drain with 4x4 and Tegaderm.  The right ureteral stent was removed and the left stent and Foley catheter were kept in place.  The patient was emerged from anesthesia and extubated and brought to the recovery room for further management.  The patient tolerated the procedure well and all counts were correct at the end of the case.   Melvyn Neth, MD

## 2018-02-19 NOTE — Anesthesia Postprocedure Evaluation (Signed)
Anesthesia Post Note  Patient: Emily Ryan  Procedure(s) Performed: LAPAROSCOPIC SIGMOID COLECTOMY- POSSIBLE OPEN (N/A ) CYSTOSCOPY WITH STENT PLACEMENT GOING 1ST (Bilateral )  Patient location during evaluation: PACU Anesthesia Type: General Level of consciousness: awake and alert and oriented Pain management: pain level controlled Vital Signs Assessment: post-procedure vital signs reviewed and stable Respiratory status: spontaneous breathing, nonlabored ventilation and respiratory function stable Cardiovascular status: blood pressure returned to baseline and stable Postop Assessment: no signs of nausea or vomiting Anesthetic complications: no     Last Vitals:  Vitals:   02/19/18 1450 02/19/18 1455  BP: 125/69   Pulse: 99 99  Resp: 14 14  Temp:    SpO2: 92% 90%    Last Pain:  Vitals:   02/19/18 1450  TempSrc:   PainSc: 0-No pain                 Roslind Michaux

## 2018-02-19 NOTE — Progress Notes (Signed)
UROLOGY H&P UPDATE  Agree with prior H&P dated 02/19/2018. Patient of Dr. Hampton Abbot with colonic anastomotic stricture, plan for laparoscopic possible open sigmoid colectomy. Requests bilateral access catheter placement to aid in ureteral identification.  Laterality: Bilateral Procedure: Cystoscopy, bilateral retrograde pyelogram, ureteral access catheter placement  Informed consent obtained, we specifically discussed the risks of bleeding, infection, post-operative pain, need for additional procedures, AKI, hematuria, flank pain, and ureteral injury.  Billey Co, MD 02/19/2018

## 2018-02-19 NOTE — Op Note (Signed)
Date of procedure: 02/19/18  Preoperative diagnosis:  1. Anastomotic stricture left colon, request for lighted ureteral catheters  Postoperative diagnosis:  1. Same  Procedure: 1. Cystoscopy, bilateral lighted ureteral catheter placement  Surgeon: Nickolas Madrid, MD  Anesthesia: General  Complications: None  Intraoperative findings:  1.  Normal cystoscopy, no abnormal bladder findings, ureteral orifices orthotopic bilaterally 2.  Uncomplicated placement of bilateral lighted ureteral catheters  EBL: Minimal  Specimens: None  Drains: Bilateral ureteral lighted catheters, 16 French Foley  Indication: Emily Ryan is a 73 y.o. patient with anastomotic stricture of the left colon and history of diverticulitis.  She is undergoing laparoscopic/possible open sigmoid colectomy with Dr. Hampton Abbot and he requested bilateral lighted ureteral catheters to aid in intraoperative identification.  After reviewing the management options for treatment, they elected to proceed with the above surgical procedure(s). We have discussed the potential benefits and risks of the procedure, side effects of the proposed treatment, the likelihood of the patient achieving the goals of the procedure, and any potential problems that might occur during the procedure or recuperation. Informed consent has been obtained.  Description of procedure:  The patient was taken to the operating room and general anesthesia was induced.  SCDs were placed for DVT prophylaxis.  The patient was placed in the dorsal lithotomy position, prepped and draped in the usual sterile fashion, and preoperative antibiotics  were administered. A preoperative time-out was performed.   The 21 French rigid cystoscope was used to intubate the urethra.  Thorough cystoscopy was performed and the bladder was grossly normal with no abnormal findings.  The ureteral orifices were orthotopic bilaterally.  I started by advancing a sensor wire into the  right ureteral orifice and under fluoroscopic vision this was followed to the kidney.  The scope was removed, and the 6 [place] access catheter was advanced over the wire under fluoroscopic vision, and the wire removed.  The scope was replaced and the identical procedure was performed on the patient's left side.  A 16 French Foley was placed.  The ureteral access catheters were secured to the Foley catheter using Steri-Strips.  The lighted filaments were advanced into the ureteral access sheath and secured in position.  Both access catheters and Foley were connected to dependent drainage.  This concluded the urology portion of the procedure.  Continue with general surgery for sigmoid colectomy.  Ureteral catheters to be removed at the conclusion of the general surgery portion of the case.  Disposition: Stable to PACU  Plan: Continue with general surgery, remove catheters at end of case  Nickolas Madrid, MD

## 2018-02-19 NOTE — Transfer of Care (Signed)
Immediate Anesthesia Transfer of Care Note  Patient: Emily Ryan  Procedure(s) Performed: LAPAROSCOPIC SIGMOID COLECTOMY- POSSIBLE OPEN (N/A ) CYSTOSCOPY WITH STENT PLACEMENT GOING 1ST (Bilateral )  Patient Location: PACU  Anesthesia Type:General  Level of Consciousness: drowsy and responds to stimulation  Airway & Oxygen Therapy: Patient Spontanous Breathing and Patient connected to face mask oxygen  Post-op Assessment: Report given to RN and Post -op Vital signs reviewed and stable  Post vital signs: Reviewed and stable  Last Vitals:  Vitals Value Taken Time  BP 130/67 02/19/2018  2:21 PM  Temp    Pulse 99 02/19/2018  2:28 PM  Resp 13 02/19/2018  2:28 PM  SpO2 98 % 02/19/2018  2:28 PM  Vitals shown include unvalidated device data.  Last Pain:  Vitals:   02/19/18 0622  TempSrc: Tympanic  PainSc: 0-No pain         Complications: No apparent anesthesia complications

## 2018-02-19 NOTE — Anesthesia Post-op Follow-up Note (Signed)
Anesthesia QCD[employer] form completed.        

## 2018-02-19 NOTE — Anesthesia Procedure Notes (Signed)
Procedure Name: Intubation Date/Time: 02/19/2018 8:02 AM Performed by: Kelton Pillar, CRNA Pre-anesthesia Checklist: Patient identified, Emergency Drugs available, Suction available and Patient being monitored Patient Re-evaluated:Patient Re-evaluated prior to induction Oxygen Delivery Method: Circle system utilized Preoxygenation: Pre-oxygenation with 100% oxygen Induction Type: IV induction Ventilation: Mask ventilation without difficulty Laryngoscope Size: Mac and 3 Grade View: Grade I Tube type: Oral Tube size: 7.0 mm Number of attempts: 1 Placement Confirmation: ETT inserted through vocal cords under direct vision,  CO2 detector,  breath sounds checked- equal and bilateral and positive ETCO2 Secured at: 22 cm Tube secured with: Tape Dental Injury: Teeth and Oropharynx as per pre-operative assessment

## 2018-02-19 NOTE — Anesthesia Preprocedure Evaluation (Signed)
Anesthesia Evaluation  Patient identified by MRN, date of birth, ID band Patient awake    Reviewed: Allergy & Precautions, NPO status , Patient's Chart, lab work & pertinent test results  History of Anesthesia Complications Negative for: history of anesthetic complications  Airway Mallampati: II  TM Distance: >3 FB Neck ROM: Full    Dental no notable dental hx.    Pulmonary neg pulmonary ROS, neg sleep apnea, neg COPD,    breath sounds clear to auscultation- rhonchi (-) wheezing      Cardiovascular hypertension, Pt. on medications (-) CAD, (-) Past MI, (-) Cardiac Stents and (-) CABG  Rhythm:Regular Rate:Normal - Systolic murmurs and - Diastolic murmurs    Neuro/Psych neg Seizures negative neurological ROS  negative psych ROS   GI/Hepatic Neg liver ROS, GERD  ,  Endo/Other  diabetes, Oral Hypoglycemic Agents  Renal/GU negative Renal ROS     Musculoskeletal negative musculoskeletal ROS (+)   Abdominal (+) - obese,   Peds  Hematology negative hematology ROS (+)   Anesthesia Other Findings Past Medical History: No date: Complicated pregnancy     Comment:  1st pregnancy complicated by post operative hemorrhage               and 2ng complicated by epidural No date: Diabetes mellitus No date: Diverticulosis of colon (without mention of hemorrhage) No date: GERD (gastroesophageal reflux disease) 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 No date: Hyperlipidemia No date: Hypertension No date: Menopausal disorder No date: Skin cancer   Reproductive/Obstetrics                             Anesthesia Physical Anesthesia Plan  ASA: III  Anesthesia Plan: General   Post-op Pain Management:    Induction: Intravenous  PONV Risk Score and Plan: 2 and Ondansetron, Dexamethasone and Midazolam  Airway Management Planned: Oral ETT  Additional Equipment:   Intra-op Plan:   Post-operative Plan: Extubation  in OR  Informed Consent: I have reviewed the patients History and Physical, chart, labs and discussed the procedure including the risks, benefits and alternatives for the proposed anesthesia with the patient or authorized representative who has indicated his/her understanding and acceptance.     Dental advisory given  Plan Discussed with: CRNA and Anesthesiologist  Anesthesia Plan Comments:         Anesthesia Quick Evaluation

## 2018-02-19 NOTE — Brief Op Note (Signed)
02/19/2018  2:07 PM  PATIENT:  Emily Ryan  73 y.o. female  PRE-OPERATIVE DIAGNOSIS:  ANASTOMOTIC STRICTURE-LEFT COLON  POST-OPERATIVE DIAGNOSIS:  ANASTOMOTIC STRICTURE-LEFT COLON  PROCEDURE:  Procedure(s): LAPAROSCOPIC CONVERTED TO OPEN SIGMOIDECTOMY  CYSTOSCOPY WITH STENT PLACEMENT GOING 1ST (Bilateral)  SURGEON:  Surgeon(s) and Role: Panel 1:    * Kiyonna Tortorelli, MD - Primary    * Nestor Lewandowsky, MD - Assisting    Panel 2:    * Billey Co, MD - Primary  ASSISTANTS: Mecum, Erin, PA-S  ANESTHESIA:   general  EBL:  150 mL   BLOOD ADMINISTERED:none  DRAINS: (19Fr) Blake drain(s) in the pelvis   LOCAL MEDICATIONS USED:  OTHER 60 ml Exparel combined with 0.5% Bupivacaine with epi.  SPECIMEN:  Source of Specimen:  Colorectal strictured anastomosis, anastomotic donuts  DISPOSITION OF SPECIMEN:  PATHOLOGY  COUNTS:  YES  DICTATION: .Dragon Dictation  PLAN OF CARE: Admit to inpatient   PATIENT DISPOSITION:  PACU - hemodynamically stable.   Delay start of Pharmacological VTE agent (>24hrs) due to surgical blood loss or risk of bleeding: yes

## 2018-02-20 ENCOUNTER — Encounter: Payer: Self-pay | Admitting: Surgery

## 2018-02-20 LAB — CBC WITH DIFFERENTIAL/PLATELET
Abs Immature Granulocytes: 0.03 10*3/uL (ref 0.00–0.07)
Basophils Absolute: 0 10*3/uL (ref 0.0–0.1)
Basophils Relative: 0 %
Eosinophils Absolute: 0.2 10*3/uL (ref 0.0–0.5)
Eosinophils Relative: 2 %
HCT: 34.8 % — ABNORMAL LOW (ref 36.0–46.0)
Hemoglobin: 11.7 g/dL — ABNORMAL LOW (ref 12.0–15.0)
Immature Granulocytes: 0 %
Lymphocytes Relative: 10 %
Lymphs Abs: 1 10*3/uL (ref 0.7–4.0)
MCH: 33.5 pg (ref 26.0–34.0)
MCHC: 33.6 g/dL (ref 30.0–36.0)
MCV: 99.7 fL (ref 80.0–100.0)
MONO ABS: 1.4 10*3/uL — AB (ref 0.1–1.0)
Monocytes Relative: 14 %
Neutro Abs: 7.3 10*3/uL (ref 1.7–7.7)
Neutrophils Relative %: 74 %
Platelets: 203 10*3/uL (ref 150–400)
RBC: 3.49 MIL/uL — ABNORMAL LOW (ref 3.87–5.11)
RDW: 13.2 % (ref 11.5–15.5)
Smear Review: NORMAL
WBC: 9.8 10*3/uL (ref 4.0–10.5)
nRBC: 0 % (ref 0.0–0.2)

## 2018-02-20 LAB — GLUCOSE, CAPILLARY
GLUCOSE-CAPILLARY: 133 mg/dL — AB (ref 70–99)
Glucose-Capillary: 101 mg/dL — ABNORMAL HIGH (ref 70–99)
Glucose-Capillary: 116 mg/dL — ABNORMAL HIGH (ref 70–99)
Glucose-Capillary: 128 mg/dL — ABNORMAL HIGH (ref 70–99)
Glucose-Capillary: 152 mg/dL — ABNORMAL HIGH (ref 70–99)
Glucose-Capillary: 161 mg/dL — ABNORMAL HIGH (ref 70–99)
Glucose-Capillary: 83 mg/dL (ref 70–99)

## 2018-02-20 LAB — BASIC METABOLIC PANEL
Anion gap: 3 — ABNORMAL LOW (ref 5–15)
BUN: 11 mg/dL (ref 8–23)
CO2: 26 mmol/L (ref 22–32)
CREATININE: 0.85 mg/dL (ref 0.44–1.00)
Calcium: 7.8 mg/dL — ABNORMAL LOW (ref 8.9–10.3)
Chloride: 108 mmol/L (ref 98–111)
GFR calc Af Amer: >60 mL/min (ref >60)
GFR calc non Af Amer: >60 mL/min (ref >60)
GLUCOSE: 125 mg/dL — AB (ref 70–99)
Potassium: 3.4 mmol/L — ABNORMAL LOW (ref 3.5–5.1)
Sodium: 137 mmol/L (ref 135–145)

## 2018-02-20 LAB — MAGNESIUM: Magnesium: 1.7 mg/dL (ref 1.7–2.4)

## 2018-02-20 MED ORDER — LACTATED RINGERS IV SOLN
INTRAVENOUS | Status: DC
  Administered 2018-02-20 – 2018-02-21 (×2): via INTRAVENOUS

## 2018-02-20 MED ORDER — SODIUM CHLORIDE 0.9 % IV SOLN
INTRAVENOUS | Status: DC | PRN
  Administered 2018-02-20: 250 mL via INTRAVENOUS

## 2018-02-20 MED ORDER — MENTHOL 3 MG MT LOZG
1.0000 | LOZENGE | OROMUCOSAL | Status: DC | PRN
  Filled 2018-02-20: qty 9

## 2018-02-20 MED ORDER — MAGNESIUM SULFATE 2 GM/50ML IV SOLN
2.0000 g | Freq: Once | INTRAVENOUS | Status: AC
  Administered 2018-02-20: 2 g via INTRAVENOUS
  Filled 2018-02-20: qty 50

## 2018-02-20 MED ORDER — POTASSIUM CHLORIDE CRYS ER 20 MEQ PO TBCR
40.0000 meq | EXTENDED_RELEASE_TABLET | Freq: Once | ORAL | Status: AC
  Administered 2018-02-20: 40 meq via ORAL
  Filled 2018-02-20: qty 2

## 2018-02-20 NOTE — Progress Notes (Signed)
02/20/2018  Subjective: Patient is 1 Day Post-Op s/p laparoscopic converted to open partial sigmoidectomy.  No acute events.  Patient reports some gas discomfort and discomfort at the incisions, but no worsening pain.  Denies nausea or vomiting.  No bowel function yet.  Vital signs: Temp:  [97.5 F (36.4 C)-98.8 F (37.1 C)] 97.8 F (36.6 C) (02/18 0345) Pulse Rate:  [76-104] 76 (02/18 0815) Resp:  [8-20] 20 (02/18 0345) BP: (97-151)/(50-77) 120/60 (02/18 0815) SpO2:  [90 %-99 %] 95 % (02/18 0815)   Intake/Output: 02/17 0701 - 02/18 0700 In: 3788.2 [I.V.:3588.2; IV Piggyback:200] Out: 1180 [Urine:850; Drains:180; Blood:150] Last BM Date: 02/17/18  Physical Exam: Constitutional: No acute distress Abdomen:  Soft, mildly distended, appropriately tender to palpation.  Incisions are clean, dry, intact with staples. JP drain serosanguinous. GU:  Foley catheter and left ureteral stent in place, with blood tinged urine.  Labs:  Recent Labs    02/20/18 0516  WBC 9.8  HGB 11.7*  HCT 34.8*  PLT 203   Recent Labs    02/20/18 0516  NA 137  K 3.4*  CL 108  CO2 26  GLUCOSE 125*  BUN 11  CREATININE 0.85  CALCIUM 7.8*   No results for input(s): LABPROT, INR in the last 72 hours.  Imaging: Dg Abd 1 View  Result Date: 02/19/2018 CLINICAL DATA:  Imaging provided for ureteral stent placement. EXAM: ABDOMEN - 1 VIEW; DG C-ARM 61-120 MIN COMPARISON:  None. FINDINGS: Three submitted images show placement of ureteral wires. Please refer to the procedure report for further details. IMPRESSION: Portable fluoroscopic images provided for ureteral stent placement. Electronically Signed   By: Lajean Manes M.D.   On: 02/19/2018 08:49   Dg C-arm 1-60 Min  Result Date: 02/19/2018 CLINICAL DATA:  Imaging provided for ureteral stent placement. EXAM: ABDOMEN - 1 VIEW; DG C-ARM 61-120 MIN COMPARISON:  None. FINDINGS: Three submitted images show placement of ureteral wires. Please refer to the  procedure report for further details. IMPRESSION: Portable fluoroscopic images provided for ureteral stent placement. Electronically Signed   By: Lajean Manes M.D.   On: 02/19/2018 08:49    Assessment/Plan: This is a 73 y.o. female s/p laparoscopic converted to open partial sigmoidectomy  --Left ureteral stent removed at bedside without complications.  Foley catheter will remain in place today and d/c tomorrow. --Will start clear liquid diet today --Decrease IV fluids to 75 ml/hr --OOB, ambulate --Repleted Potassium and Magnesium today --Continue JP drain.   Melvyn Neth, [facility]

## 2018-02-21 LAB — CBC
HEMATOCRIT: 33.8 % — AB (ref 36.0–46.0)
Hemoglobin: 11.3 g/dL — ABNORMAL LOW (ref 12.0–15.0)
MCH: 33.9 pg (ref 26.0–34.0)
MCHC: 33.4 g/dL (ref 30.0–36.0)
MCV: 101.5 fL — ABNORMAL HIGH (ref 80.0–100.0)
Platelets: 166 10*3/uL (ref 150–400)
RBC: 3.33 MIL/uL — ABNORMAL LOW (ref 3.87–5.11)
RDW: 13.4 % (ref 11.5–15.5)
WBC: 8.2 10*3/uL (ref 4.0–10.5)
nRBC: 0 % (ref 0.0–0.2)

## 2018-02-21 LAB — BASIC METABOLIC PANEL
Anion gap: 4 — ABNORMAL LOW (ref 5–15)
BUN: 8 mg/dL (ref 8–23)
CALCIUM: 7.7 mg/dL — AB (ref 8.9–10.3)
CO2: 26 mmol/L (ref 22–32)
Chloride: 109 mmol/L (ref 98–111)
Creatinine, Ser: 0.72 mg/dL (ref 0.44–1.00)
GFR calc Af Amer: >60 mL/min (ref >60)
GFR calc non Af Amer: >60 mL/min (ref >60)
Glucose, Bld: 101 mg/dL — ABNORMAL HIGH (ref 70–99)
Potassium: 3.9 mmol/L (ref 3.5–5.1)
Sodium: 139 mmol/L (ref 135–145)

## 2018-02-21 LAB — SURGICAL PATHOLOGY

## 2018-02-21 LAB — GLUCOSE, CAPILLARY
Glucose-Capillary: 101 mg/dL — ABNORMAL HIGH (ref 70–99)
Glucose-Capillary: 169 mg/dL — ABNORMAL HIGH (ref 70–99)
Glucose-Capillary: 79 mg/dL (ref 70–99)
Glucose-Capillary: 90 mg/dL (ref 70–99)
Glucose-Capillary: 97 mg/dL (ref 70–99)

## 2018-02-21 LAB — MAGNESIUM: Magnesium: 1.9 mg/dL (ref 1.7–2.4)

## 2018-02-21 NOTE — Progress Notes (Signed)
[place] Hospital Day(s): 2.   Post op day(s): 2 Days Post-Op.   Interval History: Patient seen and examined, no acute events or new complaints overnight. Patient reports diffuse abdominal soreness without complaints of fever, chill, nausea, or emesis. She does endorse flatus and two loose bowel movements yesterday. Plans to mobilize again today.   Review of Systems:  Constitutional: denies fever, chills  Gastrointestinal: + abdominal pain, denied N/V, or diarrhea/and bowel function as per interval history Integumentary: denies any other rashes or skin discolorations except surgical incisions   Vital signs in last 24 hours: [min-max] current  Temp:  [97.8 F (36.6 C)-98.6 F (37 C)] 97.9 F (36.6 C) (02/19 0442) Pulse Rate:  [71-77] 71 (02/19 0442) Resp:  [16-18] 18 (02/19 0442) BP: (120-134)/(60-68) 127/66 (02/19 0442) SpO2:  [91 %-95 %] 93 % (02/19 0442)     Height: 4\' 11"  (149.9 cm) Weight: 62.1 kg BMI (Calculated): 27.64   Intake/Output this shift:  Total I/O In: -  Out: 520 [Urine:500; Drains:20]    Physical Exam:  Constitutional: alert, cooperative and no distress  Respiratory: breathing non-labored at rest  Gastrointestinal: soft, non-tender, and non-distended, no rebound or guarding, JP in right mid abdomen with serosanguinous drainage Integumentary: midline laparotomy incision is CDI without erythema/drainage, laparoscopic incisions are CDI without erythema or drainage   Labs:  CBC Latest Ref Rng & Units 02/21/2018 02/20/2018 02/12/2018  WBC 4.0 - 10.5 K/uL 8.2 9.8 5.4  Hemoglobin 12.0 - 15.0 g/dL 11.3(L) 11.7(L) 14.3  Hematocrit 36.0 - 46.0 % 33.8(L) 34.8(L) 41.8  Platelets 150 - 400 K/uL 166 203 272   CMP Latest Ref Rng & Units 02/21/2018 02/20/2018 02/12/2018  Glucose 70 - 99 mg/dL 101(H) 125(H) 127(H)  BUN 8 - 23 mg/dL 8 11 9   Creatinine 0.44 - 1.00 mg/dL 0.72 0.85 0.51  Sodium 135 - 145 mmol/L 139 137 137  Potassium  3.5 - 5.1 mmol/L 3.9 3.4(L) 3.6  Chloride 98 - 111 mmol/L 109 108 100  CO2 22 - 32 mmol/L 26 26 29   Calcium 8.9 - 10.3 mg/dL 7.7(L) 7.8(L) 9.1  Total Protein 6.5 - 8.1 g/dL - - 7.0  Total Bilirubin 0.3 - 1.2 mg/dL - - 0.8  Alkaline Phos 38 - 126 U/L - - 76  AST 15 - 41 U/L - - 34  ALT 0 - 44 U/L - - 45(H)    Imaging studies: No new pertinent imaging studies   Assessment/Plan: 73 y.o. female overall doing well with return of bowel function 2 Days Post-Op s/p open partial sigmoid colectomy for colorectal anastomosis stricture   - Advance to full liquids, wean IVF   - Discontinue foley catheter   - Pain control as needed  - Continue to monitor abdominal examination, on-going bowel function, and JP drain output  - Mobilize  - DVT prophylaxis    All of the above findings and recommendations were discussed with the patient, and the medical team, and all of patient's questions were answered to her expressed satisfaction.  -- Edison Simon, PA-C [facility] 02/21/2018, 8:02 AM [phone] M-F: 7am - 4pm

## 2018-02-22 LAB — GLUCOSE, CAPILLARY
Glucose-Capillary: 192 mg/dL — ABNORMAL HIGH (ref 70–99)
Glucose-Capillary: 99 mg/dL (ref 70–99)
Glucose-Capillary: 107 mg/dL — ABNORMAL HIGH (ref 70–99)
Glucose-Capillary: 175 mg/dL — ABNORMAL HIGH (ref 70–99)

## 2018-02-22 MED ORDER — OXYCODONE HCL 5 MG PO TABS: 5.0000 mg | ORAL_TABLET | ORAL | 0 refills | Status: DC | PRN

## 2018-02-22 MED ORDER — OXYCODONE HCL 5 MG PO TABS
5.0000 mg | ORAL_TABLET | ORAL | Status: DC | PRN
  Administered 2018-02-22 (×2): 10 mg via ORAL
  Filled 2018-02-22 (×2): qty 2

## 2018-02-22 MED ORDER — IBUPROFEN 800 MG PO TABS: 800.0000 mg | ORAL_TABLET | Freq: Three times a day (TID) | ORAL | 0 refills | Status: DC | PRN

## 2018-02-22 MED ORDER — SIMVASTATIN 40 MG PO TABS
40.0000 mg | ORAL_TABLET | Freq: Every day | ORAL | Status: DC
  Filled 2018-02-22: qty 1

## 2018-02-22 MED ORDER — ASPIRIN EC 81 MG PO TBEC
81.0000 mg | DELAYED_RELEASE_TABLET | Freq: Every day | ORAL | Status: DC
  Administered 2018-02-22: 81 mg via ORAL
  Filled 2018-02-22: qty 1

## 2018-02-22 MED ORDER — EZETIMIBE 10 MG PO TABS
10.0000 mg | ORAL_TABLET | Freq: Every day | ORAL | Status: DC
  Filled 2018-02-22: qty 1

## 2018-02-22 NOTE — Discharge Summary (Signed)
[facility] SURGICAL DISCHARGE SUMMARY  Patient ID: Emily Ryan MRN: [id number] DOB/AGE: 1945/05/08 73 y.o.  Admit date: 02/19/2018 Discharge date: 02/22/2018  Discharge Diagnoses Colorectal Anastomosis stricture  Consultants Urology  Procedures 02/19/2018:  Open Partial sigmoidcolectomy  HPI: Emily Ryan is a 73 y.o. female with a previous sigmoid colectomy for recurrent diverticulitis who developed stricture at anastomotic site who presents to [facility] for partial sigmoid colectomy with Dr [facility] Course: Informed consent was obtained and documented, and patient underwent uneventful open partial sigmoid colectomy (Dr Hampton Abbot, 02/19/2018).  Post-operatively, patient's pain improved/resolved and advancement of patient's diet and ambulation were well-tolerated. The remainder of patient's hospital course was essentially unremarkable, and discharge planning was initiated accordingly with patient safely able to be discharged home with appropriate discharge instructions, pain control, and outpatient follow-up after all of her and her family's questions were answered to their expressed satisfaction.  Discharge Condition: Good   Physical Examination:  Constitutional: alert, cooperative and no distress  Respiratory: breathing non-labored at rest  Gastrointestinal: soft, non-tender, and non-distended, no rebound or guarding, JP in right mid abdomen with serosanguinous drainage Integumentary: midline laparotomy incision is CDI without erythema/drainage, laparoscopic incisions are CDI without erythema or drainage   Allergies as of 02/22/2018   No Known Allergies     Medication List    TAKE these medications   aspirin 81 MG chewable tablet Chew 81 mg by mouth daily.   Calcium Carb-Cholecalciferol 600-100 MG-UNIT Caps Take 1 tablet by mouth daily. Two by mouth daily What changed:  additional instructions   Dulaglutide 0.75 MG/0.5ML Sopn Commonly known  as:  TRULICITY Inject 9.02 mg into the skin once a week.   escitalopram 5 MG tablet Commonly known as:  LEXAPRO Take 1 tablet (5 mg total) by mouth daily. What changed:  when to take this   ezetimibe 10 MG tablet Commonly known as:  ZETIA Take 1 tablet (10 mg total) by mouth daily.   Fish Oil 1000 MG Caps Take 1 capsule by mouth daily.   folic acid 1 MG tablet Commonly known as:  FOLVITE Take 1 mg by mouth See admin instructions. Takes daily except on Saturday   glipiZIDE 5 MG tablet Commonly known as:  GLUCOTROL Take 1 tablet (5 mg total) by mouth 2 (two) times daily before a meal.   glucose blood test strip Commonly known as:  FREESTYLE LITE Use as instructed to test blood sugar twice daily   hydrochlorothiazide 12.5 MG tablet Commonly known as:  HYDRODIURIL Take 1 tablet (12.5 mg total) by mouth daily.   ibuprofen 800 MG tablet Commonly known as:  ADVIL,MOTRIN Take 1 tablet (800 mg total) by mouth every 8 (eight) hours as needed.   lisinopril 40 MG tablet Commonly known as:  PRINIVIL,ZESTRIL Take 1 tablet (40 mg total) by mouth daily.   loratadine 10 MG tablet Commonly known as:  CLARITIN Take 1 tablet (10 mg total) by mouth daily.   metFORMIN 750 MG 24 hr tablet Commonly known as:  GLUCOPHAGE XR Take 1 tablet (750 mg total) by mouth daily with breakfast.   methotrexate 2.5 MG tablet Commonly known as:  RHEUMATREX Take 7.5 mg by mouth every Saturday.   montelukast 10 MG tablet Commonly known as:  SINGULAIR Take 1 tablet (10 mg total) by mouth at bedtime.   multivitamin capsule Take 1 capsule by mouth daily.   omeprazole 20 MG capsule Commonly known as:  PRILOSEC Take 1 capsule (20 mg total) by mouth  daily.   oxyCODONE 5 MG immediate release tablet Commonly known as:  Oxy IR/ROXICODONE Take 1-2 tablets (5-10 mg total) by mouth every 4 (four) hours as needed for moderate pain or severe pain.   PRECISION THIN LANCETS Misc Use as directed to check  sugars twice daily   simvastatin 40 MG tablet Commonly known as:  ZOCOR Take 1 tablet (40 mg total) by mouth daily. What changed:  when to take this   vitamin C 1000 MG tablet Take 1,000 mg by mouth daily.   vitamin E 400 UNIT capsule Take 400 Units by mouth daily.        Follow-up Information    Olean Ree, MD. Go on 03/02/2018.   Specialty:  General Surgery Why:  @11 :15am for follow up for s/p open partial sigmoid colectomy, JP in place, will need staples removed Contact information: [address] [place] [address] [phone]            -- Deion Forgue , PA-C [facility]  02/22/2018, 2:01 PM [phone] M-F: 7am - 4pm

## 2018-02-22 NOTE — Progress Notes (Signed)
Emily Ryan to be D/C'd home per MD order.  Discussed prescriptions and follow up appointments with the patient. Prescriptions given to patient, medication list explained in detail. Pt verbalized understanding.  Allergies as of 02/22/2018   No Known Allergies     Medication List    TAKE these medications   aspirin 81 MG chewable tablet Chew 81 mg by mouth daily.   Calcium Carb-Cholecalciferol 600-100 MG-UNIT Caps Take 1 tablet by mouth daily. Two by mouth daily What changed:  additional instructions   Dulaglutide 0.75 MG/0.5ML Sopn Commonly known as:  TRULICITY Inject 3.71 mg into the skin once a week.   escitalopram 5 MG tablet Commonly known as:  LEXAPRO Take 1 tablet (5 mg total) by mouth daily. What changed:  when to take this   ezetimibe 10 MG tablet Commonly known as:  ZETIA Take 1 tablet (10 mg total) by mouth daily.   Fish Oil 1000 MG Caps Take 1 capsule by mouth daily.   folic acid 1 MG tablet Commonly known as:  FOLVITE Take 1 mg by mouth See admin instructions. Takes daily except on Saturday   glipiZIDE 5 MG tablet Commonly known as:  GLUCOTROL Take 1 tablet (5 mg total) by mouth 2 (two) times daily before a meal.   glucose blood test strip Commonly known as:  FREESTYLE LITE Use as instructed to test blood sugar twice daily   hydrochlorothiazide 12.5 MG tablet Commonly known as:  HYDRODIURIL Take 1 tablet (12.5 mg total) by mouth daily.   ibuprofen 800 MG tablet Commonly known as:  ADVIL,MOTRIN Take 1 tablet (800 mg total) by mouth every 8 (eight) hours as needed.   lisinopril 40 MG tablet Commonly known as:  PRINIVIL,ZESTRIL Take 1 tablet (40 mg total) by mouth daily.   loratadine 10 MG tablet Commonly known as:  CLARITIN Take 1 tablet (10 mg total) by mouth daily.   metFORMIN 750 MG 24 hr tablet Commonly known as:  GLUCOPHAGE XR Take 1 tablet (750 mg total) by mouth daily with breakfast.   methotrexate 2.5 MG tablet Commonly known as:   RHEUMATREX Take 7.5 mg by mouth every Saturday.   montelukast 10 MG tablet Commonly known as:  SINGULAIR Take 1 tablet (10 mg total) by mouth at bedtime.   multivitamin capsule Take 1 capsule by mouth daily.   omeprazole 20 MG capsule Commonly known as:  PRILOSEC Take 1 capsule (20 mg total) by mouth daily.   oxyCODONE 5 MG immediate release tablet Commonly known as:  Oxy IR/ROXICODONE Take 1-2 tablets (5-10 mg total) by mouth every 4 (four) hours as needed for moderate pain or severe pain.   PRECISION THIN LANCETS Misc Use as directed to check sugars twice daily   simvastatin 40 MG tablet Commonly known as:  ZOCOR Take 1 tablet (40 mg total) by mouth daily. What changed:  when to take this   vitamin C 1000 MG tablet Take 1,000 mg by mouth daily.   vitamin E 400 UNIT capsule Take 400 Units by mouth daily.       Vitals:   02/22/18 0734 02/22/18 1303  BP: (!) 153/70 136/62  Pulse: 64 82  Resp: 18 18  Temp: 97.8 F (36.6 C) 98.1 F (36.7 C)  SpO2: 95% 96%    Skin clean, dry and intact without evidence of skin break down, no evidence of skin tears noted. IV catheter discontinued intact. Site without signs and symptoms of complications. Dressing and pressure applied. Pt denies pain  at this time. No complaints noted.  An After Visit Summary was printed and given to the patient. Patient escorted via [place], and D/C home via private auto.  Emily Ryan A Ellen Goris

## 2018-02-22 NOTE — Care Management Important Message (Signed)
Copy of signed Medicare IM left with patient in room. 

## 2018-02-24 DIAGNOSIS — Z4801 Encounter for change or removal of surgical wound dressing: Secondary | ICD-10-CM | POA: Diagnosis not present

## 2018-02-24 DIAGNOSIS — K5730 Diverticulosis of large intestine without perforation or abscess without bleeding: Secondary | ICD-10-CM | POA: Diagnosis not present

## 2018-02-24 DIAGNOSIS — E119 Type 2 diabetes mellitus without complications: Secondary | ICD-10-CM | POA: Diagnosis not present

## 2018-02-24 DIAGNOSIS — Z4803 Encounter for change or removal of drains: Secondary | ICD-10-CM | POA: Diagnosis not present

## 2018-02-24 DIAGNOSIS — Z7984 Long term (current) use of oral hypoglycemic drugs: Secondary | ICD-10-CM | POA: Diagnosis not present

## 2018-02-24 DIAGNOSIS — Z48815 Encounter for surgical aftercare following surgery on the digestive system: Secondary | ICD-10-CM | POA: Diagnosis not present

## 2018-02-27 DIAGNOSIS — Z48815 Encounter for surgical aftercare following surgery on the digestive system: Secondary | ICD-10-CM | POA: Diagnosis not present

## 2018-02-27 DIAGNOSIS — K5730 Diverticulosis of large intestine without perforation or abscess without bleeding: Secondary | ICD-10-CM | POA: Diagnosis not present

## 2018-02-27 DIAGNOSIS — Z7984 Long term (current) use of oral hypoglycemic drugs: Secondary | ICD-10-CM | POA: Diagnosis not present

## 2018-02-27 DIAGNOSIS — Z4801 Encounter for change or removal of surgical wound dressing: Secondary | ICD-10-CM | POA: Diagnosis not present

## 2018-02-27 DIAGNOSIS — Z4803 Encounter for change or removal of drains: Secondary | ICD-10-CM | POA: Diagnosis not present

## 2018-02-27 DIAGNOSIS — E119 Type 2 diabetes mellitus without complications: Secondary | ICD-10-CM | POA: Diagnosis not present

## 2018-03-02 ENCOUNTER — Other Ambulatory Visit: Payer: Self-pay

## 2018-03-02 ENCOUNTER — Ambulatory Visit (INDEPENDENT_AMBULATORY_CARE_PROVIDER_SITE_OTHER): Payer: Medicare Other | Admitting: Surgery

## 2018-03-02 ENCOUNTER — Encounter: Payer: Self-pay | Admitting: Surgery

## 2018-03-02 VITALS — BP 144/83 | HR 79 | Temp 92.8°F | Ht 59.0 in | Wt 137.0 lb

## 2018-03-02 DIAGNOSIS — E119 Type 2 diabetes mellitus without complications: Secondary | ICD-10-CM | POA: Diagnosis not present

## 2018-03-02 DIAGNOSIS — Z7984 Long term (current) use of oral hypoglycemic drugs: Secondary | ICD-10-CM | POA: Diagnosis not present

## 2018-03-02 DIAGNOSIS — Z4803 Encounter for change or removal of drains: Secondary | ICD-10-CM | POA: Diagnosis not present

## 2018-03-02 DIAGNOSIS — Z09 Encounter for follow-up examination after completed treatment for conditions other than malignant neoplasm: Secondary | ICD-10-CM

## 2018-03-02 DIAGNOSIS — K5730 Diverticulosis of large intestine without perforation or abscess without bleeding: Secondary | ICD-10-CM | POA: Diagnosis not present

## 2018-03-02 DIAGNOSIS — K913 Postprocedural intestinal obstruction, unspecified as to partial versus complete: Secondary | ICD-10-CM

## 2018-03-02 DIAGNOSIS — Z48815 Encounter for surgical aftercare following surgery on the digestive system: Secondary | ICD-10-CM | POA: Diagnosis not present

## 2018-03-02 DIAGNOSIS — Z4801 Encounter for change or removal of surgical wound dressing: Secondary | ICD-10-CM | POA: Diagnosis not present

## 2018-03-02 NOTE — Progress Notes (Signed)
03/02/2018  HPI: Emily Ryan is a 73 y.o. female s/p laparoscopic converted to open resection of colorectal anastomotic stricture on 2/17.  She was discharged on 2/20 with Blake drain in place and staples at the incisions.  She presents for follow up.  Reports doing well with improving mobility.  Denies any issues with constipation, straining, or difficulty with bowel movements.  Tolerating diet.  Drain has been having low output.  Vital signs: BP (!) 144/83   Pulse 79   Temp (!) 92.8 F (33.8 C) (Temporal)   Ht 4\' 11"  (1.499 m)   Wt 137 lb (62.1 kg)   SpO2 92%   BMI 27.67 kg/m    Physical Exam: Constitutional: No acute distress Abdomen:  Soft, non-distended, appropriately tender to palpation.  Incisions clean, dry, intact with staples in place.  Blake drain in LLQ with serosanguinous fluid.  Drain removed without complications and dry gauze dressing applied.  Staples removed and steri strips applied.  Assessment/Plan: This is a 73 y.o. female s/p laparoscopic converted to open resection of colorectal anastomotic stricture.  --Patient doing very well at this point and healing appropriately.  Drain removed, staples removed. --Continue diet as tolerated.  3 more weeks of no heavy lifting or pushing.  May resume normal activities afterwards. --Patient will follow up on 3/11 to check on her progress.  She is going on a trip to [place] on 3/13.   Melvyn Neth, [facility]

## 2018-03-02 NOTE — Patient Instructions (Signed)
Patient will need to be seen on 03/14/2018.   Call the office with any questions or concerns.

## 2018-03-06 DIAGNOSIS — Z4801 Encounter for change or removal of surgical wound dressing: Secondary | ICD-10-CM | POA: Diagnosis not present

## 2018-03-06 DIAGNOSIS — Z4803 Encounter for change or removal of drains: Secondary | ICD-10-CM | POA: Diagnosis not present

## 2018-03-06 DIAGNOSIS — E119 Type 2 diabetes mellitus without complications: Secondary | ICD-10-CM | POA: Diagnosis not present

## 2018-03-06 DIAGNOSIS — Z7984 Long term (current) use of oral hypoglycemic drugs: Secondary | ICD-10-CM | POA: Diagnosis not present

## 2018-03-06 DIAGNOSIS — K5730 Diverticulosis of large intestine without perforation or abscess without bleeding: Secondary | ICD-10-CM | POA: Diagnosis not present

## 2018-03-06 DIAGNOSIS — Z48815 Encounter for surgical aftercare following surgery on the digestive system: Secondary | ICD-10-CM | POA: Diagnosis not present

## 2018-03-09 DIAGNOSIS — E119 Type 2 diabetes mellitus without complications: Secondary | ICD-10-CM | POA: Diagnosis not present

## 2018-03-09 DIAGNOSIS — Z48815 Encounter for surgical aftercare following surgery on the digestive system: Secondary | ICD-10-CM | POA: Diagnosis not present

## 2018-03-09 DIAGNOSIS — Z4801 Encounter for change or removal of surgical wound dressing: Secondary | ICD-10-CM | POA: Diagnosis not present

## 2018-03-09 DIAGNOSIS — Z4803 Encounter for change or removal of drains: Secondary | ICD-10-CM | POA: Diagnosis not present

## 2018-03-09 DIAGNOSIS — Z7984 Long term (current) use of oral hypoglycemic drugs: Secondary | ICD-10-CM | POA: Diagnosis not present

## 2018-03-09 DIAGNOSIS — K5730 Diverticulosis of large intestine without perforation or abscess without bleeding: Secondary | ICD-10-CM | POA: Diagnosis not present

## 2018-03-14 ENCOUNTER — Encounter: Payer: Self-pay | Admitting: Surgery

## 2018-03-14 ENCOUNTER — Other Ambulatory Visit: Payer: Self-pay

## 2018-03-14 ENCOUNTER — Ambulatory Visit (INDEPENDENT_AMBULATORY_CARE_PROVIDER_SITE_OTHER): Payer: Medicare Other | Admitting: Surgery

## 2018-03-14 VITALS — BP 130/85 | HR 78 | Temp 97.5°F | Resp 14 | Ht 59.0 in | Wt 136.0 lb

## 2018-03-14 DIAGNOSIS — Z09 Encounter for follow-up examination after completed treatment for conditions other than malignant neoplasm: Secondary | ICD-10-CM

## 2018-03-14 DIAGNOSIS — K913 Postprocedural intestinal obstruction, unspecified as to partial versus complete: Secondary | ICD-10-CM

## 2018-03-14 NOTE — Progress Notes (Signed)
03/14/2018  HPI: Emily Ryan is a 73 y.o. female s/p laparoscopic converted to open resection of colorectal anastomosis on 02/19/18.  Patient presents for follow up.  On her last visit, her drain was removed and her staples were removed and changed to steri strips.  She has been doing well since and reports no pain anymore and continues to do well from GI standpoint without any constipation.  Vital signs: BP 130/85   Pulse 78   Temp (!) 97.5 F (36.4 C)   Resp 14   Ht 4\' 11"  (1.499 m)   Wt 136 lb (61.7 kg)   SpO2 97%   BMI 27.47 kg/m    Physical Exam: Constitutional: No acute distress Abdomen:  Soft, non-distended, non-tender to palpation.  All incisions are clean, dry, intact, healing well, without any evidence of infection.  Assessment/Plan: This is a 73 y.o. female s/p laparoscopic converted to open resection of colorectal anastomosis.  --Discussed with her that she's doing very well and I'm not worried about any complications at this point.  She has a trip with her sisters to [facility] this week that she had planned for a long time.  Now with COVID-19, she is debating about whether she should go or not.  No contraindications from surgical standpoint, but did recommend that she visit the [facility] website for further instructions and recommendations such as frequent hand-washing, social distancing, be mindful of any symptoms, etc.  She will decide with her family about canceling or going on her trip. --May follow up with [place] prn.   Melvyn Neth, [facility]

## 2018-03-14 NOTE — Patient Instructions (Addendum)
The patient is aware to call back for any questions or new concerns. Resume activities as tolerated over the next 4 weeks May use vitamin E or scar fade on the incision as needed  GENERAL POST-OPERATIVE PATIENT INSTRUCTIONS   WOUND CARE INSTRUCTIONS:  Keep a dry clean dressing on the wound if there is drainage. The initial bandage may be removed after 24 hours.  Once the wound has quit draining you may leave it open to air.  If clothing rubs against the wound or causes irritation and the wound is not draining you may cover it with a dry dressing during the daytime.  Try to keep the wound dry and avoid ointments on the wound unless directed to do so.  If the wound becomes bright red and painful or starts to drain infected material that is not clear, please contact your physician immediately.  If the wound is mildly pink and has a thick firm ridge underneath it, this is normal, and is referred to as a healing ridge.  This will resolve over the next 4-6 weeks.  BATHING: You may shower if you have been informed of this by your surgeon. However, Please do not submerge in a tub, hot tub, or pool until incisions are completely sealed or have been told by your surgeon that you may do so.  DIET:  You may eat any foods that you can tolerate.  It is a good idea to eat a high fiber diet and take in plenty of fluids to prevent constipation.  If you do become constipated you may want to take a mild laxative or take ducolax tablets on a daily basis until your bowel habits are regular.  Constipation can be very uncomfortable, along with straining, after recent surgery.  ACTIVITY:  You are encouraged to cough and deep breath or use your incentive spirometer if you were given one, every 15-30 minutes when awake.  This will help prevent respiratory complications and low grade fevers post-operatively if you had a general anesthetic.  You may want to hug a pillow when coughing and sneezing to add additional support to the  surgical area, if you had abdominal or chest surgery, which will decrease pain during these times.  You are encouraged to walk and engage in light activity for the next two weeks.    At that time- Listen to your body when lifting, if you have pain when lifting, stop and then try again in a few days. Soreness after doing exercises or activities of daily living is normal as you get back in to your normal routine.  MEDICATIONS:  Try to take narcotic medications and anti-inflammatory medications, such as tylenol, ibuprofen, naprosyn, etc., with food.  This will minimize stomach upset from the medication.  Should you develop nausea and vomiting from the pain medication, or develop a rash, please discontinue the medication and contact your physician.  You should not drive, make important decisions, or operate machinery when taking narcotic pain medication.  SUNBLOCK Use sun block to incision area over the next year if this area will be exposed to sun. This helps decrease scarring and will allow you avoid a permanent darkened area over your incision.  QUESTIONS:  Please feel free to call our office if you have any questions, and we will be glad to assist you.

## 2018-03-16 DIAGNOSIS — Z7984 Long term (current) use of oral hypoglycemic drugs: Secondary | ICD-10-CM | POA: Diagnosis not present

## 2018-03-16 DIAGNOSIS — Z4801 Encounter for change or removal of surgical wound dressing: Secondary | ICD-10-CM | POA: Diagnosis not present

## 2018-03-16 DIAGNOSIS — E119 Type 2 diabetes mellitus without complications: Secondary | ICD-10-CM | POA: Diagnosis not present

## 2018-03-16 DIAGNOSIS — Z48815 Encounter for surgical aftercare following surgery on the digestive system: Secondary | ICD-10-CM | POA: Diagnosis not present

## 2018-03-16 DIAGNOSIS — K5730 Diverticulosis of large intestine without perforation or abscess without bleeding: Secondary | ICD-10-CM | POA: Diagnosis not present

## 2018-03-16 DIAGNOSIS — Z4803 Encounter for change or removal of drains: Secondary | ICD-10-CM | POA: Diagnosis not present

## 2018-04-11 ENCOUNTER — Encounter: Payer: Self-pay | Admitting: Internal Medicine

## 2018-04-11 ENCOUNTER — Ambulatory Visit (INDEPENDENT_AMBULATORY_CARE_PROVIDER_SITE_OTHER): Payer: Medicare Other | Admitting: Internal Medicine

## 2018-04-11 VITALS — BP 119/64 | HR 85 | Wt 132.0 lb

## 2018-04-11 DIAGNOSIS — E663 Overweight: Secondary | ICD-10-CM | POA: Diagnosis not present

## 2018-04-11 DIAGNOSIS — Z79899 Other long term (current) drug therapy: Secondary | ICD-10-CM | POA: Diagnosis not present

## 2018-04-11 DIAGNOSIS — E119 Type 2 diabetes mellitus without complications: Secondary | ICD-10-CM | POA: Diagnosis not present

## 2018-04-11 DIAGNOSIS — E782 Mixed hyperlipidemia: Secondary | ICD-10-CM

## 2018-04-11 DIAGNOSIS — I10 Essential (primary) hypertension: Secondary | ICD-10-CM

## 2018-04-11 MED ORDER — LOSARTAN POTASSIUM 100 MG PO TABS: 100.0000 mg | ORAL_TABLET | Freq: Every day | ORAL | 3 refills | Status: DC

## 2018-04-11 NOTE — Progress Notes (Signed)
Virtual Visit via  [email or web address]  This visit type was conducted due to national recommendations for restrictions regarding the COVID-19 pandemic (e.g. social distancing).  This format is felt to be most appropriate for this patient at this time.  All issues noted in this document were discussed and addressed.  No physical exam was performed (except for noted visual exam findings with Video Visits).   I connected with@ on 04/11/18 at  9:30 AM EDT by a video enabled telemedicine application or telephone and verified that I am speaking with the correct person using two identifiers. Location patient: home Location provider: work  Persons participating in the virtual visit: patient, provider  I discussed the limitations, risks, security and privacy concerns of performing an evaluation and management service by telephone and the availability of in person appointments. I also discussed with the patient that there may be a patient responsible charge related to this service. The patient expressed understanding and agreed to proceed.   Reason for visit: follow up on chronic conditions including Type 2 DM hyperlipidemia and hypertension   HPI:  4 month follow up on diabetes.  Patient has no complaints today.  Patient is following a low glycemic index/Mediterranean  diet and taking all prescribed medications regularly without side effects.  Fasting sugars have been  less than 140  95% of the time and post prandials have been under 160 except on rare occasions. Patient is exercising daily by walking  And kayaking and  intentionally trying to lose weight .  Patient has had an eye exam in the last 12 months and checks feet regularly for signs of infection.  Patient does not walk barefoot outside,  And denies any numbness tingling or burning in feet. Patient is up to date on all recommended vaccinations  Hypertension: patient checks blood pressure twice weekly at home.  Readings have been at or around  140/90,  With  119/64,  129/94 as her most recent readings .  She is taking her medications  lisinopril 40 mg daily without side effects   . Patient is following a reduced salt diet most days and is taking medications as prescribed. Taking lisinopril 40 mg n the am with hctz.    Since her last visit with me she underwent  partial sigmoid colectomy on  Feb 17 to manage a colorectal stricture secondary to adhesions.  surgery was done  by [place]  . Ureteral catheters were placed intraoperatively by Urology to assist with visualization . Left ureteral stent and foley were removed POD #1 and Pod #2.  Patient is very pleased with the surgical outcome. Has recently returned to kayaking . Using a fiber supplement once daily and having stool regularity daily  . Lost weight after the surgery due to decreased intake for 7 days. Appetite is back to normal  Home weight  is 132 lbs,     ROS: See pertinent positives and negatives per HPI.  Past Medical History:  Diagnosis Date  . Complicated pregnancy    1st pregnancy complicated by post operative hemorrhage and 2ng complicated by epidural  . Diabetes mellitus   . Diverticulosis of colon (without mention of hemorrhage)   . GERD (gastroesophageal reflux disease)   . [place] psoriasis   . Hyperlipidemia   . Hypertension   . Menopausal disorder   . Skin cancer     Past Surgical History:  Procedure Laterality Date  . ABDOMINAL HYSTERECTOMY    . APPENDECTOMY  Dec 2012  . [place]  lift   . COLON RESECTION SIGMOID N/A 02/19/2018   Procedure: LAPAROSCOPIC SIGMOID COLECTOMY- POSSIBLE OPEN;  Surgeon: Olean Ree, MD;  Location: [facility] ORS;  Service: General;  Laterality: N/A;  . COLONOSCOPY WITH PROPOFOL N/A 07/17/2017   Procedure: COLONOSCOPY WITH PROPOFOL;  Surgeon: Lin Landsman, MD;  Location: [facility] ENDOSCOPY;  Service: Gastroenterology;  Laterality: N/A;  . COLONOSCOPY WITH PROPOFOL N/A 11/02/2017   Procedure: COLONOSCOPY WITH PROPOFOL;  Surgeon:  Lin Landsman, MD;  Location: [facility] ENDOSCOPY;  Service: Gastroenterology;  Laterality: N/A;  . CYSTOCELE REPAIR    . CYSTOSCOPY W/ URETERAL STENT PLACEMENT Bilateral 02/19/2018   Procedure: CYSTOSCOPY WITH STENT PLACEMENT GOING 1ST;  Surgeon: Billey Co, MD;  Location: [facility] ORS;  Service: Urology;  Laterality: Bilateral;  . FLEXIBLE SIGMOIDOSCOPY N/A 07/31/2017   Procedure: FLEXIBLE SIGMOIDOSCOPY;  Surgeon: Lin Landsman, MD;  Location: [facility] ENDOSCOPY;  Service: Gastroenterology;  Laterality: N/A;  . HERNIA REPAIR    . MOHS SURGERY N/A 05/2015   Face  . PARTIAL COLECTOMY  Nov 2012   left, secondary to diverticular perf  . RECTOCELE REPAIR    . SEPTOPLASTY  1969  . TEE WITHOUT CARDIOVERSION N/A 11/24/2015   Procedure: TRANSESOPHAGEAL ECHOCARDIOGRAM (TEE);  Surgeon: Minna Merritts, MD;  Location: [facility] ORS;  Service: Cardiovascular;  Laterality: N/A;  . TONSILLECTOMY  1953  . VARICOSE VEIN SURGERY  1974   right leg     Family History  Problem Relation Age of Onset  . Heart Problems Mother   . Macular degeneration Mother   . Cancer Father   . Cancer Maternal Grandmother        colon ca  . Diabetes Sister   . Dementia Sister   . Diabetes Brother   . Stroke Brother   . Diabetes Brother   . Breast cancer Maternal Aunt   . Breast cancer Paternal Aunt   . Lung cancer Cousin   . Lung disease Neg Hx   . Rheumatologic disease Neg Hx     SOCIAL HX: married , retired, IADLS   Current Outpatient Medications:  .  Ascorbic Acid (VITAMIN C) 1000 MG tablet, Take 1,000 mg by mouth daily. , Disp: , Rfl:  .  aspirin 81 MG chewable tablet, Chew 81 mg by mouth daily. , Disp: , Rfl:  .  Calcium Carb-Cholecalciferol 600-100 MG-UNIT CAPS, Take 1 tablet by mouth daily. Two by mouth daily (Patient taking differently: Take 1 tablet by mouth daily. ), Disp: 90 capsule, Rfl: 3 .  Dulaglutide (TRULICITY) 5.57 DU/2.0UR SOPN, Inject 0.75 mg into the skin once a week., Disp: 13 pen, Rfl:  3 .  escitalopram (LEXAPRO) 5 MG tablet, Take 1 tablet (5 mg total) by mouth daily. (Patient taking differently: Take 5 mg by mouth every evening. ), Disp: 90 tablet, Rfl: 3 .  ezetimibe (ZETIA) 10 MG tablet, Take 1 tablet (10 mg total) by mouth daily., Disp: 90 tablet, Rfl: 3 .  folic acid (FOLVITE) 1 MG tablet, Take 1 mg by mouth See admin instructions. Takes daily except on Saturday, Disp: , Rfl:  .  glipiZIDE (GLUCOTROL) 5 MG tablet, Take 1 tablet (5 mg total) by mouth 2 (two) times daily before a meal., Disp: 180 tablet, Rfl: 3 .  glucose blood (FREESTYLE LITE) test strip, Use as instructed to test blood sugar twice daily, Disp: 200 each, Rfl: 3 .  hydrochlorothiazide (HYDRODIURIL) 12.5 MG tablet, Take 1 tablet (12.5 mg total) by mouth daily., Disp: 90 tablet, Rfl: 3 .  loratadine (CLARITIN) 10 MG tablet, Take 1 tablet (10 mg total) by mouth daily., Disp: 90 tablet, Rfl: 3 .  metFORMIN (GLUCOPHAGE XR) 750 MG 24 hr tablet, Take 1 tablet (750 mg total) by mouth daily with breakfast., Disp: 90 tablet, Rfl: 3 .  methotrexate (RHEUMATREX) 2.5 MG tablet, Take 7.5 mg by mouth every Saturday. , Disp: , Rfl:  .  montelukast (SINGULAIR) 10 MG tablet, Take 1 tablet (10 mg total) by mouth at bedtime., Disp: 90 tablet, Rfl: 3 .  Multiple Vitamin (MULTIVITAMIN) capsule, Take 1 capsule by mouth daily.  , Disp: , Rfl:  .  Omega-3 Fatty Acids (FISH OIL) 1000 MG CAPS, Take 1 capsule by mouth daily., Disp: , Rfl:  .  omeprazole (PRILOSEC) 20 MG capsule, Take 1 capsule (20 mg total) by mouth daily., Disp: 90 capsule, Rfl: 3 .  Precision Thin Lancets MISC, Use as directed to check sugars twice daily, Disp: 180 each, Rfl: 3 .  simvastatin (ZOCOR) 40 MG tablet, Take 1 tablet (40 mg total) by mouth daily. (Patient taking differently: Take 40 mg by mouth at bedtime. ), Disp: 90 tablet, Rfl: 3 .  vitamin E 400 UNIT capsule, Take 400 Units by mouth daily.  , Disp: , Rfl:  .  losartan (COZAAR) 100 MG tablet, Take 1 tablet  (100 mg total) by mouth daily., Disp: 90 tablet, Rfl: 3  EXAM:  VITALS per patient if applicable:  GENERAL: alert, oriented, appears well and in no acute distress  HEENT: atraumatic, conjunttiva clear, no obvious abnormalities on inspection of external nose and ears  NECK: normal movements of the head and neck  LUNGS: on inspection no signs of respiratory distress, breathing rate appears normal, no obvious gross SOB, gasping or wheezing  CV: no obvious cyanosis  MS: moves all visible extremities without noticeable abnormality  PSYCH/NEURO: pleasant and cooperative, no obvious depression or anxiety, speech and thought processing grossly intact  ASSESSMENT AND PLAN:  Discussed the following assessment and plan:   I discussed the assessment and treatment plan with the patient. The patient was provided an opportunity to ask questions and all were answered. The patient agreed with the plan and demonstrated an understanding of the instructions.   The patient was advised to call back or seek an in-person evaluation if the symptoms worsen or if the condition fails to improve as anticipated.  I provided 25 minutes of non-face-to-face time during this encounter.   Crecencio Mc, MD

## 2018-04-12 ENCOUNTER — Other Ambulatory Visit: Payer: Self-pay

## 2018-04-12 ENCOUNTER — Other Ambulatory Visit (INDEPENDENT_AMBULATORY_CARE_PROVIDER_SITE_OTHER): Payer: Medicare Other

## 2018-04-12 DIAGNOSIS — E782 Mixed hyperlipidemia: Secondary | ICD-10-CM

## 2018-04-12 DIAGNOSIS — E119 Type 2 diabetes mellitus without complications: Secondary | ICD-10-CM | POA: Diagnosis not present

## 2018-04-12 DIAGNOSIS — I10 Essential (primary) hypertension: Secondary | ICD-10-CM

## 2018-04-12 DIAGNOSIS — Z79899 Other long term (current) drug therapy: Secondary | ICD-10-CM | POA: Diagnosis not present

## 2018-04-12 DIAGNOSIS — E7800 Pure hypercholesterolemia, unspecified: Secondary | ICD-10-CM

## 2018-04-12 LAB — CBC WITH DIFFERENTIAL/PLATELET
Basophils Absolute: 0 10*3/uL (ref 0.0–0.1)
Basophils Relative: 0.6 % (ref 0.0–3.0)
Eosinophils Absolute: 0.1 10*3/uL (ref 0.0–0.7)
Eosinophils Relative: 1.7 % (ref 0.0–5.0)
HCT: 42.9 % (ref 36.0–46.0)
Hemoglobin: 14.6 g/dL (ref 12.0–15.0)
Lymphocytes Relative: 25.4 % (ref 12.0–46.0)
Lymphs Abs: 1.6 10*3/uL (ref 0.7–4.0)
MCHC: 34 g/dL (ref 30.0–36.0)
MCV: 100.9 fl — ABNORMAL HIGH (ref 78.0–100.0)
Monocytes Absolute: 0.8 10*3/uL (ref 0.1–1.0)
Monocytes Relative: 12.2 % — ABNORMAL HIGH (ref 3.0–12.0)
Neutro Abs: 3.9 10*3/uL (ref 1.4–7.7)
Neutrophils Relative %: 60.1 % (ref 43.0–77.0)
Platelets: 289 10*3/uL (ref 150.0–400.0)
RBC: 4.25 Mil/uL (ref 3.87–5.11)
RDW: 13.6 % (ref 11.5–15.5)
WBC: 6.5 10*3/uL (ref 4.0–10.5)

## 2018-04-12 LAB — COMPREHENSIVE METABOLIC PANEL
ALT: 26 U/L (ref 0–35)
AST: 22 U/L (ref 0–37)
Albumin: 4.4 g/dL (ref 3.5–5.2)
Alkaline Phosphatase: 74 U/L (ref 39–117)
BUN: 18 mg/dL (ref 6–23)
CO2: 29 mEq/L (ref 19–32)
Calcium: 9.2 mg/dL (ref 8.4–10.5)
Chloride: 99 mEq/L (ref 96–112)
Creatinine, Ser: 0.74 mg/dL (ref 0.40–1.20)
GFR: 76.83 mL/min (ref >60.00)
Glucose, Bld: 141 mg/dL — ABNORMAL HIGH (ref 70–99)
Potassium: 4.4 mEq/L (ref 3.5–5.1)
Sodium: 137 mEq/L (ref 135–145)
Total Bilirubin: 0.6 mg/dL (ref 0.2–1.2)
Total Protein: 7 g/dL (ref 6.0–8.3)

## 2018-04-12 LAB — LIPID PANEL
Cholesterol: 153 mg/dL (ref 0–200)
HDL: 52.3 mg/dL (ref >39.00)
LDL Cholesterol: 78 mg/dL (ref 0–99)
NonHDL: 100.4
Total CHOL/HDL Ratio: 3
Triglycerides: 110 mg/dL (ref 0.0–149.0)
VLDL: 22 mg/dL (ref 0.0–40.0)

## 2018-04-12 LAB — HEMOGLOBIN A1C: Hgb A1c MFr Bld: 7 % — ABNORMAL HIGH (ref 4.6–6.5)

## 2018-04-12 LAB — LDL CHOLESTEROL, DIRECT: Direct LDL: 72 mg/dL

## 2018-04-12 NOTE — Addendum Note (Signed)
Addended by: Leeanne Rio on: 04/12/2018 08:26 AM   Modules accepted: Orders

## 2018-04-13 ENCOUNTER — Encounter: Payer: Self-pay | Admitting: Internal Medicine

## 2018-04-13 NOTE — Assessment & Plan Note (Signed)
Currently managed with lisinopril and hct.  I am making a decision to change patient's ACE Inhibitor to losartan 100 mg daily  based on increased reports of  angioedema.  I also advised patient to to take it at night instead of morning,  as recent studies have shown a reduction in incidence of heart attacks and strokes.

## 2018-04-13 NOTE — Assessment & Plan Note (Signed)
LDL is < 100 and triglycerides are at goal on simvastatin/zetia . She has no side effects and liver enzymes are normal. No changes today. Continue aspirin low dose.  Lab Results  Component Value Date   CHOL 153 04/12/2018   HDL 52.30 04/12/2018   LDLCALC 78 04/12/2018   LDLDIRECT 72.0 04/12/2018   TRIG 110.0 04/12/2018   CHOLHDL 3 04/12/2018   Lab Results  Component Value Date   ALT 26 04/12/2018   AST 22 04/12/2018   ALKPHOS 74 04/12/2018   BILITOT 0.6 04/12/2018

## 2018-04-13 NOTE — Assessment & Plan Note (Addendum)
Improved control.  She is at goal   Continue Trulicity ,  glipizide and metformin . Continue  Low GI  Diet., asa, ARB and statin/Zetia No changes today .   Lab Results  Component Value Date   HGBA1C 7.0 (H) 04/12/2018  ` Lab Results  Component Value Date   MICROALBUR 1.1 11/29/2017

## 2018-04-14 ENCOUNTER — Other Ambulatory Visit: Payer: Self-pay | Admitting: Internal Medicine

## 2018-04-14 ENCOUNTER — Encounter: Payer: Self-pay | Admitting: Internal Medicine

## 2018-04-14 DIAGNOSIS — D7589 Other specified diseases of blood and blood-forming organs: Secondary | ICD-10-CM | POA: Insufficient documentation

## 2018-04-14 DIAGNOSIS — Z79899 Other long term (current) drug therapy: Secondary | ICD-10-CM

## 2018-04-14 DIAGNOSIS — E119 Type 2 diabetes mellitus without complications: Secondary | ICD-10-CM

## 2018-04-14 NOTE — Progress Notes (Signed)
b1

## 2018-04-20 ENCOUNTER — Encounter: Payer: Self-pay | Admitting: Surgery

## 2018-05-09 MED ORDER — ESCITALOPRAM OXALATE 5 MG PO TABS: 5.0000 mg | ORAL_TABLET | Freq: Every evening | ORAL | 3 refills | Status: DC

## 2018-05-30 ENCOUNTER — Ambulatory Visit: Payer: Medicare Other | Admitting: Emergency Medicine

## 2018-05-30 ENCOUNTER — Ambulatory Visit: Payer: Medicare Other | Admitting: Pulmonary Disease

## 2018-06-12 DIAGNOSIS — E119 Type 2 diabetes mellitus without complications: Secondary | ICD-10-CM | POA: Diagnosis not present

## 2018-06-12 LAB — HM DIABETES EYE EXAM

## 2018-06-18 NOTE — Telephone Encounter (Signed)
I do not see amlodipine in the pt's current medication list.

## 2018-06-19 ENCOUNTER — Other Ambulatory Visit: Payer: Self-pay | Admitting: Internal Medicine

## 2018-06-19 MED ORDER — AMLODIPINE BESYLATE 2.5 MG PO TABS: 2.5000 mg | ORAL_TABLET | Freq: Every day | ORAL | 3 refills | Status: DC

## 2018-08-01 ENCOUNTER — Telehealth: Payer: Self-pay | Admitting: Internal Medicine

## 2018-08-01 NOTE — Telephone Encounter (Signed)
Yes, she has been taking both and having no problems

## 2018-08-01 NOTE — Telephone Encounter (Signed)
Spoke with Verdis Frederickson, pharmacist and informed her that Dr. Derrel Nip is aware of the interaction and the pt has been taking the medications with no issues.

## 2018-08-01 NOTE — Telephone Encounter (Signed)
Animas stated pt tried to fill simvastatin (ZOCOR) 40 MG tablet but noticed pt filled amlodipine on 06/19/18. Stated there is a drug interaction with both medications and requesting callback clarifying if pt is supposed to be taking both. Or a new rx. Zip Code [phone] CB#[phone]

## 2018-08-07 ENCOUNTER — Other Ambulatory Visit: Payer: Self-pay | Admitting: Internal Medicine

## 2018-08-07 DIAGNOSIS — Z1231 Encounter for screening mammogram for malignant neoplasm of breast: Secondary | ICD-10-CM

## 2018-08-08 ENCOUNTER — Other Ambulatory Visit: Payer: Self-pay

## 2018-08-08 ENCOUNTER — Other Ambulatory Visit (INDEPENDENT_AMBULATORY_CARE_PROVIDER_SITE_OTHER): Payer: Medicare Other

## 2018-08-08 DIAGNOSIS — D7589 Other specified diseases of blood and blood-forming organs: Secondary | ICD-10-CM

## 2018-08-08 DIAGNOSIS — Z79899 Other long term (current) drug therapy: Secondary | ICD-10-CM

## 2018-08-08 DIAGNOSIS — E119 Type 2 diabetes mellitus without complications: Secondary | ICD-10-CM

## 2018-08-08 LAB — B12 AND FOLATE PANEL
Folate: >23.9 ng/mL (ref >5.9)
Vitamin B-12: 1233 pg/mL — ABNORMAL HIGH (ref 211–911)

## 2018-08-08 LAB — CBC
HCT: 41.6 % (ref 36.0–46.0)
Hemoglobin: 14 g/dL (ref 12.0–15.0)
MCHC: 33.6 g/dL (ref 30.0–36.0)
MCV: 98.5 fl (ref 78.0–100.0)
Platelets: 244 10*3/uL (ref 150.0–400.0)
RBC: 4.23 Mil/uL (ref 3.87–5.11)
RDW: 13.9 % (ref 11.5–15.5)
WBC: 5.8 10*3/uL (ref 4.0–10.5)

## 2018-08-08 LAB — COMPREHENSIVE METABOLIC PANEL
ALT: 28 U/L (ref 0–35)
AST: 22 U/L (ref 0–37)
Albumin: 4.3 g/dL (ref 3.5–5.2)
Alkaline Phosphatase: 81 U/L (ref 39–117)
BUN: 16 mg/dL (ref 6–23)
CO2: 30 mEq/L (ref 19–32)
Calcium: 9.5 mg/dL (ref 8.4–10.5)
Chloride: 100 mEq/L (ref 96–112)
Creatinine, Ser: 0.61 mg/dL (ref 0.40–1.20)
GFR: 95.94 mL/min (ref >60.00)
Glucose, Bld: 141 mg/dL — ABNORMAL HIGH (ref 70–99)
Potassium: 4.5 mEq/L (ref 3.5–5.1)
Sodium: 138 mEq/L (ref 135–145)
Total Bilirubin: 0.7 mg/dL (ref 0.2–1.2)
Total Protein: 6.8 g/dL (ref 6.0–8.3)

## 2018-08-08 LAB — HEMOGLOBIN A1C: Hgb A1c MFr Bld: 7.2 % — ABNORMAL HIGH (ref 4.6–6.5)

## 2018-08-08 LAB — MICROALBUMIN / CREATININE URINE RATIO
Creatinine,U: 39.9 mg/dL
Microalb Creat Ratio: 1.8 mg/g (ref 0.0–30.0)
Microalb, Ur: <0.7 mg/dL (ref 0.0–1.9)

## 2018-08-11 LAB — METHYLMALONIC ACID, SERUM: Methylmalonic Acid, Quant: 174 nmol/L (ref 87–318)

## 2018-08-13 ENCOUNTER — Other Ambulatory Visit: Payer: Self-pay

## 2018-08-13 ENCOUNTER — Encounter: Payer: Self-pay | Admitting: Internal Medicine

## 2018-08-13 ENCOUNTER — Ambulatory Visit (INDEPENDENT_AMBULATORY_CARE_PROVIDER_SITE_OTHER): Payer: Medicare Other | Admitting: Internal Medicine

## 2018-08-13 DIAGNOSIS — E119 Type 2 diabetes mellitus without complications: Secondary | ICD-10-CM

## 2018-08-13 DIAGNOSIS — K913 Postprocedural intestinal obstruction, unspecified as to partial versus complete: Secondary | ICD-10-CM

## 2018-08-13 DIAGNOSIS — Z820 Family history of epilepsy and other diseases of the nervous system: Secondary | ICD-10-CM | POA: Diagnosis not present

## 2018-08-13 DIAGNOSIS — I10 Essential (primary) hypertension: Secondary | ICD-10-CM

## 2018-08-13 DIAGNOSIS — D7589 Other specified diseases of blood and blood-forming organs: Secondary | ICD-10-CM

## 2018-08-13 NOTE — Progress Notes (Signed)
Virtual Visit via Doxy,me  This visit type was conducted due to national recommendations for restrictions regarding the COVID-19 pandemic (e.g. social distancing).  This format is felt to be most appropriate for this patient at this time.  All issues noted in this document were discussed and addressed.  No physical exam was performed (except for noted visual exam findings with Video Visits).   I connected with@ on 08/13/18 at  8:30 AM EDT by a video enabled telemedicine application or telephone and verified that I am speaking with the correct person using two identifiers. Location patient: home Location provider: work or home office Persons participating in the virtual visit: patient, provider  I discussed the limitations, risks, security and privacy concerns of performing an evaluation and management service by telephone and the availability of in person appointments. I also discussed with the patient that there may be a patient responsible charge related to this service. The patient expressed understanding and agreed to proceed.  Reason for visit: 4 month follow up on T2DM and hypertension  HPI:  4 month follow up on diabetes.  Patient has no complaints today.  Patient is now following a low glycemic index diet and taking all prescribed medications regularly without side effects.  Fasting sugars have been less than 140 most of the time and post prandials have been under 160 except on rare occasions. Patient is exercising about 5 times per week and intentionally trying to lose weight .  Patient has had an eye exam in the last 12 months and checks feet regularly for signs of infection.  Patient does not walk barefoot outside,  And denies an numbness tingling or burning in feet. Patient is up to date on all recommended vaccinations. She admits that she has been indulging since  Her grandson has been visiting for 6 weeks.  Cooking a lot. Eating ice cream a lot. [employer]   HTN: some  fluctuation noted with medication change from ACE to ARB but has not been taking correctly.  highest was 153/85,  Most < 130/80 .  Has not been taking measurements correctly.  Proper technique reviewed   Kayaking regularly on [facility]   The patient has no signs or symptoms of COVID 19 infection (fever, cough, sore throat  or shortness of breath beyond what is typical for patient).  Patient denies contact with other persons with the above mentioned symptoms or with anyone confirmed to have COVID 19 . Snuck into [place] to attend her grandson's graduation last month.   Sister  Has been diagnosed with AD  At age 81   Bowels moving regularly , 6 months out from surgery (partial colectomy)   ROS: Patient denies headache, fevers, malaise, unintentional weight loss, skin rash, eye pain, sinus congestion and sinus pain, sore throat, dysphagia,  hemoptysis , cough, dyspnea, wheezing, chest pain, palpitations, orthopnea, edema, abdominal pain, nausea, melena, diarrhea, constipation, flank pain, dysuria, hematuria, urinary  Frequency, nocturia, numbness, tingling, seizures,  Focal weakness, Loss of consciousness,  Tremor, insomnia, depression, anxiety, and suicidal ideation.      Past Medical History:  Diagnosis Date  . Complicated pregnancy    1st pregnancy complicated by post operative hemorrhage and 2ng complicated by epidural  . Diabetes mellitus   . Diverticulosis of colon (without mention of hemorrhage)   . Fracture of malleolus of right ankle, closed, with routine healing, subsequent encounter 12/09/2017  . GERD (gastroesophageal reflux disease)   . Guttate psoriasis   . Hyperlipidemia   . Hypertension   .  Menopausal disorder   . Rectal stricture   . Skin cancer     Past Surgical History:  Procedure Laterality Date  . ABDOMINAL HYSTERECTOMY    . APPENDECTOMY  Dec 2012  . BLADDER REPAIR  1991   lift   . COLON RESECTION SIGMOID N/A 02/19/2018   Procedure: LAPAROSCOPIC SIGMOID COLECTOMY-  POSSIBLE OPEN;  Surgeon: Olean Ree, MD;  Location: [facility] ORS;  Service: General;  Laterality: N/A;  . COLONOSCOPY WITH PROPOFOL N/A 07/17/2017   Procedure: COLONOSCOPY WITH PROPOFOL;  Surgeon: Lin Landsman, MD;  Location: [facility] ENDOSCOPY;  Service: Gastroenterology;  Laterality: N/A;  . COLONOSCOPY WITH PROPOFOL N/A 11/02/2017   Procedure: COLONOSCOPY WITH PROPOFOL;  Surgeon: Lin Landsman, MD;  Location: [facility] ENDOSCOPY;  Service: Gastroenterology;  Laterality: N/A;  . CYSTOCELE REPAIR    . CYSTOSCOPY W/ URETERAL STENT PLACEMENT Bilateral 02/19/2018   Procedure: CYSTOSCOPY WITH STENT PLACEMENT GOING 1ST;  Surgeon: Billey Co, MD;  Location: [facility] ORS;  Service: Urology;  Laterality: Bilateral;  . FLEXIBLE SIGMOIDOSCOPY N/A 07/31/2017   Procedure: FLEXIBLE SIGMOIDOSCOPY;  Surgeon: Lin Landsman, MD;  Location: [facility] ENDOSCOPY;  Service: Gastroenterology;  Laterality: N/A;  . HERNIA REPAIR    . MOHS SURGERY N/A 05/2015   Face  . PARTIAL COLECTOMY  Nov 2012   left, secondary to diverticular perf  . RECTOCELE REPAIR    . SEPTOPLASTY  1969  . TEE WITHOUT CARDIOVERSION N/A 11/24/2015   Procedure: TRANSESOPHAGEAL ECHOCARDIOGRAM (TEE);  Surgeon: Minna Merritts, MD;  Location: [facility] ORS;  Service: Cardiovascular;  Laterality: N/A;  . TONSILLECTOMY  1953  . VARICOSE VEIN SURGERY  1974   right leg     Family History  Problem Relation Age of Onset  . Heart Problems Mother   . Macular degeneration Mother   . Cancer Father   . Cancer Maternal Grandmother        colon ca  . Diabetes Sister   . Dementia Sister   . Diabetes Brother   . Stroke Brother   . Diabetes Brother   . Breast cancer Maternal Aunt   . Breast cancer Paternal Aunt   . Lung cancer Cousin   . Lung disease Neg Hx   . Rheumatologic disease Neg Hx     SOCIAL HX:  reports that she has never smoked. She has never used smokeless tobacco. She reports current alcohol use. She reports that she does not use  drugs.   Current Outpatient Medications:  .  amLODipine (NORVASC) 2.5 MG tablet, Take 1 tablet (2.5 mg total) by mouth daily., Disp: 90 tablet, Rfl: 3 .  Ascorbic Acid (VITAMIN C) 1000 MG tablet, Take 1,000 mg by mouth daily. , Disp: , Rfl:  .  aspirin 81 MG chewable tablet, Chew 81 mg by mouth daily. , Disp: , Rfl:  .  Calcium Carb-Cholecalciferol 600-100 MG-UNIT CAPS, Take 1 tablet by mouth daily. Two by mouth daily (Patient taking differently: Take 1 tablet by mouth daily. ), Disp: 90 capsule, Rfl: 3 .  Dulaglutide (TRULICITY) 0.62 IR/4.8NI SOPN, Inject 0.75 mg into the skin once a week., Disp: 13 pen, Rfl: 3 .  escitalopram (LEXAPRO) 5 MG tablet, Take 1 tablet (5 mg total) by mouth every evening., Disp: 90 tablet, Rfl: 3 .  ezetimibe (ZETIA) 10 MG tablet, Take 1 tablet (10 mg total) by mouth daily., Disp: 90 tablet, Rfl: 3 .  folic acid (FOLVITE) 1 MG tablet, Take 1 mg by mouth See admin instructions. Takes daily except  on Saturday, Disp: , Rfl:  .  glipiZIDE (GLUCOTROL) 5 MG tablet, Take 1 tablet (5 mg total) by mouth 2 (two) times daily before a meal., Disp: 180 tablet, Rfl: 3 .  glucose blood (FREESTYLE LITE) test strip, Use as instructed to test blood sugar twice daily, Disp: 200 each, Rfl: 3 .  hydrochlorothiazide (HYDRODIURIL) 12.5 MG tablet, Take 1 tablet (12.5 mg total) by mouth daily., Disp: 90 tablet, Rfl: 3 .  loratadine (CLARITIN) 10 MG tablet, Take 1 tablet (10 mg total) by mouth daily., Disp: 90 tablet, Rfl: 3 .  losartan (COZAAR) 100 MG tablet, Take 1 tablet (100 mg total) by mouth daily., Disp: 90 tablet, Rfl: 3 .  metFORMIN (GLUCOPHAGE XR) 750 MG 24 hr tablet, Take 1 tablet (750 mg total) by mouth daily with breakfast., Disp: 90 tablet, Rfl: 3 .  methotrexate (RHEUMATREX) 2.5 MG tablet, Take 7.5 mg by mouth every Saturday. , Disp: , Rfl:  .  montelukast (SINGULAIR) 10 MG tablet, Take 1 tablet (10 mg total) by mouth at bedtime., Disp: 90 tablet, Rfl: 3 .  Multiple Vitamin  (MULTIVITAMIN) capsule, Take 1 capsule by mouth daily.  , Disp: , Rfl:  .  Omega-3 Fatty Acids (FISH OIL) 1000 MG CAPS, Take 1 capsule by mouth daily., Disp: , Rfl:  .  omeprazole (PRILOSEC) 20 MG capsule, Take 1 capsule (20 mg total) by mouth daily., Disp: 90 capsule, Rfl: 3 .  Precision Thin Lancets MISC, Use as directed to check sugars twice daily, Disp: 180 each, Rfl: 3 .  simvastatin (ZOCOR) 40 MG tablet, Take 1 tablet (40 mg total) by mouth daily. (Patient taking differently: Take 40 mg by mouth at bedtime. ), Disp: 90 tablet, Rfl: 3 .  vitamin E 400 UNIT capsule, Take 400 Units by mouth daily.  , Disp: , Rfl:   EXAM:  VITALS per patient if applicable:  GENERAL: alert, oriented, appears well and in no acute distress  HEENT: atraumatic, conjunttiva clear, no obvious abnormalities on inspection of external nose and ears  NECK: normal movements of the head and neck  LUNGS: on inspection no signs of respiratory distress, breathing rate appears normal, no obvious gross SOB, gasping or wheezing  CV: no obvious cyanosis  MS: moves all visible extremities without noticeable abnormality  PSYCH/NEURO: pleasant and cooperative, no obvious depression or anxiety, speech and thought processing grossly intact  ASSESSMENT AND PLAN:  Discussed the following assessment and plan:  Essential hypertension Well controlled on current regimen of losartan, amlodipine and hctz. . Proper conditions for measuring reviewed.  Renal function stable, no changes today.  Family history of Alzheimer's disease Her 36 yr old sister has been diagnosed and is widowed, awaiting admission to memory care facility  Macrocytosis without anemia B12 and MMA are both normal.  Continue supplementation of B12 and folate given concurrent use of mtx and metformin   Anastomotic stricture of colorectal region S/p partial colectomy Feb 2020 with excellent results.  Moving bowels daily  Taking psyillium supplement daily  .   Diabetes mellitus without complication (HCC) Slight loss of  Control due to dietary indiscretions over the summer.   No medication changes ,  Sine dietary changes will be made.     Continue Trulicity ,  glipizide and metformin . Continue  Low GI  Diet., asa, ARB and statin/Zetia   Lab Results  Component Value Date   HGBA1C 7.2 (H) 08/08/2018  ` Lab Results  Component Value Date   MICROALBUR <0.7 08/08/2018  I discussed the assessment and treatment plan with the patient. The patient was provided an opportunity to ask questions and all were answered. The patient agreed with the plan and demonstrated an understanding of the instructions.   The patient was advised to call back or seek an in-person evaluation if the symptoms worsen or if the condition fails to improve as anticipated.  I provided  25 minutes of non-face-to-face time during this encounter.   Crecencio Mc, MD

## 2018-08-13 NOTE — Assessment & Plan Note (Signed)
Slight loss of  Control due to dietary indiscretions over the summer.   No medication changes ,  Sine dietary changes will be made.     Continue Trulicity ,  glipizide and metformin . Continue  Low GI  Diet., asa, ARB and statin/Zetia   Lab Results  Component Value Date   HGBA1C 7.2 (H) 08/08/2018  ` Lab Results  Component Value Date   MICROALBUR <0.7 08/08/2018

## 2018-08-13 NOTE — Assessment & Plan Note (Signed)
Her 74 yr old sister has been diagnosed and is widowed, awaiting admission to memory care facility

## 2018-08-13 NOTE — Assessment & Plan Note (Signed)
B12 and MMA are both normal.  Continue supplementation of B12 and folate given concurrent use of mtx and metformin

## 2018-08-13 NOTE — Assessment & Plan Note (Signed)
Well controlled on current regimen of losartan, amlodipine and hctz. . Proper conditions for measuring reviewed.  Renal function stable, no changes today.

## 2018-08-13 NOTE — Progress Notes (Signed)
Pt stated that she blood pressure has been fluctuating since her blood pressure medication was changed to losartan and amlodipine.

## 2018-08-13 NOTE — Assessment & Plan Note (Addendum)
S/p partial colectomy Feb 2020 with excellent results.  Moving bowels daily  Taking psyillium supplement daily .

## 2018-09-13 ENCOUNTER — Ambulatory Visit
Admission: RE | Admit: 2018-09-13 | Discharge: 2018-09-13 | Disposition: A | Discharge status: Home / Self Care | Payer: Medicare Other | Source: Ambulatory Visit | Attending: Internal Medicine | Admitting: Internal Medicine

## 2018-09-13 DIAGNOSIS — Z1231 Encounter for screening mammogram for malignant neoplasm of breast: Secondary | ICD-10-CM | POA: Insufficient documentation

## 2018-09-13 DIAGNOSIS — R921 Mammographic calcification found on diagnostic imaging of breast: Secondary | ICD-10-CM | POA: Insufficient documentation

## 2018-09-14 ENCOUNTER — Other Ambulatory Visit: Payer: Self-pay | Admitting: Internal Medicine

## 2018-09-14 DIAGNOSIS — R921 Mammographic calcification found on diagnostic imaging of breast: Secondary | ICD-10-CM

## 2018-09-14 DIAGNOSIS — R928 Other abnormal and inconclusive findings on diagnostic imaging of breast: Secondary | ICD-10-CM

## 2018-09-20 ENCOUNTER — Ambulatory Visit
Admission: RE | Admit: 2018-09-20 | Discharge: 2018-09-20 | Disposition: A | Discharge status: Home / Self Care | Payer: Medicare Other | Source: Ambulatory Visit | Attending: Internal Medicine | Admitting: Internal Medicine

## 2018-09-20 DIAGNOSIS — R921 Mammographic calcification found on diagnostic imaging of breast: Secondary | ICD-10-CM

## 2018-09-20 DIAGNOSIS — R928 Other abnormal and inconclusive findings on diagnostic imaging of breast: Secondary | ICD-10-CM | POA: Diagnosis not present

## 2018-09-20 DIAGNOSIS — R922 Inconclusive mammogram: Secondary | ICD-10-CM | POA: Diagnosis not present

## 2018-09-24 ENCOUNTER — Ambulatory Visit: Payer: Medicare Other

## 2018-10-02 MED ORDER — LORATADINE 10 MG PO TABS: 10.0000 mg | ORAL_TABLET | Freq: Every day | ORAL | 3 refills | Status: DC

## 2018-10-02 MED ORDER — FREESTYLE LITE TEST VI STRP: ORAL_STRIP | 3 refills | Status: DC

## 2018-10-02 MED ORDER — OMEPRAZOLE 20 MG PO CPDR: 20.0000 mg | DELAYED_RELEASE_CAPSULE | Freq: Every day | ORAL | 3 refills | Status: DC

## 2018-10-02 MED ORDER — TRULICITY 0.75 MG/0.5ML ~~LOC~~ SOAJ: 0.7500 mg | SUBCUTANEOUS | 3 refills | Status: DC

## 2018-10-02 MED ORDER — MONTELUKAST SODIUM 10 MG PO TABS: 10.0000 mg | ORAL_TABLET | Freq: Every day | ORAL | 3 refills | Status: DC

## 2018-10-02 MED ORDER — EZETIMIBE 10 MG PO TABS: 10.0000 mg | ORAL_TABLET | Freq: Every day | ORAL | 3 refills | Status: DC

## 2018-10-03 ENCOUNTER — Ambulatory Visit (INDEPENDENT_AMBULATORY_CARE_PROVIDER_SITE_OTHER): Payer: Medicare Other

## 2018-10-03 ENCOUNTER — Other Ambulatory Visit: Payer: Self-pay

## 2018-10-03 DIAGNOSIS — Z23 Encounter for immunization: Secondary | ICD-10-CM

## 2018-10-04 DIAGNOSIS — Z872 Personal history of diseases of the skin and subcutaneous tissue: Secondary | ICD-10-CM | POA: Diagnosis not present

## 2018-10-04 DIAGNOSIS — Z08 Encounter for follow-up examination after completed treatment for malignant neoplasm: Secondary | ICD-10-CM | POA: Diagnosis not present

## 2018-10-04 DIAGNOSIS — L400 Psoriasis vulgaris: Secondary | ICD-10-CM | POA: Diagnosis not present

## 2018-10-04 DIAGNOSIS — Z85828 Personal history of other malignant neoplasm of skin: Secondary | ICD-10-CM | POA: Diagnosis not present

## 2018-10-04 DIAGNOSIS — L538 Other specified erythematous conditions: Secondary | ICD-10-CM | POA: Diagnosis not present

## 2018-10-04 DIAGNOSIS — Z09 Encounter for follow-up examination after completed treatment for conditions other than malignant neoplasm: Secondary | ICD-10-CM | POA: Diagnosis not present

## 2018-10-04 DIAGNOSIS — L298 Other pruritus: Secondary | ICD-10-CM | POA: Diagnosis not present

## 2018-10-04 DIAGNOSIS — Z79899 Other long term (current) drug therapy: Secondary | ICD-10-CM | POA: Diagnosis not present

## 2018-10-04 DIAGNOSIS — L820 Inflamed seborrheic keratosis: Secondary | ICD-10-CM | POA: Diagnosis not present

## 2018-10-05 DIAGNOSIS — Z79899 Other long term (current) drug therapy: Secondary | ICD-10-CM | POA: Diagnosis not present

## 2018-10-25 ENCOUNTER — Telehealth: Payer: Self-pay | Admitting: Internal Medicine

## 2018-10-25 DIAGNOSIS — I10 Essential (primary) hypertension: Secondary | ICD-10-CM

## 2018-10-25 DIAGNOSIS — Z79899 Other long term (current) drug therapy: Secondary | ICD-10-CM

## 2018-10-25 DIAGNOSIS — E782 Mixed hyperlipidemia: Secondary | ICD-10-CM

## 2018-10-25 DIAGNOSIS — E119 Type 2 diabetes mellitus without complications: Secondary | ICD-10-CM

## 2018-10-25 NOTE — Telephone Encounter (Signed)
Pt is requesting fasting labs before her Dec. appt with Dr. Derrel Nip. Could you please put orders in, lab already scheduled.

## 2018-10-26 NOTE — Telephone Encounter (Signed)
Pt would like to have labs done prior to her CPE in December. I have ordered CBC, CMP, A1c, and Lipid panel. Is there anything else that needs to be ordered?

## 2018-11-20 MED ORDER — METFORMIN HCL ER 750 MG PO TB24: 750.0000 mg | ORAL_TABLET | Freq: Every day | ORAL | 3 refills | Status: DC

## 2018-11-26 ENCOUNTER — Telehealth: Payer: Self-pay | Admitting: Internal Medicine

## 2018-11-26 NOTE — Telephone Encounter (Signed)
Placed in red folder  

## 2018-11-26 NOTE — Telephone Encounter (Signed)
Pt dropped off [employer] Form to be filled out  Placed in Dr. Lupita Dawn color folder upfront j

## 2018-11-27 NOTE — Telephone Encounter (Signed)
Form has been faxed.

## 2018-11-27 NOTE — Telephone Encounter (Signed)
Completed.  No charge

## 2018-12-07 ENCOUNTER — Ambulatory Visit: Payer: Medicare Other | Admitting: Internal Medicine

## 2018-12-07 ENCOUNTER — Other Ambulatory Visit: Payer: Self-pay

## 2018-12-07 ENCOUNTER — Ambulatory Visit: Payer: Medicare Other

## 2018-12-10 ENCOUNTER — Other Ambulatory Visit (INDEPENDENT_AMBULATORY_CARE_PROVIDER_SITE_OTHER): Payer: Medicare Other

## 2018-12-10 ENCOUNTER — Other Ambulatory Visit: Payer: Self-pay

## 2018-12-10 DIAGNOSIS — Z79899 Other long term (current) drug therapy: Secondary | ICD-10-CM | POA: Diagnosis not present

## 2018-12-10 DIAGNOSIS — I10 Essential (primary) hypertension: Secondary | ICD-10-CM | POA: Diagnosis not present

## 2018-12-10 DIAGNOSIS — E119 Type 2 diabetes mellitus without complications: Secondary | ICD-10-CM | POA: Diagnosis not present

## 2018-12-10 DIAGNOSIS — E782 Mixed hyperlipidemia: Secondary | ICD-10-CM

## 2018-12-10 LAB — COMPREHENSIVE METABOLIC PANEL
ALT: 51 U/L — ABNORMAL HIGH (ref 0–35)
AST: 29 U/L (ref 0–37)
Albumin: 4.2 g/dL (ref 3.5–5.2)
Alkaline Phosphatase: 86 U/L (ref 39–117)
BUN: 12 mg/dL (ref 6–23)
CO2: 31 mEq/L (ref 19–32)
Calcium: 9.3 mg/dL (ref 8.4–10.5)
Chloride: 98 mEq/L (ref 96–112)
Creatinine, Ser: 0.64 mg/dL (ref 0.40–1.20)
GFR: 90.68 mL/min (ref >60.00)
Glucose, Bld: 192 mg/dL — ABNORMAL HIGH (ref 70–99)
Potassium: 4.7 mEq/L (ref 3.5–5.1)
Sodium: 138 mEq/L (ref 135–145)
Total Bilirubin: 0.6 mg/dL (ref 0.2–1.2)
Total Protein: 6.7 g/dL (ref 6.0–8.3)

## 2018-12-10 LAB — CBC WITH DIFFERENTIAL/PLATELET
Basophils Absolute: 0 10*3/uL (ref 0.0–0.1)
Basophils Relative: 0.6 % (ref 0.0–3.0)
Eosinophils Absolute: 0.1 10*3/uL (ref 0.0–0.7)
Eosinophils Relative: 1.9 % (ref 0.0–5.0)
HCT: 42.2 % (ref 36.0–46.0)
Hemoglobin: 14.2 g/dL (ref 12.0–15.0)
Lymphocytes Relative: 30.5 % (ref 12.0–46.0)
Lymphs Abs: 1.7 10*3/uL (ref 0.7–4.0)
MCHC: 33.5 g/dL (ref 30.0–36.0)
MCV: 98.9 fl (ref 78.0–100.0)
Monocytes Absolute: 0.6 10*3/uL (ref 0.1–1.0)
Monocytes Relative: 10.3 % (ref 3.0–12.0)
Neutro Abs: 3.2 10*3/uL (ref 1.4–7.7)
Neutrophils Relative %: 56.7 % (ref 43.0–77.0)
Platelets: 222 10*3/uL (ref 150.0–400.0)
RBC: 4.27 Mil/uL (ref 3.87–5.11)
RDW: 13.3 % (ref 11.5–15.5)
WBC: 5.6 10*3/uL (ref 4.0–10.5)

## 2018-12-10 LAB — LIPID PANEL
Cholesterol: 160 mg/dL (ref 0–200)
HDL: 54.3 mg/dL (ref >39.00)
LDL Cholesterol: 68 mg/dL (ref 0–99)
NonHDL: 105.81
Total CHOL/HDL Ratio: 3
Triglycerides: 187 mg/dL — ABNORMAL HIGH (ref 0.0–149.0)
VLDL: 37.4 mg/dL (ref 0.0–40.0)

## 2018-12-10 LAB — HEMOGLOBIN A1C: Hgb A1c MFr Bld: 8.1 % — ABNORMAL HIGH (ref 4.6–6.5)

## 2018-12-12 ENCOUNTER — Ambulatory Visit: Payer: Medicare Other | Admitting: Internal Medicine

## 2019-01-01 ENCOUNTER — Other Ambulatory Visit: Payer: Self-pay

## 2019-01-01 ENCOUNTER — Encounter: Payer: Self-pay | Admitting: Internal Medicine

## 2019-01-01 ENCOUNTER — Ambulatory Visit (INDEPENDENT_AMBULATORY_CARE_PROVIDER_SITE_OTHER): Payer: Medicare Other | Admitting: Internal Medicine

## 2019-01-01 VITALS — Ht 59.0 in | Wt 133.0 lb

## 2019-01-01 DIAGNOSIS — E119 Type 2 diabetes mellitus without complications: Secondary | ICD-10-CM

## 2019-01-01 DIAGNOSIS — E782 Mixed hyperlipidemia: Secondary | ICD-10-CM

## 2019-01-01 MED ORDER — TRULICITY 0.75 MG/0.5ML ~~LOC~~ SOAJ: 0.7500 mg | SUBCUTANEOUS | 3 refills | Status: DC

## 2019-01-01 NOTE — Progress Notes (Signed)
Virtual Visit converted to telephone   This visit type was conducted due to national recommendations for restrictions regarding the COVID-19 pandemic (e.g. social distancing).  This format is felt to be most appropriate for this patient at this time.  All issues noted in this document were discussed and addressed.  No physical exam was performed (except for noted visual exam findings with Video Visits).   I attempted to connect  with@ on 01/01/19 at  3:30 PM EST by a video enabled telemedicine application and verified that I am speaking with the correct person using two identifiers. Location patient: home Location provider: work or home office Persons participating in the virtual visit: patient, provider  I discussed the limitations, risks, security and privacy concerns of performing an evaluation and management service by telephone and the availability of in person appointments. I also discussed with the patient that there may be a patient responsible charge related to this service. The patient expressed understanding and agreed to proceed.  Interactive audio and video telecommunications were attempted between this provider and patient, however failed, due to patient having technical difficulties OR patient did not have access to video capability.  We continued and completed visit with audio only.   Reason for visit: follow up on diabetes and hypertension  HPI:   Follow up on uncontrolled diabetes , hypertension and hyperlipidemia    She feels generally well, but has not been  exercising for 6 weeks after husband's knee replacement .  Resumed exercise one month ago   BS have been labile from 130 to 190 fasting and < 220 post prandially.  Denies any recent hypoglyemic events.  Taking her  medications as directed:  Trulicity, glipizide once daily in the evening and  metformin     Not following a carbohydrate modified diet more than 2-3  days per week. Denies numbness, burning and tingling of  extremities. Appetite is good.   Hypertension: patient checks blood pressure twice weekly at home.  Readings have been for the most part > 140/80 at rest . Patient is following a reduce salt diet most days and is taking medications as prescribed   ROS: See pertinent positives and negatives per HPI.  Past Medical History:  Diagnosis Date  . Complicated pregnancy    1st pregnancy complicated by post operative hemorrhage and 2ng complicated by epidural  . Diabetes mellitus   . Diverticulosis of colon (without mention of hemorrhage)   . Fracture of malleolus of right ankle, closed, with routine healing, subsequent encounter 12/09/2017  . GERD (gastroesophageal reflux disease)   . Guttate psoriasis   . Hyperlipidemia   . Hypertension   . Menopausal disorder   . Rectal stricture   . Skin cancer     Past Surgical History:  Procedure Laterality Date  . ABDOMINAL HYSTERECTOMY    . APPENDECTOMY  Dec 2012  . BLADDER REPAIR  1991   lift   . COLON RESECTION SIGMOID N/A 02/19/2018   Procedure: LAPAROSCOPIC SIGMOID COLECTOMY- POSSIBLE OPEN;  Surgeon: Olean Ree, MD;  Location: [facility] ORS;  Service: General;  Laterality: N/A;  . COLONOSCOPY WITH PROPOFOL N/A 07/17/2017   Procedure: COLONOSCOPY WITH PROPOFOL;  Surgeon: Lin Landsman, MD;  Location: [facility] ENDOSCOPY;  Service: Gastroenterology;  Laterality: N/A;  . COLONOSCOPY WITH PROPOFOL N/A 11/02/2017   Procedure: COLONOSCOPY WITH PROPOFOL;  Surgeon: Lin Landsman, MD;  Location: [facility] ENDOSCOPY;  Service: Gastroenterology;  Laterality: N/A;  . CYSTOCELE REPAIR    . [place]  Bilateral 02/19/2018   Procedure: CYSTOSCOPY WITH STENT PLACEMENT GOING 1ST;  Surgeon: Billey Co, MD;  Location: [facility] ORS;  Service: Urology;  Laterality: Bilateral;  . FLEXIBLE SIGMOIDOSCOPY N/A 07/31/2017   Procedure: FLEXIBLE SIGMOIDOSCOPY;  Surgeon: Lin Landsman, MD;  Location: [facility] ENDOSCOPY;  Service:  Gastroenterology;  Laterality: N/A;  . HERNIA REPAIR    . MOHS SURGERY N/A 05/2015   Face  . PARTIAL COLECTOMY  Nov 2012   left, secondary to diverticular perf  . RECTOCELE REPAIR    . SEPTOPLASTY  1969  . TEE WITHOUT CARDIOVERSION N/A 11/24/2015   Procedure: TRANSESOPHAGEAL ECHOCARDIOGRAM (TEE);  Surgeon: Minna Merritts, MD;  Location: [facility] ORS;  Service: Cardiovascular;  Laterality: N/A;  . TONSILLECTOMY  1953  . VARICOSE VEIN SURGERY  1974   right leg     Family History  Problem Relation Age of Onset  . Heart Problems Mother   . Macular degeneration Mother   . Cancer Father   . Cancer Maternal Grandmother        colon ca  . Diabetes Sister   . Dementia Sister   . Diabetes Brother   . Stroke Brother   . Diabetes Brother   . Breast cancer Maternal Aunt   . Breast cancer Paternal Aunt   . Lung cancer Cousin   . Lung disease Neg Hx   . Rheumatologic disease Neg Hx     SOCIAL HX:  reports that she has never smoked. She has never used smokeless tobacco. She reports current alcohol use. She reports that she does not use drugs.   Current Outpatient Medications:  .  amLODipine (NORVASC) 2.5 MG tablet, Take 1 tablet (2.5 mg total) by mouth daily., Disp: 90 tablet, Rfl: 3 .  Ascorbic Acid (VITAMIN C) 1000 MG tablet, Take 1,000 mg by mouth daily. , Disp: , Rfl:  .  aspirin 81 MG chewable tablet, Chew 81 mg by mouth daily. , Disp: , Rfl:  .  Calcium Carb-Cholecalciferol 600-100 MG-UNIT CAPS, Take 1 tablet by mouth daily. Two by mouth daily (Patient taking differently: Take 1 tablet by mouth daily. ), Disp: 90 capsule, Rfl: 3 .  escitalopram (LEXAPRO) 5 MG tablet, Take 1 tablet (5 mg total) by mouth every evening., Disp: 90 tablet, Rfl: 3 .  ezetimibe (ZETIA) 10 MG tablet, Take 1 tablet (10 mg total) by mouth daily., Disp: 90 tablet, Rfl: 3 .  folic acid (FOLVITE) 1 MG tablet, Take 1 mg by mouth See admin instructions. Takes daily except on Saturday, Disp: , Rfl:  .  glipiZIDE  (GLUCOTROL) 5 MG tablet, Take 1 tablet (5 mg total) by mouth 2 (two) times daily before a meal., Disp: 180 tablet, Rfl: 3 .  glucose blood (FREESTYLE LITE) test strip, Use as instructed to test blood sugar twice daily, Disp: 200 each, Rfl: 3 .  hydrochlorothiazide (HYDRODIURIL) 12.5 MG tablet, Take 1 tablet (12.5 mg total) by mouth daily., Disp: 90 tablet, Rfl: 3 .  loratadine (CLARITIN) 10 MG tablet, Take 1 tablet (10 mg total) by mouth daily., Disp: 90 tablet, Rfl: 3 .  losartan (COZAAR) 100 MG tablet, Take 1 tablet (100 mg total) by mouth daily., Disp: 90 tablet, Rfl: 3 .  metFORMIN (GLUCOPHAGE XR) 750 MG 24 hr tablet, Take 1 tablet (750 mg total) by mouth daily with breakfast., Disp: 90 tablet, Rfl: 3 .  methotrexate (RHEUMATREX) 2.5 MG tablet, Take 7.5 mg by mouth every Saturday. , Disp: , Rfl:  .  montelukast (SINGULAIR)  10 MG tablet, Take 1 tablet (10 mg total) by mouth at bedtime., Disp: 90 tablet, Rfl: 3 .  Multiple Vitamin (MULTIVITAMIN) capsule, Take 1 capsule by mouth daily.  , Disp: , Rfl:  .  Omega-3 Fatty Acids (FISH OIL) 1000 MG CAPS, Take 1 capsule by mouth daily., Disp: , Rfl:  .  omeprazole (PRILOSEC) 20 MG capsule, Take 1 capsule (20 mg total) by mouth daily., Disp: 90 capsule, Rfl: 3 .  Precision Thin Lancets MISC, Use as directed to check sugars twice daily, Disp: 180 each, Rfl: 3 .  simvastatin (ZOCOR) 40 MG tablet, Take 1 tablet (40 mg total) by mouth daily. (Patient taking differently: Take 40 mg by mouth at bedtime. ), Disp: 90 tablet, Rfl: 3 .  vitamin E 400 UNIT capsule, Take 400 Units by mouth daily.  , Disp: , Rfl:  .  Dulaglutide (TRULICITY) 1.5 0000000 SOPN, Inject 1.5 mg into the skin once a week., Disp: 12 pen, Rfl: 1  EXAM:  VITALS per patient if applicable:  GENERAL: alert, oriented, appears well and in no acute distress  HEENT: atraumatic, conjunttiva clear, no obvious abnormalities on inspection of external nose and ears  NECK: normal movements of the  head and neck  LUNGS: on inspection no signs of respiratory distress, breathing rate appears normal, no obvious gross SOB, gasping or wheezing  CV: no obvious cyanosis  MS: moves all visible extremities without noticeable abnormality  PSYCH/NEURO: pleasant and cooperative, no obvious depression or anxiety, speech and thought processing grossly intact  ASSESSMENT AND PLAN:  Discussed the following assessment and plan:  Diabetes mellitus without complication (HCC) - Plan: Hemoglobin A1c, Comprehensive metabolic panel, Lipid panel  Mixed hyperlipidemia  Diabetes mellitus without complication (HCC) Loss of control noted.  Will increase Trulicity dose to 1.5 mg , recommend resuming low GI diet  And increased participation in regular exercise.   Hyperlipidemia LDL is < 100 and triglycerides are at goal on simvastatin/zetia . She has no side effects and ALT was mildly elevated. No changes today. Continue aspirin low dose.  Lab Results  Component Value Date   CHOL 160 12/10/2018   HDL 54.30 12/10/2018   LDLCALC 68 12/10/2018   LDLDIRECT 72.0 04/12/2018   TRIG 187.0 (H) 12/10/2018   CHOLHDL 3 12/10/2018   Lab Results  Component Value Date   ALT 51 (H) 12/10/2018   AST 29 12/10/2018   ALKPHOS 86 12/10/2018   BILITOT 0.6 12/10/2018        I discussed the assessment and treatment plan with the patient. The patient was provided an opportunity to ask questions and all were answered. The patient agreed with the plan and demonstrated an understanding of the instructions.   The patient was advised to call back or seek an in-person evaluation if the symptoms worsen or if the condition fails to improve as anticipated.   I provided  25 minutes of non-face-to-face time during this encounter reviewing patient's current problems and past procedures/imaging studies, providing counseling on the above mentioned problems , and coordination  of care .

## 2019-01-02 ENCOUNTER — Telehealth: Payer: Self-pay | Admitting: Internal Medicine

## 2019-01-02 MED ORDER — TRULICITY 1.5 MG/0.5ML ~~LOC~~ SOAJ: 1.5000 mg | SUBCUTANEOUS | 1 refills | Status: DC

## 2019-01-02 NOTE — Telephone Encounter (Signed)
Lm on vm to call office to set up follow up and fasting lab, around 03/10/2019

## 2019-01-02 NOTE — Assessment & Plan Note (Signed)
LDL is < 100 and triglycerides are at goal on simvastatin/zetia . She has no side effects and ALT was mildly elevated. No changes today. Continue aspirin low dose.  Lab Results  Component Value Date   CHOL 160 12/10/2018   HDL 54.30 12/10/2018   LDLCALC 68 12/10/2018   LDLDIRECT 72.0 04/12/2018   TRIG 187.0 (H) 12/10/2018   CHOLHDL 3 12/10/2018   Lab Results  Component Value Date   ALT 51 (H) 12/10/2018   AST 29 12/10/2018   ALKPHOS 86 12/10/2018   BILITOT 0.6 12/10/2018

## 2019-01-02 NOTE — Assessment & Plan Note (Addendum)
Loss of control noted.  Will increase Trulicity dose to 1.5 mg , recommend resuming low GI diet  And increased participation in regular exercise.

## 2019-01-08 ENCOUNTER — Encounter: Payer: Self-pay | Admitting: Gastroenterology

## 2019-01-08 ENCOUNTER — Other Ambulatory Visit: Payer: Self-pay

## 2019-01-08 ENCOUNTER — Ambulatory Visit (INDEPENDENT_AMBULATORY_CARE_PROVIDER_SITE_OTHER): Payer: Medicare Other | Admitting: Gastroenterology

## 2019-01-08 VITALS — BP 153/80 | HR 87 | Temp 98.3°F | Resp 17 | Ht 59.0 in | Wt 137.6 lb

## 2019-01-08 DIAGNOSIS — R7989 Other specified abnormal findings of blood chemistry: Secondary | ICD-10-CM

## 2019-01-08 DIAGNOSIS — K760 Fatty (change of) liver, not elsewhere classified: Secondary | ICD-10-CM

## 2019-01-08 NOTE — Progress Notes (Signed)
[name], MD [address]  [place]  [place], [place] [address]  Main: [phone]  Fax: [phone]    Gastroenterology Consultation  Referring Provider:     Crecencio Mc, MD Primary Care Physician:  Crecencio Mc, MD Primary Gastroenterologist:  Dr. [name] Reason for Consultation:     Chronic methotrexate use        HPI:   Emily Ryan is a 74 y.o. pleasant Caucasian female referred by Dr. Crecencio Mc, MD  for consultation & management of chronic methotrexate use. She has history of psoriasis and has been on methotrexate for last 6 years or so. She is referred to me for risk stratification because of total cumulative dose of methotrexate >3gm since initiation of medication. She reports that her psoriasis is completely under control on methotrexate 7.5 mg weekly. Her dermatologist tried to decrease the dose to 5 mg weekly and symptoms recurred. She takes daily folate acid. She does not have any known liver disease. Her most recent LFTs have been normal. Her head CT antibody negative. Unknown hepatitis B status. She does have diabetes, last A1c 7. She reports that she likes baking and consumes a lot of white bread. It's hard for her to lose weight. She denies any GI symptoms today. She denies blood transfusions in the past  She had a colonic perforation underwent emergent surgery in 2012, as well as bowel resection. She was told that she may had perforated diverticulitis and she had to bowel prep that resulted in perforation. She said that they attempted colonoscopy in 2015 and was unsuccessful due to tight stricture in the left colon, therefore underwent a virtual CT colonoscopy which revealed a rectosigmoid stricture and inflammation. She had normal colonoscopy in 2005  Follow-up visit 01/08/2019 Patient underwent resection of the sigmoid stricture with primary anastomosis by Dr. Hampton Abbot in 02/2018 and recovered well.  She did notice significant improvement  in her bowel habits.  She is no longer spending a lot of time on toilet.  Her bowels are regular not associated with straining.  She gained few pounds since surgery and she was taking care of her husband and stopped exercising due to pandemic.  Her hemoglobin A1c has gotten worse.  She is currently on methotrexate 7.5 mg weekly for psoriasis.  Most recent LFTs revealed mildly elevated ALT on 12/2018 She does not have any GI complaints today  NSAIDs: none  Antiplts/Anticoagulants/Anti thrombotics: none  GI Procedures:  Colonoscopy normal in 2005 CT virtual colonography in 2015 with no polyps detected  Past Medical History:  Diagnosis Date  . Complicated pregnancy    1st pregnancy complicated by post operative hemorrhage and 2ng complicated by epidural  . Diabetes mellitus   . Diverticulosis of colon (without mention of hemorrhage)   . Fracture of malleolus of right ankle, closed, with routine healing, subsequent encounter 12/09/2017  . GERD (gastroesophageal reflux disease)   . Guttate psoriasis   . Hyperlipidemia   . Hypertension   . Menopausal disorder   . Rectal stricture   . Skin cancer     Past Surgical History:  Procedure Laterality Date  . ABDOMINAL HYSTERECTOMY    . APPENDECTOMY  Dec 2012  . BLADDER REPAIR  1991   lift   . COLON RESECTION SIGMOID N/A 02/19/2018   Procedure: LAPAROSCOPIC SIGMOID COLECTOMY- POSSIBLE OPEN;  Surgeon: Olean Ree, MD;  Location: [facility] ORS;  Service: General;  Laterality: N/A;  . COLONOSCOPY WITH PROPOFOL N/A 07/17/2017  Procedure: COLONOSCOPY WITH PROPOFOL;  Surgeon: Lin Landsman, MD;  Location: [facility] ENDOSCOPY;  Service: Gastroenterology;  Laterality: N/A;  . COLONOSCOPY WITH PROPOFOL N/A 11/02/2017   Procedure: COLONOSCOPY WITH PROPOFOL;  Surgeon: Lin Landsman, MD;  Location: [facility] ENDOSCOPY;  Service: Gastroenterology;  Laterality: N/A;  . CYSTOCELE REPAIR    . CYSTOSCOPY W/ URETERAL STENT PLACEMENT Bilateral 02/19/2018    Procedure: CYSTOSCOPY WITH STENT PLACEMENT GOING 1ST;  Surgeon: Billey Co, MD;  Location: [facility] ORS;  Service: Urology;  Laterality: Bilateral;  . FLEXIBLE SIGMOIDOSCOPY N/A 07/31/2017   Procedure: FLEXIBLE SIGMOIDOSCOPY;  Surgeon: Lin Landsman, MD;  Location: [facility] ENDOSCOPY;  Service: Gastroenterology;  Laterality: N/A;  . HERNIA REPAIR    . MOHS SURGERY N/A 05/2015   Face  . PARTIAL COLECTOMY  Nov 2012   left, secondary to diverticular perf  . RECTOCELE REPAIR    . SEPTOPLASTY  1969  . TEE WITHOUT CARDIOVERSION N/A 11/24/2015   Procedure: TRANSESOPHAGEAL ECHOCARDIOGRAM (TEE);  Surgeon: Minna Merritts, MD;  Location: [facility] ORS;  Service: Cardiovascular;  Laterality: N/A;  . TONSILLECTOMY  1953  . VARICOSE VEIN SURGERY  1974   right leg     Current Outpatient Medications:  .  amLODipine (NORVASC) 2.5 MG tablet, Take 1 tablet (2.5 mg total) by mouth daily., Disp: 90 tablet, Rfl: 3 .  Ascorbic Acid (VITAMIN C) 1000 MG tablet, Take 1,000 mg by mouth daily. , Disp: , Rfl:  .  aspirin 81 MG chewable tablet, Chew 81 mg by mouth daily. , Disp: , Rfl:  .  Calcium Carb-Cholecalciferol 600-100 MG-UNIT CAPS, Take 1 tablet by mouth daily. Two by mouth daily (Patient taking differently: Take 1 tablet by mouth daily. ), Disp: 90 capsule, Rfl: 3 .  Dulaglutide (TRULICITY) 1.5 0000000 SOPN, Inject 1.5 mg into the skin once a week., Disp: 12 pen, Rfl: 1 .  escitalopram (LEXAPRO) 5 MG tablet, Take 1 tablet (5 mg total) by mouth every evening., Disp: 90 tablet, Rfl: 3 .  ezetimibe (ZETIA) 10 MG tablet, Take 1 tablet (10 mg total) by mouth daily., Disp: 90 tablet, Rfl: 3 .  folic acid (FOLVITE) 1 MG tablet, Take 1 mg by mouth See admin instructions. Takes daily except on Saturday, Disp: , Rfl:  .  glipiZIDE (GLUCOTROL) 5 MG tablet, Take 1 tablet (5 mg total) by mouth 2 (two) times daily before a meal., Disp: 180 tablet, Rfl: 3 .  glucose blood (FREESTYLE LITE) test strip, Use as instructed to  test blood sugar twice daily, Disp: 200 each, Rfl: 3 .  hydrochlorothiazide (HYDRODIURIL) 12.5 MG tablet, Take 1 tablet (12.5 mg total) by mouth daily., Disp: 90 tablet, Rfl: 3 .  loratadine (CLARITIN) 10 MG tablet, Take 1 tablet (10 mg total) by mouth daily., Disp: 90 tablet, Rfl: 3 .  losartan (COZAAR) 100 MG tablet, Take 1 tablet (100 mg total) by mouth daily., Disp: 90 tablet, Rfl: 3 .  metFORMIN (GLUCOPHAGE XR) 750 MG 24 hr tablet, Take 1 tablet (750 mg total) by mouth daily with breakfast., Disp: 90 tablet, Rfl: 3 .  methotrexate (RHEUMATREX) 2.5 MG tablet, Take 7.5 mg by mouth every Saturday. , Disp: , Rfl:  .  montelukast (SINGULAIR) 10 MG tablet, Take 1 tablet (10 mg total) by mouth at bedtime., Disp: 90 tablet, Rfl: 3 .  Multiple Vitamin (MULTIVITAMIN) capsule, Take 1 capsule by mouth daily.  , Disp: , Rfl:  .  Omega-3 Fatty Acids (FISH OIL) 1000 MG CAPS, Take 1  capsule by mouth daily., Disp: , Rfl:  .  omeprazole (PRILOSEC) 20 MG capsule, Take 1 capsule (20 mg total) by mouth daily., Disp: 90 capsule, Rfl: 3 .  Precision Thin Lancets MISC, Use as directed to check sugars twice daily, Disp: 180 each, Rfl: 3 .  simvastatin (ZOCOR) 40 MG tablet, Take 1 tablet (40 mg total) by mouth daily. (Patient taking differently: Take 40 mg by mouth at bedtime. ), Disp: 90 tablet, Rfl: 3 .  vitamin E 400 UNIT capsule, Take 400 Units by mouth daily.  , Disp: , Rfl:     Family History  Problem Relation Age of Onset  . Heart Problems Mother   . Macular degeneration Mother   . Cancer Father   . Cancer Maternal Grandmother        colon ca  . Diabetes Sister   . Dementia Sister   . Diabetes Brother   . Stroke Brother   . Diabetes Brother   . Breast cancer Maternal Aunt   . Breast cancer Paternal Aunt   . Lung cancer Cousin   . Lung disease Neg Hx   . Rheumatologic disease Neg Hx      Social History   Tobacco Use  . Smoking status: Never Smoker  . Smokeless tobacco: Never Used  . Tobacco  comment: father growing up  Substance Use Topics  . Alcohol use: Yes    Comment: Occasional  . Drug use: No    Allergies as of 01/08/2019  . (No Known Allergies)    Review of Systems:    All systems reviewed and negative except where noted in HPI.   Physical Exam:  BP (!) 153/80 (BP Location: Left Arm, Patient Position: Sitting, Cuff Size: Normal)   Pulse 87   Temp 98.3 F (36.8 C)   Resp 17   Ht 4\' 11"  (1.499 m)   Wt 137 lb 9.6 oz (62.4 kg)   BMI 27.79 kg/m  No LMP recorded. Patient has had a hysterectomy.  General:   Alert,  Well-developed, well-nourished, pleasant and cooperative in NAD Head:  Normocephalic and atraumatic. Eyes:  Sclera clear, no icterus.   Conjunctiva pink. Ears:  Normal auditory acuity. Neck:  Supple; no masses or thyromegaly. Lungs:  Respirations even and unlabored.  Clear throughout to auscultation.   No wheezes, crackles, or rhonchi. No acute distress. Heart:  Regular rate and rhythm; no murmurs, clicks, rubs, or gallops. Abdomen:  Normal bowel sounds. Soft, non-tender and non-distended without masses, hepatosplenomegaly or hernias noted.  No guarding or rebound tenderness.   Rectal: Not performed Msk:  Symmetrical without gross deformities. Good, equal movement & strength bilaterally. Pulses:  Normal pulses noted. Extremities:  No clubbing or edema.  No cyanosis. Neurologic:  Alert and oriented x3;  grossly normal neurologically. Skin:  Intact without significant lesions or rashes. No jaundice. Psych:  Alert and cooperative. Normal mood and affect.  Imaging Studies: reviewed  Assessment and Plan:   Emily Ryan is a 74 y.o. Caucasian female with history of psoriasis, on methotrexate 7.5 mg weekly, in remission, history of perforated diverticulitis, resulting in partial colectomy, rectosigmoid stricture s/p resection with primary anastomosis in 02/2018 is seen for follow-up of fatty liver and elevated LFTs and monitoring of liver on  methotrexate use.  Generally, there is a risk for progression to fibrosis or cirrhosis when total cumulative methotrexate dose exceeds 1.5 to 2gm. Patient has history of diabetes, hyperlipidemia, overweight which can increase her risk of progression from fatty liver  to fibrosis.  Previous ultrasound liver with elastography revealed minimal fibrosis along with fatty liver. Hepatitis B surface antigen, surface antibody, and core antibody total negative, Quantiferon gold negative  -Repeat ultrasound liver with elastography -Recommend NASH fibrosis panel -Continue methotrexate 7.5 mg weekly along with daily folate for now as her psoriasis is completely under remission -strongly advised for tight control of diabetes, hyperlipidemia to prevent progression of fatty liver  History of colon adenomas Recommend surveillance colonoscopy in 10/2020   Follow up in 3 months  [name], MD

## 2019-01-10 LAB — NASH FIBROSURE
ALPHA 2-MACROGLOBULINS, QN: 174 mg/dL (ref 110–276)
ALT (SGPT) P5P: 79 IU/L — ABNORMAL HIGH (ref 0–40)
AST (SGOT) P5P: 44 IU/L — ABNORMAL HIGH (ref 0–40)
Apolipoprotein A-1: 188 mg/dL (ref 116–209)
Bilirubin, Total: 0.3 mg/dL (ref 0.0–1.2)
Cholesterol, Total: 155 mg/dL (ref 100–199)
Fibrosis Score: 0.17 (ref 0.00–0.21)
GGT: 83 IU/L — ABNORMAL HIGH (ref 0–60)
Glucose: 291 mg/dL — ABNORMAL HIGH (ref 65–99)
Haptoglobin: 137 mg/dL (ref 42–346)
Height: 59 in
NASH Score: 0.75 — ABNORMAL HIGH
Steatosis Score: 0.86 — ABNORMAL HIGH (ref 0.00–0.30)
Triglycerides: 218 mg/dL — ABNORMAL HIGH (ref 0–149)
Weight: 137 [lb_av]

## 2019-01-10 MED ORDER — HYDROCHLOROTHIAZIDE 12.5 MG PO TABS: 12.5000 mg | ORAL_TABLET | Freq: Every day | ORAL | 3 refills | Status: DC

## 2019-01-10 MED ORDER — SIMVASTATIN 40 MG PO TABS: 40.0000 mg | ORAL_TABLET | Freq: Every day | ORAL | 3 refills | Status: DC

## 2019-01-11 ENCOUNTER — Other Ambulatory Visit: Payer: Self-pay | Admitting: Internal Medicine

## 2019-01-11 MED ORDER — SIMVASTATIN 20 MG PO TABS: 20.0000 mg | ORAL_TABLET | Freq: Every day | ORAL | 3 refills | Status: DC

## 2019-01-14 ENCOUNTER — Other Ambulatory Visit: Payer: Self-pay

## 2019-01-14 ENCOUNTER — Ambulatory Visit: Admission: RE | Admit: 2019-01-14 | Payer: Medicare Other | Source: Ambulatory Visit

## 2019-01-17 ENCOUNTER — Other Ambulatory Visit: Payer: Self-pay

## 2019-01-17 ENCOUNTER — Ambulatory Visit
Admission: RE | Admit: 2019-01-17 | Discharge: 2019-01-17 | Disposition: A | Discharge status: Home / Self Care | Payer: Medicare Other | Source: Ambulatory Visit | Attending: Gastroenterology | Admitting: Gastroenterology

## 2019-01-17 DIAGNOSIS — K760 Fatty (change of) liver, not elsewhere classified: Secondary | ICD-10-CM | POA: Diagnosis not present

## 2019-04-09 ENCOUNTER — Telehealth: Payer: Self-pay | Admitting: Internal Medicine

## 2019-04-09 ENCOUNTER — Other Ambulatory Visit: Payer: Self-pay

## 2019-04-09 ENCOUNTER — Other Ambulatory Visit (INDEPENDENT_AMBULATORY_CARE_PROVIDER_SITE_OTHER): Payer: Medicare Other

## 2019-04-09 DIAGNOSIS — E119 Type 2 diabetes mellitus without complications: Secondary | ICD-10-CM | POA: Diagnosis not present

## 2019-04-09 LAB — COMPREHENSIVE METABOLIC PANEL
ALT: 33 U/L (ref 0–35)
AST: 28 U/L (ref 0–37)
Albumin: 4.3 g/dL (ref 3.5–5.2)
Alkaline Phosphatase: 75 U/L (ref 39–117)
BUN: 13 mg/dL (ref 6–23)
CO2: 30 mEq/L (ref 19–32)
Calcium: 9 mg/dL (ref 8.4–10.5)
Chloride: 100 mEq/L (ref 96–112)
Creatinine, Ser: 0.71 mg/dL (ref 0.40–1.20)
GFR: 80.37 mL/min (ref >60.00)
Glucose, Bld: 134 mg/dL — ABNORMAL HIGH (ref 70–99)
Potassium: 3.9 mEq/L (ref 3.5–5.1)
Sodium: 138 mEq/L (ref 135–145)
Total Bilirubin: 0.8 mg/dL (ref 0.2–1.2)
Total Protein: 7.1 g/dL (ref 6.0–8.3)

## 2019-04-09 LAB — LIPID PANEL
Cholesterol: 158 mg/dL (ref 0–200)
HDL: 56.7 mg/dL (ref >39.00)
LDL Cholesterol: 78 mg/dL (ref 0–99)
NonHDL: 101.32
Total CHOL/HDL Ratio: 3
Triglycerides: 119 mg/dL (ref 0.0–149.0)
VLDL: 23.8 mg/dL (ref 0.0–40.0)

## 2019-04-09 LAB — HEMOGLOBIN A1C: Hgb A1c MFr Bld: 7 % — ABNORMAL HIGH (ref 4.6–6.5)

## 2019-04-09 NOTE — Telephone Encounter (Signed)
Yes she can come in

## 2019-04-09 NOTE — Telephone Encounter (Signed)
Patient went to [place] last week. No symptoms, she stated she had both covid vaccines. Can she come into office for Thursday appt?

## 2019-04-11 ENCOUNTER — Telehealth: Payer: Self-pay | Admitting: Internal Medicine

## 2019-04-11 ENCOUNTER — Other Ambulatory Visit: Payer: Self-pay

## 2019-04-11 ENCOUNTER — Ambulatory Visit (INDEPENDENT_AMBULATORY_CARE_PROVIDER_SITE_OTHER): Payer: Medicare Other | Admitting: Internal Medicine

## 2019-04-11 ENCOUNTER — Encounter: Payer: Self-pay | Admitting: Internal Medicine

## 2019-04-11 VITALS — BP 122/74 | HR 84 | Temp 97.2°F | Resp 14 | Ht 59.0 in | Wt 132.2 lb

## 2019-04-11 DIAGNOSIS — H9193 Unspecified hearing loss, bilateral: Secondary | ICD-10-CM | POA: Diagnosis not present

## 2019-04-11 DIAGNOSIS — E119 Type 2 diabetes mellitus without complications: Secondary | ICD-10-CM | POA: Diagnosis not present

## 2019-04-11 DIAGNOSIS — R21 Rash and other nonspecific skin eruption: Secondary | ICD-10-CM

## 2019-04-11 DIAGNOSIS — I10 Essential (primary) hypertension: Secondary | ICD-10-CM

## 2019-04-11 DIAGNOSIS — E782 Mixed hyperlipidemia: Secondary | ICD-10-CM

## 2019-04-11 MED ORDER — FOLIC ACID 1 MG PO TABS: 1.0000 mg | ORAL_TABLET | ORAL | 3 refills | Status: DC

## 2019-04-11 MED ORDER — LOSARTAN POTASSIUM 100 MG PO TABS: 100.0000 mg | ORAL_TABLET | Freq: Every day | ORAL | 3 refills | Status: DC

## 2019-04-11 MED ORDER — ESCITALOPRAM OXALATE 5 MG PO TABS: 5.0000 mg | ORAL_TABLET | Freq: Every evening | ORAL | 3 refills | Status: DC

## 2019-04-11 NOTE — Telephone Encounter (Signed)
Pt would like to have labs done prior to her appt in October. I have ordered a1c, cmp, lipid, and microalbumin. Is there anything else that needs to be ordered?

## 2019-04-11 NOTE — Telephone Encounter (Signed)
Pt would like to have labs done before her appt in October  No labs in the system

## 2019-04-11 NOTE — Progress Notes (Signed)
Subjective:  Patient ID: Emily Ryan, female    DOB: [birth year]  Age: 74 y.o. MRN: [id number]  CC: The primary encounter diagnosis was Perineal rash in female. Diagnoses of Bilateral hearing loss, unspecified hearing loss type, Essential hypertension, Mixed hyperlipidemia, and Diabetes mellitus without complication ([place]) were also pertinent to this visit.  HPI [name] presents for follow up on diabetes  This visit occurred during the SARS-CoV-2 public health emergency.  Safety protocols were in place, including screening questions prior to the visit, additional usage of staff PPE, and extensive cleaning of exam room while observing appropriate contact time as indicated for disinfecting solutions.    Patient has received both doses of the available COVID 19 vaccine without complications.  Patient continues to mask when outside of the home except when walking in yard or at safe distances from others .  Patient denies any change in mood or development of unhealthy behaviors resuting from the pandemic's restriction of activities and socialization.     T2DM: loss of control noted in December   With a1c rising to 8.1  .  She has been taking metformin and Trulicity since December.  Hepatic steatosis:   Liver elastograph done in January,  No fibrosis seen   Hearing loss : gradual,  Wants to have formal testiing   Perineal irritation , chronic ,thought it was due to psoriasis , becomes  Painful with intercourse,  Urination . .  present for years,    Lab Results  Component Value Date   HGBA1C 7.0 (H) 04/09/2019     Outpatient Medications Prior to Visit  Medication Sig Dispense Refill  . amLODipine (NORVASC) 2.5 MG tablet Take 1 tablet (2.5 mg total) by mouth daily. 90 tablet 3  . Ascorbic Acid (VITAMIN C) 1000 MG tablet Take 1,000 mg by mouth daily.     Marland Kitchen aspirin 81 MG chewable tablet Chew 81 mg by mouth daily.     . Calcium Carb-Cholecalciferol 600-100 MG-UNIT CAPS Take 1  tablet by mouth daily. Two by mouth daily (Patient taking differently: Take 1 tablet by mouth daily. ) 90 capsule 3  . Dulaglutide (TRULICITY) 1.5 0000000 SOPN Inject 1.5 mg into the skin once a week. 12 pen 1  . ezetimibe (ZETIA) 10 MG tablet Take 1 tablet (10 mg total) by mouth daily. 90 tablet 3  . glipiZIDE (GLUCOTROL) 5 MG tablet Take 1 tablet (5 mg total) by mouth 2 (two) times daily before a meal. 180 tablet 3  . glucose blood (FREESTYLE LITE) test strip Use as instructed to test blood sugar twice daily 200 each 3  . hydrochlorothiazide (HYDRODIURIL) 12.5 MG tablet Take 1 tablet (12.5 mg total) by mouth daily. 90 tablet 3  . loratadine (CLARITIN) 10 MG tablet Take 1 tablet (10 mg total) by mouth daily. 90 tablet 3  . metFORMIN (GLUCOPHAGE XR) 750 MG 24 hr tablet Take 1 tablet (750 mg total) by mouth daily with breakfast. 90 tablet 3  . methotrexate (RHEUMATREX) 2.5 MG tablet Take 7.5 mg by mouth every Saturday.     . montelukast (SINGULAIR) 10 MG tablet Take 1 tablet (10 mg total) by mouth at bedtime. 90 tablet 3  . Multiple Vitamin (MULTIVITAMIN) capsule Take 1 capsule by mouth daily.      . Omega-3 Fatty Acids (FISH OIL) 1000 MG CAPS Take 1 capsule by mouth daily.    Marland Kitchen omeprazole (PRILOSEC) 20 MG capsule Take 1 capsule (20 mg total) by mouth daily. 90 capsule 3  .  Precision Thin Lancets MISC Use as directed to check sugars twice daily 180 each 3  . simvastatin (ZOCOR) 20 MG tablet Take 1 tablet (20 mg total) by mouth at bedtime. 90 tablet 3  . vitamin E 400 UNIT capsule Take 400 Units by mouth daily.      Marland Kitchen escitalopram (LEXAPRO) 5 MG tablet Take 1 tablet (5 mg total) by mouth every evening. 90 tablet 3  . folic acid (FOLVITE) 1 MG tablet Take 1 mg by mouth See admin instructions. Takes daily except on Saturday    . losartan (COZAAR) 100 MG tablet Take 1 tablet (100 mg total) by mouth daily. 90 tablet 3   No facility-administered medications prior to visit.    Review of  Systems;  Patient denies headache, fevers, malaise, unintentional weight loss, skin rash, eye pain, sinus congestion and sinus pain, sore throat, dysphagia,  hemoptysis , cough, dyspnea, wheezing, chest pain, palpitations, orthopnea, edema, abdominal pain, nausea, melena, diarrhea, constipation, flank pain, dysuria, hematuria, urinary  Frequency, nocturia, numbness, tingling, seizures,  Focal weakness, Loss of consciousness,  Tremor, insomnia, depression, anxiety, and suicidal ideation.      Objective:  BP 122/74 (BP Location: Left Arm, Patient Position: Sitting, Cuff Size: Normal)   Pulse 84   Temp (!) 97.2 F (36.2 C) (Temporal)   Resp 14   Ht 4\' 11"  (1.499 m)   Wt 132 lb 3.2 oz (60 kg)   SpO2 96%   BMI 26.70 kg/m   BP Readings from Last 3 Encounters:  04/11/19 122/74  01/08/19 (!) 153/80  08/13/18 (!) 144/78    Wt Readings from Last 3 Encounters:  04/11/19 132 lb 3.2 oz (60 kg)  01/08/19 137 lb 9.6 oz (62.4 kg)  01/01/19 133 lb (60.3 kg)    General appearance: alert, cooperative and appears stated age Ears: normal TM's and external ear canals both ears Throat: lips, mucosa, and tongue normal; teeth and gums normal Neck: no adenopathy, no carotid bruit, supple, symmetrical, trachea midline and thyroid not enlarged, symmetric, no tenderness/mass/nodules Back: symmetric, no curvature. ROM normal. No CVA tenderness. Lungs: clear to auscultation bilaterally Heart: regular rate and rhythm, S1, S2 normal, no murmur, click, rub or gallop Abdomen: soft, non-tender; bowel sounds normal; no masses,  no organomegaly Pulses: 2+ and symmetric Pelvic:  Perineal skin has hyperpigmented macular rash that is tender /sensitive to palpation Skin: Skin color, texture, turgor normal. No rashes or lesions Lymph nodes: Cervical, supraclavicular, and axillary nodes normal.  Lab Results  Component Value Date   HGBA1C 7.0 (H) 04/09/2019   HGBA1C 8.1 (H) 12/10/2018   HGBA1C 7.2 (H) 08/08/2018     Lab Results  Component Value Date   CREATININE 0.71 04/09/2019   CREATININE 0.64 12/10/2018   CREATININE 0.61 08/08/2018    Lab Results  Component Value Date   WBC 5.6 12/10/2018   HGB 14.2 12/10/2018   HCT 42.2 12/10/2018   PLT 222.0 12/10/2018   GLUCOSE 134 (H) 04/09/2019   CHOL 158 04/09/2019   TRIG 119.0 04/09/2019   HDL 56.70 04/09/2019   LDLDIRECT 72.0 04/12/2018   LDLCALC 78 04/09/2019   ALT 33 04/09/2019   AST 28 04/09/2019   NA 138 04/09/2019   K 3.9 04/09/2019   CL 100 04/09/2019   CREATININE 0.71 04/09/2019   BUN 13 04/09/2019   CO2 30 04/09/2019   TSH 1.62 02/04/2015   HGBA1C 7.0 (H) 04/09/2019   MICROALBUR <0.7 08/08/2018    US ABDOMEN RUQ W/ELASTOGRAPHY  Result  Date: 01/17/2019 CLINICAL DATA:  Hepatic steatosis. EXAM: US ABDOMEN LIMITED - RIGHT UPPER QUADRANT ULTRASOUND HEPATIC ELASTOGRAPHY TECHNIQUE: Sonography of the right upper quadrant was performed. In addition, ultrasound elastography evaluation of the liver was performed. A region of interest was placed within the right lobe of the liver. Following application of a compressive sonographic pulse, tissue compressibility was assessed. Multiple assessments were performed at the selected site. Median tissue compressibility was determined. Previously, hepatic stiffness was assessed by shear wave velocity. Based on recently published Society of Radiologists in Ultrasound consensus article, reporting is now recommended to be performed in the SI units of pressure (kiloPascals) representing hepatic stiffness/elasticity. The obtained result is compared to the published reference standards. (cACLD= compensated Advanced Chronic Liver Disease) COMPARISON:  None. FINDINGS: ULTRASOUND ABDOMEN LIMITED RIGHT UPPER QUADRANT Gallbladder: No gallstones or wall thickening visualized. No sonographic Murphy sign noted. Common bile duct: Diameter: 3.5 mm Liver: There is diffuse increased echogenicity of the liver and decreased  through transmission consistent with fatty infiltration. No focal lesions or biliary dilatation. Portal vein is patent on color Doppler imaging with normal direction of blood flow towards the liver. ULTRASOUND HEPATIC ELASTOGRAPHY Device: Siemens Helix VTQ Patient position: Supine Transducer 5C1 Number of measurements: 10 Hepatic segment:  8 Median kPa: 3.2 IQR: 0.5 IQR/Median kPa ratio: 0.2 Data quality:  Good Diagnostic category:  ?5 kPa: high probability of being normal IMPRESSION: ULTRASOUND RUQ: There is diffuse increased echogenicity of the liver and decreased through transmission consistent with fatty infiltration. No focal lesions or biliary dilatation. Normal gallbladder and no biliary dilatation. ULTRASOUND HEPATIC ELASTOGRAPHY: Median kPa:  3.2 Diagnostic category:  ?5 kPa: high probability of being normal The use of hepatic elastography is applicable to patients with viral hepatitis and non-alcoholic fatty liver disease. At this time, there is insufficient data for the referenced cut-off values and use in other causes of liver disease, including alcoholic liver disease. Patients, however, may be assessed by elastography and serve as their own reference standard/baseline. In patients with non-alcoholic liver disease, the values suggesting compensated advanced chronic liver disease (cACLD) may be lower, and patients may need additional testing with elasticity results of 7-9 kPa. Please note that abnormal hepatic elasticity and shear wave velocities may also be identified in clinical settings other than with hepatic fibrosis, such as: acute hepatitis, elevated right heart and central venous pressures including use of beta blockers, veno-occlusive disease ([place]), infiltrative processes such as mastocytosis/amyloidosis/infiltrative tumor/lymphoma, extrahepatic cholestasis, with hyperemia in the post-prandial state, and with liver transplantation. Correlation with patient history, laboratory data, and  clinical condition recommended. Diagnostic Categories: ?5 kPa: high probability of being normal ?9 kPa: in the absence of other known clinical signs, rules out cACLD >9 kPa and ?13 kPa: suggestive of cACLD, but needs further testing >13 kPa: highly suggestive of cACLD ?17 kPa: highly suggestive of cACLD with an increased probability of clinically significant portal hypertension Electronically Signed   By: Marijo Sanes M.D.   On: 01/17/2019 09:32    Assessment & Plan:   Problem List Items Addressed This Visit      Unprioritized   Essential hypertension    Well controlled on current regimen. Renal function stable, no changes today.  Lab Results  Component Value Date   CREATININE 0.71 04/09/2019   Lab Results  Component Value Date   NA 138 04/09/2019   K 3.9 04/09/2019   CL 100 04/09/2019   CO2 30 04/09/2019         Relevant Medications  losartan (COZAAR) 100 MG tablet   Hyperlipidemia    LDL is < 100 and triglycerides are at goal on simvastatin/zetia . She has no side effects and LFTs are normal since losing weight (hepatic steatosis hx). No changes today. Continue aspirin low dose.  Lab Results  Component Value Date   CHOL 158 04/09/2019   HDL 56.70 04/09/2019   LDLCALC 78 04/09/2019   LDLDIRECT 72.0 04/12/2018   TRIG 119.0 04/09/2019   CHOLHDL 3 04/09/2019   Lab Results  Component Value Date   ALT 33 04/09/2019   AST 28 04/09/2019   ALKPHOS 75 04/09/2019   BILITOT 0.8 04/09/2019          Relevant Medications   losartan (COZAAR) 100 MG tablet   Diabetes mellitus without complication ([facility])    Currently well-controlled on current medications .  hemoglobin A1c is at goal of  7.0 . Patient is reminded to schedule an annual eye exam and foot exam is normal today. Patient has no microalbuminuria. Patient is tolerating statin therapy for CAD risk reduction and on ACE/ARB for renal protection and hypertension   Lab Results  Component Value Date   HGBA1C 7.0 (H)  04/09/2019   Lab Results  Component Value Date   MICROALBUR <0.7 08/08/2018   MICROALBUR 1.1 11/29/2017           Relevant Medications   losartan (COZAAR) 100 MG tablet   Perineal rash in female - Primary    Etiology unclear,   May be related to psoriasis or may be Bowen's disease. Needs biopsy.  GYN referral made.       Relevant Orders   Ambulatory referral to Gynecology   Hearing loss    Referral to ENT in progress      Relevant Orders   Ambulatory referral to ENT      I have changed [name] A. Quincy's folic acid. I am also having her maintain her vitamin E, multivitamin, methotrexate, Precision Thin Lancets, Calcium Carb-Cholecalciferol, aspirin, vitamin C, glipiZIDE, Fish Oil, amLODipine, FREESTYLE LITE, ezetimibe, loratadine, omeprazole, montelukast, metFORMIN, Trulicity, hydrochlorothiazide, simvastatin, escitalopram, and losartan.  Meds ordered this encounter  Medications  . escitalopram (LEXAPRO) 5 MG tablet    Sig: Take 1 tablet (5 mg total) by mouth every evening.    Dispense:  90 tablet    Refill:  3  . losartan (COZAAR) 100 MG tablet    Sig: Take 1 tablet (100 mg total) by mouth daily.    Dispense:  90 tablet    Refill:  3    Replaces lisinopril  . folic acid (FOLVITE) 1 MG tablet    Sig: Take 1 tablet (1 mg total) by mouth See admin instructions. Takes daily except on Saturday    Dispense:  90 tablet    Refill:  3    Medications Discontinued During This Encounter  Medication Reason  . losartan (COZAAR) 100 MG tablet Reorder  . escitalopram (LEXAPRO) 5 MG tablet Reorder  . folic acid (FOLVITE) 1 MG tablet Reorder    Follow-up: Return in about 6 months (around 10/11/2019).   Crecencio Mc, MD

## 2019-04-11 NOTE — Patient Instructions (Signed)
Your diabetes remains under excellent control  And your cholesterol and other labs are also excellent too!    . Please continue your current medications. return in 6 months for follow up on diabetes and make sure you are seeing your eye doctor at least once a year for a dilated retina exam to monitor for diabetic retinopathy,. changes that can lead to blindness .   Referrals to ENT and GYN are in process.. Our referral coordinator will call you when the appointment has been made.  If you do not hear from our office in a week,   Please call us back

## 2019-04-12 DIAGNOSIS — R21 Rash and other nonspecific skin eruption: Secondary | ICD-10-CM | POA: Insufficient documentation

## 2019-04-12 DIAGNOSIS — H9190 Unspecified hearing loss, unspecified ear: Secondary | ICD-10-CM | POA: Insufficient documentation

## 2019-04-12 NOTE — Assessment & Plan Note (Signed)
Etiology unclear,   May be related to psoriasis or may be Bowen's disease. Needs biopsy.  GYN referral made.

## 2019-04-12 NOTE — Assessment & Plan Note (Signed)
Referral to ENT in progress

## 2019-04-12 NOTE — Assessment & Plan Note (Addendum)
LDL is < 100 and triglycerides are at goal on simvastatin/zetia . She has no side effects and LFTs are normal since losing weight (hepatic steatosis hx). No changes today. Continue aspirin low dose.  Lab Results  Component Value Date   CHOL 158 04/09/2019   HDL 56.70 04/09/2019   LDLCALC 78 04/09/2019   LDLDIRECT 72.0 04/12/2018   TRIG 119.0 04/09/2019   CHOLHDL 3 04/09/2019   Lab Results  Component Value Date   ALT 33 04/09/2019   AST 28 04/09/2019   ALKPHOS 75 04/09/2019   BILITOT 0.8 04/09/2019

## 2019-04-12 NOTE — Assessment & Plan Note (Signed)
Well controlled on current regimen. Renal function stable, no changes today.  Lab Results  Component Value Date   CREATININE 0.71 04/09/2019   Lab Results  Component Value Date   NA 138 04/09/2019   K 3.9 04/09/2019   CL 100 04/09/2019   CO2 30 04/09/2019

## 2019-04-12 NOTE — Assessment & Plan Note (Signed)
Currently well-controlled on current medications .  hemoglobin A1c is at goal of  7.0 . Patient is reminded to schedule an annual eye exam and foot exam is normal today. Patient has no microalbuminuria. Patient is tolerating statin therapy for CAD risk reduction and on ACE/ARB for renal protection and hypertension   Lab Results  Component Value Date   HGBA1C 7.0 (H) 04/09/2019   Lab Results  Component Value Date   MICROALBUR <0.7 08/08/2018   MICROALBUR 1.1 11/29/2017

## 2019-04-16 ENCOUNTER — Ambulatory Visit: Payer: Medicare Other | Admitting: Gastroenterology

## 2019-04-19 DIAGNOSIS — H903 Sensorineural hearing loss, bilateral: Secondary | ICD-10-CM | POA: Diagnosis not present

## 2019-04-22 ENCOUNTER — Other Ambulatory Visit: Payer: Self-pay

## 2019-04-22 DIAGNOSIS — L237 Allergic contact dermatitis due to plants, except food: Secondary | ICD-10-CM | POA: Diagnosis not present

## 2019-04-23 ENCOUNTER — Encounter: Payer: Self-pay | Admitting: Gastroenterology

## 2019-04-23 ENCOUNTER — Ambulatory Visit (INDEPENDENT_AMBULATORY_CARE_PROVIDER_SITE_OTHER): Payer: Medicare Other | Admitting: Gastroenterology

## 2019-04-23 ENCOUNTER — Other Ambulatory Visit: Payer: Self-pay

## 2019-04-23 VITALS — BP 166/80 | HR 73 | Temp 97.5°F | Ht 59.0 in | Wt 131.0 lb

## 2019-04-23 DIAGNOSIS — K760 Fatty (change of) liver, not elsewhere classified: Secondary | ICD-10-CM | POA: Diagnosis not present

## 2019-04-23 NOTE — Progress Notes (Signed)
Emily Darby, MD [address]  [place]  [place], [place] [address]  Main: [phone]  Fax: [phone]    Gastroenterology Consultation  Referring Provider:     Crecencio Mc, MD Primary Care Physician:  Crecencio Mc, MD Primary Gastroenterologist:  Dr. Cephas Ryan Reason for Consultation:     Chronic methotrexate use, fatty liver disease        HPI:   Emily Ryan is a 74 y.o. pleasant Caucasian female referred by Dr. Crecencio Mc, MD  for consultation & management of chronic methotrexate use. She has history of psoriasis and has been on methotrexate for last 6 years or so. She is referred to me for risk stratification because of total cumulative dose of methotrexate >3gm since initiation of medication. She reports that her psoriasis is completely under control on methotrexate 7.5 mg weekly. Her dermatologist tried to decrease the dose to 5 mg weekly and symptoms recurred. She takes daily folate acid. She does not have any known liver disease. Her most recent LFTs have been normal. Her head CT antibody negative. Unknown hepatitis B status. She does have diabetes, last A1c 7. She reports that she likes baking and consumes a lot of white bread. It's hard for her to lose weight. She denies any GI symptoms today. She denies blood transfusions in the past  She had a colonic perforation underwent emergent surgery in 2012, as well as bowel resection. She was told that she may had perforated diverticulitis and she had to bowel prep that resulted in perforation. She said that they attempted colonoscopy in 2015 and was unsuccessful due to tight stricture in the left colon, therefore underwent a virtual CT colonoscopy which revealed a rectosigmoid stricture and inflammation. She had normal colonoscopy in 2005  Follow-up visit 01/08/2019 Patient underwent resection of the sigmoid stricture with primary anastomosis by Dr. Hampton Abbot in 02/2018 and recovered well.  She did notice  significant improvement in her bowel habits.  She is no longer spending a lot of time on toilet.  Her bowels are regular not associated with straining.  She gained few pounds since surgery and she was taking care of her husband and stopped exercising due to pandemic.  Her hemoglobin A1c has gotten worse.  She is currently on methotrexate 7.5 mg weekly for psoriasis.  Most recent LFTs revealed mildly elevated ALT on 12/2018 She does not have any GI complaints today  Follow-up visit 04/23/2019 Patient reports doing very well.  Her diabetes is improving, hemoglobin A1c is 7.  LFTs are normal.  She is staying more active, hiking, following portion diet.  She reports mild constipation, taking soluble fiber every other day.  Forgets to drink water  NSAIDs: none  Antiplts/Anticoagulants/Anti thrombotics: none  GI Procedures:  Colonoscopy normal in 2005 CT virtual colonography in 2015 with no polyps detected  Past Medical History:  Diagnosis Date  . Complicated pregnancy    1st pregnancy complicated by post operative hemorrhage and 2ng complicated by epidural  . Diabetes mellitus   . Diverticulosis of colon (without mention of hemorrhage)   . Fracture of malleolus of right ankle, closed, with routine healing, subsequent encounter 12/09/2017  . GERD (gastroesophageal reflux disease)   . Guttate psoriasis   . Hyperlipidemia   . Hypertension   . Menopausal disorder   . Rectal stricture   . Skin cancer     Past Surgical History:  Procedure Laterality Date  . ABDOMINAL HYSTERECTOMY    .  APPENDECTOMY  Dec 2012  . BLADDER REPAIR  1991   lift   . COLON RESECTION SIGMOID N/A 02/19/2018   Procedure: LAPAROSCOPIC SIGMOID COLECTOMY- POSSIBLE OPEN;  Surgeon: Olean Ree, MD;  Location: [facility] ORS;  Service: General;  Laterality: N/A;  . COLONOSCOPY WITH PROPOFOL N/A 07/17/2017   Procedure: COLONOSCOPY WITH PROPOFOL;  Surgeon: Lin Landsman, MD;  Location: [facility] ENDOSCOPY;  Service:  Gastroenterology;  Laterality: N/A;  . COLONOSCOPY WITH PROPOFOL N/A 11/02/2017   Procedure: COLONOSCOPY WITH PROPOFOL;  Surgeon: Lin Landsman, MD;  Location: [facility] ENDOSCOPY;  Service: Gastroenterology;  Laterality: N/A;  . CYSTOCELE REPAIR    . CYSTOSCOPY W/ URETERAL STENT PLACEMENT Bilateral 02/19/2018   Procedure: CYSTOSCOPY WITH STENT PLACEMENT GOING 1ST;  Surgeon: Billey Co, MD;  Location: [facility] ORS;  Service: Urology;  Laterality: Bilateral;  . FLEXIBLE SIGMOIDOSCOPY N/A 07/31/2017   Procedure: FLEXIBLE SIGMOIDOSCOPY;  Surgeon: Lin Landsman, MD;  Location: [facility] ENDOSCOPY;  Service: Gastroenterology;  Laterality: N/A;  . HERNIA REPAIR    . MOHS SURGERY N/A 05/2015   Face  . PARTIAL COLECTOMY  Nov 2012   left, secondary to diverticular perf  . RECTOCELE REPAIR    . SEPTOPLASTY  1969  . TEE WITHOUT CARDIOVERSION N/A 11/24/2015   Procedure: TRANSESOPHAGEAL ECHOCARDIOGRAM (TEE);  Surgeon: Minna Merritts, MD;  Location: [facility] ORS;  Service: Cardiovascular;  Laterality: N/A;  . TONSILLECTOMY  1953  . VARICOSE VEIN SURGERY  1974   right leg     Current Outpatient Medications:  .  amLODipine (NORVASC) 2.5 MG tablet, Take 1 tablet (2.5 mg total) by mouth daily., Disp: 90 tablet, Rfl: 3 .  Ascorbic Acid (VITAMIN C) 1000 MG tablet, Take 1,000 mg by mouth daily. , Disp: , Rfl:  .  aspirin 81 MG chewable tablet, Chew 81 mg by mouth daily. , Disp: , Rfl:  .  Calcium Carb-Cholecalciferol 600-100 MG-UNIT CAPS, Take 1 tablet by mouth daily. Two by mouth daily (Patient taking differently: Take 1 tablet by mouth daily. ), Disp: 90 capsule, Rfl: 3 .  calcium carbonate (OSCAL) 1500 (600 Ca) MG TABS tablet, Take by mouth., Disp: , Rfl:  .  clobetasol cream (TEMOVATE) 0.05 %, , Disp: , Rfl:  .  Dulaglutide (TRULICITY) 1.5 0000000 SOPN, Inject 1.5 mg into the skin once a week., Disp: 12 pen, Rfl: 1 .  escitalopram (LEXAPRO) 5 MG tablet, Take 1 tablet (5 mg total) by mouth every  evening., Disp: 90 tablet, Rfl: 3 .  ezetimibe (ZETIA) 10 MG tablet, Take 1 tablet (10 mg total) by mouth daily., Disp: 90 tablet, Rfl: 3 .  folic acid (FOLVITE) 1 MG tablet, Take 1 tablet (1 mg total) by mouth See admin instructions. Takes daily except on Saturday, Disp: 90 tablet, Rfl: 3 .  glipiZIDE (GLUCOTROL) 5 MG tablet, Take 1 tablet (5 mg total) by mouth 2 (two) times daily before a meal., Disp: 180 tablet, Rfl: 3 .  glucose blood (FREESTYLE LITE) test strip, Use as instructed to test blood sugar twice daily, Disp: 200 each, Rfl: 3 .  hydrochlorothiazide (HYDRODIURIL) 12.5 MG tablet, Take 1 tablet (12.5 mg total) by mouth daily., Disp: 90 tablet, Rfl: 3 .  hydrocortisone 2.5 % cream, , Disp: , Rfl:  .  loratadine (CLARITIN) 10 MG tablet, Take 1 tablet (10 mg total) by mouth daily., Disp: 90 tablet, Rfl: 3 .  losartan (COZAAR) 100 MG tablet, Take 1 tablet (100 mg total) by mouth daily., Disp: 90 tablet, Rfl:  3 .  metFORMIN (GLUCOPHAGE XR) 750 MG 24 hr tablet, Take 1 tablet (750 mg total) by mouth daily with breakfast., Disp: 90 tablet, Rfl: 3 .  methotrexate (RHEUMATREX) 2.5 MG tablet, Take 7.5 mg by mouth every Saturday. , Disp: , Rfl:  .  montelukast (SINGULAIR) 10 MG tablet, Take 1 tablet (10 mg total) by mouth at bedtime., Disp: 90 tablet, Rfl: 3 .  Multiple Vitamin (MULTIVITAMIN) capsule, Take 1 capsule by mouth daily.  , Disp: , Rfl:  .  Omega-3 Fatty Acids (FISH OIL) 1000 MG CAPS, Take 1 capsule by mouth daily., Disp: , Rfl:  .  omeprazole (PRILOSEC) 20 MG capsule, Take 1 capsule (20 mg total) by mouth daily., Disp: 90 capsule, Rfl: 3 .  Precision Thin Lancets MISC, Use as directed to check sugars twice daily, Disp: 180 each, Rfl: 3 .  simvastatin (ZOCOR) 20 MG tablet, Take 1 tablet (20 mg total) by mouth at bedtime., Disp: 90 tablet, Rfl: 3 .  vitamin E 400 UNIT capsule, Take 400 Units by mouth daily.  , Disp: , Rfl:     Family History  Problem Relation Age of Onset  . Heart  Problems Mother   . Macular degeneration Mother   . Cancer Father   . Cancer Maternal Grandmother        colon ca  . Diabetes Sister   . Dementia Sister   . Diabetes Brother   . Stroke Brother   . Diabetes Brother   . Breast cancer Maternal Aunt   . Breast cancer Paternal Aunt   . Lung cancer Cousin   . Lung disease Neg Hx   . Rheumatologic disease Neg Hx      Social History   Tobacco Use  . Smoking status: Never Smoker  . Smokeless tobacco: Never Used  . Tobacco comment: father growing up  Substance Use Topics  . Alcohol use: Yes    Comment: Occasional  . Drug use: No    Allergies as of 04/23/2019  . (No Known Allergies)    Review of Systems:    All systems reviewed and negative except where noted in HPI.   Physical Exam:  BP (!) 166/80 (BP Location: Left Arm, Patient Position: Sitting, Cuff Size: Normal)   Pulse 73   Temp (!) 97.5 F (36.4 C) (Oral)   Ht 4\' 11"  (1.499 m)   Wt 131 lb (59.4 kg)   BMI 26.46 kg/m  No LMP recorded. Patient has had a hysterectomy.  General:   Alert,  Well-developed, well-nourished, pleasant and cooperative in NAD Head:  Normocephalic and atraumatic. Eyes:  Sclera clear, no icterus.   Conjunctiva pink. Ears:  Normal auditory acuity. Neck:  Supple; no masses or thyromegaly. Lungs:  Respirations even and unlabored.  Clear throughout to auscultation.   No wheezes, crackles, or rhonchi. No acute distress. Heart:  Regular rate and rhythm; no murmurs, clicks, rubs, or gallops. Abdomen:  Normal bowel sounds. Soft, non-tender and non-distended without masses, hepatosplenomegaly or hernias noted.  No guarding or rebound tenderness.   Rectal: Not performed Msk:  Symmetrical without gross deformities. Good, equal movement & strength bilaterally. Pulses:  Normal pulses noted. Extremities:  No clubbing or edema.  No cyanosis. Neurologic:  Alert and oriented x3;  grossly normal neurologically. Skin:  Intact without significant lesions, rash  on the left shin due to poison ivy and dry scaly rash on the face. No jaundice. Psych:  Alert and cooperative. Normal mood and affect.  Imaging Studies: reviewed  Assessment and Plan:   Emily Ryan is a 74 y.o. Caucasian female with history of psoriasis, on methotrexate 7.5 mg weekly, in remission, history of perforated diverticulitis, resulting in partial colectomy, rectosigmoid stricture s/p resection with primary anastomosis in 02/2018 is seen for follow-up of fatty liver and elevated LFTs and monitoring of liver on methotrexate use.  Generally, there is a risk for progression to fibrosis or cirrhosis when total cumulative methotrexate dose exceeds 1.5 to 2gm. Patient has history of diabetes, hyperlipidemia, overweight which can increase her risk of progression from fatty liver to fibrosis.  Previous ultrasound liver with elastography revealed minimal fibrosis along with fatty liver. Hepatitis B surface antigen, surface antibody, and core antibody total negative, Quantiferon gold negative  Fatty liver disease: LFTs are now normal Ultrasound with elastography revealed fatty liver only.  No evidence of fibrosis. NASH fibrosis panel revealed severe hepatic steatosis, no fibrosis -Continue methotrexate 7.5 mg weekly along with daily folate for now as her psoriasis is completely under remission -strongly advised for tight control of diabetes, hyperlipidemia to prevent progression of fatty liver Patient is interested to know improvement in her steatosis score.  We will repeat [place] fibrosis panel in 3 to 4 months Recheck LFTs every 6 months  History of colon adenomas Recommend surveillance colonoscopy in 10/2020  Constipation Advised on increase fiber intake, adequate intake of water   Follow up as needed  Emily Darby, MD

## 2019-04-25 ENCOUNTER — Telehealth: Payer: Self-pay | Admitting: Internal Medicine

## 2019-04-25 NOTE — Telephone Encounter (Signed)
"  LVM FOR PT TO CB TO SCHED APPT" Coy said 13 days ago

## 2019-05-02 ENCOUNTER — Other Ambulatory Visit: Payer: Self-pay

## 2019-05-02 DIAGNOSIS — Z08 Encounter for follow-up examination after completed treatment for malignant neoplasm: Secondary | ICD-10-CM | POA: Diagnosis not present

## 2019-05-02 DIAGNOSIS — Z85828 Personal history of other malignant neoplasm of skin: Secondary | ICD-10-CM | POA: Diagnosis not present

## 2019-05-02 DIAGNOSIS — Z872 Personal history of diseases of the skin and subcutaneous tissue: Secondary | ICD-10-CM | POA: Diagnosis not present

## 2019-05-02 DIAGNOSIS — L237 Allergic contact dermatitis due to plants, except food: Secondary | ICD-10-CM | POA: Diagnosis not present

## 2019-05-02 DIAGNOSIS — Z09 Encounter for follow-up examination after completed treatment for conditions other than malignant neoplasm: Secondary | ICD-10-CM | POA: Diagnosis not present

## 2019-05-02 DIAGNOSIS — L400 Psoriasis vulgaris: Secondary | ICD-10-CM | POA: Diagnosis not present

## 2019-05-02 MED ORDER — GLIPIZIDE 5 MG PO TABS: 5.0000 mg | ORAL_TABLET | Freq: Two times a day (BID) | ORAL | 3 refills | Status: DC

## 2019-05-03 ENCOUNTER — Other Ambulatory Visit: Payer: Self-pay

## 2019-05-06 DIAGNOSIS — L400 Psoriasis vulgaris: Secondary | ICD-10-CM | POA: Diagnosis not present

## 2019-05-07 NOTE — Progress Notes (Signed)
New pt present to est care due to having persistent perineal irritation and hx of psoriasis. Pt stated having perineal irritation x 2 years.  Vaginal burning after sex. Pt had a hysterectomy in 2002.

## 2019-05-08 ENCOUNTER — Other Ambulatory Visit: Payer: Self-pay

## 2019-05-08 ENCOUNTER — Encounter: Payer: Self-pay | Admitting: Obstetrics and Gynecology

## 2019-05-08 ENCOUNTER — Ambulatory Visit (INDEPENDENT_AMBULATORY_CARE_PROVIDER_SITE_OTHER): Payer: Medicare Other | Admitting: Obstetrics and Gynecology

## 2019-05-08 VITALS — BP 151/75 | HR 76 | Ht 59.0 in | Wt 131.5 lb

## 2019-05-08 DIAGNOSIS — N952 Postmenopausal atrophic vaginitis: Secondary | ICD-10-CM | POA: Diagnosis not present

## 2019-05-08 MED ORDER — PREMARIN 0.625 MG/GM VA CREA: 1.0000 | TOPICAL_CREAM | Freq: Every day | VAGINAL | 3 refills | Status: DC

## 2019-05-08 NOTE — Patient Instructions (Signed)
Atrophic Vaginitis  Atrophic vaginitis is a condition in which the tissues that line the vagina become dry and thin. This condition is most common in women who have stopped having regular menstrual periods (are in menopause). This usually starts when a woman is 45-75 years old. That is the time when a woman's estrogen levels begin to drop (decrease). Estrogen is a female hormone. It helps to keep the tissues of the vagina moist. It stimulates the vagina to produce a clear fluid that lubricates the vagina for sexual intercourse. This fluid also protects the vagina from infection. Lack of estrogen can cause the lining of the vagina to get thinner and dryer. The vagina may also shrink in size. It may become less elastic. Atrophic vaginitis tends to get worse over time as a woman's estrogen level drops. What are the causes? This condition is caused by the normal drop in estrogen that happens around the time of menopause. What increases the risk? Certain conditions or situations may lower a woman's estrogen level, leading to a higher risk for atrophic vaginitis. You are more likely to develop this condition if:  You are taking medicines that block estrogen.  You have had your ovaries removed.  You are being treated for cancer with X-ray (radiation) or medicines (chemotherapy).  You have given birth or are breastfeeding.  You are older than age 50.  You smoke. What are the signs or symptoms? Symptoms of this condition include:  Pain, soreness, or bleeding during sexual intercourse (dyspareunia).  Vaginal burning, irritation, or itching.  Pain or bleeding when a speculum is used in a vaginal exam (pelvic exam).  Having burning pain when passing urine.  Vaginal discharge that is brown or yellow. In some cases, there are no symptoms. How is this diagnosed? This condition is diagnosed by taking a medical history and doing a physical exam. This will include a pelvic exam that checks the  vaginal tissues. Though rare, you may also have other tests, including:  A urine test.  A test that checks the acid balance in your vagina (acid balance test). How is this treated? Treatment for this condition depends on how severe your symptoms are. Treatment may include:  Using an over-the-counter vaginal lubricant before sex.  Using a long-acting vaginal moisturizer.  Using low-dose vaginal estrogen for moderate to severe symptoms that do not respond to other treatments. Options include creams, tablets, and inserts (vaginal rings). Before you use a vaginal estrogen, tell your health care provider if you have a history of: ? Breast cancer. ? Endometrial cancer. ? Blood clots. If you are not sexually active and your symptoms are very mild, you may not need treatment. Follow these instructions at home: Medicines  Take over-the-counter and prescription medicines only as told by your health care provider. Do not use herbal or alternative medicines unless your health care provider says that you can.  Use over-the-counter creams, lubricants, or moisturizers for dryness only as directed by your health care provider. General instructions  If your atrophic vaginitis is caused by menopause, discuss all of your menopause symptoms and treatment options with your health care provider.  Do not douche.  Do not use products that can make your vagina dry. These include: ? Scented feminine sprays. ? Scented tampons. ? Scented soaps.  Vaginal intercourse can help to improve blood flow and elasticity of vaginal tissue. If it hurts to have sex, try using a lubricant or moisturizer just before having intercourse. Contact a health care provider if:    Your discharge looks different than normal.  Your vagina has an unusual smell.  You have new symptoms.  Your symptoms do not improve with treatment.  Your symptoms get worse. Summary  Atrophic vaginitis is a condition in which the tissues that  line the vagina become dry and thin. It is most common in women who have stopped having regular menstrual periods (are in menopause).  Treatment options include using vaginal lubricants and low-dose vaginal estrogen.  Contact a health care provider if your vagina has an unusual smell, or if your symptoms get worse or do not improve after treatment. This information is not intended to replace advice given to you by your health care provider. Make sure you discuss any questions you have with your health care provider. Document Revised: 12/02/2016 Document Reviewed: 09/15/2016 Elsevier Patient Education  2020 [employer]  

## 2019-05-08 NOTE — Progress Notes (Signed)
GYNECOLOGY PROGRESS NOTE  Subjective:    Patient ID: Emily Ryan, female    DOB: April 21, 1945, 74 y.o.   MRN: [id number]  HPI  Patient is a 74 y.o. G14P2002 female who presents as a referral from her PCP (Dr. Deborra Medina)  for complaints of pain and irritation in the perineal area, especially after intercourse for the past 2-3 years.  She has a past medical history significant for psoriasis and basal cell carcinoma. Sometimes uses KY-jelly to help, but feels like she is having a skin tear in this area.  Occasionally causes discomfort after urination.  Associated itching several days after intercourse.    Past Medical History:  Diagnosis Date  . Complicated pregnancy    1st pregnancy complicated by post operative hemorrhage and 2ng complicated by epidural  . Diabetes mellitus   . Diverticulosis of colon (without mention of hemorrhage)   . Fracture of malleolus of right ankle, closed, with routine healing, subsequent encounter 12/09/2017  . GERD (gastroesophageal reflux disease)   . Guttate psoriasis   . Hyperlipidemia   . Hypertension   . Menopausal disorder   . Rectal stricture   . Skin cancer     Past Surgical History:  Procedure Laterality Date  . ABDOMINAL HYSTERECTOMY    . APPENDECTOMY  Dec 2012  . BLADDER REPAIR  1991   lift   . COLON RESECTION SIGMOID N/A 02/19/2018   Procedure: LAPAROSCOPIC SIGMOID COLECTOMY- POSSIBLE OPEN;  Surgeon: Olean Ree, MD;  Location: [facility] ORS;  Service: General;  Laterality: N/A;  . COLONOSCOPY WITH PROPOFOL N/A 07/17/2017   Procedure: COLONOSCOPY WITH PROPOFOL;  Surgeon: Lin Landsman, MD;  Location: [facility] ENDOSCOPY;  Service: Gastroenterology;  Laterality: N/A;  . COLONOSCOPY WITH PROPOFOL N/A 11/02/2017   Procedure: COLONOSCOPY WITH PROPOFOL;  Surgeon: Lin Landsman, MD;  Location: [facility] ENDOSCOPY;  Service: Gastroenterology;  Laterality: N/A;  . CYSTOCELE REPAIR    . CYSTOSCOPY W/ URETERAL STENT PLACEMENT Bilateral  02/19/2018   Procedure: CYSTOSCOPY WITH STENT PLACEMENT GOING 1ST;  Surgeon: Billey Co, MD;  Location: [facility] ORS;  Service: Urology;  Laterality: Bilateral;  . FLEXIBLE SIGMOIDOSCOPY N/A 07/31/2017   Procedure: FLEXIBLE SIGMOIDOSCOPY;  Surgeon: Lin Landsman, MD;  Location: [facility] ENDOSCOPY;  Service: Gastroenterology;  Laterality: N/A;  . HERNIA REPAIR    . MOHS SURGERY N/A 05/2015   Face  . PARTIAL COLECTOMY  Nov 2012   left, secondary to diverticular perf  . RECTOCELE REPAIR    . SEPTOPLASTY  1969  . TEE WITHOUT CARDIOVERSION N/A 11/24/2015   Procedure: TRANSESOPHAGEAL ECHOCARDIOGRAM (TEE);  Surgeon: Minna Merritts, MD;  Location: [facility] ORS;  Service: Cardiovascular;  Laterality: N/A;  . TONSILLECTOMY  1953  . VARICOSE VEIN SURGERY  1974   right leg     Family History  Problem Relation Age of Onset  . Heart Problems Mother   . Macular degeneration Mother   . Cancer Father   . Cancer Maternal Grandmother        colon ca  . Diabetes Sister   . Dementia Sister   . Diabetes Brother   . Stroke Brother   . Diabetes Brother   . Breast cancer Maternal Aunt   . Breast cancer Paternal Aunt   . Lung cancer Cousin   . Lung disease Neg Hx   . Rheumatologic disease Neg Hx     Social History   Socioeconomic History  . Marital status: Married    Spouse name: Not on  file  . Number of children: Not on file  . Years of education: Not on file  . Highest education level: Not on file  Occupational History  . Not on file  Tobacco Use  . Smoking status: Never Smoker  . Smokeless tobacco: Never Used  . Tobacco comment: father growing up  Substance and Sexual Activity  . Alcohol use: Yes    Comment: Occasional  . Drug use: No  . Sexual activity: Yes    Birth control/protection: Post-menopausal  Other Topics Concern  . Not on file  Social History Narrative   [facility] (11/14/16):   She is originally from [place]. Previously has worked as a Network engineer, in [employer], & in  [employer]. Her husband was in [employer] and they traveled extensively. She lived in [place], [place], [place], [place], [place], [place]. She has 2 cats currently. Remote parakeet exposure as a child. No mold exposure. No recent hot tub exposure.    Social Determinants of Health   Financial Resource Strain:   . Difficulty of Paying Living Expenses:   Food Insecurity:   . Worried About Charity fundraiser in the Last Year:   . Arboriculturist in the Last Year:   Transportation Needs:   . Film/video editor (Medical):   Marland Kitchen Lack of Transportation (Non-Medical):   Physical Activity:   . Days of Exercise per Week:   . Minutes of Exercise per Session:   Stress:   . Feeling of Stress :   Social Connections:   . Frequency of Communication with Friends and Family:   . Frequency of Social Gatherings with Friends and Family:   . Attends Religious Services:   . Active Member of Clubs or Organizations:   . Attends Archivist Meetings:   Marland Kitchen Marital Status:   Intimate Partner Violence:   . Fear of Current or Ex-Partner:   . Emotionally Abused:   Marland Kitchen Physically Abused:   . Sexually Abused:     Current Outpatient Medications on File Prior to Visit  Medication Sig Dispense Refill  . amLODipine (NORVASC) 2.5 MG tablet Take 1 tablet (2.5 mg total) by mouth daily. 90 tablet 3  . Ascorbic Acid (VITAMIN C) 1000 MG tablet Take 1,000 mg by mouth daily.     Marland Kitchen aspirin 81 MG chewable tablet Chew 81 mg by mouth daily.     . Calcium Carb-Cholecalciferol 600-100 MG-UNIT CAPS Take 1 tablet by mouth daily. Two by mouth daily (Patient taking differently: Take 1 tablet by mouth daily. ) 90 capsule 3  . calcium carbonate (OSCAL) 1500 (600 Ca) MG TABS tablet Take by mouth.    . clobetasol cream (TEMOVATE) 0.05 %     . Dulaglutide (TRULICITY) 1.5 0000000 SOPN Inject 1.5 mg into the skin once a week. 12 pen 1  . escitalopram (LEXAPRO) 5 MG tablet Take 1 tablet (5 mg total) by mouth every evening. 90 tablet 3  .  ezetimibe (ZETIA) 10 MG tablet Take 1 tablet (10 mg total) by mouth daily. 90 tablet 3  . folic acid (FOLVITE) 1 MG tablet Take 1 tablet (1 mg total) by mouth See admin instructions. Takes daily except on Saturday 90 tablet 3  . glipiZIDE (GLUCOTROL) 5 MG tablet Take 1 tablet (5 mg total) by mouth 2 (two) times daily before a meal. 180 tablet 3  . glucose blood (FREESTYLE LITE) test strip Use as instructed to test blood sugar twice daily 200 each 3  .  hydrochlorothiazide (HYDRODIURIL) 12.5 MG tablet Take 1 tablet (12.5 mg total) by mouth daily. 90 tablet 3  . hydrocortisone 2.5 % cream     . loratadine (CLARITIN) 10 MG tablet Take 1 tablet (10 mg total) by mouth daily. 90 tablet 3  . losartan (COZAAR) 100 MG tablet Take 1 tablet (100 mg total) by mouth daily. 90 tablet 3  . metFORMIN (GLUCOPHAGE XR) 750 MG 24 hr tablet Take 1 tablet (750 mg total) by mouth daily with breakfast. 90 tablet 3  . methotrexate (RHEUMATREX) 2.5 MG tablet Take 7.5 mg by mouth every Saturday.     . montelukast (SINGULAIR) 10 MG tablet Take 1 tablet (10 mg total) by mouth at bedtime. 90 tablet 3  . Multiple Vitamin (MULTIVITAMIN) capsule Take 1 capsule by mouth daily.      . Omega-3 Fatty Acids (FISH OIL) 1000 MG CAPS Take 1 capsule by mouth daily.    Marland Kitchen omeprazole (PRILOSEC) 20 MG capsule Take 1 capsule (20 mg total) by mouth daily. 90 capsule 3  . Precision Thin Lancets MISC Use as directed to check sugars twice daily 180 each 3  . simvastatin (ZOCOR) 20 MG tablet Take 1 tablet (20 mg total) by mouth at bedtime. 90 tablet 3  . vitamin E 400 UNIT capsule Take 400 Units by mouth daily.       No current facility-administered medications on file prior to visit.    No Known Allergies   Review of Systems Constitutional: negative for chills, fatigue, fevers and sweats Eyes: negative for irritation, redness and visual disturbance Ears, nose, mouth, throat, and face: negative for hearing loss, nasal congestion, snoring and  tinnitus Respiratory: negative for asthma, cough, sputum Cardiovascular: negative for chest pain, dyspnea, exertional chest pressure/discomfort, irregular heart beat, palpitations and syncope Gastrointestinal: negative for abdominal pain, change in bowel habits, nausea and vomiting Genitourinary: negative for abnormal menstrual periods, genital lesions, and vaginal discharge, dysuria and urinary incontinence. Positive for dyspareunia and perineal irritation (see HPI) Integument/breast: negative for breast lump, breast tenderness and nipple discharge Hematologic/lymphatic: negative for bleeding and easy bruising Musculoskeletal:negative for back pain and muscle weakness Neurological: negative for dizziness, headaches, vertigo and weakness Endocrine: negative for diabetic symptoms including polydipsia, polyuria and skin dryness Allergic/Immunologic: negative for hay fever and urticaria      Objective:   Blood pressure (!) 151/75, pulse 76, height 4\' 11"  (1.499 m), weight 131 lb 8 oz (59.6 kg). General appearance: alert and no distress Abdomen: soft, non-tender; bowel sounds normal; no masses,  no organomegaly Pelvic: external genitalia overall normal with some mild pallor of the labia minora.  Rectovaginal septum with moderate atrophy, possible lichenification, and 2 small healing fissures. Moderate atrophy of the vaginal hymen noted. Speculum an bimanual exam not performed as patient denied any internal vaginal or pelvic symptoms.   Assessment:   Vulvo-vaginal atrophy  Plan:   - Patient with likely vulvo-vaginal atrophy, however possibly mild area of lichenification as well.  Advised on use nickel-sized amount of Premarin cream for area, to begin use daily x 2 weeks, then decrease to 2 times per week.  Will f/u in 1 month to reassess symptoms. If no improvement, can consider biopsy and use of Temavate (was using in the past for her psoriasis, but now on Methotrexate for this) for possible  lichens.  Discussed findings with patient. Given samples of cream and will also give prescription.    Rubie Maid, MD [facility]

## 2019-06-10 NOTE — Progress Notes (Signed)
Pt present for follow up check for vaginal atrophy. Pt stated that her symptoms of vaginal atrophy has improved since her last visit 05/08/2019.

## 2019-06-11 ENCOUNTER — Ambulatory Visit (INDEPENDENT_AMBULATORY_CARE_PROVIDER_SITE_OTHER): Payer: Medicare Other | Admitting: Obstetrics and Gynecology

## 2019-06-11 ENCOUNTER — Other Ambulatory Visit: Payer: Self-pay

## 2019-06-11 ENCOUNTER — Encounter: Payer: Self-pay | Admitting: Obstetrics and Gynecology

## 2019-06-11 VITALS — BP 123/73 | HR 85 | Ht 59.0 in | Wt 131.3 lb

## 2019-06-11 DIAGNOSIS — N952 Postmenopausal atrophic vaginitis: Secondary | ICD-10-CM | POA: Diagnosis not present

## 2019-06-11 DIAGNOSIS — N941 Unspecified dyspareunia: Secondary | ICD-10-CM

## 2019-06-11 NOTE — Progress Notes (Signed)
    GYNECOLOGY PROGRESS NOTE  Subjective:    Patient ID: Emily Ryan, female    DOB: 1945-03-11, 73 y.o.   MRN: [id number]  HPI  Patient is a 74 y.o. G23P2002 female who presents for 1 month f/u of vaginal atrophy and dyspareunia.  She was initiated on Premarin cream last visit.  [name] reports significant improvement in her symptoms so far.  Attempted to use vaginal lubricants in the recent past (KY jelly) but notes that it caused some irritation and burning.   The following portions of the patient's history were reviewed and updated as appropriate: allergies, current medications, past family history, past medical history, past social history, past surgical history and problem list.  Review of Systems Pertinent items noted in HPI and remainder of comprehensive ROS otherwise negative.   Objective:   Blood pressure 123/73, pulse 85, height 4\' 11"  (1.499 m), weight 131 lb 4.8 oz (59.6 kg). General appearance: alert and no distress Remainder of exam deferred.    Assessment:   Vaginal atrophy Dyspareunia  Plan:   Both vaginal atrophy and dyspareunia improving with Premarin cream.  To continue use.  Will re-evaluate after 3 completed months of use with exam.  Given samples of Uberlube, also offered other vaginal lubricants for intercourse such as Joe's H20.  RTC in 2 months.    Rubie Maid, MD [facility]

## 2019-06-12 ENCOUNTER — Encounter: Payer: Self-pay | Admitting: Obstetrics and Gynecology

## 2019-06-18 MED ORDER — TRULICITY 1.5 MG/0.5ML ~~LOC~~ SOAJ: 1.5000 mg | SUBCUTANEOUS | 11 refills | Status: DC

## 2019-06-21 ENCOUNTER — Other Ambulatory Visit: Payer: Self-pay

## 2019-06-21 MED ORDER — AMLODIPINE BESYLATE 2.5 MG PO TABS: 2.5000 mg | ORAL_TABLET | Freq: Every day | ORAL | 1 refills | Status: DC

## 2019-08-20 ENCOUNTER — Encounter: Payer: Self-pay | Admitting: Obstetrics and Gynecology

## 2019-08-20 ENCOUNTER — Other Ambulatory Visit: Payer: Self-pay

## 2019-08-20 ENCOUNTER — Ambulatory Visit (INDEPENDENT_AMBULATORY_CARE_PROVIDER_SITE_OTHER): Payer: Medicare Other | Admitting: Obstetrics and Gynecology

## 2019-08-20 VITALS — BP 144/77 | HR 80 | Ht 59.0 in | Wt 132.8 lb

## 2019-08-20 DIAGNOSIS — N952 Postmenopausal atrophic vaginitis: Secondary | ICD-10-CM

## 2019-08-20 DIAGNOSIS — N941 Unspecified dyspareunia: Secondary | ICD-10-CM

## 2019-08-20 NOTE — Progress Notes (Signed)
    GYNECOLOGY PROGRESS NOTE  Subjective:    Patient ID: Emily Ryan, female    DOB: [birth year], 74 y.o.   MRN: [id number]  HPI  Patient is a 74 y.o. G58P2002 female who presents for 3 month f/u of vaginal atrophy and dyspareunia.  Has been using Premarin cream as prescribed for the past 3 months. She continues to note improvement in her symptoms. Also notes that samples of UberLube greatly helped her dyspareunia (also tried Joe's H20, but liked Uberlube better).   The following portions of the patient's history were reviewed and updated as appropriate: allergies, current medications, past family history, past medical history, past social history, past surgical history and problem list.  Review of Systems Pertinent items noted in HPI and remainder of comprehensive ROS otherwise negative.   Objective:   Blood pressure (!) 144/77, pulse 80, height 4\' 11"  (1.499 m), weight 132 lb 12.8 oz (60.2 kg). General appearance: alert and no distress Abdomen: soft, non-tender; bowel sounds normal; no masses,  no organomegaly Pelvic: external genitalia overall normal with some mild pallor of the labia minora.  Rectovaginal septum with moderate atrophy, possible lichenification, and 2 small healing fissures. Mild to moderate atrophy of the vagina.  No discharge or lesions.   Uterus and cervix surgically absent. Adnexae non-tender, nonpalpable.    Assessment:   1. Vaginal atrophy   2. Dyspareunia in female    Plan:   - Continue use of Premarin cream.  Can utilize internally (previously only using externally). Can also increase to 3 x daily.  - Continue use of lubricants for dyspareunia.  - F/u in 3-4 months. If still doing well, can then f/u yearly.    Rubie Maid, MD [facility]

## 2019-08-20 NOTE — Progress Notes (Signed)
Pt present for a follow up for vaginal atrophy. Pt stated that she has noticed improvement in her symptoms of vaginal atrophy.

## 2019-08-26 ENCOUNTER — Ambulatory Visit (INDEPENDENT_AMBULATORY_CARE_PROVIDER_SITE_OTHER): Payer: Medicare Other

## 2019-08-26 VITALS — Ht 59.0 in | Wt 132.0 lb

## 2019-08-26 DIAGNOSIS — Z0000 Encounter for general adult medical examination without abnormal findings: Secondary | ICD-10-CM

## 2019-08-26 DIAGNOSIS — Z1231 Encounter for screening mammogram for malignant neoplasm of breast: Secondary | ICD-10-CM | POA: Diagnosis not present

## 2019-08-26 NOTE — Progress Notes (Addendum)
Subjective:   Emily Ryan is a 74 y.o. female who presents for Medicare Annual (Subsequent) preventive examination.  Review of Systems    No ROS.  Medicare Wellness Virtual Visit.    Cardiac Risk Factors include: advanced age (>31men, >40 women);hypertension     Objective:    Today's Vitals   08/26/19 1303  Weight: 132 lb (59.9 kg)  Height: 4\' 11"  (1.499 m)   Body mass index is 26.66 kg/m.  Advanced Directives 08/26/2019 02/19/2018 02/12/2018 11/02/2017 09/06/2017 07/31/2017 08/25/2016  Does Patient Have a Medical Advance Directive? Yes No No No No No No  Does patient want to make changes to medical advance directive? - - - - - - Yes (MAU/Ambulatory/Procedural Areas - Information given)  Would patient like information on creating a medical advance directive? - No - Patient declined No - Patient declined No - Patient declined Yes (MAU/Ambulatory/Procedural Areas - Information given) No - Patient declined -    Current Medications (verified) Outpatient Encounter Medications as of 08/26/2019  Medication Sig   amLODipine (NORVASC) 2.5 MG tablet Take 1 tablet (2.5 mg total) by mouth daily.   Ascorbic Acid (VITAMIN C) 1000 MG tablet Take 1,000 mg by mouth daily.    aspirin 81 MG chewable tablet Chew 81 mg by mouth daily.    Calcium Carb-Cholecalciferol 600-100 MG-UNIT CAPS Take 1 tablet by mouth daily. Two by mouth daily (Patient taking differently: Take 1 tablet by mouth daily. )   calcium carbonate (OSCAL) 1500 (600 Ca) MG TABS tablet Take by mouth.   clobetasol cream (TEMOVATE) 0.05 % 2 (two) times daily as needed. As needed   conjugated estrogens (PREMARIN) vaginal cream Place 1 Applicatorful vaginally daily.   Dulaglutide (TRULICITY) 1.5 YS/0.6TK SOPN Inject 0.5 mLs (1.5 mg total) into the skin once a week.   escitalopram (LEXAPRO) 5 MG tablet Take 1 tablet (5 mg total) by mouth every evening.   ezetimibe (ZETIA) 10 MG tablet Take 1 tablet (10 mg total) by mouth daily.   folic  acid (FOLVITE) 1 MG tablet Take 1 tablet (1 mg total) by mouth See admin instructions. Takes daily except on Saturday   glipiZIDE (GLUCOTROL) 5 MG tablet Take 1 tablet (5 mg total) by mouth 2 (two) times daily before a meal.   glucose blood (FREESTYLE LITE) test strip Use as instructed to test blood sugar twice daily   hydrochlorothiazide (HYDRODIURIL) 12.5 MG tablet Take 1 tablet (12.5 mg total) by mouth daily.   loratadine (CLARITIN) 10 MG tablet Take 1 tablet (10 mg total) by mouth daily.   losartan (COZAAR) 100 MG tablet Take 1 tablet (100 mg total) by mouth daily.   metFORMIN (GLUCOPHAGE XR) 750 MG 24 hr tablet Take 1 tablet (750 mg total) by mouth daily with breakfast.   methotrexate (RHEUMATREX) 2.5 MG tablet Take 7.5 mg by mouth every Saturday.    montelukast (SINGULAIR) 10 MG tablet Take 1 tablet (10 mg total) by mouth at bedtime.   Multiple Vitamin (MULTIVITAMIN) capsule Take 1 capsule by mouth daily.     Omega-3 Fatty Acids (FISH OIL) 1000 MG CAPS Take 1 capsule by mouth daily.   omeprazole (PRILOSEC) 20 MG capsule Take 1 capsule (20 mg total) by mouth daily.   Precision Thin Lancets MISC Use as directed to check sugars twice daily   simvastatin (ZOCOR) 20 MG tablet Take 1 tablet (20 mg total) by mouth at bedtime.   vitamin E 400 UNIT capsule Take 400 Units by mouth daily.  No facility-administered encounter medications on file as of 08/26/2019.    Allergies (verified) Patient has no known allergies.   History: Past Medical History:  Diagnosis Date   Complicated pregnancy    1st pregnancy complicated by post operative hemorrhage and 2ng complicated by epidural   Diabetes mellitus    Diverticulosis of colon (without mention of hemorrhage)    Fracture of malleolus of right ankle, closed, with routine healing, subsequent encounter 12/09/2017   GERD (gastroesophageal reflux disease)    Guttate psoriasis    Hyperlipidemia    Hypertension    Menopausal disorder    Rectal  stricture    Skin cancer    Past Surgical History:  Procedure Laterality Date   ABDOMINAL HYSTERECTOMY     APPENDECTOMY  Dec 2012   BLADDER REPAIR  1991   lift    COLON RESECTION SIGMOID N/A 02/19/2018   Procedure: LAPAROSCOPIC SIGMOID COLECTOMY- POSSIBLE OPEN;  Surgeon: Olean Ree, MD;  Location: [facility] ORS;  Service: General;  Laterality: N/A;   COLONOSCOPY WITH PROPOFOL N/A 07/17/2017   Procedure: COLONOSCOPY WITH PROPOFOL;  Surgeon: Lin Landsman, MD;  Location: [facility] ENDOSCOPY;  Service: Gastroenterology;  Laterality: N/A;   COLONOSCOPY WITH PROPOFOL N/A 11/02/2017   Procedure: COLONOSCOPY WITH PROPOFOL;  Surgeon: Lin Landsman, MD;  Location: [facility] ENDOSCOPY;  Service: Gastroenterology;  Laterality: N/A;   CYSTOCELE REPAIR     CYSTOSCOPY W/ URETERAL STENT PLACEMENT Bilateral 02/19/2018   Procedure: CYSTOSCOPY WITH STENT PLACEMENT GOING 1ST;  Surgeon: Billey Co, MD;  Location: [facility] ORS;  Service: Urology;  Laterality: Bilateral;   FLEXIBLE SIGMOIDOSCOPY N/A 07/31/2017   Procedure: FLEXIBLE SIGMOIDOSCOPY;  Surgeon: Lin Landsman, MD;  Location: [facility] ENDOSCOPY;  Service: Gastroenterology;  Laterality: N/A;   HERNIA REPAIR     MOHS SURGERY N/A 05/2015   Face   PARTIAL COLECTOMY  Nov 2012   left, secondary to diverticular perf   RECTOCELE REPAIR     SEPTOPLASTY  1969   TEE WITHOUT CARDIOVERSION N/A 11/24/2015   Procedure: TRANSESOPHAGEAL ECHOCARDIOGRAM (TEE);  Surgeon: Minna Merritts, MD;  Location: [facility] ORS;  Service: Cardiovascular;  Laterality: N/A;   [place]   right leg    Family History  Problem Relation Age of Onset   Heart Problems Mother    Macular degeneration Mother    Cancer Father    Cancer Maternal Grandmother        colon ca   Diabetes Sister    Dementia Sister    Diabetes Brother    Stroke Brother    Diabetes Brother    Breast cancer Maternal Aunt    Breast cancer Paternal Aunt    Lung  cancer Cousin    Lung disease Neg Hx    Rheumatologic disease Neg Hx    Social History   Socioeconomic History   Marital status: Married    Spouse name: Not on file   Number of children: Not on file   Years of education: Not on file   Highest education level: Not on file  Occupational History   Not on file  Tobacco Use   Smoking status: Never Smoker   Smokeless tobacco: Never Used   Tobacco comment: father growing up  Vaping Use   Vaping Use: Never used  Substance and Sexual Activity   Alcohol use: Yes    Comment: Occasional   Drug use: No   Sexual activity: Yes    Birth control/protection:  Post-menopausal  Other Topics Concern   Not on file  Social History Narrative   [facility] (11/14/16):   She is originally from [place]. Previously has worked as a Network engineer, in [employer], & in [employer]. Her husband was in [employer] and they traveled extensively. She lived in [place], [place], [place], [place], [place], [place]. She has 2 cats currently. Remote parakeet exposure as a child. No mold exposure. No recent hot tub exposure.    Social Determinants of Health   Financial Resource Strain: Low Risk    Difficulty of Paying Living Expenses: Not hard at all  Food Insecurity: No Food Insecurity   Worried About Charity fundraiser in the Last Year: Never true   Kathryn in the Last Year: Never true  Transportation Needs: No Transportation Needs   Lack of Transportation (Medical): No   Lack of Transportation (Non-Medical): No  Physical Activity:    Days of Exercise per Week: Not on file   Minutes of Exercise per Session: Not on file  Stress: No Stress Concern Present   Feeling of Stress : Not at all  Social Connections: Socially Integrated   Frequency of Communication with Friends and Family: More than three times a week   Frequency of Social Gatherings with Friends and Family: More than three times a week   Attends Religious Services: More than 4 times per year   Active Member of  [employer] or Organizations: Yes   Attends Archivist Meetings: Not on file   Marital Status: Married    Tobacco Counseling Counseling given: Not Answered Comment: father growing up   Clinical Intake:  Pre-visit preparation completed: Yes           How often do you need to have someone help you when you read instructions, pamphlets, or other written materials from your doctor or pharmacy?: 1 - Never   Interpreter Needed?: No      Activities of Daily Living In your present state of health, do you have any difficulty performing the following activities: 08/26/2019  Hearing? N  Vision? N  Difficulty concentrating or making decisions? N  Walking or climbing stairs? N  Dressing or bathing? N  Doing errands, shopping? N  Preparing Food and eating ? N  Using the Toilet? N  In the past six months, have you accidently leaked urine? N  Do you have problems with loss of bowel control? N  Managing your Medications? N  Managing your Finances? N  Housekeeping or managing your Housekeeping? N  Some recent data might be hidden    Patient Care Team: Crecencio Mc, MD as PCP - General (Internal Medicine) Minna Merritts, MD as Consulting Physician (Cardiology) Lin Landsman, MD as Consulting Physician (Gastroenterology) Michael Boston, MD as Consulting Physician (General Surgery)  Indicate any recent Medical Services you may have received from other than [facility] providers in the past year (date may be approximate).     Assessment:   This is a routine wellness examination for [place].  I connected with [name] today by telephone and verified that I am speaking with the correct person using two identifiers. Location patient: home Location provider: work Persons participating in the virtual visit: patient, Marine scientist.    I discussed the limitations, risks, security and privacy concerns of performing an evaluation and management service by telephone and the availability of  in person appointments. The patient expressed understanding and verbally consented to this telephonic visit.  Interactive audio and video telecommunications were attempted between this provider and patient, however failed, due to patient having technical difficulties OR patient did not have access to video capability.  We continued and completed visit with audio only.  Some vital signs may be absent or patient reported.   Hearing/Vision screen  Hearing Screening   125Hz  250Hz  500Hz  1000Hz  2000Hz  3000Hz  4000Hz  6000Hz  8000Hz   Right ear:           Left ear:           Comments: Hearing aid, bilateral   Vision Screening Comments: Wears corrective lenses Visual acuity not assessed, virtual visit.  They have seen their ophthalmologist in the last 12 months.     Dietary issues and exercise activities discussed: Current Exercise Habits: Home exercise routine, Type of exercise: walking, Frequency (Times/Week): 3, Intensity: Mild  Goals      Follow up with pcp as needed       Depression Screen PHQ 2/9 Scores 08/26/2019 01/01/2019 09/06/2017 08/25/2016 08/26/2015 02/06/2015 01/28/2014  PHQ - 2 Score 0 0 0 0 0 0 0  PHQ- 9 Score - 0 - 0 - - -    Fall Risk Fall Risk  08/26/2019 04/11/2019 01/01/2019 03/14/2018 03/02/2018  Falls in the past year? 0 0 0 0 1  Number falls in past yr: 0 - - 0 0  Injury with Fall? - - - - 1  Comment - - - - -  Risk for fall due to : - - - - -  Follow up Falls evaluation completed Falls evaluation completed Falls evaluation completed Falls evaluation completed -   Handrails in use when climbing stairs?Yes  Home free of loose throw rugs in walkways, pet beds, electrical cords, etc? Yes  Adequate lighting in your home to reduce risk of falls? Yes   ASSISTIVE DEVICES IN THE HOME TO PREVENT FALLS:  Use of a cane, walker or w/c? No  Grab bars in the bathroom? Yes  Shower chair or bench in shower? Yes  Elevated toilet seat ? Yes   TIMED UP AND GO:  Was the test  performed? No . Virtual visit.  Cognitive Function: Patient is alert and oriented x3.  MMSE - Mini Mental State Exam 09/06/2017 08/25/2016 08/26/2015  Orientation to time 5 5 5   Orientation to Place 5 5 5   Registration 3 3 3   Attention/ Calculation 5 5 5   Recall 3 3 3   Language- name 2 objects 2 2 2   Language- repeat 1 1 1   Language- follow 3 step command 3 3 3   Language- read & follow direction 1 1 1   Write a sentence 1 1 1   Copy design 1 1 1   Total score 30 30 30         Immunizations Immunization History  Administered Date(s) Administered   Fluad Quad(high Dose 65+) 10/03/2018   Influenza Split 10/19/2011   Influenza, High Dose Seasonal PF 10/01/2014, 10/06/2016, 09/06/2017   Influenza,inj,Quad PF,6+ Mos 10/31/2012, 09/26/2013, 08/26/2015   PFIZER SARS-COV-2 Vaccination 02/12/2019, 03/05/2019   Pneumococcal Conjugate-13 01/28/2013   Pneumococcal Polysaccharide-23 11/02/2010, 05/04/2016   Tdap 11/16/2010   Zoster 10/19/2007   Zoster Recombinat (Shingrix) 06/25/2017, 09/06/2017    Health Maintenance Health Maintenance  Topic Date Due   FOOT EXAM  10/14/2019 (Originally 08/05/2017)   INFLUENZA VACCINE  11/26/2019 (Originally 08/04/2019)   MAMMOGRAM  09/20/2019   HEMOGLOBIN A1C  10/09/2019   OPHTHALMOLOGY EXAM  03/17/2020   COLONOSCOPY  11/02/2020   TETANUS/TDAP  11/15/2020  DEXA SCAN  Completed   COVID-19 Vaccine  Completed   Hepatitis C Screening  Completed   PNA vac Low Risk Adult  Completed   Foot exam- followed by pcp. Deferred.   Dental Screening: Recommended annual dental exams for proper oral hygiene. Visits every 6 months.   Community Resource Referral / Chronic Care Management: CRR required this visit?  No   CCM required this visit?  No      Plan:   Keep all routine maintenance appointments.   Next scheduled fasting lab 10/10/19   Follow up 10/14/19 @ 9:30  Mammogram ordered. Number provided for scheduling.   I have personally reviewed and noted  the following in the patient's chart:   Medical and social history Use of alcohol, tobacco or illicit drugs  Current medications and supplements Functional ability and status Nutritional status Physical activity Advanced directives List of other physicians Hospitalizations, surgeries, and ER visits in previous 12 months Vitals Screenings to include cognitive, depression, and falls Referrals and appointments  In addition, I have reviewed and discussed with patient certain preventive protocols, quality metrics, and best practice recommendations. A written personalized care plan for preventive services as well as general preventive health recommendations were provided to patient via mychart.     OBrien-Blaney, Moriyah Byington L, LPN   [id number]    I have reviewed the above information and agree with above.   Deborra Medina, MD

## 2019-08-26 NOTE — Patient Instructions (Addendum)
[name] , Thank you for taking time to come for your Medicare Wellness Visit. I appreciate your ongoing commitment to your health goals. Please review the following plan we discussed and let me know if I can assist you in the future.   These are the goals we discussed: Goals    . Follow up with pcp as needed       This is a list of the screening recommended for you and due dates:  Health Maintenance  Topic Date Due  . Complete foot exam   10/14/2019*  . Flu Shot  11/26/2019*  . Mammogram  09/20/2019  . Hemoglobin A1C  10/09/2019  . Eye exam for diabetics  03/17/2020  . Colon Cancer Screening  11/02/2020  . Tetanus Vaccine  11/15/2020  . DEXA scan (bone density measurement)  Completed  . COVID-19 Vaccine  Completed  .  Hepatitis C: One time screening is recommended by Center for Disease Control  (CDC) for  adults born from [birth year] through 1966.   Completed  . Pneumonia vaccines  Completed  *Topic was postponed. The date shown is not the original due date.    Immunizations Immunization History  Administered Date(s) Administered  . Fluad Quad(high Dose 65+) 10/03/2018  . Influenza Split 10/19/2011  . Influenza, High Dose Seasonal PF 10/01/2014, 10/06/2016, 09/06/2017  . Influenza,inj,Quad PF,6+ Mos 10/31/2012, 09/26/2013, 08/26/2015  . PFIZER SARS-COV-2 Vaccination 02/12/2019, 03/05/2019  . Pneumococcal Conjugate-13 01/28/2013  . Pneumococcal Polysaccharide-23 11/02/2010, 05/04/2016  . Tdap 11/16/2010  . Zoster 10/19/2007  . Zoster Recombinat (Shingrix) 06/25/2017, 09/06/2017   Keep all routine maintenance appointments.   Next scheduled fasting lab 10/10/19   Follow up 10/14/19 @ 9:30  Mammogram ordered. Number provided for scheduling.    Mammogram A mammogram is a low energy X-ray of the breasts that is done to check for abnormal changes. This procedure can screen for and detect any changes that may indicate breast cancer. Mammograms are regularly done on women. A  man may have a mammogram if he has a lump or swelling in his breast. A mammogram can also identify other changes and variations in the breast, such as:  Inflammation of the breast tissue (mastitis).  An infected area that contains a collection of pus (abscess).  A fluid-filled sac (cyst).  Fibrocystic changes. This is when breast tissue becomes denser, which can make the tissue feel rope-like or uneven under the skin.  Tumors that are not cancerous (benign). Tell a health care provider:  About any allergies you have.  If you have breast implants.  If you have had previous breast disease, biopsy, or surgery.  If you are breastfeeding.  If you are younger than age 3.  If you have a family history of breast cancer.  Whether you are pregnant or may be pregnant. What are the risks? Generally, this is a safe procedure. However, problems may occur, including:  Exposure to radiation. Radiation levels are very low with this test.  The results being misinterpreted.  The need for further tests.  The inability of the mammogram to detect certain cancers. What happens before the procedure?  Schedule your test about 1-2 weeks after your menstrual period if you are still menstruating. This is usually when your breasts are the least tender.  If you have had a mammogram done at a different facility in the past, get the mammogram X-rays or have them sent to your current exam facility. The new and old images will be compared.  Wash your  breasts and underarms on the day of the test.  Do not wear deodorants, perfumes, lotions, or powders anywhere on your body on the day of the test.  Remove any jewelry from your neck.  Wear clothes that you can change into and out of easily. What happens during the procedure?   You will undress from the waist up and put on a gown that opens in the front.  You will stand in front of the X-ray machine.  Each breast will be placed between two plastic  or glass plates. The plates will compress your breast for a few seconds. Try to stay as relaxed as possible during the procedure. This does not cause any harm to your breasts and any discomfort you feel will be very brief.  X-rays will be taken from different angles of each breast. The procedure may vary among health care providers and hospitals. What happens after the procedure?  The mammogram will be examined by a specialist (radiologist).  You may need to repeat certain parts of the test, depending on the quality of the images. This is commonly done if the radiologist needs a better view of the breast tissue.  You may resume your normal activities.  It is up to you to get the results of your procedure. Ask your health care provider, or the department that is doing the procedure, when your results will be ready. Summary  A mammogram is a low energy X-ray of the breasts that is done to check for abnormal changes. A man may have a mammogram if he has a lump or swelling in his breast.  If you have had a mammogram done at a different facility in the past, get the mammogram X-rays or have them sent to your current exam facility in order to compare them.  Schedule your test about 1-2 weeks after your menstrual period if you are still menstruating.  For this test, each breast will be placed between two plastic or glass plates. The plates will compress your breast for a few seconds.  Ask when your test results will be ready. Make sure you get your test results. This information is not intended to replace advice given to you by your health care provider. Make sure you discuss any questions you have with your health care provider. Document Revised: 08/10/2017 Document Reviewed: 08/10/2017 Elsevier Patient Education  [place].

## 2019-08-27 ENCOUNTER — Other Ambulatory Visit: Payer: Self-pay | Admitting: Internal Medicine

## 2019-08-27 DIAGNOSIS — Z1231 Encounter for screening mammogram for malignant neoplasm of breast: Secondary | ICD-10-CM

## 2019-09-25 ENCOUNTER — Other Ambulatory Visit: Payer: Self-pay

## 2019-09-25 ENCOUNTER — Ambulatory Visit
Admission: RE | Admit: 2019-09-25 | Discharge: 2019-09-25 | Disposition: A | Discharge status: Home / Self Care | Payer: Medicare Other | Source: Ambulatory Visit | Attending: Internal Medicine | Admitting: Internal Medicine

## 2019-09-25 DIAGNOSIS — Z1231 Encounter for screening mammogram for malignant neoplasm of breast: Secondary | ICD-10-CM

## 2019-09-27 ENCOUNTER — Other Ambulatory Visit: Payer: Self-pay

## 2019-09-27 MED ORDER — LORATADINE 10 MG PO TABS
10.0000 mg | ORAL_TABLET | Freq: Every day | ORAL | 1 refills | Status: DC
Start: 2019-09-27 — End: 2020-01-17

## 2019-09-27 MED ORDER — EZETIMIBE 10 MG PO TABS
10.0000 mg | ORAL_TABLET | Freq: Every day | ORAL | 1 refills | Status: DC
Start: 2019-09-27 — End: 2020-01-17

## 2019-09-27 MED ORDER — OMEPRAZOLE 20 MG PO CPDR: 20.0000 mg | DELAYED_RELEASE_CAPSULE | Freq: Every day | ORAL | 1 refills | Status: DC

## 2019-10-02 ENCOUNTER — Encounter: Payer: Self-pay | Admitting: Internal Medicine

## 2019-10-04 DIAGNOSIS — Z23 Encounter for immunization: Secondary | ICD-10-CM | POA: Diagnosis not present

## 2019-10-09 DIAGNOSIS — Z23 Encounter for immunization: Secondary | ICD-10-CM | POA: Diagnosis not present

## 2019-10-10 ENCOUNTER — Other Ambulatory Visit (INDEPENDENT_AMBULATORY_CARE_PROVIDER_SITE_OTHER): Payer: Medicare Other

## 2019-10-10 ENCOUNTER — Other Ambulatory Visit: Payer: Self-pay

## 2019-10-10 DIAGNOSIS — E119 Type 2 diabetes mellitus without complications: Secondary | ICD-10-CM

## 2019-10-10 DIAGNOSIS — E782 Mixed hyperlipidemia: Secondary | ICD-10-CM | POA: Diagnosis not present

## 2019-10-10 DIAGNOSIS — I10 Essential (primary) hypertension: Secondary | ICD-10-CM

## 2019-10-10 LAB — MICROALBUMIN / CREATININE URINE RATIO
Creatinine,U: 178.7 mg/dL
Microalb Creat Ratio: 0.9 mg/g (ref 0.0–30.0)
Microalb, Ur: 1.5 mg/dL (ref 0.0–1.9)

## 2019-10-10 LAB — COMPREHENSIVE METABOLIC PANEL
ALT: 34 U/L (ref 0–35)
AST: 25 U/L (ref 0–37)
Albumin: 4 g/dL (ref 3.5–5.2)
Alkaline Phosphatase: 72 U/L (ref 39–117)
BUN: 12 mg/dL (ref 6–23)
CO2: 29 mEq/L (ref 19–32)
Calcium: 8.6 mg/dL (ref 8.4–10.5)
Chloride: 102 mEq/L (ref 96–112)
Creatinine, Ser: 0.58 mg/dL (ref 0.40–1.20)
GFR: 90.2 mL/min (ref >60.00)
Glucose, Bld: 133 mg/dL — ABNORMAL HIGH (ref 70–99)
Potassium: 4.1 mEq/L (ref 3.5–5.1)
Sodium: 138 mEq/L (ref 135–145)
Total Bilirubin: 0.8 mg/dL (ref 0.2–1.2)
Total Protein: 6.4 g/dL (ref 6.0–8.3)

## 2019-10-10 LAB — LIPID PANEL
Cholesterol: 133 mg/dL (ref 0–200)
HDL: 51.1 mg/dL (ref >39.00)
LDL Cholesterol: 58 mg/dL (ref 0–99)
NonHDL: 81.79
Total CHOL/HDL Ratio: 3
Triglycerides: 118 mg/dL (ref 0.0–149.0)
VLDL: 23.6 mg/dL (ref 0.0–40.0)

## 2019-10-10 LAB — HEMOGLOBIN A1C: Hgb A1c MFr Bld: 8 % — ABNORMAL HIGH (ref 4.6–6.5)

## 2019-10-13 NOTE — Progress Notes (Signed)
Attached are your recent fasting labs,  We will discuss in detail at your upcoming visit.  Regards,   Daeveon Zweber, MD    

## 2019-10-14 ENCOUNTER — Ambulatory Visit (INDEPENDENT_AMBULATORY_CARE_PROVIDER_SITE_OTHER): Payer: Medicare Other | Admitting: Internal Medicine

## 2019-10-14 ENCOUNTER — Other Ambulatory Visit: Payer: Self-pay

## 2019-10-14 ENCOUNTER — Encounter: Payer: Self-pay | Admitting: Internal Medicine

## 2019-10-14 VITALS — BP 150/86 | HR 85 | Temp 98.8°F | Resp 15 | Ht 59.0 in | Wt 135.8 lb

## 2019-10-14 DIAGNOSIS — I700 Atherosclerosis of aorta: Secondary | ICD-10-CM | POA: Diagnosis not present

## 2019-10-14 DIAGNOSIS — I10 Essential (primary) hypertension: Secondary | ICD-10-CM

## 2019-10-14 DIAGNOSIS — E119 Type 2 diabetes mellitus without complications: Secondary | ICD-10-CM | POA: Diagnosis not present

## 2019-10-14 DIAGNOSIS — G8929 Other chronic pain: Secondary | ICD-10-CM

## 2019-10-14 DIAGNOSIS — E782 Mixed hyperlipidemia: Secondary | ICD-10-CM | POA: Diagnosis not present

## 2019-10-14 DIAGNOSIS — Z7184 Encounter for health counseling related to travel: Secondary | ICD-10-CM

## 2019-10-14 DIAGNOSIS — R635 Abnormal weight gain: Secondary | ICD-10-CM

## 2019-10-14 DIAGNOSIS — R296 Repeated falls: Secondary | ICD-10-CM | POA: Insufficient documentation

## 2019-10-14 DIAGNOSIS — M25511 Pain in right shoulder: Secondary | ICD-10-CM

## 2019-10-14 DIAGNOSIS — Y92009 Unspecified place in unspecified non-institutional (private) residence as the place of occurrence of the external cause: Secondary | ICD-10-CM

## 2019-10-14 DIAGNOSIS — W19XXXA Unspecified fall, initial encounter: Secondary | ICD-10-CM | POA: Diagnosis not present

## 2019-10-14 MED ORDER — GLIPIZIDE ER 5 MG PO TB24: 5.0000 mg | ORAL_TABLET | Freq: Every day | ORAL | 1 refills | Status: DC

## 2019-10-14 MED ORDER — CIPROFLOXACIN HCL 250 MG PO TABS: 250.0000 mg | ORAL_TABLET | Freq: Two times a day (BID) | ORAL | 0 refills | Status: DC

## 2019-10-14 MED ORDER — OSELTAMIVIR PHOSPHATE 75 MG PO CAPS: 75.0000 mg | ORAL_CAPSULE | Freq: Two times a day (BID) | ORAL | 0 refills | Status: DC

## 2019-10-14 MED ORDER — AZITHROMYCIN 250 MG PO TABS: ORAL_TABLET | ORAL | 0 refills | Status: DC

## 2019-10-14 MED ORDER — PREDNISONE 10 MG PO TABS: ORAL_TABLET | ORAL | 0 refills | Status: DC

## 2019-10-14 MED ORDER — METFORMIN HCL ER 750 MG PO TB24
1500.0000 mg | ORAL_TABLET | Freq: Every day | ORAL | 1 refills | Status: DC
Start: 2019-10-14 — End: 2020-04-22

## 2019-10-14 NOTE — Patient Instructions (Addendum)
Referral to Dr Roland Rack for shoulder pain evaluation   Please check your BP once a week and send me readings in [redacted] weeks along with your BS readings   I am increasing your metformin to 2 tablets daily in the morning.  If this makes your bowels too loose,  Go back to one tablet daily   I am changing your glipizide to a once daily  Form of the same strength  tablet to take in the morning.  If your blood sugars do not improve after 2-3 weeks of this regimen, let me know because we can increase the glipize dose to 10 mg once daily    For your cruise, I have prescribed the following medications to take with you:  Cipro 250 mg twice daily for  Urinary tract infection ( 3 days for you,  14 days for Nicole Kindred)   Tamiflu and  Azithromycin:  Take for flu/COVID symptoms.  Add prednisone if you develop a cough.

## 2019-10-14 NOTE — Assessment & Plan Note (Signed)
She is not orthostatic on exam today.  Bruises examined, no broken bones. Addressed need to have well lit pathway to bathroom and to rest on edge of bed before getting up

## 2019-10-14 NOTE — Progress Notes (Signed)
Subjective:  Patient ID: Emily Ryan, female    DOB: 08/02/1945  Age: 74 y.o. MRN: [id number]  CC: The primary encounter diagnosis was Chronic right shoulder pain. Diagnoses of Diabetes mellitus without complication ([place]), Fall at home, initial encounter, Weight gain, Atherosclerosis of aorta ([place]), White coat syndrome with diagnosis of hypertension, Mixed hyperlipidemia, and Travel advice encounter were also pertinent to this visit.  HPI UNIQUE SILLAS presents for follow up on type 2 DM.  Hyperlipidemia and hypertension  This visit occurred during the SARS-CoV-2 public health emergency.  Safety protocols were in place, including screening questions prior to the visit, additional usage of staff PPE, and extensive cleaning of exam room while observing appropriate contact time as indicated for disinfecting solutions.    Patient has received both doses of the available COVID 19 vaccine without complications.  Patient continues to mask when outside of the home except when walking in yard or at safe distances from others .  Patient denies any change in mood or development of unhealthy behaviors resuting from the pandemic's restriction of activities and socialization.     Had a fall during the night when getting up to use the bathroom.  Bruises on right lateral abdomen  Left shoulder and right axillary posterior  upper arm (hit desk  Few things on the way down)s.  States that she often feels off balance when she wakes up In the night .  No nocturia on a regular basis   Type 2 DM:  bs have been elevated and a1c is now 8.0. admits to eating more off diet .  Has had some weight gain  (3 lbs) . Fasting BS was higher than usual,  190 today.    Most FS  Are 140 or less.  forgetting to take the 2nd dose of glipizide  Right arm weakness and diffuse pain in the upper arm for the last several months   .  Hurting enough to impact activities: no longer kayaking Discussed referral to [facility]  Family  history of hypothyroidism in sister and mother.  Last screen was 2017  Lab Results  Component Value Date   TSH 1.62 02/04/2015    Going on a cruise in December / wants to take prophylactic meds with her   Reviewed findings of elastrogram today for fatty liver .  Patient is taking  Simvastatin     Outpatient Medications Prior to Visit  Medication Sig Dispense Refill  . amLODipine (NORVASC) 2.5 MG tablet Take 1 tablet (2.5 mg total) by mouth daily. 90 tablet 1  . Ascorbic Acid (VITAMIN C) 1000 MG tablet Take 1,000 mg by mouth daily.     Marland Kitchen aspirin 81 MG chewable tablet Chew 81 mg by mouth daily.     . Calcium Carb-Cholecalciferol 600-100 MG-UNIT CAPS Take 1 tablet by mouth daily. Two by mouth daily (Patient taking differently: Take 1 tablet by mouth daily. ) 90 capsule 3  . calcium carbonate (OSCAL) 1500 (600 Ca) MG TABS tablet Take by mouth.    . clobetasol cream (TEMOVATE) 0.05 % 2 (two) times daily as needed. As needed    . conjugated estrogens (PREMARIN) vaginal cream Place 1 Applicatorful vaginally daily. 42.5 g 3  . Dulaglutide (TRULICITY) 1.5 WU/9.8JX SOPN Inject 0.5 mLs (1.5 mg total) into the skin once a week. 4 pen 11  . escitalopram (LEXAPRO) 5 MG tablet Take 1 tablet (5 mg total) by mouth every evening. 90 tablet 3  . ezetimibe (ZETIA) 10 MG tablet  Take 1 tablet (10 mg total) by mouth daily. 90 tablet 1  . folic acid (FOLVITE) 1 MG tablet Take 1 tablet (1 mg total) by mouth See admin instructions. Takes daily except on Saturday 90 tablet 3  . glucose blood (FREESTYLE LITE) test strip Use as instructed to test blood sugar twice daily 200 each 3  . hydrochlorothiazide (HYDRODIURIL) 12.5 MG tablet Take 1 tablet (12.5 mg total) by mouth daily. 90 tablet 3  . loratadine (CLARITIN) 10 MG tablet Take 1 tablet (10 mg total) by mouth daily. 90 tablet 1  . losartan (COZAAR) 100 MG tablet Take 1 tablet (100 mg total) by mouth daily. 90 tablet 3  . methotrexate (RHEUMATREX) 2.5 MG tablet  Take 7.5 mg by mouth every Saturday.     . montelukast (SINGULAIR) 10 MG tablet Take 1 tablet (10 mg total) by mouth at bedtime. 90 tablet 3  . Multiple Vitamin (MULTIVITAMIN) capsule Take 1 capsule by mouth daily.      . Omega-3 Fatty Acids (FISH OIL) 1000 MG CAPS Take 1 capsule by mouth daily.    Marland Kitchen omeprazole (PRILOSEC) 20 MG capsule Take 1 capsule (20 mg total) by mouth daily. 90 capsule 1  . Precision Thin Lancets MISC Use as directed to check sugars twice daily 180 each 3  . simvastatin (ZOCOR) 20 MG tablet Take 1 tablet (20 mg total) by mouth at bedtime. 90 tablet 3  . vitamin E 400 UNIT capsule Take 400 Units by mouth daily.      Marland Kitchen glipiZIDE (GLUCOTROL) 5 MG tablet Take 1 tablet (5 mg total) by mouth 2 (two) times daily before a meal. 180 tablet 3  . metFORMIN (GLUCOPHAGE XR) 750 MG 24 hr tablet Take 1 tablet (750 mg total) by mouth daily with breakfast. 90 tablet 3   No facility-administered medications prior to visit.    Review of Systems;  Patient denies headache, fevers, malaise, unintentional weight loss, skin rash, eye pain, sinus congestion and sinus pain, sore throat, dysphagia,  hemoptysis , cough, dyspnea, wheezing, chest pain, palpitations, orthopnea, edema, abdominal pain, nausea, melena, diarrhea, constipation, flank pain, dysuria, hematuria, urinary  Frequency, nocturia, numbness, tingling, seizures,  Focal weakness, Loss of consciousness,  Tremor, insomnia, depression, anxiety, and suicidal ideation.      Objective:  BP (!) 150/86 (BP Location: Left Arm, Patient Position: Sitting, Cuff Size: Normal)   Pulse 85   Temp 98.8 F (37.1 C) (Oral)   Resp 15   Ht 4\' 11"  (1.499 m)   Wt 135 lb 12.8 oz (61.6 kg)   SpO2 96%   BMI 27.43 kg/m   BP Readings from Last 3 Encounters:  10/14/19 (!) 150/86  08/20/19 (!) 144/77  06/11/19 123/73    Wt Readings from Last 3 Encounters:  10/14/19 135 lb 12.8 oz (61.6 kg)  08/26/19 132 lb (59.9 kg)  08/20/19 132 lb 12.8 oz (60.2  kg)    General appearance: alert, cooperative and appears stated age Ears: normal TM's and external ear canals both ears Throat: lips, mucosa, and tongue normal; teeth and gums normal Neck: no adenopathy, no carotid bruit, supple, symmetrical, trachea midline and thyroid not enlarged, symmetric, no tenderness/mass/nodules Back: symmetric, no curvature. ROM normal. No CVA tenderness. Lungs: clear to auscultation bilaterally Heart: regular rate and rhythm, S1, S2 normal, no murmur, click, rub or gallop Abdomen: soft, non-tender; bowel sounds normal; no masses,  no organomegaly MSK  RUE strength is 4+5 , (bicpes and triceps weakness noted ) decreased ROM  above the horizontal.  Strength 4+/5 proximally,  5/5 distally. No thenar wasting  Pulses: 2+ and symmetric Skin: Skin color, texture, turgor normal. No rashes or lesions Lymph nodes: Cervical, supraclavicular, and axillary nodes normal.  Lab Results  Component Value Date   HGBA1C 8.0 (H) 10/10/2019   HGBA1C 7.0 (H) 04/09/2019   HGBA1C 8.1 (H) 12/10/2018    Lab Results  Component Value Date   CREATININE 0.58 10/10/2019   CREATININE 0.71 04/09/2019   CREATININE 0.64 12/10/2018    Lab Results  Component Value Date   WBC 5.6 12/10/2018   HGB 14.2 12/10/2018   HCT 42.2 12/10/2018   PLT 222.0 12/10/2018   GLUCOSE 133 (H) 10/10/2019   CHOL 133 10/10/2019   TRIG 118.0 10/10/2019   HDL 51.10 10/10/2019   LDLDIRECT 72.0 04/12/2018   LDLCALC 58 10/10/2019   ALT 34 10/10/2019   AST 25 10/10/2019   NA 138 10/10/2019   K 4.1 10/10/2019   CL 102 10/10/2019   CREATININE 0.58 10/10/2019   BUN 12 10/10/2019   CO2 29 10/10/2019   TSH 1.62 02/04/2015   HGBA1C 8.0 (H) 10/10/2019   MICROALBUR 1.5 10/10/2019    MM 3D SCREEN BREAST BILATERAL  Result Date: 09/26/2019 CLINICAL DATA:  Screening. EXAM: DIGITAL SCREENING BILATERAL MAMMOGRAM WITH TOMO AND CAD COMPARISON:  Previous exam(s). ACR Breast Density Category c: The breast tissue is  heterogeneously dense, which may obscure small masses. FINDINGS: There are no findings suspicious for malignancy. Images were processed with CAD. IMPRESSION: No mammographic evidence of malignancy. A result letter of this screening mammogram will be mailed directly to the patient. RECOMMENDATION: Screening mammogram in one year. (Code:SM-B-01Y) BI-RADS CATEGORY  1: Negative. Electronically Signed   By: Claudie Revering M.D.   On: 09/26/2019 11:03    Assessment & Plan:   Problem List Items Addressed This Visit      Unprioritized   Atherosclerosis of aorta ([place])   Diabetes mellitus without complication ([place])    Loss of control noted.  Will increase metformin dose and change glipzide to xr dosing  5mg   .continue Trulicity    Will increase glipizide  to 10 mg based on new readings in 3-4 weeks.  Continue losartan and simvastatin  Lab Results  Component Value Date   HGBA1C 8.0 (H) 10/10/2019   Lab Results  Component Value Date   MICROALBUR 1.5 10/10/2019   MICROALBUR <0.7 08/08/2018           Relevant Medications   glipiZIDE (GLUCOTROL XL) 5 MG 24 hr tablet   metFORMIN (GLUCOPHAGE XR) 750 MG 24 hr tablet   Other Relevant Orders   Hemoglobin A1c   Comprehensive metabolic panel   Lipid panel   Fall at home, initial encounter    She is not orthostatic on exam today.  Bruises examined, no broken bones. Addressed need to have well lit pathway to bathroom and to rest on edge of bed before getting up      Hyperlipidemia    LDL is < 100 and triglycerides are at goal on simvastatin/zetia . She has no side effects and LFTs are normal since losing weight (hepatic steatosis hx). No changes today. Continue aspirin low dose.  Lab Results  Component Value Date   CHOL 133 10/10/2019   HDL 51.10 10/10/2019   LDLCALC 58 10/10/2019   LDLDIRECT 72.0 04/12/2018   TRIG 118.0 10/10/2019   CHOLHDL 3 10/10/2019   Lab Results  Component Value Date   ALT 34 10/10/2019  AST 25 10/10/2019   ALKPHOS 72  10/10/2019   BILITOT 0.8 10/10/2019          Travel advice encounter    Risks of COVID/FLU infection discussed for upcoming cruise.  Tamiflu, prednisone azithromycin and cipro rxs given to patient .  She is up to date on vaccinations       White coat syndrome with diagnosis of hypertension    Well controlled based on home reading  on current regimen of losartan 100 mg daily and amlodipine 2.5 mg daily  . Renal function stable, no changes today.       Other Visit Diagnoses    Chronic right shoulder pain    -  Primary   Relevant Medications   predniSONE (DELTASONE) 10 MG tablet   Other Relevant Orders   Ambulatory referral to Orthopedic Surgery   Weight gain       Relevant Orders   TSH    A total of 40 minutes was spent with patient more than half of which was spent in counseling patient on the above mentioned issues , reviewing and explaining recent labs and imaging studies done, and coordination of care.  I have discontinued [name] A. Lust's glipiZIDE. I have also changed her metFORMIN. Additionally, I am having her start on glipiZIDE, oseltamivir, predniSONE, and azithromycin. Lastly, I am having her maintain her vitamin E, multivitamin, methotrexate, Precision Thin Lancets, Calcium Carb-Cholecalciferol, aspirin, vitamin C, Fish Oil, FREESTYLE LITE, montelukast, hydrochlorothiazide, simvastatin, escitalopram, losartan, folic acid, calcium carbonate, clobetasol cream, Premarin, Trulicity, amLODipine, omeprazole, loratadine, ezetimibe, and ciprofloxacin.  Meds ordered this encounter  Medications  . glipiZIDE (GLUCOTROL XL) 5 MG 24 hr tablet    Sig: Take 1 tablet (5 mg total) by mouth daily with breakfast.    Dispense:  90 tablet    Refill:  1  . metFORMIN (GLUCOPHAGE XR) 750 MG 24 hr tablet    Sig: Take 2 tablets (1,500 mg total) by mouth daily with breakfast.    Dispense:  180 tablet    Refill:  1  . oseltamivir (TAMIFLU) 75 MG capsule    Sig: Take 1 capsule (75 mg total)  by mouth 2 (two) times daily.    Dispense:  10 capsule    Refill:  0  . predniSONE (DELTASONE) 10 MG tablet    Sig: 6 tablets on Day 1 , then reduce by 1 tablet daily until gone    Dispense:  21 tablet    Refill:  0  . DISCONTD: ciprofloxacin (CIPRO) 250 MG tablet    Sig: Take 1 tablet (250 mg total) by mouth 2 (two) times daily.    Dispense:  6 tablet    Refill:  0  . ciprofloxacin (CIPRO) 250 MG tablet    Sig: Take 1 tablet (250 mg total) by mouth 2 (two) times daily.    Dispense:  28 tablet    Refill:  0    Disregard previous qty of 6  . azithromycin (ZITHROMAX) 250 MG tablet    Sig: 2 tablets one day 1,  One tablet daily until gone    Dispense:  6 tablet    Refill:  0    Medications Discontinued During This Encounter  Medication Reason  . glipiZIDE (GLUCOTROL) 5 MG tablet   . metFORMIN (GLUCOPHAGE XR) 750 MG 24 hr tablet   . ciprofloxacin (CIPRO) 250 MG tablet     Follow-up: Return in about 3 months (around 01/14/2020) for follow up diabetes.   Aris Everts  Derrel Nip, MD

## 2019-10-15 DIAGNOSIS — Z7184 Encounter for health counseling related to travel: Secondary | ICD-10-CM | POA: Insufficient documentation

## 2019-10-15 NOTE — Assessment & Plan Note (Signed)
LDL is < 100 and triglycerides are at goal on simvastatin/zetia . She has no side effects and LFTs are normal since losing weight (hepatic steatosis hx). No changes today. Continue aspirin low dose.  Lab Results  Component Value Date   CHOL 133 10/10/2019   HDL 51.10 10/10/2019   LDLCALC 58 10/10/2019   LDLDIRECT 72.0 04/12/2018   TRIG 118.0 10/10/2019   CHOLHDL 3 10/10/2019   Lab Results  Component Value Date   ALT 34 10/10/2019   AST 25 10/10/2019   ALKPHOS 72 10/10/2019   BILITOT 0.8 10/10/2019

## 2019-10-15 NOTE — Assessment & Plan Note (Addendum)
Loss of control noted.  Will increase metformin dose and change glipzide to xr dosing  5mg   .continue Trulicity    Will increase glipizide  to 10 mg based on new readings in 3-4 weeks.  Continue losartan and simvastatin  Lab Results  Component Value Date   HGBA1C 8.0 (H) 10/10/2019   Lab Results  Component Value Date   MICROALBUR 1.5 10/10/2019   MICROALBUR <0.7 08/08/2018

## 2019-10-15 NOTE — Assessment & Plan Note (Signed)
Well controlled based on home reading  on current regimen of losartan 100 mg daily and amlodipine 2.5 mg daily  . Renal function stable, no changes today.

## 2019-10-15 NOTE — Assessment & Plan Note (Signed)
Risks of COVID/FLU infection discussed for upcoming cruise.  Tamiflu, prednisone azithromycin and cipro rxs given to patient .  She is up to date on vaccinations

## 2019-11-08 ENCOUNTER — Other Ambulatory Visit: Payer: Self-pay | Admitting: Surgery

## 2019-11-08 ENCOUNTER — Other Ambulatory Visit (HOSPITAL_COMMUNITY): Payer: Self-pay | Admitting: Surgery

## 2019-11-08 DIAGNOSIS — M7581 Other shoulder lesions, right shoulder: Secondary | ICD-10-CM | POA: Diagnosis not present

## 2019-11-08 DIAGNOSIS — M25511 Pain in right shoulder: Secondary | ICD-10-CM | POA: Diagnosis not present

## 2019-11-08 DIAGNOSIS — G8929 Other chronic pain: Secondary | ICD-10-CM | POA: Diagnosis not present

## 2019-11-08 DIAGNOSIS — M75121 Complete rotator cuff tear or rupture of right shoulder, not specified as traumatic: Secondary | ICD-10-CM

## 2019-11-21 ENCOUNTER — Ambulatory Visit
Admission: RE | Admit: 2019-11-21 | Discharge: 2019-11-21 | Disposition: A | Discharge status: Home / Self Care | Payer: Medicare Other | Source: Ambulatory Visit | Attending: Surgery | Admitting: Surgery

## 2019-11-21 ENCOUNTER — Other Ambulatory Visit: Payer: Self-pay

## 2019-11-21 ENCOUNTER — Other Ambulatory Visit: Payer: Self-pay | Admitting: Internal Medicine

## 2019-11-21 DIAGNOSIS — M7581 Other shoulder lesions, right shoulder: Secondary | ICD-10-CM | POA: Diagnosis not present

## 2019-11-21 DIAGNOSIS — M25511 Pain in right shoulder: Secondary | ICD-10-CM | POA: Diagnosis not present

## 2019-11-21 DIAGNOSIS — M75121 Complete rotator cuff tear or rupture of right shoulder, not specified as traumatic: Secondary | ICD-10-CM | POA: Diagnosis not present

## 2019-11-21 MED ORDER — OSELTAMIVIR PHOSPHATE 75 MG PO CAPS: 75.0000 mg | ORAL_CAPSULE | Freq: Two times a day (BID) | ORAL | 0 refills | Status: DC

## 2019-11-21 MED ORDER — CIPROFLOXACIN HCL 250 MG PO TABS: 250.0000 mg | ORAL_TABLET | Freq: Two times a day (BID) | ORAL | 0 refills | Status: DC

## 2019-11-21 MED ORDER — AZITHROMYCIN 250 MG PO TABS: ORAL_TABLET | ORAL | 0 refills | Status: DC

## 2019-11-22 DIAGNOSIS — Z09 Encounter for follow-up examination after completed treatment for conditions other than malignant neoplasm: Secondary | ICD-10-CM | POA: Diagnosis not present

## 2019-11-22 DIAGNOSIS — Z08 Encounter for follow-up examination after completed treatment for malignant neoplasm: Secondary | ICD-10-CM | POA: Diagnosis not present

## 2019-11-22 DIAGNOSIS — L400 Psoriasis vulgaris: Secondary | ICD-10-CM | POA: Diagnosis not present

## 2019-11-22 DIAGNOSIS — Z872 Personal history of diseases of the skin and subcutaneous tissue: Secondary | ICD-10-CM | POA: Diagnosis not present

## 2019-11-22 DIAGNOSIS — D485 Neoplasm of uncertain behavior of skin: Secondary | ICD-10-CM | POA: Diagnosis not present

## 2019-11-22 DIAGNOSIS — C44519 Basal cell carcinoma of skin of other part of trunk: Secondary | ICD-10-CM | POA: Diagnosis not present

## 2019-11-22 DIAGNOSIS — Z85828 Personal history of other malignant neoplasm of skin: Secondary | ICD-10-CM | POA: Diagnosis not present

## 2019-11-25 DIAGNOSIS — M12811 Other specific arthropathies, not elsewhere classified, right shoulder: Secondary | ICD-10-CM | POA: Diagnosis not present

## 2019-11-25 DIAGNOSIS — M75121 Complete rotator cuff tear or rupture of right shoulder, not specified as traumatic: Secondary | ICD-10-CM | POA: Diagnosis not present

## 2019-11-25 DIAGNOSIS — M7581 Other shoulder lesions, right shoulder: Secondary | ICD-10-CM | POA: Diagnosis not present

## 2019-12-10 ENCOUNTER — Encounter: Payer: Medicare Other | Admitting: Obstetrics and Gynecology

## 2019-12-10 DIAGNOSIS — M25511 Pain in right shoulder: Secondary | ICD-10-CM | POA: Diagnosis not present

## 2019-12-11 DIAGNOSIS — M25511 Pain in right shoulder: Secondary | ICD-10-CM | POA: Diagnosis not present

## 2019-12-12 MED ORDER — HYDROCHLOROTHIAZIDE 12.5 MG PO TABS
12.5000 mg | ORAL_TABLET | Freq: Every day | ORAL | 3 refills | Status: DC
Start: 2019-12-12 — End: 2020-11-03

## 2019-12-12 MED ORDER — MONTELUKAST SODIUM 10 MG PO TABS
10.0000 mg | ORAL_TABLET | Freq: Every day | ORAL | 3 refills | Status: DC
Start: 2019-12-12 — End: 2020-11-03

## 2019-12-12 MED ORDER — SIMVASTATIN 20 MG PO TABS
20.0000 mg | ORAL_TABLET | Freq: Every day | ORAL | 3 refills | Status: DC
Start: 2019-12-12 — End: 2020-08-19

## 2019-12-14 ENCOUNTER — Other Ambulatory Visit: Payer: Self-pay | Admitting: Internal Medicine

## 2019-12-16 DIAGNOSIS — M25511 Pain in right shoulder: Secondary | ICD-10-CM | POA: Diagnosis not present

## 2019-12-18 DIAGNOSIS — M25511 Pain in right shoulder: Secondary | ICD-10-CM | POA: Diagnosis not present

## 2019-12-20 ENCOUNTER — Other Ambulatory Visit: Payer: Self-pay

## 2019-12-20 ENCOUNTER — Ambulatory Visit (INDEPENDENT_AMBULATORY_CARE_PROVIDER_SITE_OTHER): Payer: Medicare Other | Admitting: Obstetrics and Gynecology

## 2019-12-20 ENCOUNTER — Encounter: Payer: Self-pay | Admitting: Obstetrics and Gynecology

## 2019-12-20 VITALS — BP 146/80 | HR 85 | Ht 59.0 in | Wt 135.4 lb

## 2019-12-20 DIAGNOSIS — N941 Unspecified dyspareunia: Secondary | ICD-10-CM | POA: Diagnosis not present

## 2019-12-20 DIAGNOSIS — N952 Postmenopausal atrophic vaginitis: Secondary | ICD-10-CM | POA: Diagnosis not present

## 2019-12-20 NOTE — Progress Notes (Signed)
Follow up appt for vaginal atrophy. Pt stated noticing some improvement in her symptoms.

## 2019-12-20 NOTE — Progress Notes (Signed)
    GYNECOLOGY PROGRESS NOTE  Subjective:    Patient ID: Emily Ryan, female    DOB: 07/08/1945, 74 y.o.   MRN: [id number]  HPI  Patient is a 74 y.o. [id number] female who presents for 6 month f/u of vaginal atrophy and dyspareunia.  Has been using Premarin cream as prescribed for the past 6 months. She continues to note improvement in her symptoms. Also using Lucretia Field for her dyspareunia which is working well.   The following portions of the patient's history were reviewed and updated as appropriate: allergies, current medications, past family history, past medical history, past social history, past surgical history and problem list.  Review of Systems Pertinent items noted in HPI and remainder of comprehensive ROS otherwise negative.   Objective:   Blood pressure (!) 146/80, pulse 85, height 4\' 11"  (1.499 m), weight 135 lb 6.4 oz (61.4 kg). General appearance: alert and no distress Abdomen: soft, non-tender; bowel sounds normal; no masses,  no organomegaly Pelvic: external genitalia overall normal with some mild pallor of the labia minora.  Rectovaginal septum with moderate atrophy, possible lichenification, and 2 small healing fissures. Mild to moderate atrophy of the vagina.  No discharge or lesions.   Uterus and cervix surgically absent. Adnexae non-tender, nonpalpable.    Assessment:   1. Vaginal atrophy   2. Dyspareunia, female    Plan:   - Continue use of Premarin cream.  Continue to use 2-3 times weekly.  - Continue use of lubricants for dyspareunia.  - F/u in 1 year.   Rubie Maid, MD [facility]

## 2019-12-24 ENCOUNTER — Other Ambulatory Visit: Payer: Self-pay | Admitting: Internal Medicine

## 2019-12-24 DIAGNOSIS — M25511 Pain in right shoulder: Secondary | ICD-10-CM | POA: Diagnosis not present

## 2019-12-24 DIAGNOSIS — Z79899 Other long term (current) drug therapy: Secondary | ICD-10-CM

## 2019-12-24 NOTE — Telephone Encounter (Signed)
Pt dropped off form. Placed in folder up front

## 2020-01-01 DIAGNOSIS — M25511 Pain in right shoulder: Secondary | ICD-10-CM | POA: Diagnosis not present

## 2020-01-06 DIAGNOSIS — M25511 Pain in right shoulder: Secondary | ICD-10-CM | POA: Diagnosis not present

## 2020-01-14 ENCOUNTER — Other Ambulatory Visit: Payer: Self-pay

## 2020-01-14 ENCOUNTER — Other Ambulatory Visit (INDEPENDENT_AMBULATORY_CARE_PROVIDER_SITE_OTHER): Payer: Medicare Other

## 2020-01-14 DIAGNOSIS — Z79899 Other long term (current) drug therapy: Secondary | ICD-10-CM | POA: Diagnosis not present

## 2020-01-14 DIAGNOSIS — E119 Type 2 diabetes mellitus without complications: Secondary | ICD-10-CM

## 2020-01-14 DIAGNOSIS — R635 Abnormal weight gain: Secondary | ICD-10-CM | POA: Diagnosis not present

## 2020-01-14 LAB — CBC WITH DIFFERENTIAL/PLATELET
Basophils Absolute: 0 10*3/uL (ref 0.0–0.1)
Basophils Relative: 0.6 % (ref 0.0–3.0)
Eosinophils Absolute: 0.1 10*3/uL (ref 0.0–0.7)
Eosinophils Relative: 2.1 % (ref 0.0–5.0)
HCT: 41.7 % (ref 36.0–46.0)
Hemoglobin: 14.6 g/dL (ref 12.0–15.0)
Lymphocytes Relative: 32.9 % (ref 12.0–46.0)
Lymphs Abs: 1.7 10*3/uL (ref 0.7–4.0)
MCHC: 35 g/dL (ref 30.0–36.0)
MCV: 98.7 fl (ref 78.0–100.0)
Monocytes Absolute: 0.6 10*3/uL (ref 0.1–1.0)
Monocytes Relative: 11.7 % (ref 3.0–12.0)
Neutro Abs: 2.7 10*3/uL (ref 1.4–7.7)
Neutrophils Relative %: 52.7 % (ref 43.0–77.0)
Platelets: 247 10*3/uL (ref 150.0–400.0)
RBC: 4.23 Mil/uL (ref 3.87–5.11)
RDW: 13.4 % (ref 11.5–15.5)
WBC: 5.1 10*3/uL (ref 4.0–10.5)

## 2020-01-14 LAB — COMPREHENSIVE METABOLIC PANEL
ALT: 46 U/L — ABNORMAL HIGH (ref 0–35)
AST: 28 U/L (ref 0–37)
Albumin: 4.2 g/dL (ref 3.5–5.2)
Alkaline Phosphatase: 72 U/L (ref 39–117)
BUN: 11 mg/dL (ref 6–23)
CO2: 30 mEq/L (ref 19–32)
Calcium: 9.2 mg/dL (ref 8.4–10.5)
Chloride: 100 mEq/L (ref 96–112)
Creatinine, Ser: 0.64 mg/dL (ref 0.40–1.20)
GFR: 86.58 mL/min (ref >60.00)
Glucose, Bld: 186 mg/dL — ABNORMAL HIGH (ref 70–99)
Potassium: 4.2 mEq/L (ref 3.5–5.1)
Sodium: 137 mEq/L (ref 135–145)
Total Bilirubin: 0.7 mg/dL (ref 0.2–1.2)
Total Protein: 6.9 g/dL (ref 6.0–8.3)

## 2020-01-14 LAB — LIPID PANEL
Cholesterol: 154 mg/dL (ref 0–200)
HDL: 56.2 mg/dL (ref >39.00)
LDL Cholesterol: 71 mg/dL (ref 0–99)
NonHDL: 98.24
Total CHOL/HDL Ratio: 3
Triglycerides: 138 mg/dL (ref 0.0–149.0)
VLDL: 27.6 mg/dL (ref 0.0–40.0)

## 2020-01-14 LAB — HEMOGLOBIN A1C: Hgb A1c MFr Bld: 7.8 % — ABNORMAL HIGH (ref 4.6–6.5)

## 2020-01-14 LAB — TSH: TSH: 3.11 u[IU]/mL (ref 0.35–4.50)

## 2020-01-14 NOTE — Progress Notes (Signed)
Attached are your recent fasting labs,  We will discuss in detail at your upcoming visit.  Regards,   Venancio Chenier, MD    

## 2020-01-17 ENCOUNTER — Ambulatory Visit (INDEPENDENT_AMBULATORY_CARE_PROVIDER_SITE_OTHER): Payer: Medicare Other | Admitting: Internal Medicine

## 2020-01-17 ENCOUNTER — Other Ambulatory Visit: Payer: Self-pay

## 2020-01-17 ENCOUNTER — Encounter: Payer: Self-pay | Admitting: Internal Medicine

## 2020-01-17 ENCOUNTER — Telehealth: Payer: Self-pay | Admitting: Internal Medicine

## 2020-01-17 DIAGNOSIS — I10 Essential (primary) hypertension: Secondary | ICD-10-CM

## 2020-01-17 DIAGNOSIS — E782 Mixed hyperlipidemia: Secondary | ICD-10-CM

## 2020-01-17 DIAGNOSIS — E1165 Type 2 diabetes mellitus with hyperglycemia: Secondary | ICD-10-CM | POA: Diagnosis not present

## 2020-01-17 MED ORDER — EZETIMIBE 10 MG PO TABS
10.0000 mg | ORAL_TABLET | Freq: Every day | ORAL | 1 refills | Status: DC
Start: 2020-01-17 — End: 2020-03-13

## 2020-01-17 MED ORDER — FREESTYLE LIBRE 14 DAY READER DEVI: 5 refills | Status: DC

## 2020-01-17 MED ORDER — FREESTYLE LIBRE 14 DAY SENSOR MISC: 5 refills | Status: DC

## 2020-01-17 MED ORDER — LOSARTAN POTASSIUM 100 MG PO TABS
100.0000 mg | ORAL_TABLET | Freq: Every day | ORAL | 1 refills | Status: DC
Start: 2020-01-17 — End: 2020-03-13

## 2020-01-17 MED ORDER — LORATADINE 10 MG PO TABS
10.0000 mg | ORAL_TABLET | Freq: Every day | ORAL | 1 refills | Status: DC
Start: 2020-01-17 — End: 2020-03-13

## 2020-01-17 MED ORDER — OMEPRAZOLE 20 MG PO CPDR: 20.0000 mg | DELAYED_RELEASE_CAPSULE | Freq: Every day | ORAL | 1 refills | Status: DC

## 2020-01-17 MED ORDER — ESCITALOPRAM OXALATE 5 MG PO TABS
5.0000 mg | ORAL_TABLET | Freq: Every evening | ORAL | 1 refills | Status: DC
Start: 2020-01-17 — End: 2020-03-13

## 2020-01-17 MED ORDER — FREESTYLE LITE TEST VI STRP: ORAL_STRIP | 3 refills | Status: DC

## 2020-01-17 MED ORDER — GLIPIZIDE ER 10 MG PO TB24
10.0000 mg | ORAL_TABLET | Freq: Every day | ORAL | 2 refills | Status: DC
Start: 2020-01-17 — End: 2020-07-30

## 2020-01-17 NOTE — Patient Instructions (Addendum)
  WE ARE INCREASING THE GLIPZIDE TO 10 MG DAILY  FREESTYLE MONITOR sent to [facility].  IF INSURANCE PAYS,  MAKE RN VISIT TO LEARN HOW TO USE IT   CHECK WITH [facility] ABOUT TETANUS BOOSTER.. YOU ARE DUE IN November   Read about the commercially available [employer]  Diet if you get frustrated.  It has been 100% successful in helping my diabetic patients lose weight    Try these prepared meals (BECAUSE PORTION SIZE MATTERS!)   healthy choice low carb power bowls  Lean cuisine  Broccoli /chicken alfredo (no pasta)

## 2020-01-17 NOTE — Progress Notes (Signed)
Subjective:  Patient ID: Emily Ryan, female    DOB: 1945-02-27  Age: 75 y.o. MRN: [id number]  CC: Diagnoses of Uncontrolled type 2 diabetes mellitus with hyperglycemia ([place]), Mixed hyperlipidemia, and White coat syndrome with diagnosis of hypertension were pertinent to this visit.  HPI Emily Ryan presents for FOLLOW UP ON TYPE 2 DIABETES  This visit occurred during the SARS-CoV-2 public health emergency.  Safety protocols were in place, including screening questions prior to the visit, additional usage of staff PPE, and extensive cleaning of exam room while observing appropriate contact time as indicated for disinfecting solutions.     She feels generally well, has not beenexercising much lately due to upcoming shoulder replacement . blood sugars once daily at variable times.  BS have been under 130 fasting and < 190 post prandially.  Denies any recent hypoglyemic events.  Has been taking Trulicity weekly  For over a year along with metformin and glipizide  Following a carbohydrate modified diet 6 days per week. Denies numbness, burning and tingling of extremities. Appetite is good.    Fasting is 152   Dr Roland Rack replacing right shoulder Feb 8 .    Wearing hearing aides     Outpatient Medications Prior to Visit  Medication Sig Dispense Refill  . amLODipine (NORVASC) 2.5 MG tablet TAKE 1 TABLET(2.5 MG) BY MOUTH DAILY 90 tablet 1  . Ascorbic Acid (VITAMIN C) 1000 MG tablet Take 1,000 mg by mouth daily.     Marland Kitchen aspirin 81 MG chewable tablet Chew 81 mg by mouth daily.     Marland Kitchen azithromycin (ZITHROMAX) 250 MG tablet 2 tablets one day 1,  One tablet daily until gone 6 tablet 0  . Calcium Carb-Cholecalciferol 600-100 MG-UNIT CAPS Take 1 tablet by mouth daily. Two by mouth daily (Patient taking differently: Take 1 tablet by mouth daily.) 90 capsule 3  . clobetasol cream (TEMOVATE) 0.05 % 2 (two) times daily as needed. As needed    . conjugated estrogens (PREMARIN) vaginal cream Place 1  Applicatorful vaginally daily. 42.5 g 3  . Dulaglutide (TRULICITY) 1.5 WI/0.9BD SOPN Inject 0.5 mLs (1.5 mg total) into the skin once a week. 4 pen 11  . folic acid (FOLVITE) 1 MG tablet Take 1 tablet (1 mg total) by mouth See admin instructions. Takes daily except on Saturday 90 tablet 3  . hydrochlorothiazide (HYDRODIURIL) 12.5 MG tablet Take 1 tablet (12.5 mg total) by mouth daily. 90 tablet 3  . metFORMIN (GLUCOPHAGE XR) 750 MG 24 hr tablet Take 2 tablets (1,500 mg total) by mouth daily with breakfast. 180 tablet 1  . methotrexate (RHEUMATREX) 2.5 MG tablet Take 7.5 mg by mouth every Saturday.     . montelukast (SINGULAIR) 10 MG tablet Take 1 tablet (10 mg total) by mouth at bedtime. 90 tablet 3  . Multiple Vitamin (MULTIVITAMIN) capsule Take 1 capsule by mouth daily.    Marland Kitchen oseltamivir (TAMIFLU) 75 MG capsule Take 1 capsule (75 mg total) by mouth 2 (two) times daily. 10 capsule 0  . Precision Thin Lancets MISC Use as directed to check sugars twice daily 180 each 3  . simvastatin (ZOCOR) 20 MG tablet Take 1 tablet (20 mg total) by mouth at bedtime. 90 tablet 3  . vitamin E 400 UNIT capsule Take 400 Units by mouth daily.    . ciprofloxacin (CIPRO) 250 MG tablet Take 1 tablet (250 mg total) by mouth 2 (two) times daily. 28 tablet 0  . escitalopram (LEXAPRO) 5 MG tablet  Take 1 tablet (5 mg total) by mouth every evening. 90 tablet 3  . ezetimibe (ZETIA) 10 MG tablet Take 1 tablet (10 mg total) by mouth daily. 90 tablet 1  . glipiZIDE (GLUCOTROL XL) 5 MG 24 hr tablet Take 1 tablet (5 mg total) by mouth daily with breakfast. 90 tablet 1  . glucose blood (FREESTYLE LITE) test strip Use as instructed to test blood sugar twice daily 200 each 3  . loratadine (CLARITIN) 10 MG tablet Take 1 tablet (10 mg total) by mouth daily. 90 tablet 1  . losartan (COZAAR) 100 MG tablet Take 1 tablet (100 mg total) by mouth daily. 90 tablet 3  . omeprazole (PRILOSEC) 20 MG capsule Take 1 capsule (20 mg total) by mouth  daily. 90 capsule 1  . azithromycin (ZITHROMAX) 250 MG tablet 2 tablets one day 1,  One tablet daily until gone (Patient not taking: Reported on 01/17/2020) 6 tablet 0  . calcium carbonate (OSCAL) 1500 (600 Ca) MG TABS tablet Take by mouth. (Patient not taking: Reported on 01/17/2020)    . ciprofloxacin (CIPRO) 250 MG tablet Take 1 tablet (250 mg total) by mouth 2 (two) times daily. (Patient not taking: Reported on 01/17/2020) 6 tablet 0  . Omega-3 Fatty Acids (FISH OIL) 1000 MG CAPS Take 1 capsule by mouth daily. (Patient not taking: Reported on 01/17/2020)    . oseltamivir (TAMIFLU) 75 MG capsule Take 1 capsule (75 mg total) by mouth 2 (two) times daily. (Patient not taking: Reported on 01/17/2020) 10 capsule 0   No facility-administered medications prior to visit.    Review of Systems;  Patient denies headache, fevers, malaise, unintentional weight loss, skin rash, eye pain, sinus congestion and sinus pain, sore throat, dysphagia,  hemoptysis , cough, dyspnea, wheezing, chest pain, palpitations, orthopnea, edema, abdominal pain, nausea, melena, diarrhea, constipation, flank pain, dysuria, hematuria, urinary  Frequency, nocturia, numbness, tingling, seizures,  Focal weakness, Loss of consciousness,  Tremor, insomnia, depression, anxiety, and suicidal ideation.      Objective:  BP 116/78 (BP Location: Left Arm, Patient Position: Sitting)   Pulse 80   Temp 98.5 F (36.9 C)   Ht 4' 11.02" (1.499 m)   Wt 135 lb (61.2 kg)   SpO2 95%   BMI 27.25 kg/m   BP Readings from Last 3 Encounters:  01/17/20 116/78  12/20/19 (!) 146/80  10/14/19 (!) 150/86    Wt Readings from Last 3 Encounters:  01/17/20 135 lb (61.2 kg)  12/20/19 135 lb 6.4 oz (61.4 kg)  10/14/19 135 lb 12.8 oz (61.6 kg)    General appearance: alert, cooperative and appears stated age Ears: normal TM's and external ear canals both ears Throat: lips, mucosa, and tongue normal; teeth and gums normal Neck: no adenopathy, no  carotid bruit, supple, symmetrical, trachea midline and thyroid not enlarged, symmetric, no tenderness/mass/nodules Back: symmetric, no curvature. ROM normal. No CVA tenderness. Lungs: clear to auscultation bilaterally Heart: regular rate and rhythm, S1, S2 normal, no murmur, click, rub or gallop Abdomen: soft, non-tender; bowel sounds normal; no masses,  no organomegaly Pulses: 2+ and symmetric Skin: Skin color, texture, turgor normal. No rashes or lesions Lymph nodes: Cervical, supraclavicular, and axillary nodes normal.  Lab Results  Component Value Date   HGBA1C 7.8 (H) 01/14/2020   HGBA1C 8.0 (H) 10/10/2019   HGBA1C 7.0 (H) 04/09/2019    Lab Results  Component Value Date   CREATININE 0.64 01/14/2020   CREATININE 0.58 10/10/2019   CREATININE 0.71 04/09/2019  Lab Results  Component Value Date   WBC 5.1 01/14/2020   HGB 14.6 01/14/2020   HCT 41.7 01/14/2020   PLT 247.0 01/14/2020   GLUCOSE 186 (H) 01/14/2020   CHOL 154 01/14/2020   TRIG 138.0 01/14/2020   HDL 56.20 01/14/2020   LDLDIRECT 72.0 04/12/2018   LDLCALC 71 01/14/2020   ALT 46 (H) 01/14/2020   AST 28 01/14/2020   NA 137 01/14/2020   K 4.2 01/14/2020   CL 100 01/14/2020   CREATININE 0.64 01/14/2020   BUN 11 01/14/2020   CO2 30 01/14/2020   TSH 3.11 01/14/2020   HGBA1C 7.8 (H) 01/14/2020   MICROALBUR 1.5 10/10/2019    MR SHOULDER RIGHT WO CONTRAST  Result Date: 11/23/2019 CLINICAL DATA:  Right shoulder pain for 3 months. Painful range of motion. EXAM: MRI OF THE RIGHT SHOULDER WITHOUT CONTRAST TECHNIQUE: Multiplanar, multisequence MR imaging of the shoulder was performed. No intravenous contrast was administered. COMPARISON:  None. FINDINGS: Rotator cuff: Large full-thickness retracted supraspinatus tendon tear. Maximum retraction is 3 cm. There is significant tendinopathy, interstitial tears and partial-thickness bursal and articular surface tearing of the infraspinatus tendon but no complete  tear/rupture. The upper fibers of the subscapularis tendon are torn and retracted approximately 10 mm. The mid and lower fibers are intact. Muscles:  Fatty atrophy of the supraspinatus muscle. Biceps long head:  Torn and retracted. Acromioclavicular Joint: Mild to moderate degenerative changes. Type 1 acromion. No lateral downsloping or subacromial spurring. Glenohumeral Joint: Mild degenerative changes. Small joint effusion. Labrum: Degenerative changes involving the superior labrum. There appears to be I residual piece of the long head biceps tendon attached to the biceps anchor. The anterior and posterior labrum are intact. Bones:  No acute bony findings. Other: Expected fluid in the subacromial/subdeltoid bursa and expected narrowing of the humeroacromial space. IMPRESSION: 1. Large chronic full-thickness retracted supraspinatus tendon tear. Associated fatty atrophy of the supraspinatus muscle. 2. Significant tendinopathy, interstitial tears and partial-thickness bursal and articular surface tearing of the infraspinatus tendon. 3. Small retracted tear involving the upper fibers of the subscapularis tendon. 4. Torn and retracted long head biceps tendon. 5. Degenerative changes involving the superior labrum. I believe part of the torn biceps tendon is still attached to the glenoid labrum. Electronically Signed   By: Marijo Sanes M.D.   On: 11/23/2019 14:00    Assessment & Plan:   Problem List Items Addressed This Visit      Unprioritized   Hyperlipidemia    LDL is < 100 and triglycerides are at goal on simvastatin/zetia . She has no side effects an ALT has increased with weight gain (hepatic steatosis hx). No changes today. Continue aspirin low dose.  Lab Results  Component Value Date   CHOL 154 01/14/2020   HDL 56.20 01/14/2020   LDLCALC 71 01/14/2020   LDLDIRECT 72.0 04/12/2018   TRIG 138.0 01/14/2020   CHOLHDL 3 01/14/2020   Lab Results  Component Value Date   ALT 46 (H) 01/14/2020   AST  28 01/14/2020   ALKPHOS 72 01/14/2020   BILITOT 0.7 01/14/2020          Relevant Medications   ezetimibe (ZETIA) 10 MG tablet   losartan (COZAAR) 100 MG tablet   Uncontrolled type 2 diabetes mellitus (HCC)    Loss of control CONTINUED despite increase metformin dose and change in glipzide to xr dosing  5mg  , and trulicity   Recommend increasing glipiide to 10 mg daily. Discussed use of a CBG monitor.  Continue losartan and simvastatin   Lab Results  Component Value Date   HGBA1C 7.8 (H) 01/14/2020   Lab Results  Component Value Date   MICROALBUR 1.5 10/10/2019   MICROALBUR <0.7 08/08/2018           Relevant Medications   losartan (COZAAR) 100 MG tablet   glipiZIDE (GLUCOTROL XL) 10 MG 24 hr tablet   White coat syndrome with diagnosis of hypertension    Patient is taking her medications as prescribed and notes no adverse effects.  Home BP readings have been done about once per week and are  generally < 130/80 .  She is avoiding added salt in her diet and walking regularly about 3 times per week for exercise  .      Relevant Medications   ezetimibe (ZETIA) 10 MG tablet   losartan (COZAAR) 100 MG tablet      I have discontinued [name] Fish Oil, calcium carbonate, ciprofloxacin, ciprofloxacin, FreeStyle Libre 14 Day Reader, and [employer] 14 Day Sensor. I have also changed her glipiZIDE. Additionally, I am having her start on Dexcom G6 Receiver, Dexcom G6 Sensor, and Dexcom G6 Transmitter. Lastly, I am having her maintain her vitamin E, multivitamin, methotrexate, Precision Thin Lancets, Calcium Carb-Cholecalciferol, aspirin, vitamin C, folic acid, clobetasol cream, Premarin, Trulicity, metFORMIN, oseltamivir, azithromycin, simvastatin, montelukast, hydrochlorothiazide, amLODipine, ezetimibe, FREESTYLE LITE, omeprazole, escitalopram, loratadine, and losartan.  Meds ordered this encounter  Medications  . ezetimibe (ZETIA) 10 MG tablet    Sig: Take 1 tablet  (10 mg total) by mouth daily.    Dispense:  90 tablet    Refill:  1  . glucose blood (FREESTYLE LITE) test strip    Sig: Use as instructed to test blood sugar twice daily    Dispense:  200 each    Refill:  3    Please dispense 90 day supply  . omeprazole (PRILOSEC) 20 MG capsule    Sig: Take 1 capsule (20 mg total) by mouth daily.    Dispense:  90 capsule    Refill:  1  . escitalopram (LEXAPRO) 5 MG tablet    Sig: Take 1 tablet (5 mg total) by mouth every evening.    Dispense:  90 tablet    Refill:  1  . loratadine (CLARITIN) 10 MG tablet    Sig: Take 1 tablet (10 mg total) by mouth daily.    Dispense:  90 tablet    Refill:  1  . losartan (COZAAR) 100 MG tablet    Sig: Take 1 tablet (100 mg total) by mouth daily.    Dispense:  90 tablet    Refill:  1    Replaces lisinopril  . glipiZIDE (GLUCOTROL XL) 10 MG 24 hr tablet    Sig: Take 1 tablet (10 mg total) by mouth daily with breakfast.    Dispense:  90 tablet    Refill:  2    NOTE DOSE INCREASE  . DISCONTD: Continuous Blood Gluc Receiver (FREESTYLE LIBRE 14 DAY READER) DEVI    Sig: 1    Dispense:  1 each    Refill:  5    Use to check sugars 6 times daily  . DISCONTD: Continuous Blood Gluc Sensor (FREESTYLE LIBRE 14 DAY SENSOR) MISC    Sig: Use to check sugars 6 times daily    Dispense:  1 each    Refill:  5  . Continuous Blood Gluc Receiver (DEXCOM G6 RECEIVER) DEVI    Sig: Use to check  pre and post prandial sugars 6 time daily   .  For uncontrolled diabetes    Dispense:  1 each    Refill:  11    E11.65  . Continuous Blood Gluc Sensor (DEXCOM G6 SENSOR) MISC    Sig: Use to check pre and post prandial sugars 6 time daily  E11.69    Dispense:  1 each    Refill:  11  . Continuous Blood Gluc Transmit (DEXCOM G6 TRANSMITTER) MISC    Sig: Use to check pre and post prandial sugars 6 time daily   . E11.69    Dispense:  1 each    Refill:  11    Medications Discontinued During This Encounter  Medication Reason  . calcium  carbonate (OSCAL) 1500 (600 Ca) MG TABS tablet Completed Course  . azithromycin (ZITHROMAX) 250 MG tablet Completed Course  . ciprofloxacin (CIPRO) 250 MG tablet Completed Course  . oseltamivir (TAMIFLU) 75 MG capsule Completed Course  . Omega-3 Fatty Acids (FISH OIL) 1000 MG CAPS Completed Course  . glucose blood (FREESTYLE LITE) test strip Reorder  . escitalopram (LEXAPRO) 5 MG tablet Reorder  . losartan (COZAAR) 100 MG tablet Reorder  . omeprazole (PRILOSEC) 20 MG capsule Reorder  . loratadine (CLARITIN) 10 MG tablet Reorder  . ezetimibe (ZETIA) 10 MG tablet Reorder  . ciprofloxacin (CIPRO) 250 MG tablet   . glipiZIDE (GLUCOTROL XL) 5 MG 24 hr tablet   . Continuous Blood Gluc Sensor (FREESTYLE LIBRE 14 DAY SENSOR) MISC   . Continuous Blood Gluc Receiver (FREESTYLE LIBRE 22 DAY READER) DEVI     I provided  30 minutes of  face-to-face time during this encounter reviewing patient's current problems and past surgeries, labs and imaging studies, providing counseling on the above mentioned problems , and coordination  of care .  Follow-up: Return in about 3 months (around 04/16/2020) for follow up diabetes.   Crecencio Mc, MD

## 2020-01-17 NOTE — Telephone Encounter (Signed)
Pharmacy called they do not keep the [place] in stock and that she would have to go off post and pay the reg amount. They are wanting to know if an alternative can be called in

## 2020-01-19 MED ORDER — DEXCOM G6 RECEIVER DEVI: 11 refills | Status: DC

## 2020-01-19 MED ORDER — DEXCOM G6 SENSOR MISC: 11 refills | Status: DC

## 2020-01-19 MED ORDER — DEXCOM G6 TRANSMITTER MISC: 11 refills | Status: DC

## 2020-01-19 NOTE — Assessment & Plan Note (Signed)
LDL is < 100 and triglycerides are at goal on simvastatin/zetia . She has no side effects an ALT has increased with weight gain (hepatic steatosis hx). No changes today. Continue aspirin low dose.  Lab Results  Component Value Date   CHOL 154 01/14/2020   HDL 56.20 01/14/2020   LDLCALC 71 01/14/2020   LDLDIRECT 72.0 04/12/2018   TRIG 138.0 01/14/2020   CHOLHDL 3 01/14/2020   Lab Results  Component Value Date   ALT 46 (H) 01/14/2020   AST 28 01/14/2020   ALKPHOS 72 01/14/2020   BILITOT 0.7 01/14/2020

## 2020-01-19 NOTE — Assessment & Plan Note (Addendum)
Loss of control CONTINUED despite increase metformin dose and change in glipzide to xr dosing  5mg  , and trulicity   Recommend increasing glipiide to 10 mg daily. Discussed use of a CBG monitor.  Continue losartan and simvastatin   Lab Results  Component Value Date   HGBA1C 7.8 (H) 01/14/2020   Lab Results  Component Value Date   MICROALBUR 1.5 10/10/2019   MICROALBUR <0.7 08/08/2018

## 2020-01-19 NOTE — Assessment & Plan Note (Signed)
Patient is taking her medications as prescribed and notes no adverse effects.  Home BP readings have been done about once per week and are  generally < 130/80 .  She is avoiding added salt in her diet and walking regularly about 3 times per week for exercise  . 

## 2020-01-21 NOTE — Telephone Encounter (Signed)
The alternative CBG monitor DEXCOM has been sent to the [facility] DOD [employer]

## 2020-01-21 NOTE — Telephone Encounter (Signed)
I do not see Ozempic on her medication list.

## 2020-01-23 MED ORDER — TRULICITY 1.5 MG/0.5ML ~~LOC~~ SOAJ
1.5000 mg | SUBCUTANEOUS | 3 refills | Status: DC
Start: 2020-01-23 — End: 2020-08-18

## 2020-01-23 NOTE — Telephone Encounter (Signed)
Patient is taking Trulicity,  Not ozempic  rx printed  chck mychat

## 2020-01-23 NOTE — Telephone Encounter (Signed)
rx has been faxed 

## 2020-02-17 ENCOUNTER — Other Ambulatory Visit: Payer: Self-pay | Admitting: Surgery

## 2020-02-19 ENCOUNTER — Other Ambulatory Visit: Payer: Self-pay

## 2020-02-19 ENCOUNTER — Inpatient Hospital Stay: Admission: RE | Admit: 2020-02-19 | Payer: Medicare Other | Source: Ambulatory Visit

## 2020-02-19 ENCOUNTER — Encounter
Admission: RE | Admit: 2020-02-19 | Discharge: 2020-02-19 | Disposition: A | Discharge status: Home / Self Care | Payer: Medicare Other | Source: Ambulatory Visit | Attending: Surgery | Admitting: Surgery

## 2020-02-19 DIAGNOSIS — Z01818 Encounter for other preprocedural examination: Secondary | ICD-10-CM | POA: Insufficient documentation

## 2020-02-19 DIAGNOSIS — Z01810 Encounter for preprocedural cardiovascular examination: Secondary | ICD-10-CM | POA: Diagnosis not present

## 2020-02-19 LAB — CBC WITH DIFFERENTIAL/PLATELET
Abs Immature Granulocytes: 0.03 10*3/uL (ref 0.00–0.07)
Basophils Absolute: 0 10*3/uL (ref 0.0–0.1)
Basophils Relative: 0 %
Eosinophils Absolute: 0.1 10*3/uL (ref 0.0–0.5)
Eosinophils Relative: 1 %
HCT: 42.2 % (ref 36.0–46.0)
Hemoglobin: 14.5 g/dL (ref 12.0–15.0)
Immature Granulocytes: 1 %
Lymphocytes Relative: 28 %
Lymphs Abs: 1.6 10*3/uL (ref 0.7–4.0)
MCH: 33.9 pg (ref 26.0–34.0)
MCHC: 34.4 g/dL (ref 30.0–36.0)
MCV: 98.6 fL (ref 80.0–100.0)
Monocytes Absolute: 0.7 10*3/uL (ref 0.1–1.0)
Monocytes Relative: 13 %
Neutro Abs: 3.2 10*3/uL (ref 1.7–7.7)
Neutrophils Relative %: 57 %
Platelets: 239 10*3/uL (ref 150–400)
RBC: 4.28 MIL/uL (ref 3.87–5.11)
RDW: 12.8 % (ref 11.5–15.5)
WBC: 5.7 10*3/uL (ref 4.0–10.5)
nRBC: 0 % (ref 0.0–0.2)

## 2020-02-19 LAB — URINALYSIS, ROUTINE W REFLEX MICROSCOPIC
Bilirubin Urine: NEGATIVE
Glucose, UA: 50 mg/dL — AB
Hgb urine dipstick: NEGATIVE
Ketones, ur: NEGATIVE mg/dL
Leukocytes,Ua: NEGATIVE
Nitrite: NEGATIVE
Protein, ur: NEGATIVE mg/dL
Specific Gravity, Urine: 1.013 (ref 1.005–1.030)
pH: 5 (ref 5.0–8.0)

## 2020-02-19 LAB — TYPE AND SCREEN
ABO/RH(D): B NEG
Antibody Screen: NEGATIVE

## 2020-02-19 LAB — SURGICAL PCR SCREEN
MRSA, PCR: NEGATIVE
Staphylococcus aureus: NEGATIVE

## 2020-02-19 LAB — COMPREHENSIVE METABOLIC PANEL
ALT: 56 U/L — ABNORMAL HIGH (ref 0–44)
AST: 36 U/L (ref 15–41)
Albumin: 4.1 g/dL (ref 3.5–5.0)
Alkaline Phosphatase: 81 U/L (ref 38–126)
Anion gap: 12 (ref 5–15)
BUN: 12 mg/dL (ref 8–23)
CO2: 27 mmol/L (ref 22–32)
Calcium: 9.4 mg/dL (ref 8.9–10.3)
Chloride: 97 mmol/L — ABNORMAL LOW (ref 98–111)
Creatinine, Ser: 0.57 mg/dL (ref 0.44–1.00)
GFR, Estimated: >60 mL/min (ref >60)
Glucose, Bld: 234 mg/dL — ABNORMAL HIGH (ref 70–99)
Potassium: 4 mmol/L (ref 3.5–5.1)
Sodium: 136 mmol/L (ref 135–145)
Total Bilirubin: 0.6 mg/dL (ref 0.3–1.2)
Total Protein: 7.3 g/dL (ref 6.5–8.1)

## 2020-02-19 NOTE — Patient Instructions (Signed)
Your procedure is scheduled on: February 26, 2020 [facility]  Report to the Registration Desk on the 1st floor of the [facility]. To find out your arrival time, please call [phone] between 1PM - 3PM on: February 25, 2020 TUESDAY  REMEMBER: Instructions that are not followed completely may result in serious medical risk, up to and including death; or upon the discretion of your surgeon and anesthesiologist your surgery may need to be rescheduled.  Do not eat food after midnight the night before surgery.  No gum chewing, lozengers or hard candies.  You may however, drink CLEAR liquids up to 2 hours before you are scheduled to arrive for your surgery. Do not drink anything within 2 hours of your scheduled arrival time.  Clear liquids include: - water    Type 1 and Type 2 diabetics should only drink water.  Marland Kitchen TAKE THESE MEDICATIONS THE MORNING OF SURGERY WITH A SIP OF WATER: ZETIA CLARITIN PRILOSEC (take one the night before and one on the morning of surgery - helps to prevent nausea after surgery.)    Stop Metformin 2 days prior to surgery. LAST DOSE 02/23/2020  Follow recommendations from Cardiologist, Pulmonologist or PCP regarding stopping Aspirin. STOP ASPIRIN TODAY  One week prior to surgery: Stop Anti-inflammatories (NSAIDS) such as Advil, Aleve, Ibuprofen, Motrin, Naproxen, Naprosyn and Aspirin based products such as Excedrin, Goodys Powder, BC Powder.   USE TYLENOL IF NEEDED Stop ANY OVER THE COUNTER supplements until after surgery. (However, you may continue taking Vitamin D, Vitamin B, FOLIC ACID and multivitamin up until the day before surgery.)  No Alcohol for 24 hours before or after surgery.  No Smoking including e-cigarettes for 24 hours prior to surgery.  No chewable tobacco products for at least 6 hours prior to surgery.  No nicotine patches on the day of surgery.  Do not use any "recreational" drugs for at least a week prior to your surgery.  Please be  advised that the combination of cocaine and anesthesia may have negative outcomes, up to and including death. If you test positive for cocaine, your surgery will be cancelled.  On the morning of surgery brush your teeth with toothpaste and water, you may rinse your mouth with mouthwash if you wish. Do not swallow any toothpaste or mouthwash.  Do not wear jewelry, make-up, hairpins, clips or nail polish.  Do not wear lotions, powders, or perfumes OR DEODORANT  Do not shave body from the neck down 48 hours prior to surgery just in case you cut yourself which could leave a site for infection.  Also, freshly shaved skin may become irritated if using the CHG soap.  Contact lenses, hearing aids and dentures may not be worn into surgery.  Do not bring valuables to the hospital. [facility] is not responsible for any missing/lost belongings or valuables.   Use CHG Soap as directed on instruction sheet.  Total Shoulder Arthroplasty:  use Benzolyl Peroxide 5% Gel as directed on instruction sheet.  Notify your doctor if there is any change in your medical condition (cold, fever, infection).  Wear comfortable clothing (specific to your surgery type) to the hospital.  Plan for stool softeners for home use; pain medications have a tendency to cause constipation. You can also help prevent constipation by eating foods high in fiber such as fruits and vegetables and drinking plenty of fluids as your diet allows.  After surgery, you can help prevent lung complications by doing breathing exercises.  Take deep breaths and cough  every 1-2 hours. Your doctor may order a device called an Incentive Spirometer to help you take deep breaths. When coughing or sneezing, hold a pillow firmly against your incision with both hands. This is called "splinting." Doing this helps protect your incision. It also decreases belly discomfort.  If you are being discharged the day of surgery, you will not be allowed to drive  home. You will need a responsible adult (18 years or older) to drive you home and stay with you that night.   Please call the [facility] Dept. at [phone] if you have any questions about these instructions.  Visitation Policy:  Patients undergoing a surgery or procedure may have one family member or support person with them as long as that person is not COVID-19 positive or experiencing its symptoms.  That person may remain in the waiting area during the procedure.  Inpatient Visitation:    Visiting hours are 7 a.m. to 8 p.m. Patients will be allowed one visitor. The visitor may change daily. The visitor must pass COVID-19 screenings, use hand sanitizer when entering and exiting the patient's room and wear a mask at all times, including in the patient's room. Patients must also wear a mask when staff or their visitor are in the room. Masking is required regardless of vaccination status. Systemwide, no visitors 17 or younger.

## 2020-02-24 ENCOUNTER — Other Ambulatory Visit
Admission: RE | Admit: 2020-02-24 | Discharge: 2020-02-24 | Disposition: A | Discharge status: Home / Self Care | Payer: Medicare Other | Source: Ambulatory Visit | Attending: Surgery | Admitting: Surgery

## 2020-02-24 ENCOUNTER — Other Ambulatory Visit: Payer: Self-pay

## 2020-02-24 DIAGNOSIS — Z01812 Encounter for preprocedural laboratory examination: Secondary | ICD-10-CM | POA: Diagnosis not present

## 2020-02-24 DIAGNOSIS — Z20822 Contact with and (suspected) exposure to covid-19: Secondary | ICD-10-CM | POA: Diagnosis not present

## 2020-02-24 LAB — SARS CORONAVIRUS 2 (TAT 6-24 HRS): SARS Coronavirus 2: NEGATIVE

## 2020-02-25 ENCOUNTER — Other Ambulatory Visit: Payer: Medicare Other

## 2020-02-26 ENCOUNTER — Ambulatory Visit: Payer: Medicare Other | Admitting: Anesthesiology

## 2020-02-26 ENCOUNTER — Encounter
Admission: RE | Disposition: A | Discharge status: Home / Self Care | Payer: Self-pay | Source: Home / Self Care | Attending: Surgery

## 2020-02-26 ENCOUNTER — Ambulatory Visit: Payer: Medicare Other

## 2020-02-26 ENCOUNTER — Ambulatory Visit
Admission: RE | Admit: 2020-02-26 | Discharge: 2020-02-26 | Disposition: A | Discharge status: Home / Self Care | Payer: Medicare Other | Attending: Surgery | Admitting: Surgery

## 2020-02-26 ENCOUNTER — Other Ambulatory Visit: Payer: Self-pay

## 2020-02-26 ENCOUNTER — Encounter: Payer: Self-pay | Admitting: Surgery

## 2020-02-26 DIAGNOSIS — Z79899 Other long term (current) drug therapy: Secondary | ICD-10-CM | POA: Diagnosis not present

## 2020-02-26 DIAGNOSIS — Z471 Aftercare following joint replacement surgery: Secondary | ICD-10-CM | POA: Diagnosis not present

## 2020-02-26 DIAGNOSIS — Z419 Encounter for procedure for purposes other than remedying health state, unspecified: Secondary | ICD-10-CM

## 2020-02-26 DIAGNOSIS — Z7984 Long term (current) use of oral hypoglycemic drugs: Secondary | ICD-10-CM | POA: Insufficient documentation

## 2020-02-26 DIAGNOSIS — M75121 Complete rotator cuff tear or rupture of right shoulder, not specified as traumatic: Secondary | ICD-10-CM | POA: Insufficient documentation

## 2020-02-26 DIAGNOSIS — Z96611 Presence of right artificial shoulder joint: Secondary | ICD-10-CM | POA: Diagnosis not present

## 2020-02-26 DIAGNOSIS — M25511 Pain in right shoulder: Secondary | ICD-10-CM | POA: Diagnosis not present

## 2020-02-26 DIAGNOSIS — M7581 Other shoulder lesions, right shoulder: Secondary | ICD-10-CM | POA: Diagnosis not present

## 2020-02-26 DIAGNOSIS — E119 Type 2 diabetes mellitus without complications: Secondary | ICD-10-CM | POA: Insufficient documentation

## 2020-02-26 DIAGNOSIS — M25811 Other specified joint disorders, right shoulder: Secondary | ICD-10-CM | POA: Insufficient documentation

## 2020-02-26 DIAGNOSIS — K2100 Gastro-esophageal reflux disease with esophagitis, without bleeding: Secondary | ICD-10-CM | POA: Diagnosis not present

## 2020-02-26 DIAGNOSIS — E785 Hyperlipidemia, unspecified: Secondary | ICD-10-CM | POA: Diagnosis not present

## 2020-02-26 DIAGNOSIS — M75101 Unspecified rotator cuff tear or rupture of right shoulder, not specified as traumatic: Secondary | ICD-10-CM | POA: Diagnosis present

## 2020-02-26 DIAGNOSIS — M12811 Other specific arthropathies, not elsewhere classified, right shoulder: Secondary | ICD-10-CM | POA: Diagnosis not present

## 2020-02-26 DIAGNOSIS — G8918 Other acute postprocedural pain: Secondary | ICD-10-CM | POA: Diagnosis not present

## 2020-02-26 DIAGNOSIS — M129 Arthropathy, unspecified: Secondary | ICD-10-CM | POA: Diagnosis not present

## 2020-02-26 HISTORY — PX: REVERSE SHOULDER ARTHROPLASTY: SHX5054

## 2020-02-26 LAB — GLUCOSE, CAPILLARY
Glucose-Capillary: 166 mg/dL — ABNORMAL HIGH (ref 70–99)
Glucose-Capillary: 228 mg/dL — ABNORMAL HIGH (ref 70–99)
Glucose-Capillary: 239 mg/dL — ABNORMAL HIGH (ref 70–99)

## 2020-02-26 SURGERY — ARTHROPLASTY, SHOULDER, TOTAL, REVERSE
Anesthesia: General | Site: Shoulder | Laterality: Right

## 2020-02-26 MED ORDER — MIDAZOLAM HCL 2 MG/2ML IJ SOLN
INTRAMUSCULAR | Status: AC
  Administered 2020-02-26: 1 mg via INTRAVENOUS
  Filled 2020-02-26: qty 2

## 2020-02-26 MED ORDER — TRANEXAMIC ACID 1000 MG/10ML IV SOLN
INTRAVENOUS | Status: AC
  Filled 2020-02-26: qty 10

## 2020-02-26 MED ORDER — BUPIVACAINE HCL (PF) 0.5 % IJ SOLN
INTRAMUSCULAR | Status: DC | PRN
  Administered 2020-02-26: 10 mL via PERINEURAL

## 2020-02-26 MED ORDER — SODIUM CHLORIDE 0.9 % IV SOLN
INTRAVENOUS | Status: DC | PRN
  Administered 2020-02-26: 35 ug/min via INTRAVENOUS

## 2020-02-26 MED ORDER — BUPIVACAINE LIPOSOME 1.3 % IJ SUSP: INTRAMUSCULAR | Status: DC | PRN

## 2020-02-26 MED ORDER — ONDANSETRON HCL 4 MG/2ML IJ SOLN: 4.0000 mg | Freq: Once | INTRAMUSCULAR | Status: DC | PRN

## 2020-02-26 MED ORDER — GLYCOPYRROLATE 0.2 MG/ML IJ SOLN
INTRAMUSCULAR | Status: DC | PRN
  Administered 2020-02-26: .2 mg via INTRAVENOUS

## 2020-02-26 MED ORDER — HYDROCODONE-ACETAMINOPHEN 5-325 MG PO TABS: 1.0000 | ORAL_TABLET | ORAL | Status: DC | PRN

## 2020-02-26 MED ORDER — METOCLOPRAMIDE HCL 10 MG PO TABS
5.0000 mg | ORAL_TABLET | Freq: Three times a day (TID) | ORAL | Status: DC | PRN
Start: 2020-02-26 — End: 2020-02-26

## 2020-02-26 MED ORDER — TRAMADOL HCL 50 MG PO TABS: 50.0000 mg | ORAL_TABLET | Freq: Four times a day (QID) | ORAL | 0 refills | Status: DC | PRN

## 2020-02-26 MED ORDER — DEXAMETHASONE SODIUM PHOSPHATE 10 MG/ML IJ SOLN
INTRAMUSCULAR | Status: AC
  Filled 2020-02-26: qty 1

## 2020-02-26 MED ORDER — CEFAZOLIN SODIUM-DEXTROSE 2-4 GM/100ML-% IV SOLN
INTRAVENOUS | Status: AC
  Filled 2020-02-26: qty 100

## 2020-02-26 MED ORDER — EPHEDRINE SULFATE 50 MG/ML IJ SOLN
INTRAMUSCULAR | Status: DC | PRN
  Administered 2020-02-26: 10 mg via INTRAVENOUS
  Administered 2020-02-26: 15 mg via INTRAVENOUS
  Administered 2020-02-26: 10 mg via INTRAVENOUS

## 2020-02-26 MED ORDER — BUPIVACAINE-EPINEPHRINE (PF) 0.5% -1:200000 IJ SOLN
INTRAMUSCULAR | Status: DC | PRN
  Administered 2020-02-26: 30 mL via PERINEURAL

## 2020-02-26 MED ORDER — CEFAZOLIN SODIUM-DEXTROSE 2-4 GM/100ML-% IV SOLN
2.0000 g | INTRAVENOUS | Status: AC
  Administered 2020-02-26: 2 g via INTRAVENOUS

## 2020-02-26 MED ORDER — EPINEPHRINE PF 1 MG/ML IJ SOLN
INTRAMUSCULAR | Status: AC
  Filled 2020-02-26: qty 1

## 2020-02-26 MED ORDER — ONDANSETRON HCL 4 MG/2ML IJ SOLN
INTRAMUSCULAR | Status: AC
  Filled 2020-02-26: qty 2

## 2020-02-26 MED ORDER — FENTANYL CITRATE (PF) 100 MCG/2ML IJ SOLN
25.0000 ug | INTRAMUSCULAR | Status: DC | PRN
Start: 2020-02-26 — End: 2020-02-26

## 2020-02-26 MED ORDER — FENTANYL CITRATE (PF) 100 MCG/2ML IJ SOLN
INTRAMUSCULAR | Status: AC
  Filled 2020-02-26: qty 2

## 2020-02-26 MED ORDER — DEXAMETHASONE SODIUM PHOSPHATE 10 MG/ML IJ SOLN
INTRAMUSCULAR | Status: DC | PRN
  Administered 2020-02-26: 10 mg via INTRAVENOUS

## 2020-02-26 MED ORDER — BUPIVACAINE LIPOSOME 1.3 % IJ SUSP
INTRAMUSCULAR | Status: DC | PRN
  Administered 2020-02-26: 20 mL via PERINEURAL

## 2020-02-26 MED ORDER — SODIUM CHLORIDE (PF) 0.9 % IJ SOLN
INTRAMUSCULAR | Status: AC
  Filled 2020-02-26: qty 50

## 2020-02-26 MED ORDER — TRANEXAMIC ACID 1000 MG/10ML IV SOLN
INTRAVENOUS | Status: DC | PRN
  Administered 2020-02-26: 1000 mg via INTRAVENOUS

## 2020-02-26 MED ORDER — ONDANSETRON HCL 4 MG PO TABS: 4.0000 mg | ORAL_TABLET | Freq: Four times a day (QID) | ORAL | Status: DC | PRN

## 2020-02-26 MED ORDER — LIDOCAINE HCL (PF) 1 % IJ SOLN
INTRAMUSCULAR | Status: AC
  Filled 2020-02-26: qty 5

## 2020-02-26 MED ORDER — MIDAZOLAM HCL 2 MG/2ML IJ SOLN: 1.0000 mg | INTRAMUSCULAR | Status: DC | PRN

## 2020-02-26 MED ORDER — PHENYLEPHRINE HCL (PRESSORS) 10 MG/ML IV SOLN
INTRAVENOUS | Status: AC
  Filled 2020-02-26: qty 1

## 2020-02-26 MED ORDER — EPHEDRINE 5 MG/ML INJ
INTRAVENOUS | Status: AC
  Filled 2020-02-26: qty 10

## 2020-02-26 MED ORDER — DEXMEDETOMIDINE (PRECEDEX) IN NS 20 MCG/5ML (4 MCG/ML) IV SYRINGE
PREFILLED_SYRINGE | INTRAVENOUS | Status: AC
  Filled 2020-02-26: qty 5

## 2020-02-26 MED ORDER — ORAL CARE MOUTH RINSE: 15.0000 mL | Freq: Once | OROMUCOSAL | Status: AC

## 2020-02-26 MED ORDER — SODIUM CHLORIDE 0.9 % IV SOLN: INTRAVENOUS | Status: DC

## 2020-02-26 MED ORDER — ROCURONIUM BROMIDE 100 MG/10ML IV SOLN
INTRAVENOUS | Status: DC | PRN
  Administered 2020-02-26: 50 mg via INTRAVENOUS

## 2020-02-26 MED ORDER — BUPIVACAINE LIPOSOME 1.3 % IJ SUSP
INTRAMUSCULAR | Status: AC
  Filled 2020-02-26: qty 20

## 2020-02-26 MED ORDER — METOCLOPRAMIDE HCL 5 MG/ML IJ SOLN
5.0000 mg | Freq: Three times a day (TID) | INTRAMUSCULAR | Status: DC | PRN
Start: 2020-02-26 — End: 2020-02-26

## 2020-02-26 MED ORDER — GLYCOPYRROLATE 0.2 MG/ML IJ SOLN
INTRAMUSCULAR | Status: AC
  Filled 2020-02-26: qty 1

## 2020-02-26 MED ORDER — HYDROCODONE-ACETAMINOPHEN 5-325 MG PO TABS: 1.0000 | ORAL_TABLET | Freq: Four times a day (QID) | ORAL | 0 refills | Status: DC | PRN

## 2020-02-26 MED ORDER — INSULIN ASPART 100 UNIT/ML ~~LOC~~ SOLN
SUBCUTANEOUS | Status: AC
  Filled 2020-02-26: qty 1

## 2020-02-26 MED ORDER — LIDOCAINE HCL (PF) 1 % IJ SOLN
INTRAMUSCULAR | Status: DC | PRN
  Administered 2020-02-26: .8 mL

## 2020-02-26 MED ORDER — PROPOFOL 10 MG/ML IV BOLUS
INTRAVENOUS | Status: DC | PRN
  Administered 2020-02-26: 100 mg via INTRAVENOUS

## 2020-02-26 MED ORDER — LIDOCAINE HCL (CARDIAC) PF 100 MG/5ML IV SOSY
PREFILLED_SYRINGE | INTRAVENOUS | Status: DC | PRN
  Administered 2020-02-26: 50 mg via INTRAVENOUS

## 2020-02-26 MED ORDER — ONDANSETRON HCL 4 MG/2ML IJ SOLN
INTRAMUSCULAR | Status: DC | PRN
  Administered 2020-02-26: 4 mg via INTRAVENOUS

## 2020-02-26 MED ORDER — ONDANSETRON HCL 4 MG/2ML IJ SOLN: 4.0000 mg | Freq: Four times a day (QID) | INTRAMUSCULAR | Status: DC | PRN

## 2020-02-26 MED ORDER — FENTANYL CITRATE (PF) 100 MCG/2ML IJ SOLN: 50.0000 ug | INTRAMUSCULAR | Status: DC | PRN

## 2020-02-26 MED ORDER — INSULIN ASPART 100 UNIT/ML ~~LOC~~ SOLN
4.0000 [IU] | Freq: Once | SUBCUTANEOUS | Status: AC
  Administered 2020-02-26: 4 [IU] via SUBCUTANEOUS

## 2020-02-26 MED ORDER — PHENYLEPHRINE HCL (PRESSORS) 10 MG/ML IV SOLN
INTRAVENOUS | Status: DC | PRN
  Administered 2020-02-26 (×3): 100 ug via INTRAVENOUS
  Administered 2020-02-26: 50 ug via INTRAVENOUS

## 2020-02-26 MED ORDER — BUPIVACAINE HCL (PF) 0.5 % IJ SOLN
INTRAMUSCULAR | Status: AC
  Filled 2020-02-26: qty 30

## 2020-02-26 MED ORDER — FENTANYL CITRATE (PF) 100 MCG/2ML IJ SOLN
INTRAMUSCULAR | Status: AC
  Administered 2020-02-26: 50 ug via INTRAVENOUS
  Filled 2020-02-26: qty 2

## 2020-02-26 MED ORDER — SUGAMMADEX SODIUM 200 MG/2ML IV SOLN
INTRAVENOUS | Status: DC | PRN
  Administered 2020-02-26: 200 mg via INTRAVENOUS

## 2020-02-26 MED ORDER — DEXMEDETOMIDINE (PRECEDEX) IN NS 20 MCG/5ML (4 MCG/ML) IV SYRINGE
PREFILLED_SYRINGE | INTRAVENOUS | Status: DC | PRN
  Administered 2020-02-26: 8 ug via INTRAVENOUS
  Administered 2020-02-26: 12 ug via INTRAVENOUS

## 2020-02-26 MED ORDER — LIDOCAINE HCL (PF) 2 % IJ SOLN
INTRAMUSCULAR | Status: AC
  Filled 2020-02-26: qty 5

## 2020-02-26 MED ORDER — CHLORHEXIDINE GLUCONATE 0.12 % MT SOLN: 15.0000 mL | Freq: Once | OROMUCOSAL | Status: AC

## 2020-02-26 MED ORDER — PROPOFOL 10 MG/ML IV BOLUS
INTRAVENOUS | Status: AC
  Filled 2020-02-26: qty 20

## 2020-02-26 MED ORDER — ROCURONIUM BROMIDE 10 MG/ML (PF) SYRINGE
PREFILLED_SYRINGE | INTRAVENOUS | Status: AC
  Filled 2020-02-26: qty 10

## 2020-02-26 MED ORDER — FENTANYL CITRATE (PF) 100 MCG/2ML IJ SOLN
INTRAMUSCULAR | Status: DC | PRN
  Administered 2020-02-26 (×2): 50 ug via INTRAVENOUS

## 2020-02-26 MED ORDER — CEFAZOLIN SODIUM-DEXTROSE 2-4 GM/100ML-% IV SOLN: 2.0000 g | Freq: Four times a day (QID) | INTRAVENOUS | Status: DC

## 2020-02-26 MED ORDER — CHLORHEXIDINE GLUCONATE 0.12 % MT SOLN
OROMUCOSAL | Status: AC
  Administered 2020-02-26: 15 mL via OROMUCOSAL
  Filled 2020-02-26: qty 15

## 2020-02-26 SURGICAL SUPPLY — 70 items
APL PRP STRL LF DISP 70% ISPRP (MISCELLANEOUS) ×1
BASEPLATE GLENOSPHERE 25 (Plate) ×2 IMPLANT
BEARING HUMERAL SHLDER 36M STD (Shoulder) ×1 IMPLANT
BIT DRILL TWIST 2.7 (BIT) ×2 IMPLANT
BLADE SAW SAG 25X90X1.19 (BLADE) ×2 IMPLANT
BRNG HUM STD 36 RVRS SHLDR (Shoulder) ×1 IMPLANT
CANISTER SUCT 1200ML W/VALVE (MISCELLANEOUS) ×2 IMPLANT
CANISTER SUCT 3000ML PPV (MISCELLANEOUS) ×4 IMPLANT
CHLORAPREP W/TINT 26 (MISCELLANEOUS) ×2 IMPLANT
COOLER POLAR GLACIER W/PUMP (MISCELLANEOUS) ×2 IMPLANT
COVER BACK TABLE REUSABLE LG (DRAPES) ×2 IMPLANT
COVER WAND RF STERILE (DRAPES) ×2 IMPLANT
DRAPE 3/4 80X56 (DRAPES) ×4 IMPLANT
DRAPE IMP U-DRAPE 54X76 (DRAPES) ×4 IMPLANT
DRAPE INCISE IOBAN 66X45 STRL (DRAPES) ×4 IMPLANT
DRSG OPSITE POSTOP 4X8 (GAUZE/BANDAGES/DRESSINGS) ×2 IMPLANT
ELECT BLADE 6.5 EXT (BLADE) IMPLANT
ELECT CAUTERY BLADE 6.4 (BLADE) ×2 IMPLANT
GLENOID SPHERE STD STRL 36MM (Orthopedic Implant) ×2 IMPLANT
GLOVE INDICATOR 8.0 STRL GRN (GLOVE) ×2 IMPLANT
GLOVE SRG 8 PF TXTR STRL LF DI (GLOVE) ×1 IMPLANT
GLOVE SURG ENC MOIS LTX SZ7.5 (GLOVE) ×8 IMPLANT
GLOVE SURG ENC MOIS LTX SZ8 (GLOVE) ×8 IMPLANT
GLOVE SURG UNDER POLY LF SZ8 (GLOVE) ×2
GOWN STRL REUS W/ TWL LRG LVL3 (GOWN DISPOSABLE) ×1 IMPLANT
GOWN STRL REUS W/ TWL XL LVL3 (GOWN DISPOSABLE) ×1 IMPLANT
GOWN STRL REUS W/TWL LRG LVL3 (GOWN DISPOSABLE) ×2
GOWN STRL REUS W/TWL XL LVL3 (GOWN DISPOSABLE) ×2
HOOD PEEL AWAY FLYTE STAYCOOL (MISCELLANEOUS) ×6 IMPLANT
ILLUMINATOR WAVEGUIDE N/F (MISCELLANEOUS) IMPLANT
KIT STABILIZATION SHOULDER (MISCELLANEOUS) ×2 IMPLANT
KIT TURNOVER KIT A (KITS) ×2 IMPLANT
MANIFOLD NEPTUNE II (INSTRUMENTS) ×2 IMPLANT
MASK FACE SPIDER DISP (MASK) ×2 IMPLANT
MAT ABSORB  FLUID 56X50 GRAY (MISCELLANEOUS) ×1
MAT ABSORB FLUID 56X50 GRAY (MISCELLANEOUS) ×1 IMPLANT
NDL SAFETY ECLIPSE 18X1.5 (NEEDLE) ×1 IMPLANT
NEEDLE HYPO 18GX1.5 SHARP (NEEDLE) ×2
NEEDLE HYPO 22GX1.5 SAFETY (NEEDLE) ×2 IMPLANT
NEEDLE SPNL 20GX3.5 QUINCKE YW (NEEDLE) ×4 IMPLANT
NS IRRIG 500ML POUR BTL (IV SOLUTION) ×2 IMPLANT
PACK ARTHROSCOPY SHOULDER (MISCELLANEOUS) ×2 IMPLANT
PAD ARMBOARD 7.5X6 YLW CONV (MISCELLANEOUS) ×2 IMPLANT
PAD WRAPON POLAR SHDR UNIV (MISCELLANEOUS) ×1 IMPLANT
PENCIL SMOKE EVACUATOR (MISCELLANEOUS) ×2 IMPLANT
PIN HUMERAL STMN 3.2MMX9IN (INSTRUMENTS) ×2 IMPLANT
PULSAVAC PLUS IRRIG FAN TIP (DISPOSABLE) ×2
SCREW BONE LOCKING 4.75X35X3.5 (Screw) ×2 IMPLANT
SCREW BONE STRL 6.5MMX25MM (Screw) ×2 IMPLANT
SCREW LOCKING 4.75MMX15MM (Screw) ×4 IMPLANT
SCREW LOCKING NS 4.75MMX20MM (Screw) ×2 IMPLANT
SHOULDER HUMERAL BEAR 36M STD (Shoulder) ×2 IMPLANT
SLING ULTRA II M (MISCELLANEOUS) ×2 IMPLANT
SOL .9 NS 3000ML IRR  AL (IV SOLUTION) ×1
SOL .9 NS 3000ML IRR AL (IV SOLUTION) ×1
SOL .9 NS 3000ML IRR UROMATIC (IV SOLUTION) ×1 IMPLANT
SPONGE LAP 18X18 RF (DISPOSABLE) ×2 IMPLANT
STAPLER SKIN PROX 35W (STAPLE) ×2 IMPLANT
STEM HUMERAL STRL 10MMX55MM (Stem) ×2 IMPLANT
SUT ETHIBOND 0 MO6 C/R (SUTURE) ×2 IMPLANT
SUT FIBERWIRE #2 38 BLUE 1/2 (SUTURE) ×8
SUT VIC AB 0 CT1 36 (SUTURE) ×2 IMPLANT
SUT VIC AB 2-0 CT1 (SUTURE) ×2 IMPLANT
SUT VIC AB 2-0 CT1 27 (SUTURE) ×4
SUT VIC AB 2-0 CT1 TAPERPNT 27 (SUTURE) ×2 IMPLANT
SUTURE FIBERWR #2 38 BLUE 1/2 (SUTURE) ×4 IMPLANT
SYR 10ML LL (SYRINGE) ×2 IMPLANT
TIP FAN IRRIG PULSAVAC PLUS (DISPOSABLE) ×1 IMPLANT
TRAY HUM MINI SHOULDER +0 40D (Shoulder) ×2 IMPLANT
WRAPON POLAR PAD SHDR UNIV (MISCELLANEOUS) ×2

## 2020-02-26 NOTE — Transfer of Care (Signed)
Immediate Anesthesia Transfer of Care Note  Patient: Emily Ryan  Procedure(s) Performed: REVERSE SHOULDER ARTHROPLASTY (Right Shoulder)  Patient Location: PACU  Anesthesia Type:General  Level of Consciousness: sedated  Airway & Oxygen Therapy: Patient Spontanous Breathing and Patient connected to face mask oxygen  Post-op Assessment: Report given to RN and Post -op Vital signs reviewed and stable  Post vital signs: Reviewed  Last Vitals:  Vitals Value Taken Time  BP    Temp    Pulse 88 02/26/20 1021  Resp    SpO2 98 % 02/26/20 1021  Vitals shown include unvalidated device data.  Last Pain:  Vitals:   02/26/20 0617  TempSrc: Temporal  PainSc: 0-No pain         Complications: No complications documented.

## 2020-02-26 NOTE — Anesthesia Postprocedure Evaluation (Signed)
Anesthesia Post Note  Patient: Emily Ryan  Procedure(s) Performed: REVERSE SHOULDER ARTHROPLASTY (Right Shoulder)  Patient location during evaluation: PACU Anesthesia Type: General Level of consciousness: awake and alert Pain management: pain level controlled Vital Signs Assessment: post-procedure vital signs reviewed and stable Respiratory status: spontaneous breathing and respiratory function stable Cardiovascular status: stable Anesthetic complications: no   No complications documented.   Last Vitals:  Vitals:   02/26/20 1115 02/26/20 1129  BP: 128/69 (!) 131/59  Pulse: (!) 101 99  Resp: 11 18  Temp:  (!) 36.4 C  SpO2: 93% 99%    Last Pain:  Vitals:   02/26/20 1129  TempSrc: Temporal  PainSc:                  Emily Ryan

## 2020-02-26 NOTE — H&P (Signed)
History of Present Illness: Emily Ryan is a 75 y.o. female who presents today for her surgical history and physical for upcoming right reverse total shoulder arthroplasty. Surgery is scheduled with Dr. Roland Rack on 02/26/2020. The patient denies any changes in her medical history since she was last evaluated. She denies any numbness or tingling to the right upper extremity at today's appointment. She has a history of a large and repairable tear of the right supraspinatus tendon. The patient denies any personal history of heart attack, stroke, asthma or COPD. Patient denies any history of blood clots. The patient is a diabetic.  Past Medical History: . Complicated pregnancy  1st pregnancy complicated by post operative hemorrhage and 2ng complicated by epidural  . Diabetes mellitus (CMS-HCC)  . Diverticulosis of colon  . Fracture of malleolus of right ankle, closed, with routine healing, subsequent encounter 12/09/2017  . GERD (gastroesophageal reflux disease)  . Guttate psoriasis  . Hyperlipidemia  . Hypertension  . Rectal stricture  . Skin cancer   Past Surgical History: . ABDOMINAL HYSTERECTOMY  . APPENDECTOMY 2012  . BLADDER LIFT/REPAIR 1991  . COLON RESECTION SIGMOID 02/19/2018  Procedure: LAPAROSCOPIC SIGMOID COLECTOMY- POSSIBLE OPEN; Surgeon: Olean Ree, MD; Location: [facility]  . COLONOSCOPY WITH PROPOFOL 07/17/2017  Procedure: COLONOSCOPY WITH PROPOFOL; Surgeon: Lin Landsman, MD; Location: [facility]  . COLONOSCOPY WITH PROPOFOL 11/02/2017  Procedure: COLONOSCOPY WITH PROPOFOL; Surgeon: Lin Landsman, MD; Location: [facility];  . CYSTOCELE REPAIR  . CYSTOSCOPY W/ URETERAL STENT PLACEMENT Bilateral 02/19/2018  Procedure: CYSTOSCOPY WITH STENT PLACEMENT GOING 1ST; Surgeon: Billey Co, MD; Location: [facility]; Service: Urology; Laterality: Bilateral;  . FLEXIBLE SIGMOIDOSCOPY 07/31/2017  Procedure: FLEXIBLE SIGMOIDOSCOPY; Surgeon: Lin Landsman,  MD; Location: [facility]  . HERNIA REPAIR  . MOHS SURGERY 2017 - Face  . PARTIAL COLECTOMY 2012 - left, secondary to diverticular perf  . REPAIR RECTOCELE  . SEPTOPLASTY 1969  . TEE WITHOUT CARDIOVERSION 11/24/2015  Procedure: TRANSESOPHAGEAL ECHOCARDIOGRAM (TEE); Surgeon: Minna Merritts, MD; Location: [facility]; Service: Cardiovascular;  . TONSILLECTOMY 1953  . VARICOSE VEIN SURGERY Right 1974   Past Family History: . Heart disease Mother  . Macular degeneration Mother  . Cancer Father  . Diabetes Sister  . Dementia Sister  . Diabetes Brother  . Stroke Brother  . Colon cancer Maternal Grandmother   Medications: . amLODIPine (NORVASC) 2.5 MG tablet Take 2.5 mg by mouth once daily  . blood glucose diagnostic (FREESTYLE LITE STRIPS) test strip once daily  . calcium carbonate-vit D3-min 600 mg calcium- 400 unit Tab Take 1 tablet by mouth once daily  . clobetasoL (TEMOVATE) 0.05 % cream APPLY EXTERNALLY TO THE AFFECTED AREA OF NON-FACIAL PSORIASIS TWICE DAILY UNTIL CLEAR.  . conjugated estrogens (PREMARIN) 0.625 mg/gram vaginal cream Place vaginally INSERT 1 APPLICATORFUL VAGINALLY DAILY AS DIRECTED  . dulaglutide (TRULICITY) 1.5 UM/3.5 mL subcutaneous injection Inject subcutaneously INJECT 0.5 MILLILITERS (1.5 MG TOTAL) SUB-CUTANEOUSLY ONCE A WEEK.  Marland Kitchen escitalopram oxalate (LEXAPRO) 5 MG tablet Take 5 mg by mouth every evening  . ezetimibe (ZETIA) 10 mg tablet Take 1 tablet by mouth once daily  . folic acid (FOLVITE) 1 MG tablet Take by mouth TAKE 1 TABLET (1 MG TOTAL) BY MOUTH DAILY EXCEPT ON SATURDAY  . glipiZIDE (GLUCOTROL XL) 5 MG XL tablet Take by mouth Take 1 tablet (5 mg total) by mouth daily with breakfast.  . hydroCHLOROthiazide (HYDRODIURIL) 12.5 MG tablet Take by mouth TAKE 1 TABLET (12.5 MG TOTAL) BY MOUTH DAILY  .  loratadine (CLARITIN) 10 mg tablet Take 1 tablet by mouth once daily  . losartan (COZAAR) 100 MG tablet Take 100 mg by mouth once daily  . metFORMIN  (GLUCOPHAGE-XR) 750 MG XR tablet Take by mouth Take 2 tablets (1,500 mg total) by mouth daily with breakfast.  . methotrexate (RHEUMATREX) 2.5 MG tablet Take by mouth TAKE 3 TABLETS BY MOUTH ONCE A WEEK AS DIRECTED  . montelukast (SINGULAIR) 10 mg tablet Take by mouth Take 1 tablet (10 mg total) by mouth at bedtime.  . multivitamin capsule Take 1 capsule by mouth once daily  . omeprazole (PRILOSEC) 20 MG DR capsule Take 1 capsule by mouth once daily  . simvastatin (ZOCOR) 20 MG tablet Take 1 tablet by mouth nightly  . vitamin E 400 UNIT capsule Take by mouth Take 400 Units by mouth daily.  Marland Kitchen ascorbic acid, vitamin C, (VITAMIN C) 1000 MG tablet Take by mouth Take 1,000 mg by mouth daily. (Patient not taking: Reported on 02/21/2020 )  . aspirin 81 MG EC tablet Take 81 mg by mouth once daily (Patient not taking: Reported on 02/21/2020 )  . azithromycin (ZITHROMAX) 250 MG tablet (Patient not taking: Reported on 02/21/2020 )  . benzonatate (TESSALON) 100 MG capsule benzonatate 100 mg capsule (Patient not taking: Reported on 02/21/2020)  . calcium carbonate 600 mg calcium (1,500 mg) Tab tablet Take 1 tablet by mouth 2 (two) times daily (Patient not taking: Reported on 02/21/2020 )  . ciprofloxacin HCl (CIPRO) 250 MG tablet (Patient not taking: Reported on 02/21/2020 )  . docosahexaenoic acid-epa 120-180 mg Cap Take 1 capsule by mouth once daily (Patient not taking: Reported on 02/21/2020 )  . oseltamivir (TAMIFLU) 75 MG capsule Take 75 mg by mouth 2 (two) times daily (Patient not taking: Reported on 02/21/2020 )   Allergies: No Known Allergies   Review of Systems:  A comprehensive 14 point ROS was performed, reviewed by me today, and the pertinent orthopaedic findings are documented in the HPI.  Physical Exam: BP 132/82 (BP Location: Left upper arm, Patient Position: Sitting, BP Cuff Size: Large Adult)  Ht 149.9 cm (4\' 11" )  Wt 62.1 kg (137 lb)  BMI 27.67 kg/m  General/Constitutional: The patient  appears to be well-nourished, well-developed, and in no acute distress. Neuro/Psych: Normal mood and affect, oriented to person, place and time. Eyes: Non-icteric. Pupils are equal, round, and reactive to light, and exhibit synchronous movement. ENT: Unremarkable. Lymphatic: No palpable adenopathy. Respiratory: Lungs clear to auscultation, Normal chest excursion, No wheezes and Non-labored breathing Cardiovascular: Regular rate and rhythm. No murmurs. and No edema, swelling or tenderness, except as noted in detailed exam. Integumentary: No impressive skin lesions present, except as noted in detailed exam. Musculoskeletal: Unremarkable, except as noted in detailed exam.  Rightshoulder exam: SKIN:Normal SWELLING:None WARMTH:None LYMPH NODES: No adenopathy palpable CREPITUS:None TENDERNESS:Minimally tender over the anterior and superior aspects of the shoulder ROM (active):  Forward flexion:160degrees Abduction: 155 degrees Internal rotation: L1 ROM (passive):  Forward flexion: 165 degrees Abduction: 160 degrees ER/IR at 90 abd: 100 degrees/70 degrees  She experiences mild pain with internal rotation at 90 degrees of abduction, and minimal discomfort with forward flexion, abduction, and internal rotation.  STRENGTH: Forward flexion: 4/5 Abduction: 4-4+/5 External rotation: 4-4+/5 Internal rotation: 4-4+/5 Pain with RC testing: Mild-moderate pain with resisted forward flexion, and mild pain with resisted abduction.  STABILITY:Normal  SPECIAL TESTS: Luan Pulling' test: Moderately positive Speed's test: Mildly positive Capsulitis - pain w/ passive ER: No Crossed arm test:  Negative Crank: Not  evaluated Anterior apprehension: Negative Posterior apprehension: Not evaluated  She is neurovascularly intact to the right upper extremity and hand.  Imaging: MRI OF THE RIGHT SHOULDER WITHOUT CONTRAST:  1. Large chronic full-thickness retracted supraspinatus tendon tear.  Associated fatty atrophy of the supraspinatus muscle.  2. Significant tendinopathy, interstitial tears and  partial-thickness bursal and articular surface tearing of the  infraspinatus tendon.  3. Small retracted tear involving the upper fibers of the  subscapularis tendon.  4. Torn and retracted long head biceps tendon.  5. Degenerative changes involving the superior labrum. I believe  part of the torn biceps tendon is still attached to the glenoid  labrum.   Impression: 1. Rotator cuff arthropathy, right. 2. Nontraumatic complete tear of right rotator cuff.  Plan:  1. Treatment options were discussed today with the patient. 2. The patient is scheduled for a right reverse total shoulder arthroplasty with Dr. Roland Rack on 02/26/2020. 3. The patient was instructed on the risk and benefits of surgery and wishes to proceed at this time. 4. The patient was offered and received a right slingshot shoulder sling to wear following surgery. 5. This document will serve as a surgical history and physical for the patient. 6. The patient will follow-up per standard postop protocol. 7. They can call the clinic they have any questions, new symptoms develop or symptoms worsen.  The procedure was discussed with the patient, as were the potential risks (including bleeding, infection, nerve and/or blood vessel injury, persistent or recurrent pain, dislocation, acromial stress fracture, need for further surgery, blood clots, strokes, heart attacks and/or arhythmias, pneumonia, etc.) and benefits. The patient states her understanding and wishes to proceed.   H&P  reviewed and patient re-examined. No changes.

## 2020-02-26 NOTE — Discharge Instructions (Addendum)
Orthopedic discharge instructions: May shower with intact op-site dressing once nerve block has worn off. Apply ice frequently to shoulder or use Polar Care device. Take ibuprofen 600 mg TID with meals for 7-10 days, then as necessary. Take pain medication as prescribed when needed.  May supplement with ES Tylenol if necessary. Keep shoulder immobilizer on at all times except may remove for bathing purposes. Follow-up in 10-14 days or as scheduled.  AMBULATORY SURGERY  DISCHARGE INSTRUCTIONS  1) The drugs that you were given will stay in your system until tomorrow so for the next 24 hours you should not: A) Drive an automobile B) Make any legal decisions C) Drink any alcoholic beverage  2) You may resume regular meals tomorrow.  Today it is better to start with liquids and gradually work up to solid foods. You may eat anything you prefer, but it is better to start with liquids, then soup and crackers, and gradually work up to solid foods.  3) Please notify your doctor immediately if you have any unusual bleeding, trouble breathing, redness and pain at the surgery site, drainage, fever, or pain not relieved by medication.  4) Additional Instructions:   Please contact your physician with any problems or Same Day Surgery at [phone], Monday through Friday 6 am to 4 pm, or [place] at [facility] number at [phone].     Interscalene Nerve Block with Exparel  1.  For your surgery you have received an Interscalene Nerve Block with Exparel. 2. Nerve Blocks affect many types of nerves, including nerves that control movement, pain and normal sensation.  You may experience feelings such as numbness, tingling, heaviness, weakness or the inability to move your arm or the feeling or sensation that your arm has "fallen asleep". 3. A nerve block with Exparel can last up to 5 days.  Usually the weakness wears off first.  The tingling and heaviness usually wear off next.  Finally you may  start to notice pain.  Keep in mind that this may occur in any order.  Once a nerve block starts to wear off it is usually completely gone within 60 minutes. 4. ISNB may cause mild shortness of breath, a hoarse voice, blurry vision, unequal pupils, or drooping of the face on the same side as the nerve block.  These symptoms will usually resolve with the numbness.  Very rarely the procedure itself can cause mild seizures. 5. If needed, your surgeon will give you a prescription for pain medication.  It will take about 60 minutes for the oral pain medication to become fully effective.  So, it is recommended that you start taking this medication before the nerve block first begins to wear off, or when you first begin to feel discomfort. 6. Take your pain medication only as prescribed.  Pain medication can cause sedation and decrease your breathing if you take more than you need for the level of pain that you have. 7. Nausea is a common side effect of many pain medications.  You may want to eat something before taking your pain medicine to prevent nausea. 8. After an Interscalene nerve block, you cannot feel pain, pressure or extremes in temperature in the effected arm.  Because your arm is numb it is at an increased risk for injury.  To decrease the possibility of injury, please practice the following:  a. While you are awake change the position of your arm frequently to prevent too much pressure on any one area for prolonged periods of  time. b.  If you have a cast or tight dressing, check the color or your fingers every couple of hours.  Call your surgeon with the appearance of any discoloration (white or blue). c. If you are given a sling to wear before you go home, please wear it  at all times until the block has completely worn off.  Do not get up at night without your sling. d. Please contact [facility] or your surgeon if you do not begin to regain sensation after 7 days from the surgery.  Anesthesia  may be contacted by calling the Same Day Surgery Department, Mon. through Fri., 6 am to 4 pm at [phone].   e. If you experience any other problems or concerns, please contact your surgeon's office. f. If you experience severe or prolonged shortness of breath go to the nearest emergency department.

## 2020-02-26 NOTE — Op Note (Signed)
02/26/2020  10:37 AM  Patient:   Emily Ryan  Pre-Op Diagnosis:   Impingement/tendinopathy with massive irreparable recurrent rotator cuff tear and cuff arthropathy, right shoulder.  Post-Op Diagnosis:   Same  Procedure:   Reverse right total shoulder arthroplasty.  Surgeon:   Pascal Lux, MD  Assistant:   Lerry Liner, RNFA; Laurina Bustle, PA-S  Anesthesia:   General endotracheal with an interscalene block using Exparel placed preoperatively by the anesthesiologist.  Findings:   As above.  Complications:   None  EBL:   100 cc  Fluids:   500 cc crystalloid  UOP:   None  TT:   None  Drains:   None  Closure:   Staples  Implants:   All press-fit Bi[employer] Comprehensive system with a #10 micro-humeral stem, a 40 mm humeral tray with a standard insert, and a mini-base plate with a 36 mm glenosphere.  Brief Clinical Note:   The patient is a 75 year old female with a long history of progressively worsening right shoulder pain and weakness. Her symptoms have progressed despite medications, activity modification, etc. Her history and examination are consistent with a recurrent rotator cuff tear with cuff arthropathy, all of which were confirmed by MRI scan. The patient presents at this time for a reverse right total shoulder arthroplasty.  Procedure:   The patient underwent placement of an interscalene block using Exparel by the anesthesiologist in the preoperative holding area before being brought into the operating room and lain in the supine position. The patient then underwent general endotracheal intubation and anesthesia before the patient was repositioned in the beach chair position using the beach chair positioner. The right shoulder and upper extremity were prepped with ChloraPrep solution before being draped sterilely. Preoperative antibiotics were administered. A standard anterior approach to the shoulder was made through an approximately 4-5 inch incision. The incision  was carried down through the subcutaneous tissues to expose the deltopectoral fascia. The interval between the deltoid and pectoralis muscles was identified and this plane developed, retracting the cephalic vein laterally with the deltoid muscle. The conjoined tendon was identified. Its lateral margin was dissected and the Kolbel self-retraining retractor inserted. The "three sisters" were identified and cauterized. Bursal tissues were removed to improve visualization. The subscapularis tendon was released from its attachment to the lesser tuberosity 1 cm proximal to its insertion and several tagging sutures placed. The inferior capsule was released with care after identifying and protecting the axillary nerve. The proximal humeral cut was made at approximately 25 of retroversion using the extra-medullary guide.   Attention was redirected to the glenoid. The labrum was debrided circumferentially before the center of the glenoid was marked with electrocautery. The guidewire was drilled into the glenoid neck using the appropriate guide. After verifying its position, it was overreamed with the mini-baseplate reamer to create a flat surface. The permanent mini-baseplate was impacted into place. It was stabilized with a 25 x 6.5 mm central screw and four peripheral locking screws. The permanent 36 mm glenosphere was then impacted into place and its Morse taper locking mechanism verified using manual distraction.  Attention was directed to the humeral side. The humeral canal was reamed sequentially beginning with the end-cutting reamer then progressing from a 4 mm reamer up to a 10 mm reamer. This provided excellent circumferential chatter. The canal was broached beginning with a #8 broach and progressing to a #10 broach. This was left in place and a trial reduction performed using the standard trial humeral platform. The  arm demonstrated excellent range of motion as the hand could be brought across the chest to the  opposite shoulder and brought to the top of the patient's head and to the patient's ear. The shoulder appeared stable throughout this range of motion. The joint was dislocated and the trial components removed. The permanent #10 micro-stem was impacted into place with care taken to maintain the appropriate version. The permanent 40 mm humeral platform with the standard insert was put together on the back table and impacted into place. Again, the [facility] taper locking mechanism was verified using manual distraction. The shoulder was relocated using two finger pressure and again placed through a range of motion with the findings as described above.  The wound was copiously irrigated with sterile saline solution using the jet lavage system before a total of 30 cc of 0.5% Sensorcaine with epinephrine was injected into the pericapsular and peri-incisional tissues to help with postoperative analgesia. The subscapularis tendon was reapproximated using #2 FiberWire interrupted sutures. The deltopectoral interval was closed using #0 Vicryl interrupted sutures before the subcutaneous tissues were closed using 2-0 Vicryl interrupted sutures. The skin was closed using staples. Prior to closing the skin, 1 g of transexemic acid in 10 cc of normal saline was injected intra-articularly to help with postoperative bleeding. A sterile occlusive dressing was applied to the wound before the arm was placed into a shoulder immobilizer with an abduction pillow. A Polar Care system also was applied to the shoulder. The patient was then transferred back to a hospital bed before being awakened, extubated, and returned to the recovery room in satisfactory condition after tolerating the procedure well.

## 2020-02-26 NOTE — Anesthesia Procedure Notes (Signed)
Procedure Name: Intubation Performed by: Rolla Plate, CRNA Pre-anesthesia Checklist: Patient identified, Patient being monitored, Timeout performed, Emergency Drugs available and Suction available Patient Re-evaluated:Patient Re-evaluated prior to induction Oxygen Delivery Method: Circle system utilized Preoxygenation: Pre-oxygenation with 100% oxygen Induction Type: IV induction Ventilation: Mask ventilation without difficulty and Oral airway inserted - appropriate to patient size Laryngoscope Size: 3 and McGraph Grade View: Grade I Tube type: Oral Tube size: 7.0 mm Number of attempts: 1 Airway Equipment and Method: Stylet and Video-laryngoscopy Placement Confirmation: ETT inserted through vocal cords under direct vision,  positive ETCO2 and breath sounds checked- equal and bilateral Secured at: 20 cm Tube secured with: Tape Dental Injury: Teeth and Oropharynx as per pre-operative assessment

## 2020-02-26 NOTE — Evaluation (Signed)
Occupational Therapy Evaluation Patient Details Name: Emily Ryan MRN: [id number] DOB: April 11, 1945 Today's Date: 02/26/2020    History of Present Illness BRYTNEE Ryan is a 75 y.o. female who presents today for her surgical history and physical for upcoming right reverse total shoulder arthroplasty. Surgery is scheduled with Dr. Roland Rack on 02/26/2020. PMH: diabetes, GERD, HTN, skin cancer   Clinical Impression   [name] was seen for an OT evaluation this date. Pt lives c husband in 2 level home, 1/2 bath on main. Prior to surgery, pt was active and independent. Pt has orders for RUE to be immobilized and will be NWBing per MD. Patient presents with impaired strength/ROM, pain, and sensation to RUE with block not completely resolved yet. These impairments result in a decreased ability to perform self care tasks requiring MOD A for UB dressing and bathing and MAX A for application of polar care and sling/immobilizer. Pt instructed in polar care mgt, sling/immobilizer mgt, ROM exercises for RUE (with instructions for no shoulder exercises until full sensation has returned), functional application of RUE NWBing precautions, adaptive strategies for bathing/dressing/toileting/grooming, positioning and considerations for sleep, and home/routines modifications to maximize falls prevention. Handout provided.   Upon initial standing pt reports small incontinence, requesting to use toilet. SBA for toilet t/f and perihygiene with lateral leans. MIN A don/doff pants seated on BSC, pt provided briefs and 2 hospital gowns to wear home. Pt verbalized understanding of all education/training provided. Pt will benefit from skilled OT services to address these limitations and improve independence in daily tasks. Recommend following surgeons plan for services to continue therapy to maximize return to PLOF, minimize falls risk, and minimize caregiver burden.       Follow Up Recommendations  Follow surgeon's  recommendation for DC plan and follow-up therapies    Equipment Recommendations  None recommended by OT    Recommendations for Other Services       Precautions / Restrictions Precautions Precautions: Shoulder Shoulder Interventions: Shoulder sling/immobilizer;Shoulder abduction pillow;Off for dressing/bathing/exercises Restrictions Weight Bearing Restrictions: Yes RUE Weight Bearing: Non weight bearing      Mobility Bed Mobility               General bed mobility comments: received and left in chair    Transfers Overall transfer level: Independent Equipment used: None             General transfer comment: Initial HHA improving to Independent    Balance Overall balance assessment: Mild deficits observed, not formally tested                                         ADL either performed or assessed with clinical judgement   ADL Overall ADL's : Needs assistance/impaired                                       General ADL Comments: MAX A don/doff sling and gown seated EOC. SBA for toilet t/f and perihygiene with lateral leans. MIN A don/doff pants seated EOC. SETUP self-drinking seated EOC                  Pertinent Vitals/Pain Pain Assessment: No/denies pain     Hand Dominance Right   Extremity/Trunk Assessment Upper Extremity Assessment Upper Extremity Assessment: RUE deficits/detail;Overall [facility] for tasks  assessed RUE: Unable to fully assess due to immobilization   Lower Extremity Assessment Lower Extremity Assessment: Overall WFL for tasks assessed       Communication Communication Communication: No difficulties   Cognition Arousal/Alertness: Awake/alert Behavior During Therapy: WFL for tasks assessed/performed Overall Cognitive Status: Within Functional Limits for tasks assessed                                     General Comments       Exercises Exercises: Other exercises Other  Exercises Other Exercises: Pt and family educated re: OT role, DME recs, d/c recs, falls prevention, ECS, sling mgmt, functinoal application of NWBing pcns, adapted dressing/bathing techniques, HEP, polar care mgmt Other Exercises: LBD, UBD, toileting, sling don/doff, stairs training, sit<>stand, sittign.standing balance/tolerance   Shoulder Instructions      Home Living Family/patient expects to be discharged to:: Private residence Living Arrangements: Spouse/significant other Available Help at Discharge: Family;Available 24 hours/day Type of Home: House Home Access: Stairs to enter [employer] of Steps: 4 Entrance Stairs-Rails: Left Home Layout: Two level;1/2 bath on main level Alternate Level Stairs-Number of Steps: flight Alternate Level Stairs-Rails: Left Bathroom Shower/Tub: Walk-in shower         Home Equipment: Shower seat;Grab bars - tub/shower;Hand held shower head (bidet being installed)          Prior Functioning/Environment Level of Independence: Independent                 OT Problem List: Impaired UE functional use         OT Goals(Current goals can be found in the care plan section) Acute Rehab OT Goals Patient Stated Goal: To return home OT Goal Formulation: With patient/family Time For Goal Achievement: 03/11/20 Potential to Achieve Goals: Good   AM-PAC OT "6 Clicks" Daily Activity     Outcome Measure Help from another person eating meals?: A Little Help from another person taking care of personal grooming?: A Little Help from another person toileting, which includes using toliet, bedpan, or urinal?: A Little Help from another person bathing (including washing, rinsing, drying)?: A Little Help from another person to put on and taking off regular upper body clothing?: A Lot Help from another person to put on and taking off regular lower body clothing?: A Little 6 Click Score: 17   End of Session Nurse Communication: Mobility  status  Activity Tolerance: Patient tolerated treatment well Patient left: in chair;with call bell/phone within reach;with family/visitor present  OT Visit Diagnosis: Other abnormalities of gait and mobility (R26.89)                Time: 1235-1330 OT Time Calculation (min): 55 min Charges:  OT General Charges $OT Visit: 1 Visit OT Evaluation $OT Eval Low Complexity: 1 Low OT Treatments $Self Care/Home Management : 53-67 mins  Dessie Coma, M.S. OTR/L  02/26/20, 2:47 PM  ascom [phone]

## 2020-02-26 NOTE — Anesthesia Preprocedure Evaluation (Signed)
Anesthesia Evaluation  Patient identified by MRN, date of birth, ID band Patient awake    Reviewed: Allergy & Precautions, NPO status , Patient's Chart, lab work & pertinent test results  History of Anesthesia Complications Negative for: history of anesthetic complications  Airway Mallampati: II       Dental   Pulmonary neg sleep apnea, neg COPD, Not current smoker,           Cardiovascular hypertension, Pt. on medications (-) Past MI and (-) CHF (-) dysrhythmias (-) Valvular Problems/Murmurs     Neuro/Psych neg Seizures    GI/Hepatic Neg liver ROS, GERD  Medicated and Controlled,  Endo/Other  diabetes, Type 2, Oral Hypoglycemic Agents  Renal/GU negative Renal ROS     Musculoskeletal   Abdominal   Peds  Hematology   Anesthesia Other Findings   Reproductive/Obstetrics                             Anesthesia Physical Anesthesia Plan  ASA: III  Anesthesia Plan: General   Post-op Pain Management: GA combined w/ Regional for post-op pain   Induction: Intravenous  PONV Risk Score and Plan: 3 and Ondansetron, Dexamethasone and Treatment may vary due to age or medical condition  Airway Management Planned: Oral ETT  Additional Equipment:   Intra-op Plan:   Post-operative Plan:   Informed Consent: I have reviewed the patients History and Physical, chart, labs and discussed the procedure including the risks, benefits and alternatives for the proposed anesthesia with the patient or authorized representative who has indicated his/her understanding and acceptance.       Plan Discussed with:   Anesthesia Plan Comments:         Anesthesia Quick Evaluation

## 2020-02-26 NOTE — Anesthesia Procedure Notes (Signed)
Anesthesia Regional Block: Interscalene brachial plexus block   Pre-Anesthetic Checklist: ,, timeout performed, Correct Patient, Correct Site, Correct Laterality, Correct Procedure, Correct Position, site marked, Risks and benefits discussed,  Surgical consent,  Pre-op evaluation,  At surgeon's request and post-op pain management  Laterality: Upper and Right  Prep: chloraprep       Needles:  Injection technique: Single-shot  Needle Type: Echogenic Stimulator Needle     Needle Length: 10cm  Needle Gauge: 22     Additional Needles:   Procedures:,,,, ultrasound used (permanent image in chart),,,,   Nerve Stimulator or Paresthesia:  Response: biceps, 0.8 mA,   Additional Responses:   Narrative:  Start time: 02/26/2020 7:15 AM End time: 02/26/2020 7:17 AM  Performed by: Personally  Anesthesiologist: Molli Barrows, MD  Additional Notes: Pt. Identified and accepting of procedure after risks and benefits fully reviewed and questions answered. Time out performed and laterality confirmed prior to procedure.  ISNB  performed without difficulty and well tolerated.  Neg IV and SATD.  No pain on injection of Local anesthetic and VSST.

## 2020-02-27 ENCOUNTER — Encounter: Payer: Self-pay | Admitting: Surgery

## 2020-02-28 DIAGNOSIS — K5730 Diverticulosis of large intestine without perforation or abscess without bleeding: Secondary | ICD-10-CM | POA: Diagnosis not present

## 2020-02-28 DIAGNOSIS — Z7984 Long term (current) use of oral hypoglycemic drugs: Secondary | ICD-10-CM | POA: Diagnosis not present

## 2020-02-28 DIAGNOSIS — Z79899 Other long term (current) drug therapy: Secondary | ICD-10-CM | POA: Diagnosis not present

## 2020-02-28 DIAGNOSIS — K219 Gastro-esophageal reflux disease without esophagitis: Secondary | ICD-10-CM | POA: Diagnosis not present

## 2020-02-28 DIAGNOSIS — E119 Type 2 diabetes mellitus without complications: Secondary | ICD-10-CM | POA: Diagnosis not present

## 2020-02-28 DIAGNOSIS — L404 Guttate psoriasis: Secondary | ICD-10-CM | POA: Diagnosis not present

## 2020-02-28 DIAGNOSIS — Z85828 Personal history of other malignant neoplasm of skin: Secondary | ICD-10-CM | POA: Diagnosis not present

## 2020-02-28 DIAGNOSIS — I10 Essential (primary) hypertension: Secondary | ICD-10-CM | POA: Diagnosis not present

## 2020-02-28 DIAGNOSIS — K624 Stenosis of anus and rectum: Secondary | ICD-10-CM | POA: Diagnosis not present

## 2020-02-28 DIAGNOSIS — Z471 Aftercare following joint replacement surgery: Secondary | ICD-10-CM | POA: Diagnosis not present

## 2020-02-28 DIAGNOSIS — E785 Hyperlipidemia, unspecified: Secondary | ICD-10-CM | POA: Diagnosis not present

## 2020-02-28 DIAGNOSIS — Z96611 Presence of right artificial shoulder joint: Secondary | ICD-10-CM | POA: Diagnosis not present

## 2020-03-02 DIAGNOSIS — Z471 Aftercare following joint replacement surgery: Secondary | ICD-10-CM | POA: Diagnosis not present

## 2020-03-05 ENCOUNTER — Other Ambulatory Visit: Payer: Self-pay | Admitting: Internal Medicine

## 2020-03-05 DIAGNOSIS — Z96611 Presence of right artificial shoulder joint: Secondary | ICD-10-CM | POA: Diagnosis not present

## 2020-03-05 DIAGNOSIS — M25511 Pain in right shoulder: Secondary | ICD-10-CM | POA: Diagnosis not present

## 2020-03-05 DIAGNOSIS — M6281 Muscle weakness (generalized): Secondary | ICD-10-CM | POA: Diagnosis not present

## 2020-03-05 DIAGNOSIS — M25611 Stiffness of right shoulder, not elsewhere classified: Secondary | ICD-10-CM | POA: Diagnosis not present

## 2020-03-05 DIAGNOSIS — E1165 Type 2 diabetes mellitus with hyperglycemia: Secondary | ICD-10-CM

## 2020-03-10 DIAGNOSIS — K5731 Diverticulosis of large intestine without perforation or abscess with bleeding: Secondary | ICD-10-CM | POA: Insufficient documentation

## 2020-03-10 DIAGNOSIS — K913 Postprocedural intestinal obstruction, unspecified as to partial versus complete: Secondary | ICD-10-CM

## 2020-03-12 DIAGNOSIS — M25511 Pain in right shoulder: Secondary | ICD-10-CM | POA: Diagnosis not present

## 2020-03-12 DIAGNOSIS — Z96611 Presence of right artificial shoulder joint: Secondary | ICD-10-CM | POA: Diagnosis not present

## 2020-03-13 MED ORDER — OMEPRAZOLE 20 MG PO CPDR: 20.0000 mg | DELAYED_RELEASE_CAPSULE | Freq: Every day | ORAL | 1 refills | Status: DC

## 2020-03-13 MED ORDER — ESCITALOPRAM OXALATE 5 MG PO TABS: 5.0000 mg | ORAL_TABLET | Freq: Every evening | ORAL | 1 refills | Status: DC

## 2020-03-13 MED ORDER — EZETIMIBE 10 MG PO TABS: 10.0000 mg | ORAL_TABLET | Freq: Every day | ORAL | 1 refills | Status: DC

## 2020-03-13 MED ORDER — LOSARTAN POTASSIUM 100 MG PO TABS: 100.0000 mg | ORAL_TABLET | Freq: Every day | ORAL | 1 refills | Status: DC

## 2020-03-13 MED ORDER — FOLIC ACID 1 MG PO TABS: 1.0000 mg | ORAL_TABLET | ORAL | 3 refills | Status: DC

## 2020-03-13 MED ORDER — LORATADINE 10 MG PO TABS: 10.0000 mg | ORAL_TABLET | Freq: Every day | ORAL | 1 refills | Status: DC

## 2020-03-19 DIAGNOSIS — Z96611 Presence of right artificial shoulder joint: Secondary | ICD-10-CM | POA: Diagnosis not present

## 2020-03-19 DIAGNOSIS — M25511 Pain in right shoulder: Secondary | ICD-10-CM | POA: Diagnosis not present

## 2020-03-25 DIAGNOSIS — C4441 Basal cell carcinoma of skin of scalp and neck: Secondary | ICD-10-CM | POA: Diagnosis not present

## 2020-03-26 DIAGNOSIS — Z96611 Presence of right artificial shoulder joint: Secondary | ICD-10-CM | POA: Diagnosis not present

## 2020-03-26 DIAGNOSIS — M25511 Pain in right shoulder: Secondary | ICD-10-CM | POA: Diagnosis not present

## 2020-04-01 DIAGNOSIS — Z96611 Presence of right artificial shoulder joint: Secondary | ICD-10-CM | POA: Diagnosis not present

## 2020-04-01 DIAGNOSIS — M25511 Pain in right shoulder: Secondary | ICD-10-CM | POA: Diagnosis not present

## 2020-04-06 DIAGNOSIS — Z96611 Presence of right artificial shoulder joint: Secondary | ICD-10-CM | POA: Diagnosis not present

## 2020-04-07 DIAGNOSIS — Z96611 Presence of right artificial shoulder joint: Secondary | ICD-10-CM | POA: Diagnosis not present

## 2020-04-07 DIAGNOSIS — M25511 Pain in right shoulder: Secondary | ICD-10-CM | POA: Diagnosis not present

## 2020-04-08 DIAGNOSIS — C44519 Basal cell carcinoma of skin of other part of trunk: Secondary | ICD-10-CM | POA: Diagnosis not present

## 2020-04-10 DIAGNOSIS — M25511 Pain in right shoulder: Secondary | ICD-10-CM | POA: Diagnosis not present

## 2020-04-10 DIAGNOSIS — Z96611 Presence of right artificial shoulder joint: Secondary | ICD-10-CM | POA: Diagnosis not present

## 2020-04-14 DIAGNOSIS — Z96611 Presence of right artificial shoulder joint: Secondary | ICD-10-CM | POA: Diagnosis not present

## 2020-04-14 DIAGNOSIS — M25511 Pain in right shoulder: Secondary | ICD-10-CM | POA: Diagnosis not present

## 2020-04-16 ENCOUNTER — Other Ambulatory Visit (INDEPENDENT_AMBULATORY_CARE_PROVIDER_SITE_OTHER): Payer: Medicare Other

## 2020-04-16 ENCOUNTER — Other Ambulatory Visit: Payer: Self-pay

## 2020-04-16 DIAGNOSIS — Z96611 Presence of right artificial shoulder joint: Secondary | ICD-10-CM | POA: Diagnosis not present

## 2020-04-16 DIAGNOSIS — E1165 Type 2 diabetes mellitus with hyperglycemia: Secondary | ICD-10-CM

## 2020-04-16 DIAGNOSIS — M25511 Pain in right shoulder: Secondary | ICD-10-CM | POA: Diagnosis not present

## 2020-04-16 LAB — COMPREHENSIVE METABOLIC PANEL
ALT: 32 U/L (ref 0–35)
AST: 22 U/L (ref 0–37)
Albumin: 3.9 g/dL (ref 3.5–5.2)
Alkaline Phosphatase: 91 U/L (ref 39–117)
BUN: 13 mg/dL (ref 6–23)
CO2: 30 mEq/L (ref 19–32)
Calcium: 9.1 mg/dL (ref 8.4–10.5)
Chloride: 100 mEq/L (ref 96–112)
Creatinine, Ser: 0.63 mg/dL (ref 0.40–1.20)
GFR: 86.75 mL/min (ref >60.00)
Glucose, Bld: 175 mg/dL — ABNORMAL HIGH (ref 70–99)
Potassium: 4 mEq/L (ref 3.5–5.1)
Sodium: 138 mEq/L (ref 135–145)
Total Bilirubin: 0.7 mg/dL (ref 0.2–1.2)
Total Protein: 6.8 g/dL (ref 6.0–8.3)

## 2020-04-16 LAB — HEMOGLOBIN A1C: Hgb A1c MFr Bld: 7.5 % — ABNORMAL HIGH (ref 4.6–6.5)

## 2020-04-20 DIAGNOSIS — Z96611 Presence of right artificial shoulder joint: Secondary | ICD-10-CM | POA: Diagnosis not present

## 2020-04-20 DIAGNOSIS — M25511 Pain in right shoulder: Secondary | ICD-10-CM | POA: Diagnosis not present

## 2020-04-22 ENCOUNTER — Encounter: Payer: Self-pay | Admitting: Internal Medicine

## 2020-04-22 ENCOUNTER — Ambulatory Visit (INDEPENDENT_AMBULATORY_CARE_PROVIDER_SITE_OTHER): Payer: Medicare Other | Admitting: Internal Medicine

## 2020-04-22 ENCOUNTER — Other Ambulatory Visit: Payer: Self-pay

## 2020-04-22 VITALS — BP 136/68 | HR 81 | Temp 98.6°F | Resp 16 | Ht 59.0 in | Wt 135.2 lb

## 2020-04-22 DIAGNOSIS — Z96611 Presence of right artificial shoulder joint: Secondary | ICD-10-CM

## 2020-04-22 DIAGNOSIS — E1165 Type 2 diabetes mellitus with hyperglycemia: Secondary | ICD-10-CM | POA: Diagnosis not present

## 2020-04-22 DIAGNOSIS — E559 Vitamin D deficiency, unspecified: Secondary | ICD-10-CM

## 2020-04-22 DIAGNOSIS — E1169 Type 2 diabetes mellitus with other specified complication: Secondary | ICD-10-CM | POA: Diagnosis not present

## 2020-04-22 DIAGNOSIS — E785 Hyperlipidemia, unspecified: Secondary | ICD-10-CM | POA: Diagnosis not present

## 2020-04-22 DIAGNOSIS — M25511 Pain in right shoulder: Secondary | ICD-10-CM | POA: Diagnosis not present

## 2020-04-22 DIAGNOSIS — E538 Deficiency of other specified B group vitamins: Secondary | ICD-10-CM | POA: Diagnosis not present

## 2020-04-22 MED ORDER — METHOTREXATE 2.5 MG PO TABS: 7.5000 mg | ORAL_TABLET | ORAL | 1 refills | Status: DC

## 2020-04-22 MED ORDER — METFORMIN HCL ER 750 MG PO TB24: 1500.0000 mg | ORAL_TABLET | Freq: Every day | ORAL | 1 refills | Status: DC

## 2020-04-22 NOTE — Patient Instructions (Addendum)
NOtify me for BP readings consistently > 140/90   Notify me for recurrent low blood sugars .  We will  lchange glipizide xl to glipizide short acting   You need 1000 Ius of Vit D3 daily   Return in 3 months full panel of labs prior to visit (after your cruise)

## 2020-04-22 NOTE — Progress Notes (Signed)
Subjective:  Patient ID: Emily Ryan, female    DOB: [birth year]  Age: 75 y.o. MRN: [id number]  CC: The primary encounter diagnosis was Uncontrolled type 2 diabetes mellitus with hyperglycemia ([place]). Diagnoses of B12 deficiency, Vitamin D deficiency, Hyperlipidemia associated with type 2 diabetes mellitus ([place]), and S/P shoulder replacement, right were also pertinent to this visit.  HPI Emily Ryan presents for follow up on Type 2 DM and other conditions.  This visit occurred during the SARS-CoV-2 public health emergency.  Safety protocols were in place, including screening questions prior to the visit, additional usage of staff PPE, and extensive cleaning of exam room while observing appropriate contact time as indicated for disinfecting solutions.    Since her last OV she has undergone orthopedic surgery.  She is 8 weeks  Postop from a  reverse right Shoulder arthroplasty by Dr. Roland Rack.  She is regained full mobility of her right shoulder and is pleased with the results. .    DM:   She feels generally well, is exercising several times per week and checking blood sugars once daily at variable times.  BS have been under 130 fasting and < 150 post prandially.  Denies any recent hypoglyemic events.  Taking her medications as directed. Following a carbohydrate modified diet 6 days per week. Denies numbness, burning and tingling of extremities. Appetite is good.    Hypertension: patient checks blood pressure twice weekly at home.  Readings have been and for the most part < 140/80 at rest . Patient is following a reduce salt diet most days and is taking medications as prescribed (amlodipine and losartan)   Outpatient Medications Prior to Visit  Medication Sig Dispense Refill  . amLODipine (NORVASC) 2.5 MG tablet TAKE 1 TABLET(2.5 MG) BY MOUTH DAILY (Patient taking differently: Take 2.5 mg by mouth every evening.) 90 tablet 1  . Ascorbic Acid (VITAMIN C) 1000 MG tablet Take 1,000 mg by  mouth daily.     Marland Kitchen aspirin EC 81 MG tablet Take 81 mg by mouth at bedtime. Swallow whole.    . Calcium Carbonate-Vitamin D3 600-400 MG-UNIT TABS Take 1 tablet by mouth daily.    . clobetasol cream (TEMOVATE) 2.42 % Apply 1 application topically 2 (two) times daily as needed (psoriasis).    . conjugated estrogens (PREMARIN) vaginal cream Place 1 Applicatorful vaginally daily. (Patient taking differently: Place 1 Applicatorful vaginally daily as needed (dryness/irritation).) 42.5 g 3  . Dulaglutide (TRULICITY) 1.5 PN/3.6RW SOPN Inject 1.5 mg into the skin once a week. (Patient taking differently: Inject 1.5 mg into the skin every Sunday.) 6 mL 3  . escitalopram (LEXAPRO) 5 MG tablet Take 1 tablet (5 mg total) by mouth every evening. 90 tablet 1  . ezetimibe (ZETIA) 10 MG tablet Take 1 tablet (10 mg total) by mouth daily. 90 tablet 1  . folic acid (FOLVITE) 1 MG tablet Take 1 tablet (1 mg total) by mouth See admin instructions. Takes daily except on Saturday 90 tablet 3  . glipiZIDE (GLUCOTROL XL) 10 MG 24 hr tablet Take 1 tablet (10 mg total) by mouth daily with breakfast. 90 tablet 2  . glucose blood (FREESTYLE LITE) test strip Use as instructed to test blood sugar twice daily 200 each 3  . hydrochlorothiazide (HYDRODIURIL) 12.5 MG tablet Take 1 tablet (12.5 mg total) by mouth daily. 90 tablet 3  . loratadine (CLARITIN) 10 MG tablet Take 1 tablet (10 mg total) by mouth daily. 90 tablet 1  . losartan (COZAAR) 100  MG tablet Take 1 tablet (100 mg total) by mouth daily. 90 tablet 1  . methotrexate (RHEUMATREX) 2.5 MG tablet Take 7.5 mg by mouth every Saturday.     . montelukast (SINGULAIR) 10 MG tablet Take 1 tablet (10 mg total) by mouth at bedtime. 90 tablet 3  . Multiple Vitamin (MULTIVITAMIN) capsule Take 1 capsule by mouth daily.    Marland Kitchen omeprazole (PRILOSEC) 20 MG capsule Take 1 capsule (20 mg total) by mouth daily. 90 capsule 1  . Precision Thin Lancets MISC Use as directed to check sugars twice  daily 180 each 3  . simvastatin (ZOCOR) 20 MG tablet Take 1 tablet (20 mg total) by mouth at bedtime. 90 tablet 3  . traMADol (ULTRAM) 50 MG tablet Take 1 tablet (50 mg total) by mouth every 6 (six) hours as needed (Breakthrough pain). 30 tablet 0  . vitamin E 400 UNIT capsule Take 400 Units by mouth daily.    . metFORMIN (GLUCOPHAGE XR) 750 MG 24 hr tablet Take 2 tablets (1,500 mg total) by mouth daily with breakfast. 180 tablet 1  . Continuous Blood Gluc Receiver (DEXCOM G6 RECEIVER) DEVI Use to check pre and post prandial sugars 6 time daily   .  For uncontrolled diabetes (Patient not taking: Reported on 04/22/2020) 1 each 11  . Continuous Blood Gluc Sensor (DEXCOM G6 SENSOR) MISC Use to check pre and post prandial sugars 6 time daily  E11.69 (Patient not taking: Reported on 04/22/2020) 1 each 11  . Continuous Blood Gluc Transmit (DEXCOM G6 TRANSMITTER) MISC Use to check pre and post prandial sugars 6 time daily   . E11.69 1 each 11  . HYDROcodone-acetaminophen (NORCO) 5-325 MG tablet Take 1-2 tablets by mouth every 6 (six) hours as needed for moderate pain or severe pain. MAXIMUM TOTAL ACETAMINOPHEN DOSE IS 4000 MG PER DAY (Patient not taking: Reported on 04/22/2020) 40 tablet 0   No facility-administered medications prior to visit.    Review of Systems;  Patient denies headache, fevers, malaise, unintentional weight loss, skin rash, eye pain, sinus congestion and sinus pain, sore throat, dysphagia,  hemoptysis , cough, dyspnea, wheezing, chest pain, palpitations, orthopnea, edema, abdominal pain, nausea, melena, diarrhea, constipation, flank pain, dysuria, hematuria, urinary  Frequency, nocturia, numbness, tingling, seizures,  Focal weakness, Loss of consciousness,  Tremor, insomnia, depression, anxiety, and suicidal ideation.      Objective:  BP 136/68 (BP Location: Left Arm, Patient Position: Sitting, Cuff Size: Normal)   Pulse 81   Temp 98.6 F (37 C) (Oral)   Resp 16   Ht 4\' 11"  (1.499  m)   Wt 135 lb 3.2 oz (61.3 kg)   SpO2 97%   BMI 27.31 kg/m   BP Readings from Last 3 Encounters:  04/22/20 136/68  02/26/20 129/69  02/19/20 (!) 141/67    Wt Readings from Last 3 Encounters:  04/22/20 135 lb 3.2 oz (61.3 kg)  02/26/20 137 lb 12.6 oz (62.5 kg)  02/19/20 137 lb 12.8 oz (62.5 kg)    General appearance: alert, cooperative and appears stated age Ears: normal TM's and external ear canals both ears Throat: lips, mucosa, and tongue normal; teeth and gums normal Neck: no adenopathy, no carotid bruit, supple, symmetrical, trachea midline and thyroid not enlarged, symmetric, no tenderness/mass/nodules Back: symmetric, no curvature. ROM normal. No CVA tenderness. Lungs: clear to auscultation bilaterally Heart: regular rate and rhythm, S1, S2 normal, no murmur, click, rub or gallop Abdomen: soft, non-tender; bowel sounds normal; no masses,  no  organomegaly Pulses: 2+ and symmetric Skin: Skin color, texture, turgor normal. No rashes or lesions Lymph nodes: Cervical, supraclavicular, and axillary nodes normal.  Lab Results  Component Value Date   HGBA1C 7.5 (H) 04/16/2020   HGBA1C 7.8 (H) 01/14/2020   HGBA1C 8.0 (H) 10/10/2019    Lab Results  Component Value Date   CREATININE 0.63 04/16/2020   CREATININE 0.57 02/19/2020   CREATININE 0.64 01/14/2020    Lab Results  Component Value Date   WBC 5.7 02/19/2020   HGB 14.5 02/19/2020   HCT 42.2 02/19/2020   PLT 239 02/19/2020   GLUCOSE 175 (H) 04/16/2020   CHOL 154 01/14/2020   TRIG 138.0 01/14/2020   HDL 56.20 01/14/2020   LDLDIRECT 72.0 04/12/2018   LDLCALC 71 01/14/2020   ALT 32 04/16/2020   AST 22 04/16/2020   NA 138 04/16/2020   K 4.0 04/16/2020   CL 100 04/16/2020   CREATININE 0.63 04/16/2020   BUN 13 04/16/2020   CO2 30 04/16/2020   TSH 3.11 01/14/2020   HGBA1C 7.5 (H) 04/16/2020   MICROALBUR 1.5 10/10/2019    No results found.  Assessment & Plan:   Problem List Items Addressed This Visit       Unprioritized   S/P shoulder replacement, right    Done in January by Dr Roland Rack with excellent restoration of mobility      Hyperlipidemia associated with type 2 diabetes mellitus ([place])    LDL is < 100 and triglycerides are at goal on simvastatin/zetia . She has no side effects and ALT has normalized  With improved glycemic control  (hepatic steatosis hx). No changes today. Continue aspirin low dose.  Lab Results  Component Value Date   CHOL 154 01/14/2020   HDL 56.20 01/14/2020   LDLCALC 71 01/14/2020   LDLDIRECT 72.0 04/12/2018   TRIG 138.0 01/14/2020   CHOLHDL 3 01/14/2020   Lab Results  Component Value Date   ALT 32 04/16/2020   AST 22 04/16/2020   ALKPHOS 91 04/16/2020   BILITOT 0.7 04/16/2020          Relevant Medications   metFORMIN (GLUCOPHAGE XR) 750 MG 24 hr tablet   DM type 2 with diabetic mixed hyperlipidemia ([facility]) - Primary    Improved control despite recent surgery induced lifestyle changes.  continue current  metformin dose and  glipzide  xr dosing  5mg  , and trulicity  . Advised to notify is hypoglycemia occurs with increased activity so  glipizide can  be changed to short acting .  Continue losartan and simvastatin   Lab Results  Component Value Date   HGBA1C 7.5 (H) 04/16/2020   Lab Results  Component Value Date   MICROALBUR 1.5 10/10/2019   MICROALBUR <0.7 08/08/2018           Relevant Medications   metFORMIN (GLUCOPHAGE XR) 750 MG 24 hr tablet    Other Visit Diagnoses    B12 deficiency       Relevant Orders   CBC with Differential/Platelet   Vitamin B12   Vitamin D deficiency       Relevant Orders   VITAMIN D 25 Hydroxy (Vit-D Deficiency, Fractures)      I have discontinued Emily Ryan's Dexcom G6 Receiver, Dexcom G6 Sensor, Dexcom G6 Transmitter, and HYDROcodone-acetaminophen. I am also having her start on methotrexate. Additionally, I am having her maintain her vitamin E, multivitamin, methotrexate, Precision Thin Lancets,  vitamin C, clobetasol cream, Premarin, simvastatin, montelukast, hydrochlorothiazide, amLODipine, FREESTYLE LITE, glipiZIDE,  Trulicity, aspirin EC, Calcium Carbonate-Vitamin D3, traMADol, omeprazole, folic acid, ezetimibe, loratadine, escitalopram, losartan, and metFORMIN.  Meds ordered this encounter  Medications  . metFORMIN (GLUCOPHAGE XR) 750 MG 24 hr tablet    Sig: Take 2 tablets (1,500 mg total) by mouth daily with breakfast.    Dispense:  180 tablet    Refill:  1  . methotrexate (RHEUMATREX) 2.5 MG tablet    Sig: Take 3 tablets (7.5 mg total) by mouth once a week. Caution:Chemotherapy. Protect from light.    Dispense:  36 tablet    Refill:  1    Medications Discontinued During This Encounter  Medication Reason  . Continuous Blood Gluc Receiver ([place]) [place]   . Continuous Blood Gluc Sensor (DEXCOM G6 SENSOR) MISC   . Continuous Blood Gluc Transmit (DEXCOM G6 TRANSMITTER) MISC   . HYDROcodone-acetaminophen (NORCO) 5-325 MG tablet   . metFORMIN (GLUCOPHAGE XR) 750 MG 24 hr tablet Reorder    Follow-up: Return in about 3 months (around 07/22/2020) for follow up diabetes.   Crecencio Mc, MD

## 2020-04-24 DIAGNOSIS — Z96611 Presence of right artificial shoulder joint: Secondary | ICD-10-CM | POA: Insufficient documentation

## 2020-04-24 NOTE — Assessment & Plan Note (Signed)
Done in January by Dr Roland Rack with excellent restoration of mobility

## 2020-04-24 NOTE — Assessment & Plan Note (Signed)
LDL is < 100 and triglycerides are at goal on simvastatin/zetia . She has no side effects and ALT has normalized  With improved glycemic control  (hepatic steatosis hx). No changes today. Continue aspirin low dose.  Lab Results  Component Value Date   CHOL 154 01/14/2020   HDL 56.20 01/14/2020   LDLCALC 71 01/14/2020   LDLDIRECT 72.0 04/12/2018   TRIG 138.0 01/14/2020   CHOLHDL 3 01/14/2020   Lab Results  Component Value Date   ALT 32 04/16/2020   AST 22 04/16/2020   ALKPHOS 91 04/16/2020   BILITOT 0.7 04/16/2020

## 2020-04-24 NOTE — Assessment & Plan Note (Addendum)
Improved control despite recent surgery induced lifestyle changes.  continue current  metformin dose and  glipzide  xr dosing  5mg  , and trulicity  . Advised to notify is hypoglycemia occurs with increased activity so  glipizide can  be changed to short acting .  Continue losartan and simvastatin   Lab Results  Component Value Date   HGBA1C 7.5 (H) 04/16/2020   Lab Results  Component Value Date   MICROALBUR 1.5 10/10/2019   MICROALBUR <0.7 08/08/2018

## 2020-04-27 DIAGNOSIS — M25511 Pain in right shoulder: Secondary | ICD-10-CM | POA: Diagnosis not present

## 2020-04-27 DIAGNOSIS — Z96611 Presence of right artificial shoulder joint: Secondary | ICD-10-CM | POA: Diagnosis not present

## 2020-04-29 DIAGNOSIS — Z96611 Presence of right artificial shoulder joint: Secondary | ICD-10-CM | POA: Diagnosis not present

## 2020-04-29 DIAGNOSIS — M25511 Pain in right shoulder: Secondary | ICD-10-CM | POA: Diagnosis not present

## 2020-05-04 DIAGNOSIS — Z96611 Presence of right artificial shoulder joint: Secondary | ICD-10-CM | POA: Diagnosis not present

## 2020-05-04 DIAGNOSIS — M25511 Pain in right shoulder: Secondary | ICD-10-CM | POA: Diagnosis not present

## 2020-05-06 DIAGNOSIS — M25511 Pain in right shoulder: Secondary | ICD-10-CM | POA: Diagnosis not present

## 2020-05-06 DIAGNOSIS — Z96611 Presence of right artificial shoulder joint: Secondary | ICD-10-CM | POA: Diagnosis not present

## 2020-05-11 DIAGNOSIS — Z96611 Presence of right artificial shoulder joint: Secondary | ICD-10-CM | POA: Diagnosis not present

## 2020-05-11 DIAGNOSIS — M25511 Pain in right shoulder: Secondary | ICD-10-CM | POA: Diagnosis not present

## 2020-05-13 DIAGNOSIS — Z96611 Presence of right artificial shoulder joint: Secondary | ICD-10-CM | POA: Diagnosis not present

## 2020-05-13 DIAGNOSIS — M25511 Pain in right shoulder: Secondary | ICD-10-CM | POA: Diagnosis not present

## 2020-05-18 DIAGNOSIS — M25511 Pain in right shoulder: Secondary | ICD-10-CM | POA: Diagnosis not present

## 2020-05-18 DIAGNOSIS — Z96611 Presence of right artificial shoulder joint: Secondary | ICD-10-CM | POA: Diagnosis not present

## 2020-05-21 DIAGNOSIS — Z96611 Presence of right artificial shoulder joint: Secondary | ICD-10-CM | POA: Diagnosis not present

## 2020-05-21 DIAGNOSIS — M25511 Pain in right shoulder: Secondary | ICD-10-CM | POA: Diagnosis not present

## 2020-05-25 DIAGNOSIS — M25511 Pain in right shoulder: Secondary | ICD-10-CM | POA: Diagnosis not present

## 2020-05-25 DIAGNOSIS — Z96611 Presence of right artificial shoulder joint: Secondary | ICD-10-CM | POA: Diagnosis not present

## 2020-05-26 ENCOUNTER — Telehealth: Payer: Self-pay | Admitting: Internal Medicine

## 2020-05-26 NOTE — Telephone Encounter (Signed)
Scheduled pt for 5/25 @ 8am  Pt stated that she is having Vein issues in the back of her left knee on going for the last several weeks

## 2020-05-27 ENCOUNTER — Other Ambulatory Visit: Payer: Self-pay

## 2020-05-27 ENCOUNTER — Encounter: Payer: Self-pay | Admitting: Adult Health

## 2020-05-27 ENCOUNTER — Ambulatory Visit
Admission: RE | Admit: 2020-05-27 | Discharge: 2020-05-27 | Disposition: A | Discharge status: Home / Self Care | Payer: Medicare Other | Source: Ambulatory Visit | Attending: Adult Health | Admitting: Adult Health

## 2020-05-27 ENCOUNTER — Ambulatory Visit (INDEPENDENT_AMBULATORY_CARE_PROVIDER_SITE_OTHER): Payer: Medicare Other | Admitting: Adult Health

## 2020-05-27 VITALS — BP 132/60 | HR 78 | Temp 98.1°F | Ht 59.02 in | Wt 135.6 lb

## 2020-05-27 DIAGNOSIS — Z9889 Other specified postprocedural states: Secondary | ICD-10-CM

## 2020-05-27 DIAGNOSIS — M7122 Synovial cyst of popliteal space [Baker], left knee: Secondary | ICD-10-CM

## 2020-05-27 DIAGNOSIS — M79605 Pain in left leg: Secondary | ICD-10-CM | POA: Insufficient documentation

## 2020-05-27 LAB — CBC WITH DIFFERENTIAL/PLATELET
Basophils Absolute: 0 10*3/uL (ref 0.0–0.1)
Basophils Relative: 0.7 % (ref 0.0–3.0)
Eosinophils Absolute: 0.1 10*3/uL (ref 0.0–0.7)
Eosinophils Relative: 1.6 % (ref 0.0–5.0)
HCT: 42.3 % (ref 36.0–46.0)
Hemoglobin: 14.5 g/dL (ref 12.0–15.0)
Lymphocytes Relative: 23.1 % (ref 12.0–46.0)
Lymphs Abs: 1.4 10*3/uL (ref 0.7–4.0)
MCHC: 34.4 g/dL (ref 30.0–36.0)
MCV: 98.2 fl (ref 78.0–100.0)
Monocytes Absolute: 0.6 10*3/uL (ref 0.1–1.0)
Monocytes Relative: 9.3 % (ref 3.0–12.0)
Neutro Abs: 4 10*3/uL (ref 1.4–7.7)
Neutrophils Relative %: 65.3 % (ref 43.0–77.0)
Platelets: 263 10*3/uL (ref 150.0–400.0)
RBC: 4.3 Mil/uL (ref 3.87–5.11)
RDW: 13.4 % (ref 11.5–15.5)
WBC: 6.2 10*3/uL (ref 4.0–10.5)

## 2020-05-27 LAB — COMPREHENSIVE METABOLIC PANEL
ALT: 32 U/L (ref 0–35)
AST: 22 U/L (ref 0–37)
Albumin: 4.2 g/dL (ref 3.5–5.2)
Alkaline Phosphatase: 94 U/L (ref 39–117)
BUN: 12 mg/dL (ref 6–23)
CO2: 27 mEq/L (ref 19–32)
Calcium: 9.2 mg/dL (ref 8.4–10.5)
Chloride: 99 mEq/L (ref 96–112)
Creatinine, Ser: 0.62 mg/dL (ref 0.40–1.20)
GFR: 87.02 mL/min (ref >60.00)
Glucose, Bld: 279 mg/dL — ABNORMAL HIGH (ref 70–99)
Potassium: 4.1 mEq/L (ref 3.5–5.1)
Sodium: 136 mEq/L (ref 135–145)
Total Bilirubin: 0.6 mg/dL (ref 0.2–1.2)
Total Protein: 6.7 g/dL (ref 6.0–8.3)

## 2020-05-27 LAB — D-DIMER, QUANTITATIVE: D-Dimer, Quant: 0.6 mcg/mL FEU — ABNORMAL HIGH (ref <0.50)

## 2020-05-27 NOTE — Addendum Note (Signed)
Addended by: Doreen Beam on: 05/27/2020 04:53 PM   Modules accepted: Orders

## 2020-05-27 NOTE — Progress Notes (Addendum)
Acute Office Visit  Subjective:    Patient ID: Emily Ryan, female    DOB: [birth year], 75 y.o.   MRN: [id number]  Chief Complaint  Patient presents with  . Leg Pain    Patient stated it started a couple weeks ago and has gotten worse. Pain is in the back of patients left leg    HPI Patient is in today for left lower leg ache/ pain posterior knee and anterior thigh.  Denies any history of blood clots. Denies any warm areas or erythema. Denies any falls or trauma.  She took two asa yesterday. She is on 81 mg asa daily.  Has had"  vein stripping in right leg not in left  Years ago".   Recent surgery-  02/26/20 right shoulder replacement. Last therapy for shoulder this week.  Denies any dyspnea.  " feels well"   Patient  denies any fever, chills, rash, chest pain, shortness of breath, nausea, vomiting, or diarrhea.   Denies dizziness, lightheadedness, pre syncopal or syncopal episodes.    Past Medical History:  Diagnosis Date  . Complicated pregnancy    1st pregnancy complicated by post operative hemorrhage and 2ng complicated by epidural  . Diabetes mellitus   . Diverticulosis of colon (without mention of hemorrhage)   . Fracture of malleolus of right ankle, closed, with routine healing, subsequent encounter 12/09/2017  . GERD (gastroesophageal reflux disease)   . Guttate psoriasis   . Hyperlipidemia   . Hypertension   . Menopausal disorder   . Rectal stricture   . Skin cancer     Past Surgical History:  Procedure Laterality Date  . ABDOMINAL HYSTERECTOMY    . APPENDECTOMY  Dec 2012  . BLADDER REPAIR  1991   lift   . COLON RESECTION SIGMOID N/A 02/19/2018   Procedure: LAPAROSCOPIC SIGMOID COLECTOMY- POSSIBLE OPEN;  Surgeon: Olean Ree, MD;  Location: [facility] ORS;  Service: General;  Laterality: N/A;  . COLONOSCOPY WITH PROPOFOL N/A 07/17/2017   Procedure: COLONOSCOPY WITH PROPOFOL;  Surgeon: Lin Landsman, MD;  Location: [facility] ENDOSCOPY;  Service:  Gastroenterology;  Laterality: N/A;  . COLONOSCOPY WITH PROPOFOL N/A 11/02/2017   Procedure: COLONOSCOPY WITH PROPOFOL;  Surgeon: Lin Landsman, MD;  Location: [facility]COPY;  Service: Gastroenterology;  Laterality: N/A;  . CYSTOCELE REPAIR    . CYSTOSCOPY W/ URETERAL STENT PLACEMENT Bilateral 02/19/2018   Procedure: CYSTOSCOPY WITH STENT PLACEMENT GOING 1ST;  Surgeon: Billey Co, MD;  Location: [facility] ORS;  Service: Urology;  Laterality: Bilateral;  . FLEXIBLE SIGMOIDOSCOPY N/A 07/31/2017   Procedure: FLEXIBLE SIGMOIDOSCOPY;  Surgeon: Lin Landsman, MD;  Location: [facility] ENDOSCOPY;  Service: Gastroenterology;  Laterality: N/A;  . HERNIA REPAIR    . MOHS SURGERY N/A 05/2015   Face  . PARTIAL COLECTOMY  Nov 2012   left, secondary to diverticular perf  . RECTOCELE REPAIR    . REVERSE SHOULDER ARTHROPLASTY Right 02/26/2020   Procedure: REVERSE SHOULDER ARTHROPLASTY;  Surgeon: Corky Mull, MD;  Location: [facility] ORS;  Service: Orthopedics;  Laterality: Right;  . SEPTOPLASTY  1969  . TEE WITHOUT CARDIOVERSION N/A 11/24/2015   Procedure: TRANSESOPHAGEAL ECHOCARDIOGRAM (TEE);  Surgeon: Minna Merritts, MD;  Location: [facility] ORS;  Service: Cardiovascular;  Laterality: N/A;  . TONSILLECTOMY  1953  . VARICOSE VEIN SURGERY  1974   right leg     Family History  Problem Relation Age of Onset  . Heart Problems Mother   . Macular degeneration Mother   . Cancer Father   .  Cancer Maternal Grandmother        colon ca  . Diabetes Sister   . Dementia Sister   . Diabetes Brother   . Stroke Brother   . Diabetes Brother   . Breast cancer Maternal Aunt   . Breast cancer Paternal Aunt   . Lung cancer Cousin   . Lung disease Neg Hx   . Rheumatologic disease Neg Hx     Social History   Socioeconomic History  . Marital status: Married    Spouse name: Not on file  . Number of children: Not on file  . Years of education: Not on file  . Highest education level: Not on file  Occupational  History  . Not on file  Tobacco Use  . Smoking status: Never Smoker  . Smokeless tobacco: Never Used  . Tobacco comment: father growing up  Vaping Use  . Vaping Use: Never used  Substance and Sexual Activity  . Alcohol use: Yes    Comment: Occasional  . Drug use: No  . Sexual activity: Yes    Birth control/protection: Post-menopausal  Other Topics Concern  . Not on file  Social History Narrative   [facility] (11/14/16):   She is originally from [place]. Previously has worked as a Network engineer, in [employer], & in [employer]. Her husband was in [employer] and they traveled extensively. She lived in [place], [place], [place], [place], [place], [place]. She has 2 cats currently. Remote parakeet exposure as a child. No mold exposure. No recent hot tub exposure.    Social Determinants of Health   Financial Resource Strain: Low Risk   . Difficulty of Paying Living Expenses: Not hard at all  Food Insecurity: No Food Insecurity  . Worried About Charity fundraiser in the Last Year: Never true  . Ran Out of Food in the Last Year: Never true  Transportation Needs: No Transportation Needs  . Lack of Transportation (Medical): No  . Lack of Transportation (Non-Medical): No  Physical Activity: Not on file  Stress: No Stress Concern Present  . Feeling of Stress : Not at all  Social Connections: Socially Integrated  . Frequency of Communication with Friends and Family: More than three times a week  . Frequency of Social Gatherings with Friends and Family: More than three times a week  . Attends Religious Services: More than 4 times per year  . Active Member of Clubs or Organizations: Yes  . Attends Archivist Meetings: Not on file  . Marital Status: Married  Human resources officer Violence: Not At Risk  . Fear of Current or Ex-Partner: No  . Emotionally Abused: No  . Physically Abused: No  . Sexually Abused: No    Outpatient Medications Prior to Visit  Medication Sig Dispense Refill  . amLODipine  (NORVASC) 2.5 MG tablet TAKE 1 TABLET(2.5 MG) BY MOUTH DAILY (Patient taking differently: Take 2.5 mg by mouth every evening.) 90 tablet 1  . Ascorbic Acid (VITAMIN C) 1000 MG tablet Take 1,000 mg by mouth daily.     Marland Kitchen aspirin EC 81 MG tablet Take 81 mg by mouth at bedtime. Swallow whole.    . Calcium Carbonate-Vitamin D3 600-400 MG-UNIT TABS Take 1 tablet by mouth daily.    . clobetasol cream (TEMOVATE) 2.26 % Apply 1 application topically 2 (two) times daily as needed (psoriasis).    . conjugated estrogens (PREMARIN) vaginal cream Place 1 Applicatorful vaginally daily. (Patient taking differently: Place 1 Applicatorful vaginally daily as  needed (dryness/irritation).) 42.5 g 3  . Dulaglutide (TRULICITY) 1.5 HF/4.1SE SOPN Inject 1.5 mg into the skin once a week. (Patient taking differently: Inject 1.5 mg into the skin every Sunday.) 6 mL 3  . escitalopram (LEXAPRO) 5 MG tablet Take 1 tablet (5 mg total) by mouth every evening. 90 tablet 1  . ezetimibe (ZETIA) 10 MG tablet Take 1 tablet (10 mg total) by mouth daily. 90 tablet 1  . folic acid (FOLVITE) 1 MG tablet Take 1 tablet (1 mg total) by mouth See admin instructions. Takes daily except on Saturday 90 tablet 3  . glipiZIDE (GLUCOTROL XL) 10 MG 24 hr tablet Take 1 tablet (10 mg total) by mouth daily with breakfast. 90 tablet 2  . glucose blood (FREESTYLE LITE) test strip Use as instructed to test blood sugar twice daily 200 each 3  . hydrochlorothiazide (HYDRODIURIL) 12.5 MG tablet Take 1 tablet (12.5 mg total) by mouth daily. 90 tablet 3  . loratadine (CLARITIN) 10 MG tablet Take 1 tablet (10 mg total) by mouth daily. 90 tablet 1  . losartan (COZAAR) 100 MG tablet Take 1 tablet (100 mg total) by mouth daily. 90 tablet 1  . metFORMIN (GLUCOPHAGE XR) 750 MG 24 hr tablet Take 2 tablets (1,500 mg total) by mouth daily with breakfast. 180 tablet 1  . methotrexate (RHEUMATREX) 2.5 MG tablet Take 3 tablets (7.5 mg total) by mouth once a week.  Caution:Chemotherapy. Protect from light. 36 tablet 1  . montelukast (SINGULAIR) 10 MG tablet Take 1 tablet (10 mg total) by mouth at bedtime. 90 tablet 3  . Multiple Vitamin (MULTIVITAMIN) capsule Take 1 capsule by mouth daily.    Marland Kitchen omeprazole (PRILOSEC) 20 MG capsule Take 1 capsule (20 mg total) by mouth daily. 90 capsule 1  . Precision Thin Lancets MISC Use as directed to check sugars twice daily 180 each 3  . simvastatin (ZOCOR) 20 MG tablet Take 1 tablet (20 mg total) by mouth at bedtime. 90 tablet 3  . traMADol (ULTRAM) 50 MG tablet Take 1 tablet (50 mg total) by mouth every 6 (six) hours as needed (Breakthrough pain). 30 tablet 0  . vitamin E 400 UNIT capsule Take 400 Units by mouth daily.    . methotrexate (RHEUMATREX) 2.5 MG tablet Take 7.5 mg by mouth every Saturday.  (Patient not taking: Reported on 05/27/2020)     No facility-administered medications prior to visit.    Allergies  Allergen Reactions  . Prednisone Hives    Review of Systems  Constitutional: Negative.   HENT: Negative.   Respiratory: Negative.   Cardiovascular: Negative.   Gastrointestinal: Negative.   Genitourinary: Negative.   Musculoskeletal: Positive for myalgias. Negative for arthralgias, back pain, gait problem, joint swelling, neck pain and neck stiffness.  Skin: Negative.   Neurological: Negative.   Psychiatric/Behavioral: Negative.        Objective:    Physical Exam Vitals reviewed.  Constitutional:      General: She is not in acute distress.    Appearance: She is well-developed. She is not diaphoretic.     Interventions: She is not intubated. HENT:     Head: Normocephalic and atraumatic.     Right Ear: External ear normal.     Left Ear: External ear normal.     Nose: Nose normal.     Mouth/Throat:     Pharynx: No oropharyngeal exudate.  Eyes:     General: Lids are normal. No scleral icterus.  Right eye: No discharge.        Left eye: No discharge.     Conjunctiva/sclera:  Conjunctivae normal.     Right eye: Right conjunctiva is not injected. No exudate or hemorrhage.    Left eye: Left conjunctiva is not injected. No exudate or hemorrhage.    Pupils: Pupils are equal, round, and reactive to light.  Neck:     Thyroid: No thyroid mass or thyromegaly.     Vascular: Normal carotid pulses. No carotid bruit, hepatojugular reflux or JVD.     Trachea: Trachea and phonation normal. No tracheal tenderness or tracheal deviation.     Meningeal: Brudzinski's sign and Kernig's sign absent.  Cardiovascular:     Rate and Rhythm: Normal rate and regular rhythm.     Pulses: Normal pulses.          Radial pulses are 2+ on the right side and 2+ on the left side.       Dorsalis pedis pulses are 2+ on the right side and 2+ on the left side.       Posterior tibial pulses are 2+ on the right side and 2+ on the left side.     Heart sounds: Normal heart sounds, S1 normal and S2 normal. Heart sounds not distant. No murmur heard. No friction rub. No gallop.   Pulmonary:     Effort: Pulmonary effort is normal. No tachypnea, bradypnea, accessory muscle usage or respiratory distress. She is not intubated.     Breath sounds: Normal breath sounds. No stridor. No wheezing, rhonchi or rales.  Chest:     Chest wall: No tenderness.  Breasts:     Right: No supraclavicular adenopathy.     Left: No supraclavicular adenopathy.    Abdominal:     General: Bowel sounds are normal. There is no distension or abdominal bruit.     Palpations: Abdomen is soft. There is no shifting dullness, fluid wave, hepatomegaly, splenomegaly, mass or pulsatile mass.     Tenderness: There is no abdominal tenderness. There is no guarding or rebound.     Hernia: No hernia is present.  Musculoskeletal:        General: No tenderness or deformity. Normal range of motion.     Cervical back: Full passive range of motion without pain, normal range of motion and neck supple. No edema, erythema or rigidity. No spinous  process tenderness or muscular tenderness. Normal range of motion.       Legs:     Comments: Pain in this area, no appreciable edema, palpable area popliteal approximately 3cm x 3 cm on exam, no  erythema or warmth. No rash.  Bevelyn Buckles' negative.  2+ DP pulse, popliteal pulse.  Lymphadenopathy:     Head:     Right side of head: No submental, submandibular, tonsillar, preauricular, posterior auricular or occipital adenopathy.     Left side of head: No submental, submandibular, tonsillar, preauricular, posterior auricular or occipital adenopathy.     Cervical: No cervical adenopathy.     Right cervical: No superficial, deep or posterior cervical adenopathy.    Left cervical: No superficial, deep or posterior cervical adenopathy.     Upper Body:     Right upper body: No supraclavicular or pectoral adenopathy.     Left upper body: No supraclavicular or pectoral adenopathy.  Skin:    General: Skin is warm and dry.     Coloration: Skin is not jaundiced or pale.     Findings: No abrasion, bruising,  burn, ecchymosis, erythema, lesion, petechiae or rash.     Nails: There is no clubbing.       Neurological:     Mental Status: She is alert and oriented to person, place, and time.     GCS: GCS eye subscore is 4. GCS verbal subscore is 5. GCS motor subscore is 6.     Cranial Nerves: No cranial nerve deficit.     Sensory: No sensory deficit.     Motor: No tremor, atrophy, abnormal muscle tone or seizure activity.     Coordination: Coordination normal.     Gait: Gait normal.     Deep Tendon Reflexes: Reflexes are normal and symmetric. Reflexes normal. Babinski sign absent on the right side. Babinski sign absent on the left side.     Reflex Scores:      Tricep reflexes are 2+ on the right side and 2+ on the left side.      Bicep reflexes are 2+ on the right side and 2+ on the left side.      Brachioradialis reflexes are 2+ on the right side and 2+ on the left side.      Patellar reflexes are 2+ on  the right side and 2+ on the left side.      Achilles reflexes are 2+ on the right side and 2+ on the left side. Psychiatric:        Speech: Speech normal.        Behavior: Behavior normal.        Thought Content: Thought content normal.        Judgment: Judgment normal.     BP 132/60 (BP Location: Left Arm, Patient Position: Sitting, Cuff Size: Normal)   Pulse 78   Temp 98.1 F (36.7 C)   Ht 4' 11.02" (1.499 m)   Wt 135 lb 9.6 oz (61.5 kg)   SpO2 97%   BMI 27.37 kg/m  Wt Readings from Last 3 Encounters:  05/27/20 135 lb 9.6 oz (61.5 kg)  04/22/20 135 lb 3.2 oz (61.3 kg)  02/26/20 137 lb 12.6 oz (62.5 kg)    Health Maintenance Due  Topic Date Due  . OPHTHALMOLOGY EXAM  03/17/2020    There are no preventive care reminders to display for this patient.   Lab Results  Component Value Date   TSH 3.11 01/14/2020   Lab Results  Component Value Date   WBC 5.7 02/19/2020   HGB 14.5 02/19/2020   HCT 42.2 02/19/2020   MCV 98.6 02/19/2020   PLT 239 02/19/2020   Lab Results  Component Value Date   NA 138 04/16/2020   K 4.0 04/16/2020   CO2 30 04/16/2020   GLUCOSE 175 (H) 04/16/2020   BUN 13 04/16/2020   CREATININE 0.63 04/16/2020   BILITOT 0.7 04/16/2020   ALKPHOS 91 04/16/2020   AST 22 04/16/2020   ALT 32 04/16/2020   PROT 6.8 04/16/2020   ALBUMIN 3.9 04/16/2020   CALCIUM 9.1 04/16/2020   ANIONGAP 12 02/19/2020   GFR 86.75 04/16/2020   Lab Results  Component Value Date   CHOL 154 01/14/2020   Lab Results  Component Value Date   HDL 56.20 01/14/2020   Lab Results  Component Value Date   LDLCALC 71 01/14/2020   Lab Results  Component Value Date   TRIG 138.0 01/14/2020   Lab Results  Component Value Date   CHOLHDL 3 01/14/2020   Lab Results  Component Value Date   HGBA1C 7.5 (H) 04/16/2020  Assessment & Plan:   Problem List Items Addressed This Visit      Other   Leg pain, posterior, left - Primary   Relevant Orders   US Venous Img  Lower Unilateral Left (DVT)   Comprehensive metabolic panel   CBC with Differential/Platelet   D-dimer, quantitative (not at [facility])   Recent major surgery     Orders Placed This Encounter  Procedures  . US Venous Img Lower Unilateral Left (DVT)  . Comprehensive metabolic panel  . CBC with Differential/Platelet  . D-dimer, quantitative (not at [facility])   Will get ultrasound to rule out DVT, question popliteal cyst given exam however with recent surgery risk and symptoms need to rule out DVT. Will get CMP.   Red Flags discussed. The patient was given clear instructions to go to ER or return to medical center if any red flags develop, symptoms do not improve, worsen or new problems develop. They verbalized understanding.   Return in about 2 weeks (around 06/10/2020), or if symptoms worsen or fail to improve, for at any time for any worsening symptoms, Go to Emergency room/ urgent care if worse.   Marcille Buffy, FNP

## 2020-05-27 NOTE — Patient Instructions (Addendum)
Baker Cyst  A Baker cyst, also called a popliteal cyst, is a growth that forms at the back of the knee. The cyst forms when the fluid-filled sac (bursa) that cushions the knee joint becomes enlarged. What are the causes? In most cases, a Baker cyst results from another knee problem that causes swelling inside the knee. This makes the fluid inside the knee joint (synovial fluid) flow into the bursa behind the knee, causing the bursa to enlarge. What increases the risk? You may be more likely to develop a Baker cyst if you already have a knee problem, such as:  A tear in cartilage that cushions the knee joint (meniscal tear).  A tear in the tissues that connect the bones of the knee joint (ligament tear).  Knee swelling from osteoarthritis, rheumatoid arthritis, or gout. What are the signs or symptoms? The main symptom of this condition is a lump behind the knee. This may be the only symptom of the condition. The lump may be painful, especially when the knee is straightened. If the lump is painful, the pain may come and go. The knee may also be stiff. Symptoms may quickly get more severe if the cyst breaks open (ruptures). If the cyst ruptures, you may feel the following in your knee and calf:  Sudden or worsening pain.  Swelling.  Bruising.  Redness in the calf. A Baker cyst does not always cause symptoms. How is this diagnosed? This condition may be diagnosed based on your symptoms and medical history. Your health care provider will also do a physical exam. This may include:  Feeling the cyst to check whether it is tender.  Checking your knee for signs of another knee condition that causes swelling. You may have imaging tests, such as:  X-rays.  MRI.  Ultrasound. How is this treated? A Baker cyst that is not painful may go away without treatment. If the cyst gets large or painful, it will likely get better if the underlying knee problem is treated. If needed, treatment for a  Baker cyst may include:  Resting.  Keeping weight off of the knee. This means not leaning on the knee to support your body weight.  Taking NSAIDs, such as ibuprofen, to reduce pain and swelling.  Having a procedure to drain the fluid from the cyst with a needle (aspiration). You may also get an injection of a medicine that reduces swelling (steroid).  Having surgery. This may be needed if other treatments do not work. This usually involves correcting knee damage and removing the cyst. Follow these instructions at home: Activity  Rest as told by your health care provider.  Avoid activities that make pain or swelling worse.  Return to your normal activities as told by your health care provider. Ask your health care provider what activities are safe for you.  Do not use the injured limb to support your body weight until your health care provider says that you can. Use crutches as told by your health care provider. General instructions  Take over-the-counter and prescription medicines only as told by your health care provider.  Keep all follow-up visits as told by your health care provider. This is important.   Contact a health care provider if:  You have knee pain, stiffness, or swelling that does not get better. Get help right away if:  You have sudden or worsening pain and swelling in your calf area. Summary  A Baker cyst, also called a popliteal cyst, is a growth that forms at   the back of the knee.  In most cases, a Baker cyst results from another knee problem that causes swelling inside the knee.  A Baker cyst that is not painful may go away without treatment.  If needed, treatment for a Baker cyst may include resting, keeping weight off of the knee, medicines, or draining fluid from the cyst.  Surgery may be needed if other treatments are not effective. This information is not intended to replace advice given to you by your health care provider. Make sure you discuss any  questions you have with your health care provider. Document Revised: 05/04/2018 Document Reviewed: 05/04/2018 Elsevier Patient Education  2021 [place]. Leg Cramps Leg cramps occur when one or more muscles tighten and a person has no control over it (involuntary muscle contraction). Muscle cramps are most common in the calf muscles of the leg. They can occur during exercise or at rest. Leg cramps are painful, and they may last for a few seconds to a few minutes. Cramps may return several times before they finally stop. Usually, leg cramps are not caused by a serious medical problem. In many cases, the cause is not known. Some common causes include:  Excessive physical effort (overexertion), such as during intense exercise.  Doing the same motion over and over.  Staying in a certain position for a long period of time.  Improper preparation, form, or technique while doing a sport or an activity.  Dehydration.  Injury.  Side effects of certain medicines.  Abnormally low levels of minerals in your blood (electrolytes), especially potassium and calcium. This could result from: ? Pregnancy. ? Taking diuretic medicines. Follow these instructions at home: Eating and drinking  Drink enough fluid to keep your urine pale yellow. Staying hydrated may help prevent cramps.  Eat a healthy diet that includes plenty of nutrients to help your muscles function. A healthy diet includes fruits and vegetables, lean protein, whole grains, and low-fat or nonfat dairy products. Managing pain, stiffness, and swelling  Try massaging, stretching, and relaxing the affected muscle. Do this for several minutes at a time.  If directed, put ice on areas that are sore or painful after a cramp. To do this: ? Put ice in a plastic bag. ? Place a towel between your skin and the bag. ? Leave the ice on for 20 minutes, 2-3 times a day. ? Remove the ice if your skin turns bright red. This is very important. If you  cannot feel pain, heat, or cold, you have a greater risk of damage to the area.  If directed, apply heat to muscles that are tense or tight. Do this before you exercise, or as often as told by your health care provider. Use the heat source that your health care provider recommends, such as a moist heat pack or a heating pad. To do this: ? Place a towel between your skin and the heat source. ? Leave the heat on for 20-30 minutes. ? Remove the heat if your skin turns bright red. This is especially important if you are unable to feel pain, heat, or cold. You may have a greater risk of getting burned.  Try taking hot showers or baths to help relax tight muscles.      General instructions  If you are having frequent leg cramps, avoid intense exercise for several days.  Take over-the-counter and prescription medicines only as told by your health care provider.  Keep all follow-up visits. This is important. Contact a health care  provider if:  Your leg cramps get more severe or more frequent, or they do not improve over time.  Your foot becomes cold, numb, or blue. Summary  Muscle cramps can develop in any muscle, but the most common place is in the calf muscles of the leg.  Leg cramps are painful, and they may last for a few seconds to a few minutes.  Usually, leg cramps are not caused by a serious medical problem. Often, the cause is not known.  Stay hydrated, and take over-the-counter and prescription medicines only as told by your health care provider. This information is not intended to replace advice given to you by your health care provider. Make sure you discuss any questions you have with your health care provider. Document Revised: 05/08/2019 Document Reviewed: 05/08/2019 Elsevier Patient Education  [place].

## 2020-05-28 DIAGNOSIS — M1712 Unilateral primary osteoarthritis, left knee: Secondary | ICD-10-CM | POA: Diagnosis not present

## 2020-05-28 DIAGNOSIS — M7122 Synovial cyst of popliteal space [Baker], left knee: Secondary | ICD-10-CM | POA: Diagnosis not present

## 2020-05-28 DIAGNOSIS — M11262 Other chondrocalcinosis, left knee: Secondary | ICD-10-CM | POA: Diagnosis not present

## 2020-05-28 NOTE — Progress Notes (Signed)
CBC within normal limits.  CMP ok - glucose not fasting but is quite elevated, watch diet. D- Dimer elevated scantly however patient is post surgery.

## 2020-05-28 NOTE — Progress Notes (Signed)
  OTHER Patient has been called by provider, discussed all results with Dr. Derrel Nip.  Referred back to orthopedics left popliteal cyst.  Posterior popliteal fossa cyst measuring 4.7 x 0.8 x 2.3 cm compatible with Baker cyst.  Limitations: none  IMPRESSION: Negative for left leg DVT  Baker cyst   Electronically Signed   By: Franchot Gallo M.D.   On: 05/27/2020 14:51

## 2020-05-29 DIAGNOSIS — D2261 Melanocytic nevi of right upper limb, including shoulder: Secondary | ICD-10-CM | POA: Diagnosis not present

## 2020-05-29 DIAGNOSIS — D485 Neoplasm of uncertain behavior of skin: Secondary | ICD-10-CM | POA: Diagnosis not present

## 2020-05-29 DIAGNOSIS — L400 Psoriasis vulgaris: Secondary | ICD-10-CM | POA: Diagnosis not present

## 2020-05-29 DIAGNOSIS — D2271 Melanocytic nevi of right lower limb, including hip: Secondary | ICD-10-CM | POA: Diagnosis not present

## 2020-05-29 DIAGNOSIS — L570 Actinic keratosis: Secondary | ICD-10-CM | POA: Diagnosis not present

## 2020-05-29 DIAGNOSIS — D2262 Melanocytic nevi of left upper limb, including shoulder: Secondary | ICD-10-CM | POA: Diagnosis not present

## 2020-05-29 DIAGNOSIS — L821 Other seborrheic keratosis: Secondary | ICD-10-CM | POA: Diagnosis not present

## 2020-06-10 ENCOUNTER — Encounter: Payer: Self-pay | Admitting: Adult Health

## 2020-06-10 ENCOUNTER — Other Ambulatory Visit: Payer: Self-pay

## 2020-06-10 ENCOUNTER — Ambulatory Visit (INDEPENDENT_AMBULATORY_CARE_PROVIDER_SITE_OTHER): Payer: Medicare Other | Admitting: Adult Health

## 2020-06-10 VITALS — BP 132/86 | HR 76 | Temp 98.1°F | Ht 59.02 in | Wt 132.4 lb

## 2020-06-10 DIAGNOSIS — M7122 Synovial cyst of popliteal space [Baker], left knee: Secondary | ICD-10-CM

## 2020-06-10 NOTE — Patient Instructions (Signed)
Baker Cyst  A Baker cyst, also called a popliteal cyst, is a growth that forms at the back of the knee. The cyst forms when the fluid-filled sac (bursa) that cushions the knee joint becomes enlarged. What are the causes? In most cases, a Baker cyst results from another knee problem that causes swelling inside the knee. This makes the fluid inside the knee joint (synovial fluid) flow into the bursa behind the knee, causing the bursa to enlarge. What increases the risk? You may be more likely to develop a Baker cyst if you already have a knee problem, such as:  A tear in cartilage that cushions the knee joint (meniscal tear).  A tear in the tissues that connect the bones of the knee joint (ligament tear).  Knee swelling from osteoarthritis, rheumatoid arthritis, or gout. What are the signs or symptoms? The main symptom of this condition is a lump behind the knee. This may be the only symptom of the condition. The lump may be painful, especially when the knee is straightened. If the lump is painful, the pain may come and go. The knee may also be stiff. Symptoms may quickly get more severe if the cyst breaks open (ruptures). If the cyst ruptures, you may feel the following in your knee and calf:  Sudden or worsening pain.  Swelling.  Bruising.  Redness in the calf. A Baker cyst does not always cause symptoms. How is this diagnosed? This condition may be diagnosed based on your symptoms and medical history. Your health care provider will also do a physical exam. This may include:  Feeling the cyst to check whether it is tender.  Checking your knee for signs of another knee condition that causes swelling. You may have imaging tests, such as:  X-rays.  MRI.  Ultrasound. How is this treated? A Baker cyst that is not painful may go away without treatment. If the cyst gets large or painful, it will likely get better if the underlying knee problem is treated. If needed, treatment for a  Baker cyst may include:  Resting.  Keeping weight off of the knee. This means not leaning on the knee to support your body weight.  Taking NSAIDs, such as ibuprofen, to reduce pain and swelling.  Having a procedure to drain the fluid from the cyst with a needle (aspiration). You may also get an injection of a medicine that reduces swelling (steroid).  Having surgery. This may be needed if other treatments do not work. This usually involves correcting knee damage and removing the cyst. Follow these instructions at home: Activity  Rest as told by your health care provider.  Avoid activities that make pain or swelling worse.  Return to your normal activities as told by your health care provider. Ask your health care provider what activities are safe for you.  Do not use the injured limb to support your body weight until your health care provider says that you can. Use crutches as told by your health care provider. General instructions  Take over-the-counter and prescription medicines only as told by your health care provider.  Keep all follow-up visits as told by your health care provider. This is important.   Contact a health care provider if:  You have knee pain, stiffness, or swelling that does not get better. Get help right away if:  You have sudden or worsening pain and swelling in your calf area. Summary  A Baker cyst, also called a popliteal cyst, is a growth that forms at   the back of the knee.  In most cases, a Baker cyst results from another knee problem that causes swelling inside the knee.  A Baker cyst that is not painful may go away without treatment.  If needed, treatment for a Baker cyst may include resting, keeping weight off of the knee, medicines, or draining fluid from the cyst.  Surgery may be needed if other treatments are not effective. This information is not intended to replace advice given to you by your health care provider. Make sure you discuss any  questions you have with your health care provider. Document Revised: 05/04/2018 Document Reviewed: 05/04/2018 Elsevier Patient Education  2021 Elsevier Inc.  

## 2020-06-10 NOTE — Progress Notes (Signed)
Established Patient Office Visit  Subjective:  Patient ID: Emily Ryan, female    DOB: 08/20/1945  Age: 75 y.o. MRN: [id number]  CC:  Chief Complaint  Patient presents with  . Follow-up    HPI Emily Ryan presents for she had left popliteal cyst drained by PA at Dr. Roland Rack office, and gave her a shot of cortisone,she reports all of her pain is now gone.  She had an ultrasound rule out DVT and verify popliteal cyst given positive D dimer likely as a result of her recent shoulder surgery.   Negative for DVT and positive for popliteal cyst left.   Patient  denies any fever, body aches,chills, rash, chest pain, shortness of breath, nausea, vomiting, or diarrhea.   Past Medical History:  Diagnosis Date  . Complicated pregnancy    1st pregnancy complicated by post operative hemorrhage and 2ng complicated by epidural  . Diabetes mellitus   . Diverticulosis of colon (without mention of hemorrhage)   . Fracture of malleolus of right ankle, closed, with routine healing, subsequent encounter 12/09/2017  . GERD (gastroesophageal reflux disease)   . Guttate psoriasis   . Hyperlipidemia   . Hypertension   . Menopausal disorder   . Rectal stricture   . Skin cancer     Past Surgical History:  Procedure Laterality Date  . ABDOMINAL HYSTERECTOMY    . APPENDECTOMY  Dec 2012  . BLADDER REPAIR  1991   lift   . COLON RESECTION SIGMOID N/A 02/19/2018   Procedure: LAPAROSCOPIC SIGMOID COLECTOMY- POSSIBLE OPEN;  Surgeon: Olean Ree, MD;  Location: [facility] ORS;  Service: General;  Laterality: N/A;  . COLONOSCOPY WITH PROPOFOL N/A 07/17/2017   Procedure: COLONOSCOPY WITH PROPOFOL;  Surgeon: Lin Landsman, MD;  Location: [facility] ENDOSCOPY;  Service: Gastroenterology;  Laterality: N/A;  . COLONOSCOPY WITH PROPOFOL N/A 11/02/2017   Procedure: COLONOSCOPY WITH PROPOFOL;  Surgeon: Lin Landsman, MD;  Location: [facility]OSCOPY;  Service: Gastroenterology;  Laterality: N/A;  . CYSTOCELE  REPAIR    . CYSTOSCOPY W/ URETERAL STENT PLACEMENT Bilateral 02/19/2018   Procedure: CYSTOSCOPY WITH STENT PLACEMENT GOING 1ST;  Surgeon: Billey Co, MD;  Location: [facility] ORS;  Service: Urology;  Laterality: Bilateral;  . FLEXIBLE SIGMOIDOSCOPY N/A 07/31/2017   Procedure: FLEXIBLE SIGMOIDOSCOPY;  Surgeon: Lin Landsman, MD;  Location: [facility] ENDOSCOPY;  Service: Gastroenterology;  Laterality: N/A;  . HERNIA REPAIR    . MOHS SURGERY N/A 05/2015   Face  . PARTIAL COLECTOMY  Nov 2012   left, secondary to diverticular perf  . RECTOCELE REPAIR    . REVERSE SHOULDER ARTHROPLASTY Right 02/26/2020   Procedure: REVERSE SHOULDER ARTHROPLASTY;  Surgeon: Corky Mull, MD;  Location: [facility] ORS;  Service: Orthopedics;  Laterality: Right;  . SEPTOPLASTY  1969  . TEE WITHOUT CARDIOVERSION N/A 11/24/2015   Procedure: TRANSESOPHAGEAL ECHOCARDIOGRAM (TEE);  Surgeon: Minna Merritts, MD;  Location: [facility] ORS;  Service: Cardiovascular;  Laterality: N/A;  . TONSILLECTOMY  1953  . VARICOSE VEIN SURGERY  1974   right leg     Family History  Problem Relation Age of Onset  . Heart Problems Mother   . Macular degeneration Mother   . Cancer Father   . Cancer Maternal Grandmother        colon ca  . Diabetes Sister   . Dementia Sister   . Diabetes Brother   . Stroke Brother   . Diabetes Brother   . Breast cancer Maternal Aunt   . Breast  cancer Paternal Aunt   . Lung cancer Cousin   . Lung disease Neg Hx   . Rheumatologic disease Neg Hx     Social History   Socioeconomic History  . Marital status: Married    Spouse name: Not on file  . Number of children: Not on file  . Years of education: Not on file  . Highest education level: Not on file  Occupational History  . Not on file  Tobacco Use  . Smoking status: Never Smoker  . Smokeless tobacco: Never Used  . Tobacco comment: father growing up  Vaping Use  . Vaping Use: Never used  Substance and Sexual Activity  . Alcohol use: Yes     Comment: Occasional  . Drug use: No  . Sexual activity: Yes    Birth control/protection: Post-menopausal  Other Topics Concern  . Not on file  Social History Narrative   [facility] (11/14/16):   She is originally from [place]. Previously has worked as a Network engineer, in [employer], & in [employer]. Her husband was in [employer] and they traveled extensively. She lived in [place], [place], [place], [place], [place], [place]. She has 2 cats currently. Remote parakeet exposure as a child. No mold exposure. No recent hot tub exposure.    Social Determinants of Health   Financial Resource Strain: Low Risk   . Difficulty of Paying Living Expenses: Not hard at all  Food Insecurity: No Food Insecurity  . Worried About Charity fundraiser in the Last Year: Never true  . Ran Out of Food in the Last Year: Never true  Transportation Needs: No Transportation Needs  . Lack of Transportation (Medical): No  . Lack of Transportation (Non-Medical): No  Physical Activity: Not on file  Stress: No Stress Concern Present  . Feeling of Stress : Not at all  Social Connections: Socially Integrated  . Frequency of Communication with Friends and Family: More than three times a week  . Frequency of Social Gatherings with Friends and Family: More than three times a week  . Attends Religious Services: More than 4 times per year  . Active Member of Clubs or Organizations: Yes  . Attends Archivist Meetings: Not on file  . Marital Status: Married  Human resources officer Violence: Not At Risk  . Fear of Current or Ex-Partner: No  . Emotionally Abused: No  . Physically Abused: No  . Sexually Abused: No    Outpatient Medications Prior to Visit  Medication Sig Dispense Refill  . amLODipine (NORVASC) 2.5 MG tablet TAKE 1 TABLET(2.5 MG) BY MOUTH DAILY (Patient taking differently: Take 2.5 mg by mouth every evening.) 90 tablet 1  . Ascorbic Acid (VITAMIN C) 1000 MG tablet Take 1,000 mg by mouth daily.     Marland Kitchen aspirin EC 81 MG tablet  Take 81 mg by mouth at bedtime. Swallow whole.    . Calcium Carbonate-Vitamin D3 600-400 MG-UNIT TABS Take 1 tablet by mouth daily.    . clobetasol cream (TEMOVATE) 8.11 % Apply 1 application topically 2 (two) times daily as needed (psoriasis).    . conjugated estrogens (PREMARIN) vaginal cream Place 1 Applicatorful vaginally daily. (Patient taking differently: Place 1 Applicatorful vaginally daily as needed (dryness/irritation).) 42.5 g 3  . Dulaglutide (TRULICITY) 1.5 BJ/4.7WG SOPN Inject 1.5 mg into the skin once a week. (Patient taking differently: Inject 1.5 mg into the skin every Sunday.) 6 mL 3  . escitalopram (LEXAPRO) 5 MG tablet Take 1 tablet (5 mg  total) by mouth every evening. 90 tablet 1  . ezetimibe (ZETIA) 10 MG tablet Take 1 tablet (10 mg total) by mouth daily. 90 tablet 1  . folic acid (FOLVITE) 1 MG tablet Take 1 tablet (1 mg total) by mouth See admin instructions. Takes daily except on Saturday 90 tablet 3  . glipiZIDE (GLUCOTROL XL) 10 MG 24 hr tablet Take 1 tablet (10 mg total) by mouth daily with breakfast. 90 tablet 2  . glucose blood (FREESTYLE LITE) test strip Use as instructed to test blood sugar twice daily 200 each 3  . hydrochlorothiazide (HYDRODIURIL) 12.5 MG tablet Take 1 tablet (12.5 mg total) by mouth daily. 90 tablet 3  . loratadine (CLARITIN) 10 MG tablet Take 1 tablet (10 mg total) by mouth daily. 90 tablet 1  . losartan (COZAAR) 100 MG tablet Take 1 tablet (100 mg total) by mouth daily. 90 tablet 1  . metFORMIN (GLUCOPHAGE XR) 750 MG 24 hr tablet Take 2 tablets (1,500 mg total) by mouth daily with breakfast. 180 tablet 1  . methotrexate (RHEUMATREX) 2.5 MG tablet Take 7.5 mg by mouth every Saturday.    . methotrexate (RHEUMATREX) 2.5 MG tablet Take 3 tablets (7.5 mg total) by mouth once a week. Caution:Chemotherapy. Protect from light. 36 tablet 1  . montelukast (SINGULAIR) 10 MG tablet Take 1 tablet (10 mg total) by mouth at bedtime. 90 tablet 3  . Multiple  Vitamin (MULTIVITAMIN) capsule Take 1 capsule by mouth daily.    Marland Kitchen omeprazole (PRILOSEC) 20 MG capsule Take 1 capsule (20 mg total) by mouth daily. 90 capsule 1  . Precision Thin Lancets MISC Use as directed to check sugars twice daily 180 each 3  . simvastatin (ZOCOR) 20 MG tablet Take 1 tablet (20 mg total) by mouth at bedtime. 90 tablet 3  . traMADol (ULTRAM) 50 MG tablet Take 1 tablet (50 mg total) by mouth every 6 (six) hours as needed (Breakthrough pain). 30 tablet 0  . vitamin E 400 UNIT capsule Take 400 Units by mouth daily.     No facility-administered medications prior to visit.    Allergies  Allergen Reactions  . Prednisone Hives    ROS Review of Systems  Constitutional: Negative.   HENT: Negative.   Respiratory: Negative.   Cardiovascular: Negative.   Gastrointestinal: Negative.   Genitourinary: Negative.   Musculoskeletal: Negative.   Neurological: Negative.   Hematological: Negative.   Psychiatric/Behavioral: Negative.       Objective:    Physical Exam Vitals and nursing note reviewed.  Constitutional:      General: She is not in acute distress.    Appearance: She is not ill-appearing, toxic-appearing or diaphoretic.     Comments: Patient is alert and oriented and responsive to questions Engages in eye contact with provider. Speaks in full sentences without any pauses without any shortness of breath or distress.   HENT:     Head: Normocephalic and atraumatic.     Right Ear: External ear normal.     Left Ear: External ear normal.     Nose: Nose normal.     Mouth/Throat:     Mouth: Mucous membranes are moist.     Pharynx: No oropharyngeal exudate or posterior oropharyngeal erythema.  Eyes:     Conjunctiva/sclera: Conjunctivae normal.  Cardiovascular:     Rate and Rhythm: Normal rate and regular rhythm.     Pulses: Normal pulses.     Heart sounds: Normal heart sounds.  Pulmonary:  Effort: Pulmonary effort is normal.     Breath sounds: Normal breath  sounds.  Abdominal:     Palpations: Abdomen is soft.  Musculoskeletal:        General: No swelling. Normal range of motion.     Cervical back: Normal range of motion and neck supple.     Right lower leg: No edema.     Left lower leg: No edema.  Lymphadenopathy:     Cervical: No cervical adenopathy.  Skin:    General: Skin is warm.     Findings: No erythema or rash.  Neurological:     Mental Status: She is oriented to person, place, and time.     Gait: Gait normal.  Psychiatric:        Mood and Affect: Mood normal.        Behavior: Behavior normal.        Thought Content: Thought content normal.        Judgment: Judgment normal.     BP 132/86   Pulse 76   Temp 98.1 F (36.7 C)   Ht 4' 11.02" (1.499 m)   Wt 132 lb 6.4 oz (60.1 kg)   SpO2 97%   BMI 26.73 kg/m  Wt Readings from Last 3 Encounters:  06/10/20 132 lb 6.4 oz (60.1 kg)  05/27/20 135 lb 9.6 oz (61.5 kg)  04/22/20 135 lb 3.2 oz (61.3 kg)     Health Maintenance Due  Topic Date Due  . Pneumococcal Vaccine 35-64 Years old (1 of 4 - PCV13) Never done  . OPHTHALMOLOGY EXAM  03/17/2020    There are no preventive care reminders to display for this patient.  Lab Results  Component Value Date   TSH 3.11 01/14/2020   Lab Results  Component Value Date   WBC 6.2 05/27/2020   HGB 14.5 05/27/2020   HCT 42.3 05/27/2020   MCV 98.2 05/27/2020   PLT 263.0 05/27/2020   Lab Results  Component Value Date   NA 136 05/27/2020   K 4.1 05/27/2020   CO2 27 05/27/2020   GLUCOSE 279 (H) 05/27/2020   BUN 12 05/27/2020   CREATININE 0.62 05/27/2020   BILITOT 0.6 05/27/2020   ALKPHOS 94 05/27/2020   AST 22 05/27/2020   ALT 32 05/27/2020   PROT 6.7 05/27/2020   ALBUMIN 4.2 05/27/2020   CALCIUM 9.2 05/27/2020   ANIONGAP 12 02/19/2020   GFR 87.02 05/27/2020   Lab Results  Component Value Date   CHOL 154 01/14/2020   Lab Results  Component Value Date   HDL 56.20 01/14/2020   Lab Results  Component Value Date    LDLCALC 71 01/14/2020   Lab Results  Component Value Date   TRIG 138.0 01/14/2020   Lab Results  Component Value Date   CHOLHDL 3 01/14/2020   Lab Results  Component Value Date   HGBA1C 7.5 (H) 04/16/2020      Assessment & Plan:   Problem List Items Addressed This Visit   None   Visit Diagnoses    Popliteal cyst, left    -  Primary     Popliteal cyst resolved was drained and followed now by orthopedics.  Follow up as needed. She has no other concerns today.  No orders of the defined types were placed in this encounter.   Follow-up: Return if symptoms worsen or fail to improve, for at any time for any worsening symptoms, Go to Emergency room/ urgent care if worse.    [place]  Lorrinda Ramstad, FNP

## 2020-06-11 ENCOUNTER — Other Ambulatory Visit: Payer: Self-pay | Admitting: Internal Medicine

## 2020-06-11 DIAGNOSIS — Z79899 Other long term (current) drug therapy: Secondary | ICD-10-CM | POA: Diagnosis not present

## 2020-06-12 ENCOUNTER — Emergency Department: Payer: Medicare Other

## 2020-06-12 ENCOUNTER — Emergency Department
Admission: EM | Admit: 2020-06-12 | Discharge: 2020-06-12 | Disposition: A | Discharge status: Home / Self Care | Payer: Medicare Other | Attending: Physician Assistant | Admitting: Physician Assistant

## 2020-06-12 ENCOUNTER — Ambulatory Visit (INDEPENDENT_AMBULATORY_CARE_PROVIDER_SITE_OTHER): Payer: Medicare Other

## 2020-06-12 ENCOUNTER — Ambulatory Visit
Admission: EM | Admit: 2020-06-12 | Discharge: 2020-06-12 | Disposition: A | Discharge status: Home / Self Care | Payer: Medicare Other | Attending: Emergency Medicine | Admitting: Emergency Medicine

## 2020-06-12 ENCOUNTER — Telehealth: Payer: Self-pay | Admitting: Internal Medicine

## 2020-06-12 ENCOUNTER — Other Ambulatory Visit: Payer: Self-pay

## 2020-06-12 DIAGNOSIS — E1169 Type 2 diabetes mellitus with other specified complication: Secondary | ICD-10-CM | POA: Insufficient documentation

## 2020-06-12 DIAGNOSIS — M79661 Pain in right lower leg: Secondary | ICD-10-CM | POA: Insufficient documentation

## 2020-06-12 DIAGNOSIS — Z96611 Presence of right artificial shoulder joint: Secondary | ICD-10-CM | POA: Insufficient documentation

## 2020-06-12 DIAGNOSIS — E782 Mixed hyperlipidemia: Secondary | ICD-10-CM | POA: Insufficient documentation

## 2020-06-12 DIAGNOSIS — M79604 Pain in right leg: Secondary | ICD-10-CM | POA: Diagnosis not present

## 2020-06-12 DIAGNOSIS — Z7984 Long term (current) use of oral hypoglycemic drugs: Secondary | ICD-10-CM | POA: Diagnosis not present

## 2020-06-12 DIAGNOSIS — Z85828 Personal history of other malignant neoplasm of skin: Secondary | ICD-10-CM | POA: Diagnosis not present

## 2020-06-12 DIAGNOSIS — Z79899 Other long term (current) drug therapy: Secondary | ICD-10-CM | POA: Diagnosis not present

## 2020-06-12 DIAGNOSIS — M25551 Pain in right hip: Secondary | ICD-10-CM | POA: Diagnosis present

## 2020-06-12 DIAGNOSIS — M5416 Radiculopathy, lumbar region: Secondary | ICD-10-CM | POA: Insufficient documentation

## 2020-06-12 DIAGNOSIS — Z7982 Long term (current) use of aspirin: Secondary | ICD-10-CM | POA: Diagnosis not present

## 2020-06-12 DIAGNOSIS — I10 Essential (primary) hypertension: Secondary | ICD-10-CM | POA: Diagnosis not present

## 2020-06-12 DIAGNOSIS — R52 Pain, unspecified: Secondary | ICD-10-CM

## 2020-06-12 DIAGNOSIS — M1711 Unilateral primary osteoarthritis, right knee: Secondary | ICD-10-CM | POA: Diagnosis not present

## 2020-06-12 DIAGNOSIS — M545 Low back pain, unspecified: Secondary | ICD-10-CM | POA: Diagnosis not present

## 2020-06-12 DIAGNOSIS — M79651 Pain in right thigh: Secondary | ICD-10-CM | POA: Diagnosis not present

## 2020-06-12 DIAGNOSIS — M5410 Radiculopathy, site unspecified: Secondary | ICD-10-CM

## 2020-06-12 MED ORDER — NAPROXEN 500 MG PO TABS: 500.0000 mg | ORAL_TABLET | Freq: Two times a day (BID) | ORAL | 0 refills | Status: AC

## 2020-06-12 MED ORDER — CYCLOBENZAPRINE HCL 5 MG PO TABS: 5.0000 mg | ORAL_TABLET | Freq: Three times a day (TID) | ORAL | 0 refills | Status: DC | PRN

## 2020-06-12 MED ORDER — KETOROLAC TROMETHAMINE 30 MG/ML IJ SOLN
15.0000 mg | Freq: Once | INTRAMUSCULAR | Status: AC
  Administered 2020-06-12: 15 mg via INTRAMUSCULAR
  Filled 2020-06-12: qty 1

## 2020-06-12 NOTE — Telephone Encounter (Signed)
I called the patient to inform her to go to urgent care and she stated she is at the ER getting evaluated.  Chessie Neuharth,cma

## 2020-06-12 NOTE — Telephone Encounter (Signed)
Transferred to access nurse  Pt stated that she is having right leg pain above the knee, hurts to walk   Access nurse aware no appts available

## 2020-06-12 NOTE — Discharge Instructions (Addendum)
Take the Prescription meds as directed.  Follow-up with your provider or Dr. Roland Rack as discussed.

## 2020-06-12 NOTE — ED Triage Notes (Signed)
Pt was seen at [facility] today and was sent over here to rule out blood clot. Pt advised they did do Xrays at [facility] and then she came here. Pt BP was also elevated at [facility]. No swelling or redness noted. Pt ambulatory to triage room. Pt complaints that pain radiates into her buttocks region at times. Pt in NAD.

## 2020-06-12 NOTE — ED Notes (Signed)
See triage note  Presents with pain to right lower leg  States pain started about 2-3 days ago  Pain became worse over the the last day  Denies any injury but states pain is from knee into hip  No swelling ambulates with slight limp

## 2020-06-12 NOTE — ED Provider Notes (Signed)
Emily Ryan    CSN: [id number] Arrival date & time: 06/12/20  1023      History   Chief Complaint Chief Complaint  Patient presents with  . Leg Pain    HPI Emily Ryan is a 75 y.o. female.  Patient presents with 2-day history of pain in her right leg from just above her knee radiating to her right hip.  No falls or injury.  She denies numbness, weakness, paresthesias, wounds, redness, bruising, or other symptoms.  The pain is worse with movement and ambulation; Improves with rest.  She reports she took ibuprofen PM for the discomfort last night but no medication for discomfort today.  Patient's medical history includes hypertension, diabetes, diverticulosis, GERD.  The history is provided by the patient and medical records.   Past Medical History:  Diagnosis Date  . Complicated pregnancy    1st pregnancy complicated by post operative hemorrhage and 2ng complicated by epidural  . Diabetes mellitus   . Diverticulosis of colon (without mention of hemorrhage)   . Fracture of malleolus of right ankle, closed, with routine healing, subsequent encounter 12/09/2017  . GERD (gastroesophageal reflux disease)   . Guttate psoriasis   . Hyperlipidemia   . Hypertension   . Menopausal disorder   . Rectal stricture   . Skin cancer     Patient Active Problem List   Diagnosis Date Noted  . Leg pain, posterior, left 05/27/2020  . Recent major surgery 05/27/2020  . S/P shoulder replacement, right 04/24/2020  . Status post reverse total shoulder replacement, right 04/06/2020  . Anastomotic stricture of colorectal region 03/10/2020  . Hemorrhage of colon due to diverticulosis 03/10/2020  . Rotator cuff arthropathy, right 11/25/2019  . Travel advice encounter 10/15/2019  . Fall at home, initial encounter 10/14/2019  . Atherosclerosis of aorta ([place]) 10/14/2019  . Vaginal atrophy 08/20/2019  . Dyspareunia in female 08/20/2019  . Perineal rash in female 04/12/2019  . Hearing  loss 04/12/2019  . Breast calcifications on mammogram 09/13/2018  . Family history of Alzheimer's disease 08/13/2018  . Macrocytosis without anemia 04/14/2018  . Hx of colonic polyps   . S/P partial colectomy 08/05/2017  . Tubular adenoma of colon 08/03/2017  . Overweight (BMI 25.0-29.9) 05/04/2016  . Basal cell carcinoma of right forehead 08/28/2015  . Patent foramen ovale with right to left shunt 08/28/2015  . Postmenopausal estrogen deficiency 05/07/2015  . DM type 2 with diabetic mixed hyperlipidemia ([place]) 02/08/2015  . Uncontrolled type 2 diabetes mellitus ([place]) 02/08/2015  . Allergic rhinitis 01/28/2013  . Basal cell carcinoma of left side of nose 04/28/2012  . Long-term use of high-risk medication 10/21/2011  . Routine general medical examination at a health care facility 07/18/2011  . Guttate psoriasis 04/04/2011  . Constipation 01/19/2011  . Reflux esophagitis 01/19/2011  . Diverticulosis of sigmoid colon 11/10/2010  . White coat syndrome with diagnosis of hypertension 09/07/2010  . Hyperlipidemia associated with type 2 diabetes mellitus ([place]) 09/07/2010  . Hyperlipidemia 09/07/2010    Past Surgical History:  Procedure Laterality Date  . ABDOMINAL HYSTERECTOMY    . APPENDECTOMY  Dec 2012  . BLADDER REPAIR  1991   lift   . COLON RESECTION SIGMOID N/A 02/19/2018   Procedure: LAPAROSCOPIC SIGMOID COLECTOMY- POSSIBLE OPEN;  Surgeon: Olean Ree, MD;  Location: [facility] ORS;  Service: General;  Laterality: N/A;  . COLONOSCOPY WITH PROPOFOL N/A 07/17/2017   Procedure: COLONOSCOPY WITH PROPOFOL;  Surgeon: Lin Landsman, MD;  Location: [facility];  Service: Gastroenterology;  Laterality: N/A;  . COLONOSCOPY WITH PROPOFOL N/A 11/02/2017   Procedure: COLONOSCOPY WITH PROPOFOL;  Surgeon: Lin Landsman, MD;  Location: [facility] ENDOSCOPY;  Service: Gastroenterology;  Laterality: N/A;  . CYSTOCELE REPAIR    . CYSTOSCOPY W/ URETERAL STENT PLACEMENT Bilateral 02/19/2018    Procedure: CYSTOSCOPY WITH STENT PLACEMENT GOING 1ST;  Surgeon: Billey Co, MD;  Location: [facility] ORS;  Service: Urology;  Laterality: Bilateral;  . FLEXIBLE SIGMOIDOSCOPY N/A 07/31/2017   Procedure: FLEXIBLE SIGMOIDOSCOPY;  Surgeon: Lin Landsman, MD;  Location: [facility] ENDOSCOPY;  Service: Gastroenterology;  Laterality: N/A;  . HERNIA REPAIR    . MOHS SURGERY N/A 05/2015   Face  . PARTIAL COLECTOMY  Nov 2012   left, secondary to diverticular perf  . RECTOCELE REPAIR    . REVERSE SHOULDER ARTHROPLASTY Right 02/26/2020   Procedure: REVERSE SHOULDER ARTHROPLASTY;  Surgeon: Corky Mull, MD;  Location: [facility] ORS;  Service: Orthopedics;  Laterality: Right;  . SEPTOPLASTY  1969  . TEE WITHOUT CARDIOVERSION N/A 11/24/2015   Procedure: TRANSESOPHAGEAL ECHOCARDIOGRAM (TEE);  Surgeon: Minna Merritts, MD;  Location: [facility] ORS;  Service: Cardiovascular;  Laterality: N/A;  . TONSILLECTOMY  1953  . VARICOSE VEIN SURGERY  1974   right leg     OB History     Gravida  2   Para  2   Term  2   Preterm      AB      Living  2      SAB      IAB      Ectopic      Multiple      Live Births  2        Obstetric Comments  1st Menstrual Cycle:  13  1st Pregnancy:  24           Home Medications    Prior to Admission medications   Medication Sig Start Date End Date Taking? Authorizing Provider  amLODipine (NORVASC) 2.5 MG tablet Take 1 tablet (2.5 mg total) by mouth every evening. 06/11/20   Crecencio Mc, MD  Ascorbic Acid (VITAMIN C) 1000 MG tablet Take 1,000 mg by mouth daily.     [provider]  aspirin EC 81 MG tablet Take 81 mg by mouth at bedtime. Swallow whole.    [provider]  Calcium Carbonate-Vitamin D3 600-400 MG-UNIT TABS Take 1 tablet by mouth daily.    [provider]  clobetasol cream (TEMOVATE) 1.32 % Apply 1 application topically 2 (two) times daily as needed (psoriasis). 04/22/19   [provider]  conjugated  estrogens (PREMARIN) vaginal cream Place 1 Applicatorful vaginally daily. Patient taking differently: Place 1 Applicatorful vaginally daily as needed (dryness/irritation). 05/08/19   Rubie Maid, MD  Dulaglutide (TRULICITY) 1.5 GM/0.1UU SOPN Inject 1.5 mg into the skin once a week. Patient taking differently: Inject 1.5 mg into the skin every Sunday. 01/23/20   Crecencio Mc, MD  escitalopram (LEXAPRO) 5 MG tablet Take 1 tablet (5 mg total) by mouth every evening. 03/13/20   Crecencio Mc, MD  ezetimibe (ZETIA) 10 MG tablet Take 1 tablet (10 mg total) by mouth daily. 03/13/20   Crecencio Mc, MD  folic acid (FOLVITE) 1 MG tablet Take 1 tablet (1 mg total) by mouth See admin instructions. Takes daily except on Saturday 03/13/20   Crecencio Mc, MD  glipiZIDE (GLUCOTROL XL) 10 MG 24 hr tablet Take 1 tablet (10 mg total) by mouth  daily with breakfast. 01/17/20   Crecencio Mc, MD  glucose blood (FREESTYLE LITE) test strip Use as instructed to test blood sugar twice daily 01/17/20   Crecencio Mc, MD  hydrochlorothiazide (HYDRODIURIL) 12.5 MG tablet Take 1 tablet (12.5 mg total) by mouth daily. 12/12/19   Crecencio Mc, MD  loratadine (CLARITIN) 10 MG tablet Take 1 tablet (10 mg total) by mouth daily. 03/13/20   Crecencio Mc, MD  losartan (COZAAR) 100 MG tablet Take 1 tablet (100 mg total) by mouth daily. 03/13/20   Crecencio Mc, MD  metFORMIN (GLUCOPHAGE XR) 750 MG 24 hr tablet Take 2 tablets (1,500 mg total) by mouth daily with breakfast. 04/22/20   Crecencio Mc, MD  methotrexate (RHEUMATREX) 2.5 MG tablet Take 7.5 mg by mouth every Saturday.    [provider]  methotrexate (RHEUMATREX) 2.5 MG tablet Take 3 tablets (7.5 mg total) by mouth once a week. Caution:Chemotherapy. Protect from light. 04/22/20   Crecencio Mc, MD  montelukast (SINGULAIR) 10 MG tablet Take 1 tablet (10 mg total) by mouth at bedtime. 12/12/19   Crecencio Mc, MD  Multiple Vitamin (MULTIVITAMIN) capsule  Take 1 capsule by mouth daily.    [provider]  omeprazole (PRILOSEC) 20 MG capsule Take 1 capsule (20 mg total) by mouth daily. 03/13/20   Crecencio Mc, MD  Precision Thin Lancets MISC Use as directed to check sugars twice daily 01/27/12   Crecencio Mc, MD  simvastatin (ZOCOR) 20 MG tablet Take 1 tablet (20 mg total) by mouth at bedtime. 12/12/19   Crecencio Mc, MD  traMADol (ULTRAM) 50 MG tablet Take 1 tablet (50 mg total) by mouth every 6 (six) hours as needed (Breakthrough pain). 02/26/20 02/25/21  Poggi, Marshall Cork, MD  vitamin E 400 UNIT capsule Take 400 Units by mouth daily.    [provider]    Family History Family History  Problem Relation Age of Onset  . Heart Problems Mother   . Macular degeneration Mother   . Cancer Father   . Cancer Maternal Grandmother        colon ca  . Diabetes Sister   . Dementia Sister   . Diabetes Brother   . Stroke Brother   . Diabetes Brother   . Breast cancer Maternal Aunt   . Breast cancer Paternal Aunt   . Lung cancer Cousin   . Lung disease Neg Hx   . Rheumatologic disease Neg Hx     Social History Social History   Tobacco Use  . Smoking status: Never  . Smokeless tobacco: Never  . Tobacco comments:    father growing up  Vaping Use  . Vaping Use: Never used  Substance Use Topics  . Alcohol use: Yes    Comment: Occasional  . Drug use: No     Allergies   Prednisone   Review of Systems Review of Systems  Constitutional:  Negative for chills and fever.  Respiratory:  Negative for cough and shortness of breath.   Cardiovascular:  Negative for chest pain and palpitations.  Gastrointestinal:  Negative for abdominal pain and vomiting.  Musculoskeletal:  Positive for arthralgias, gait problem and myalgias.  Skin:  Negative for color change and rash.  Neurological:  Negative for weakness and numbness.  All other systems reviewed and are negative.   Physical Exam Triage Vital Signs ED Triage Vitals   Enc Vitals Group     BP 06/12/20 1037 (!) 163/81  Pulse Rate 06/12/20 1037 73     Resp 06/12/20 1037 16     Temp 06/12/20 1037 98.1 F (36.7 C)     Temp Source 06/12/20 1037 Oral     SpO2 06/12/20 1037 95 %     Weight 06/12/20 1038 132 lb (59.9 kg)     Height 06/12/20 1038 4\' 11"  (1.499 m)     Head Circumference --      Peak Flow --      Pain Score 06/12/20 1038 7     Pain Loc --      Pain Edu? --      Excl. in [place]? --    No data found.  Updated Vital Signs BP (!) 163/81   Pulse 73   Temp 98.1 F (36.7 C) (Oral)   Resp 16   Ht 4\' 11"  (1.499 m)   Wt 132 lb (59.9 kg)   SpO2 95%   BMI 26.66 kg/m   Visual Acuity Right Eye Distance:   Left Eye Distance:   Bilateral Distance:    Right Eye Near:   Left Eye Near:    Bilateral Near:     Physical Exam Vitals and nursing note reviewed.  Constitutional:      General: She is not in acute distress.    Appearance: She is well-developed. She is not ill-appearing.  HENT:     Head: Normocephalic and atraumatic.     Mouth/Throat:     Mouth: Mucous membranes are moist.  Eyes:     Conjunctiva/sclera: Conjunctivae normal.  Cardiovascular:     Rate and Rhythm: Normal rate and regular rhythm.     Heart sounds: Normal heart sounds.  Pulmonary:     Effort: Pulmonary effort is normal. No respiratory distress.     Breath sounds: Normal breath sounds.  Abdominal:     Palpations: Abdomen is soft.     Tenderness: There is no abdominal tenderness.  Musculoskeletal:        General: Tenderness present. No swelling, deformity or signs of injury. Normal range of motion.     Cervical back: Neck supple.       Legs:     Comments: No shortening or rotation of lower extremities.  Skin:    General: Skin is warm and dry.     Findings: No bruising, erythema or rash.  Neurological:     General: No focal deficit present.     Mental Status: She is alert and oriented to person, place, and time.     Sensory: No sensory deficit.     Motor:  No weakness.  Psychiatric:        Mood and Affect: Mood normal.        Behavior: Behavior normal.     UC Treatments / Results  Labs (all labs ordered are listed, but only abnormal results are displayed) Labs Reviewed - No data to display  EKG   Radiology DG Femur Min 2 Views Right  Result Date: 06/12/2020 CLINICAL DATA:  RIGHT hip and upper leg pain for 2 days, constant pain increased with weight-bearing, pain radiates from leg up to hip, no injury EXAM: RIGHT FEMUR 2 VIEWS COMPARISON:  None FINDINGS: Osseous demineralization. Joint space narrowing at RIGHT knee. LEFT hip joint space preserved. No definite fracture, dislocation, or bone destruction. Scattered atherosclerotic calcifications. IMPRESSION: Osseous demineralization with degenerative changes RIGHT knee. No acute abnormalities. Electronically Signed   By: Lavonia Dana M.D.   On: 06/12/2020 12:06    Procedures  Procedures (including critical care time)  Medications Ordered in UC Medications - No data to display  Initial Impression / Assessment and Plan / UC Course  I have reviewed the triage vital signs and the nursing notes.  Pertinent labs & imaging results that were available during my care of the patient were reviewed by me and considered in my medical decision making (see chart for details).  Right leg pain.  Elevated blood pressure reading with known hypertension.  X-ray negative for acute abnormality.  Instructed patient to take Tylenol as needed for discomfort and to follow-up with her PCP or an orthopedist if her symptoms are not improving.  Also discussed that her blood pressure is elevated today and needs to be rechecked by her PCP in 2 to 4 weeks.  Education provided on managing hypertension.  Upon discussing results of x-ray and plan of care, patient states she would like to be checked for a blood clot.  Discussed with patient that she will need to be seen in the emergency department for this.  Instructed her to go  there now.  Patient agrees to plan of care.   Final Clinical Impressions(s) / UC Diagnoses   Final diagnoses:  Right leg pain  Elevated blood pressure reading in office with diagnosis of hypertension     Discharge Instructions      Your x-ray is normal.  Take Tylenol or ibuprofen as needed for discomfort.  Follow-up with your primary care provider or an orthopedist if your symptoms are not improving.    Your blood pressure is elevated today at 163/81.  Please have this rechecked by your primary care provider in 2-4 weeks.          ED Prescriptions   None    I have reviewed the PDMP during this encounter.   Sharion Balloon, NP 06/12/20 [phone]

## 2020-06-12 NOTE — Telephone Encounter (Signed)
Pt c/o right upper leg pain. Pt instructed to contact PCP within 4 hours.

## 2020-06-12 NOTE — ED Provider Notes (Signed)
[facility] Emergency Department Provider Note ____________________________________________  Time seen: 1552  I have reviewed the triage vital signs and the nursing notes.  HISTORY  Chief Complaint  Leg Pain   HPI Emily Ryan is a 75 y.o. female below medical history, presents to the ED with 3-day complaint of acute right-sided hip and thigh pain.  Patient denies any preceding injury, trauma, or fall.  She describes a deep pain to the right anterior lateral thigh from the hip to the knee joint.  She denies any discomfort below the knee.  She reports the pain causes weakness and limping in the leg.  She was evaluated by her PCP today, and was reported to have a negative x-ray of the right femur.  Patient presented to the ED after she raised concern about a possible DVT.  Apparently she was told by the on-call nurse to be evaluated for possible DVT.  Patient denies any lower extremity swelling, bruising, or temperature changes.  She also denies any bladder or bowel incontinence.  Past Medical History:  Diagnosis Date   Complicated pregnancy    1st pregnancy complicated by post operative hemorrhage and 2ng complicated by epidural   Diabetes mellitus    Diverticulosis of colon (without mention of hemorrhage)    Fracture of malleolus of right ankle, closed, with routine healing, subsequent encounter 12/09/2017   GERD (gastroesophageal reflux disease)    Guttate psoriasis    Hyperlipidemia    Hypertension    Menopausal disorder    Rectal stricture    Skin cancer     Patient Active Problem List   Diagnosis Date Noted   Leg pain, posterior, left 05/27/2020   Recent major surgery 05/27/2020   S/P shoulder replacement, right 04/24/2020   Status post reverse total shoulder replacement, right 04/06/2020   Anastomotic stricture of colorectal region 03/10/2020   Hemorrhage of colon due to diverticulosis 03/10/2020   Rotator cuff arthropathy, right 11/25/2019    Travel advice encounter 10/15/2019   Fall at home, initial encounter 10/14/2019   Atherosclerosis of aorta ([place]) 10/14/2019   Vaginal atrophy 08/20/2019   Dyspareunia in female 08/20/2019   Perineal rash in female 04/12/2019   Hearing loss 04/12/2019   Breast calcifications on mammogram 09/13/2018   Family history of Alzheimer's disease 08/13/2018   Macrocytosis without anemia 04/14/2018   Hx of colonic polyps    S/P partial colectomy 08/05/2017   Tubular adenoma of colon 08/03/2017   Overweight (BMI 25.0-29.9) 05/04/2016   Basal cell carcinoma of right forehead 08/28/2015   Patent foramen ovale with right to left shunt 08/28/2015   Postmenopausal estrogen deficiency 05/07/2015   DM type 2 with diabetic mixed hyperlipidemia ([place]) 02/08/2015   Uncontrolled type 2 diabetes mellitus ([place]) 02/08/2015   Allergic rhinitis 01/28/2013   Basal cell carcinoma of left side of nose 04/28/2012   Long-term use of high-risk medication 10/21/2011   Routine general medical examination at a health care facility 07/18/2011   Guttate psoriasis 04/04/2011   Constipation 01/19/2011   Reflux esophagitis 01/19/2011   Diverticulosis of sigmoid colon 11/10/2010   White coat syndrome with diagnosis of hypertension 09/07/2010   Hyperlipidemia associated with type 2 diabetes mellitus ([place]) 09/07/2010   Hyperlipidemia 09/07/2010    Past Surgical History:  Procedure Laterality Date   ABDOMINAL HYSTERECTOMY     APPENDECTOMY  Dec 2012   BLADDER REPAIR  1991   lift    COLON RESECTION SIGMOID N/A 02/19/2018   Procedure: LAPAROSCOPIC SIGMOID COLECTOMY-  POSSIBLE OPEN;  Surgeon: Olean Ree, MD;  Location: [facility] ORS;  Service: General;  Laterality: N/A;   COLONOSCOPY WITH PROPOFOL N/A 07/17/2017   Procedure: COLONOSCOPY WITH PROPOFOL;  Surgeon: Lin Landsman, MD;  Location[facility] ENDOSCOPY;  Service: Gastroenterology;  Laterality: N/A;   COLONOSCOPY WITH PROPOFOL N/A 11/02/2017   Procedure: COLONOSCOPY WITH  PROPOFOL;  Surgeon: Lin Landsman, MD;  Location: [facility] ENDOSCOPY;  Service: Gastroenterology;  Laterality: N/A;   CYSTOCELE REPAIR     CYSTOSCOPY W/ URETERAL STENT PLACEMENT Bilateral 02/19/2018   Procedure: CYSTOSCOPY WITH STENT PLACEMENT GOING 1ST;  Surgeon: Billey Co, MD;  Location: [facility] ORS;  Service: Urology;  Laterality: Bilateral;   FLEXIBLE SIGMOIDOSCOPY N/A 07/31/2017   Procedure: FLEXIBLE SIGMOIDOSCOPY;  Surgeon: Lin Landsman, MD;  Location: [facility] ENDOSCOPY;  Service: Gastroenterology;  Laterality: N/A;   HERNIA REPAIR     MOHS SURGERY N/A 05/2015   Face   PARTIAL COLECTOMY  Nov 2012   left, secondary to diverticular perf   RECTOCELE REPAIR     REVERSE SHOULDER ARTHROPLASTY Right 02/26/2020   Procedure: REVERSE SHOULDER ARTHROPLASTY;  Surgeon: Corky Mull, MD;  Location: [facility] ORS;  Service: Orthopedics;  Laterality: Right;   SEPTOPLASTY  1969   TEE WITHOUT CARDIOVERSION N/A 11/24/2015   Procedure: TRANSESOPHAGEAL ECHOCARDIOGRAM (TEE);  Surgeon: Minna Merritts, MD;  Location: [facility] ORS;  Service: Cardiovascular;  Laterality: N/A;   [place]   right leg     Prior to Admission medications   Medication Sig Start Date End Date Taking? Authorizing Provider  cyclobenzaprine (FLEXERIL) 5 MG tablet Take 1 tablet (5 mg total) by mouth 3 (three) times daily as needed. 06/12/20  Yes Riad Wagley, Dannielle Karvonen, PA-C  naproxen (NAPROSYN) 500 MG tablet Take 1 tablet (500 mg total) by mouth 2 (two) times daily with a meal for 15 days. 06/12/20 06/27/20 Yes Naylah Cork, Dannielle Karvonen, PA-C  amLODipine (NORVASC) 2.5 MG tablet Take 1 tablet (2.5 mg total) by mouth every evening. 06/11/20   Crecencio Mc, MD  Ascorbic Acid (VITAMIN C) 1000 MG tablet Take 1,000 mg by mouth daily.     [provider]  aspirin EC 81 MG tablet Take 81 mg by mouth at bedtime. Swallow whole.    [provider]  Calcium Carbonate-Vitamin D3 600-400  MG-UNIT TABS Take 1 tablet by mouth daily.    [provider]  clobetasol cream (TEMOVATE) 2.77 % Apply 1 application topically 2 (two) times daily as needed (psoriasis). 04/22/19   [provider]  conjugated estrogens (PREMARIN) vaginal cream Place 1 Applicatorful vaginally daily. Patient taking differently: Place 1 Applicatorful vaginally daily as needed (dryness/irritation). 05/08/19   Rubie Maid, MD  Dulaglutide (TRULICITY) 1.5 OE/4.2PN SOPN Inject 1.5 mg into the skin once a week. Patient taking differently: Inject 1.5 mg into the skin every Sunday. 01/23/20   Crecencio Mc, MD  escitalopram (LEXAPRO) 5 MG tablet Take 1 tablet (5 mg total) by mouth every evening. 03/13/20   Crecencio Mc, MD  ezetimibe (ZETIA) 10 MG tablet Take 1 tablet (10 mg total) by mouth daily. 03/13/20   Crecencio Mc, MD  folic acid (FOLVITE) 1 MG tablet Take 1 tablet (1 mg total) by mouth See admin instructions. Takes daily except on Saturday 03/13/20   Crecencio Mc, MD  glipiZIDE (GLUCOTROL XL) 10 MG 24 hr tablet Take 1 tablet (10 mg total) by mouth daily with breakfast.  01/17/20   Crecencio Mc, MD  glucose blood (FREESTYLE LITE) test strip Use as instructed to test blood sugar twice daily 01/17/20   Crecencio Mc, MD  hydrochlorothiazide (HYDRODIURIL) 12.5 MG tablet Take 1 tablet (12.5 mg total) by mouth daily. 12/12/19   Crecencio Mc, MD  loratadine (CLARITIN) 10 MG tablet Take 1 tablet (10 mg total) by mouth daily. 03/13/20   Crecencio Mc, MD  losartan (COZAAR) 100 MG tablet Take 1 tablet (100 mg total) by mouth daily. 03/13/20   Crecencio Mc, MD  metFORMIN (GLUCOPHAGE XR) 750 MG 24 hr tablet Take 2 tablets (1,500 mg total) by mouth daily with breakfast. 04/22/20   Crecencio Mc, MD  methotrexate (RHEUMATREX) 2.5 MG tablet Take 7.5 mg by mouth every Saturday.    [provider]  methotrexate (RHEUMATREX) 2.5 MG tablet Take 3 tablets (7.5 mg total) by mouth once a week.  Caution:Chemotherapy. Protect from light. 04/22/20   Crecencio Mc, MD  montelukast (SINGULAIR) 10 MG tablet Take 1 tablet (10 mg total) by mouth at bedtime. 12/12/19   Crecencio Mc, MD  Multiple Vitamin (MULTIVITAMIN) capsule Take 1 capsule by mouth daily.    [provider]  omeprazole (PRILOSEC) 20 MG capsule Take 1 capsule (20 mg total) by mouth daily. 03/13/20   Crecencio Mc, MD  Precision Thin Lancets MISC Use as directed to check sugars twice daily 01/27/12   Crecencio Mc, MD  simvastatin (ZOCOR) 20 MG tablet Take 1 tablet (20 mg total) by mouth at bedtime. 12/12/19   Crecencio Mc, MD  traMADol (ULTRAM) 50 MG tablet Take 1 tablet (50 mg total) by mouth every 6 (six) hours as needed (Breakthrough pain). 02/26/20 02/25/21  Poggi, Marshall Cork, MD  vitamin E 400 UNIT capsule Take 400 Units by mouth daily.    [provider]    Allergies Prednisone  Family History  Problem Relation Age of Onset   Heart Problems Mother    Macular degeneration Mother    Cancer Father    Cancer Maternal Grandmother        colon ca   Diabetes Sister    Dementia Sister    Diabetes Brother    Stroke Brother    Diabetes Brother    Breast cancer Maternal Aunt    Breast cancer Paternal Aunt    Lung cancer Cousin    Lung disease Neg Hx    Rheumatologic disease Neg Hx     Social History Social History   Tobacco Use   Smoking status: Never   Smokeless tobacco: Never   Tobacco comments:    father growing up  Vaping Use   Vaping Use: Never used  Substance Use Topics   Alcohol use: Yes    Comment: Occasional   Drug use: No    Review of Systems  Constitutional: Negative for fever. Eyes: Negative for visual changes. ENT: Negative for sore throat. Cardiovascular: Negative for chest pain. Respiratory: Negative for shortness of breath. Gastrointestinal: Negative for abdominal pain, vomiting and diarrhea. Genitourinary: Negative for dysuria. Musculoskeletal: Negative for back  pain. Skin: Negative for rash. Neurological: Negative for headaches, focal weakness or numbness. ____________________________________________  PHYSICAL EXAM:  VITAL SIGNS: ED Triage Vitals  Enc Vitals Group     BP 06/12/20 1422 (!) 155/74     Pulse Rate 06/12/20 1422 80     Resp 06/12/20 1422 16     Temp 06/12/20 1422 98.3 F (36.8 C)  Temp src --      SpO2 06/12/20 1422 94 %     Weight 06/12/20 1423 132 lb (59.9 kg)     Height 06/12/20 1423 4\' 11"  (1.499 m)     Head Circumference --      Peak Flow --      Pain Score 06/12/20 1422 8     Pain Loc --      Pain Edu? --      Excl. in [place]? --     Constitutional: Alert and oriented. Well appearing and in no distress. Head: Normocephalic and atraumatic. Eyes: Conjunctivae are normal. Normal extraocular movements Cardiovascular: Normal rate, regular rhythm. Normal distal pulses. Respiratory: Normal respiratory effort. No wheezes/rales/rhonchi. Gastrointestinal: Soft and nontender. No distention. Musculoskeletal: Normal spinal alignment without midline tenderness, spasm, vomiting, or step-off.  Patient is mildly tender to palpation to the right anterior lateral thigh from the groin to the knee.  She also has some tenderness along the IT band.  She is able demonstrate normal flexion extension range of the hip, as well as normal lower leg extension and quadricep muscle activation.  Nontender with normal range of motion in all extremities.  Neurologic: Cranial nerves II to XII grossly intact.  Normal LE DTRs bilaterally.  Normal toe dorsiflexion foot eversion on exam.  Negative supine straight leg raise bilaterally.  Mildly antalgic gait without ataxia. Normal speech and language. No gross focal neurologic deficits are appreciated. Skin:  Skin is warm, dry and intact. No rash noted. ____________________________________________   RADIOLOGY  Venous Doppler RLE  IMPRESSION: No evidence of DVT within the right lower extremity.  CT  Lumbar Spine w/o CM  IMPRESSION: 1. Multilevel degenerative changes of the lumbar spine as described above. Moderate right neuroforaminal stenosis at L5-S1 could affect the exiting right L5 nerve root and explain the patient's pain. Correlate for L5 radiculopathy. 2. Moderate spinal canal stenosis at L3-L4.  CT Pelvis w/o CM  IMPRESSION: 1. No acute abnormality. 2. Mild bilateral hip osteoarthritis. ____________________________________________  PROCEDURES  Toradol 15 mg IM  Procedures ____________________________________________   INITIAL IMPRESSION / ASSESSMENT AND PLAN / ED COURSE  As part of my medical decision making, I reviewed the following data within the [facility] chart reviewed, Radiograph reviewed as above, Notes from prior ED visits, and [place] Controlled Substance Database   DDX: DVT, ITB syndrome, myalgia, radiculopathy  Patient ED evaluation of acute right-sided hip and leg pain and weakness.  Patient was evaluated for complaints, with initial subjective concerns for DVT.  Patient DVT study was negative at this time and reassuring.  Clinically the patient appeared to have a likely musculoskeletal versus discogenic etiology.  She was further evaluated by CT imaging of the lumbar spine and pelvis, which did reveal significant multilevel DDD with some concern for possible nerve impingement in the L5-S1 nerve root.  Patient otherwise was stable at this time without signs of any spinal cord compression, cauda equina syndrome, or other red flags.  She is overall reassured by the work-up at this time, and is inclined to treat with muscle relaxants and Lidoderm patches.  Patient is referred to her PCP and/or her general orthopedic provider for further evaluation management of her symptoms.  Return precautions have been discussed.    [name] was evaluated in Emergency Department on 06/13/2020 for the symptoms described in the history of present illness.  She was evaluated in the context of the global COVID-19 pandemic, which necessitated consideration that the  patient might be at risk for infection with the SARS-CoV-2 virus that causes COVID-19. Institutional protocols and algorithms that pertain to the evaluation of patients at risk for COVID-19 are in a state of rapid change based on information released by regulatory bodies including the CDC and federal and state organizations. These policies and algorithms were followed during the patient's care in the ED. ____________________________________________  FINAL CLINICAL IMPRESSION(S) / ED DIAGNOSES  Final diagnoses:  Radicular pain of right lower extremity   s   Melvenia Needles, PA-C 06/13/20 0009    Arta Silence, MD 06/13/20 0013

## 2020-06-12 NOTE — Discharge Instructions (Addendum)
Your x-ray is normal.  Take Tylenol or ibuprofen as needed for discomfort.  Follow-up with your primary care provider or an orthopedist if your symptoms are not improving.    Your blood pressure is elevated today at 163/81.  Please have this rechecked by your primary care provider in 2-4 weeks.

## 2020-06-12 NOTE — Telephone Encounter (Signed)
She needs to be seen today in person for this pain. If there are no available appointments in the office you could try another office or she can go to urgent care.

## 2020-06-12 NOTE — ED Triage Notes (Signed)
Pt reports having R leg pain x2 days. No known injury to leg. Pain is from hip to knee.

## 2020-06-15 DIAGNOSIS — M25551 Pain in right hip: Secondary | ICD-10-CM | POA: Diagnosis not present

## 2020-06-15 DIAGNOSIS — M1611 Unilateral primary osteoarthritis, right hip: Secondary | ICD-10-CM | POA: Diagnosis not present

## 2020-06-25 ENCOUNTER — Telehealth: Payer: Self-pay | Admitting: Internal Medicine

## 2020-06-25 DIAGNOSIS — N390 Urinary tract infection, site not specified: Secondary | ICD-10-CM

## 2020-06-25 NOTE — Telephone Encounter (Signed)
Transfer to Access Nurse  PT called to advise that they might have a UTI and are experiencing burning,discomfort and pain when urinating. Transfer to Access Nurse

## 2020-06-26 NOTE — Telephone Encounter (Signed)
Tried to reach patient concerning UTI symptoms no answer left voicemail to return my call.

## 2020-06-29 NOTE — Telephone Encounter (Signed)
Seen at [facility].

## 2020-07-02 ENCOUNTER — Other Ambulatory Visit: Payer: Self-pay

## 2020-07-02 ENCOUNTER — Other Ambulatory Visit (INDEPENDENT_AMBULATORY_CARE_PROVIDER_SITE_OTHER): Payer: Medicare Other

## 2020-07-02 DIAGNOSIS — N390 Urinary tract infection, site not specified: Secondary | ICD-10-CM

## 2020-07-02 LAB — URINALYSIS, ROUTINE W REFLEX MICROSCOPIC
Bilirubin Urine: NEGATIVE
Ketones, ur: NEGATIVE
Nitrite: NEGATIVE
Specific Gravity, Urine: <=1.005 — AB (ref 1.000–1.030)
Total Protein, Urine: NEGATIVE
Urine Glucose: >=1000 — AB
Urobilinogen, UA: 0.2 (ref 0.0–1.0)
pH: 6.5 (ref 5.0–8.0)

## 2020-07-02 NOTE — Telephone Encounter (Signed)
Spoke with pt and she stated that last week she had a UTI and was treated with antibiotics. She stated that it got better but this morning she woke up again with burning and blood in her urine. Would you like for pt to come by office and give a urine sample?

## 2020-07-02 NOTE — Telephone Encounter (Signed)
PT called in from Access Nurse who advise her that she needs to be seen in the next 4 hours for blood in her urine. We currently do not have anything with any Dr in the next 4 hours. Please advise and also the PT was even wondering if they could just come for labs in regards to blood in urine.

## 2020-07-02 NOTE — Telephone Encounter (Signed)
Yes  Labs ordered

## 2020-07-02 NOTE — Addendum Note (Signed)
Addended by: Crecencio Mc on: 07/02/2020 10:06 AM   Modules accepted: Orders

## 2020-07-02 NOTE — Telephone Encounter (Signed)
Spoke with pt and scheduled her for a lab appt to collect a urine sample.  

## 2020-07-03 MED ORDER — CIPROFLOXACIN HCL 250 MG PO TABS: 250.0000 mg | ORAL_TABLET | Freq: Two times a day (BID) | ORAL | 0 refills | Status: AC

## 2020-07-04 LAB — URINE CULTURE
MICRO NUMBER:: 12070089
SPECIMEN QUALITY:: ADEQUATE

## 2020-07-20 ENCOUNTER — Other Ambulatory Visit: Payer: Medicare Other

## 2020-07-22 ENCOUNTER — Ambulatory Visit: Payer: Medicare Other | Admitting: Internal Medicine

## 2020-07-27 ENCOUNTER — Other Ambulatory Visit: Payer: Medicare Other

## 2020-07-28 ENCOUNTER — Other Ambulatory Visit: Payer: Medicare Other

## 2020-07-29 ENCOUNTER — Other Ambulatory Visit: Payer: Self-pay

## 2020-07-29 ENCOUNTER — Other Ambulatory Visit (INDEPENDENT_AMBULATORY_CARE_PROVIDER_SITE_OTHER): Payer: Medicare Other

## 2020-07-29 DIAGNOSIS — E1165 Type 2 diabetes mellitus with hyperglycemia: Secondary | ICD-10-CM

## 2020-07-29 DIAGNOSIS — E559 Vitamin D deficiency, unspecified: Secondary | ICD-10-CM

## 2020-07-29 DIAGNOSIS — E538 Deficiency of other specified B group vitamins: Secondary | ICD-10-CM | POA: Diagnosis not present

## 2020-07-29 LAB — LIPID PANEL
Cholesterol: 161 mg/dL (ref 0–200)
HDL: 47.2 mg/dL (ref >39.00)
LDL Cholesterol: 80 mg/dL (ref 0–99)
NonHDL: 113.57
Total CHOL/HDL Ratio: 3
Triglycerides: 168 mg/dL — ABNORMAL HIGH (ref 0.0–149.0)
VLDL: 33.6 mg/dL (ref 0.0–40.0)

## 2020-07-29 LAB — CBC WITH DIFFERENTIAL/PLATELET
Basophils Absolute: 0 10*3/uL (ref 0.0–0.1)
Basophils Relative: 0.3 % (ref 0.0–3.0)
Eosinophils Absolute: 0.1 10*3/uL (ref 0.0–0.7)
Eosinophils Relative: 1.2 % (ref 0.0–5.0)
HCT: 40.2 % (ref 36.0–46.0)
Hemoglobin: 13.6 g/dL (ref 12.0–15.0)
Lymphocytes Relative: 30.7 % (ref 12.0–46.0)
Lymphs Abs: 1.8 10*3/uL (ref 0.7–4.0)
MCHC: 33.9 g/dL (ref 30.0–36.0)
MCV: 98.8 fl (ref 78.0–100.0)
Monocytes Absolute: 0.8 10*3/uL (ref 0.1–1.0)
Monocytes Relative: 13.1 % — ABNORMAL HIGH (ref 3.0–12.0)
Neutro Abs: 3.3 10*3/uL (ref 1.4–7.7)
Neutrophils Relative %: 54.7 % (ref 43.0–77.0)
Platelets: 290 10*3/uL (ref 150.0–400.0)
RBC: 4.07 Mil/uL (ref 3.87–5.11)
RDW: 14.8 % (ref 11.5–15.5)
WBC: 6 10*3/uL (ref 4.0–10.5)

## 2020-07-29 LAB — COMPREHENSIVE METABOLIC PANEL
ALT: 36 U/L — ABNORMAL HIGH (ref 0–35)
AST: 24 U/L (ref 0–37)
Albumin: 4 g/dL (ref 3.5–5.2)
Alkaline Phosphatase: 86 U/L (ref 39–117)
BUN: 12 mg/dL (ref 6–23)
CO2: 29 mEq/L (ref 19–32)
Calcium: 9.1 mg/dL (ref 8.4–10.5)
Chloride: 101 mEq/L (ref 96–112)
Creatinine, Ser: 0.61 mg/dL (ref 0.40–1.20)
GFR: 87.25 mL/min (ref >60.00)
Glucose, Bld: 136 mg/dL — ABNORMAL HIGH (ref 70–99)
Potassium: 4.1 mEq/L (ref 3.5–5.1)
Sodium: 139 mEq/L (ref 135–145)
Total Bilirubin: 0.5 mg/dL (ref 0.2–1.2)
Total Protein: 6.8 g/dL (ref 6.0–8.3)

## 2020-07-29 LAB — MICROALBUMIN / CREATININE URINE RATIO
Creatinine,U: 238.4 mg/dL
Microalb Creat Ratio: 1.5 mg/g (ref 0.0–30.0)
Microalb, Ur: 3.6 mg/dL — ABNORMAL HIGH (ref 0.0–1.9)

## 2020-07-29 LAB — HEMOGLOBIN A1C: Hgb A1c MFr Bld: 8 % — ABNORMAL HIGH (ref 4.6–6.5)

## 2020-07-29 LAB — VITAMIN B12: Vitamin B-12: >1550 pg/mL — ABNORMAL HIGH (ref 211–911)

## 2020-07-29 LAB — VITAMIN D 25 HYDROXY (VIT D DEFICIENCY, FRACTURES): VITD: 35.87 ng/mL (ref 30.00–100.00)

## 2020-07-30 ENCOUNTER — Other Ambulatory Visit: Payer: Self-pay

## 2020-07-30 ENCOUNTER — Encounter: Payer: Self-pay | Admitting: Internal Medicine

## 2020-07-30 ENCOUNTER — Ambulatory Visit (INDEPENDENT_AMBULATORY_CARE_PROVIDER_SITE_OTHER): Payer: Medicare Other | Admitting: Internal Medicine

## 2020-07-30 VITALS — BP 142/68 | HR 83 | Temp 96.8°F | Resp 15 | Ht 59.0 in | Wt 132.4 lb

## 2020-07-30 DIAGNOSIS — Z780 Asymptomatic menopausal state: Secondary | ICD-10-CM | POA: Diagnosis not present

## 2020-07-30 DIAGNOSIS — M51369 Other intervertebral disc degeneration, lumbar region without mention of lumbar back pain or lower extremity pain: Secondary | ICD-10-CM

## 2020-07-30 DIAGNOSIS — Z1231 Encounter for screening mammogram for malignant neoplasm of breast: Secondary | ICD-10-CM

## 2020-07-30 DIAGNOSIS — I10 Essential (primary) hypertension: Secondary | ICD-10-CM

## 2020-07-30 DIAGNOSIS — I700 Atherosclerosis of aorta: Secondary | ICD-10-CM | POA: Diagnosis not present

## 2020-07-30 DIAGNOSIS — E782 Mixed hyperlipidemia: Secondary | ICD-10-CM

## 2020-07-30 DIAGNOSIS — M5136 Other intervertebral disc degeneration, lumbar region: Secondary | ICD-10-CM

## 2020-07-30 DIAGNOSIS — E1169 Type 2 diabetes mellitus with other specified complication: Secondary | ICD-10-CM

## 2020-07-30 DIAGNOSIS — E785 Hyperlipidemia, unspecified: Secondary | ICD-10-CM

## 2020-07-30 MED ORDER — EZETIMIBE 10 MG PO TABS: 10.0000 mg | ORAL_TABLET | Freq: Every day | ORAL | 3 refills | Status: DC

## 2020-07-30 MED ORDER — ESCITALOPRAM OXALATE 5 MG PO TABS: 5.0000 mg | ORAL_TABLET | Freq: Every evening | ORAL | 3 refills | Status: DC

## 2020-07-30 MED ORDER — TELMISARTAN 80 MG PO TABS: 80.0000 mg | ORAL_TABLET | Freq: Every day | ORAL | 1 refills | Status: DC

## 2020-07-30 MED ORDER — LORATADINE 10 MG PO TABS: 10.0000 mg | ORAL_TABLET | Freq: Every day | ORAL | 3 refills | Status: DC

## 2020-07-30 MED ORDER — OMEPRAZOLE 20 MG PO CPDR: 20.0000 mg | DELAYED_RELEASE_CAPSULE | Freq: Every day | ORAL | 3 refills | Status: DC

## 2020-07-30 MED ORDER — CYCLOBENZAPRINE HCL 5 MG PO TABS: 5.0000 mg | ORAL_TABLET | Freq: Three times a day (TID) | ORAL | 0 refills | Status: DC | PRN

## 2020-07-30 MED ORDER — GLIPIZIDE ER 10 MG PO TB24: 10.0000 mg | ORAL_TABLET | Freq: Every day | ORAL | 3 refills | Status: DC

## 2020-07-30 NOTE — Progress Notes (Signed)
Subjective:  Patient ID: [name], female    DOB: 14-Feb-1945  Age: 75 y.o. MRN: [id number]  CC: The primary encounter diagnosis was Breast cancer screening by mammogram. Diagnoses of Hyperlipidemia associated with type 2 diabetes mellitus ([place]), Postmenopausal estrogen deficiency, DM type 2 with diabetic mixed hyperlipidemia ([place]), White coat syndrome with diagnosis of hypertension, Atherosclerosis of aorta ([place]), Mixed hyperlipidemia, and Lumbar degenerative disc disease were also pertinent to this visit.  HPI MARLOW BOWE presents for follow up on type 2 DM and other issues   This visit occurred during the SARS-CoV-2 public health emergency.  Safety protocols were in place, including screening questions prior to the visit, additional usage of staff PPE, and extensive cleaning of exam room while observing appropriate contact time as indicated for disinfecting solutions.   Type 2 DM:  Fastings have been around 150;   post prandials checked rarely . Using glipizide ,  trulicity and metformin.  Diet reviewed:  eats pasta with homemade sauce several times per week, eggplant parmigiana, but  not breaded .  Eats Ice cream 3 times per week. .  not going to the gym any more for [employer] program due to right hip trochanteric bursitis that was treated with a steroid shot last month. Also went on an [place] cruise during last 3 months . No neuropathy symptoms. Up to date on eye exam .  HTN: Patient is taking her medications as prescribed and notes no adverse effects.  Home BP readings have been done about once per week and are  generally > 140/80 .  She is avoiding added salt in her diet .    Outpatient Medications Prior to Visit  Medication Sig Dispense Refill   amLODipine (NORVASC) 2.5 MG tablet Take 1 tablet (2.5 mg total) by mouth every evening. 90 tablet 1   Ascorbic Acid (VITAMIN C) 1000 MG tablet Take 1,000 mg by mouth daily.      aspirin EC 81 MG tablet Take 81 mg by mouth at  bedtime. Swallow whole.     Calcium Carbonate-Vitamin D3 600-400 MG-UNIT TABS Take 1 tablet by mouth daily.     clobetasol cream (TEMOVATE) [id number] % Apply 1 application topically 2 (two) times daily as needed (psoriasis).     conjugated estrogens (PREMARIN) vaginal cream Place 1 Applicatorful vaginally daily. (Patient taking differently: Place 1 Applicatorful vaginally daily as needed (dryness/irritation).) 42.5 g 3   Dulaglutide (TRULICITY) 1.5 0000000 SOPN Inject 1.5 mg into the skin once a week. (Patient taking differently: Inject 1.5 mg into the skin every Sunday.) 6 mL 3   folic acid (FOLVITE) 1 MG tablet Take 1 tablet (1 mg total) by mouth See admin instructions. Takes daily except on Saturday 90 tablet 3   glucose blood (FREESTYLE LITE) test strip Use as instructed to test blood sugar twice daily 200 each 3   hydrochlorothiazide (HYDRODIURIL) 12.5 MG tablet Take 1 tablet (12.5 mg total) by mouth daily. 90 tablet 3   metFORMIN (GLUCOPHAGE XR) 750 MG 24 hr tablet Take 2 tablets (1,500 mg total) by mouth daily with breakfast. 180 tablet 1   methotrexate (RHEUMATREX) 2.5 MG tablet Take 7.5 mg by mouth every Saturday.     methotrexate (RHEUMATREX) 2.5 MG tablet Take 3 tablets (7.5 mg total) by mouth once a week. Caution:Chemotherapy. Protect from light. 36 tablet 1   montelukast (SINGULAIR) 10 MG tablet Take 1 tablet (10 mg total) by mouth at bedtime. 90 tablet 3   Multiple Vitamin (MULTIVITAMIN)  capsule Take 1 capsule by mouth daily.     Precision Thin Lancets MISC Use as directed to check sugars twice daily 180 each 3   simvastatin (ZOCOR) 20 MG tablet Take 1 tablet (20 mg total) by mouth at bedtime. 90 tablet 3   vitamin E 400 UNIT capsule Take 400 Units by mouth daily.     escitalopram (LEXAPRO) 5 MG tablet Take 1 tablet (5 mg total) by mouth every evening. 90 tablet 1   ezetimibe (ZETIA) 10 MG tablet Take 1 tablet (10 mg total) by mouth daily. 90 tablet 1   glipiZIDE (GLUCOTROL XL) 10 MG 24  hr tablet Take 1 tablet (10 mg total) by mouth daily with breakfast. 90 tablet 2   loratadine (CLARITIN) 10 MG tablet Take 1 tablet (10 mg total) by mouth daily. 90 tablet 1   losartan (COZAAR) 100 MG tablet Take 1 tablet (100 mg total) by mouth daily. 90 tablet 1   omeprazole (PRILOSEC) 20 MG capsule Take 1 capsule (20 mg total) by mouth daily. 90 capsule 1   traMADol (ULTRAM) 50 MG tablet Take 1 tablet (50 mg total) by mouth every 6 (six) hours as needed (Breakthrough pain). (Patient not taking: Reported on 07/30/2020) 30 tablet 0   cyclobenzaprine (FLEXERIL) 5 MG tablet Take 1 tablet (5 mg total) by mouth 3 (three) times daily as needed. (Patient not taking: Reported on 07/30/2020) 15 tablet 0   No facility-administered medications prior to visit.    Review of Systems;  Patient denies headache, fevers, malaise, unintentional weight loss, skin rash, eye pain, sinus congestion and sinus pain, sore throat, dysphagia,  hemoptysis , cough, dyspnea, wheezing, chest pain, palpitations, orthopnea, edema, abdominal pain, nausea, melena, diarrhea, constipation, flank pain, dysuria, hematuria, urinary  Frequency, nocturia, numbness, tingling, seizures,  Focal weakness, Loss of consciousness,  Tremor, insomnia, depression, anxiety, and suicidal ideation.      Objective:  BP (!) 142/68 (BP Location: Left Arm, Patient Position: Sitting, Cuff Size: Normal)   Pulse 83   Temp (!) 96.8 F (36 C) (Temporal)   Resp 15   Ht '4\' 11"'$  (1.499 m)   Wt 132 lb 6.4 oz (60.1 kg)   SpO2 96%   BMI 26.74 kg/m   BP Readings from Last 3 Encounters:  07/30/20 (!) 142/68  06/12/20 (!) 150/70  06/12/20 (!) 163/81    Wt Readings from Last 3 Encounters:  07/30/20 132 lb 6.4 oz (60.1 kg)  06/12/20 132 lb (59.9 kg)  06/12/20 132 lb (59.9 kg)    General appearance: alert, cooperative and appears stated age Ears: normal TM's and external ear canals both ears Throat: lips, mucosa, and tongue normal; teeth and gums  normal Neck: no adenopathy, no carotid bruit, supple, symmetrical, trachea midline and thyroid not enlarged, symmetric, no tenderness/mass/nodules Back: symmetric, no curvature. ROM normal. No CVA tenderness. Lungs: clear to auscultation bilaterally Heart: regular rate and rhythm, S1, S2 normal, no murmur, click, rub or gallop Abdomen: soft, non-tender; bowel sounds normal; no masses,  no organomegaly Pulses: 2+ and symmetric Skin: Skin color, texture, turgor normal. No rashes or lesions Lymph nodes: Cervical, supraclavicular, and axillary nodes normal.  Lab Results  Component Value Date   HGBA1C 8.0 (H) 07/29/2020   HGBA1C 7.5 (H) 04/16/2020   HGBA1C 7.8 (H) 01/14/2020    Lab Results  Component Value Date   CREATININE 0.61 07/29/2020   CREATININE 0.62 05/27/2020   CREATININE 0.63 04/16/2020    Lab Results  Component Value Date  WBC 6.0 07/29/2020   HGB 13.6 07/29/2020   HCT 40.2 07/29/2020   PLT 290.0 07/29/2020   GLUCOSE 136 (H) 07/29/2020   CHOL 161 07/29/2020   TRIG 168.0 (H) 07/29/2020   HDL 47.20 07/29/2020   LDLDIRECT 72.0 04/12/2018   LDLCALC 80 07/29/2020   ALT 36 (H) 07/29/2020   AST 24 07/29/2020   NA 139 07/29/2020   K 4.1 07/29/2020   CL 101 07/29/2020   CREATININE 0.61 07/29/2020   BUN 12 07/29/2020   CO2 29 07/29/2020   TSH 3.11 01/14/2020   HGBA1C 8.0 (H) 07/29/2020   MICROALBUR 3.6 (H) 07/29/2020    CT Lumbar Spine Wo Contrast  Result Date: 06/12/2020 CLINICAL DATA:  Right thigh pain for the past 2-3 days.  No injury. EXAM: CT LUMBAR SPINE WITHOUT CONTRAST TECHNIQUE: Multidetector CT imaging of the lumbar spine was performed without intravenous contrast administration. Multiplanar CT image reconstructions were also generated. COMPARISON:  CT abdomen pelvis dated September 10, 2010. FINDINGS: Segmentation: 5 lumbar type vertebrae. Alignment: 4 mm anterolisthesis at L4-L5 Vertebrae: No acute fracture or focal pathologic process. Paraspinal and other  soft tissues: Negative. Disc levels: T12-L1:  Negative. L1-L2: Mild disc bulging with superimposed small left foraminal disc protrusion. Mild left neuroforaminal stenosis. No spinal canal or right neuroforaminal stenosis. L2-L3: Moderate disc bulging. Mild spinal canal and bilateral neuroforaminal stenosis. L3-L4: Mild disc bulging and bilateral facet arthropathy with prominent ligamentum flavum hypertrophy. Moderate spinal canal stenosis. Mild right neuroforaminal stenosis. No left neuroforaminal stenosis. L4-L5: Disc uncovering and mild disc bulging with superimposed broad-based left subarticular and foraminal disc protrusion. Severe bilateral facet arthropathy. Mild spinal canal stenosis. Mild left neuroforaminal stenosis. No right neuroforaminal stenosis. L5-S1: Moderate disc bulging. Moderate right and mild left facet arthropathy. Moderate right and mild left neuroforaminal stenosis. No spinal canal stenosis. IMPRESSION: 1. Multilevel degenerative changes of the lumbar spine as described above. Moderate right neuroforaminal stenosis at L5-S1 could affect the exiting right L5 nerve root and explain the patient's pain. Correlate for L5 radiculopathy. 2. Moderate spinal canal stenosis at L3-L4. Electronically Signed   By: Titus Dubin M.D.   On: 06/12/2020 19:14   CT PELVIS WO CONTRAST  Result Date: 06/12/2020 CLINICAL DATA:  Right thigh pain for the past 2-3 days.  No injury. EXAM: CT PELVIS WITHOUT CONTRAST TECHNIQUE: Multidetector CT imaging of the pelvis was performed following the standard protocol without intravenous contrast. COMPARISON:  CT virtual colonoscopy dated August 15, 2013. FINDINGS: Urinary Tract:  No abnormality visualized. Bowel: Prior partial sigmoid colon resection. Otherwise unremarkable. Vascular/Lymphatic: No pathologically enlarged lymph nodes. No significant vascular abnormality seen. Reproductive:  Prior hysterectomy.  No adnexal mass. Other:  None. Musculoskeletal: Mild  bilateral hip osteoarthritis with chondrocalcinosis. IMPRESSION: 1. No acute abnormality. 2. Mild bilateral hip osteoarthritis. Electronically Signed   By: Titus Dubin M.D.   On: 06/12/2020 19:06   US Venous Img Lower Unilateral Right  Result Date: 06/12/2020 CLINICAL DATA:  Right lower extremity pain the past 3 days. Evaluate for DVT. EXAM: RIGHT LOWER EXTREMITY VENOUS DOPPLER ULTRASOUND TECHNIQUE: Gray-scale sonography with graded compression, as well as color Doppler and duplex ultrasound were performed to evaluate the lower extremity deep venous systems from the level of the common femoral vein and including the common femoral, femoral, profunda femoral, popliteal and calf veins including the posterior tibial, peroneal and gastrocnemius veins when visible. The superficial great saphenous vein was also interrogated. Spectral Doppler was utilized to evaluate flow at rest and with distal  augmentation maneuvers in the common femoral, femoral and popliteal veins. COMPARISON:  None. FINDINGS: Contralateral Common Femoral Vein: Respiratory phasicity is normal and symmetric with the symptomatic side. No evidence of thrombus. Normal compressibility. Common Femoral Vein: No evidence of thrombus. Normal compressibility, respiratory phasicity and response to augmentation. Saphenofemoral Junction: No evidence of thrombus. Normal compressibility and flow on color Doppler imaging. Profunda Femoral Vein: No evidence of thrombus. Normal compressibility and flow on color Doppler imaging. Femoral Vein: No evidence of thrombus. Normal compressibility, respiratory phasicity and response to augmentation. Popliteal Vein: No evidence of thrombus. Normal compressibility, respiratory phasicity and response to augmentation. Calf Veins: No evidence of thrombus. Normal compressibility and flow on color Doppler imaging. Superficial Great Saphenous Vein: No evidence of thrombus. Normal compressibility. Venous Reflux:  None. Other  Findings:  None. IMPRESSION: No evidence of DVT within the right lower extremity. Electronically Signed   By: Sandi Mariscal M.D.   On: 06/12/2020 16:43   DG Femur Min 2 Views Right  Result Date: 06/12/2020 CLINICAL DATA:  RIGHT hip and upper leg pain for 2 days, constant pain increased with weight-bearing, pain radiates from leg up to hip, no injury EXAM: RIGHT FEMUR 2 VIEWS COMPARISON:  None FINDINGS: Osseous demineralization. Joint space narrowing at RIGHT knee. LEFT hip joint space preserved. No definite fracture, dislocation, or bone destruction. Scattered atherosclerotic calcifications. IMPRESSION: Osseous demineralization with degenerative changes RIGHT knee. No acute abnormalities. Electronically Signed   By: Lavonia Dana M.D.   On: 06/12/2020 12:06    Assessment & Plan:   Problem List Items Addressed This Visit       Unprioritized   White coat syndrome with diagnosis of hypertension    Home readings are not at goal either.  Changing losartan to telmisartan for improved control.  Continue amlodipine and hctz  Lab Results  Component Value Date   CREATININE 0.61 07/29/2020   Lab Results  Component Value Date   NA 139 07/29/2020   K 4.1 07/29/2020   CL 101 07/29/2020   CO2 29 07/29/2020         Relevant Medications   telmisartan (MICARDIS) 80 MG tablet   ezetimibe (ZETIA) 10 MG tablet   Hyperlipidemia associated with type 2 diabetes mellitus (HCC)   Relevant Medications   telmisartan (MICARDIS) 80 MG tablet   ezetimibe (ZETIA) 10 MG tablet   glipiZIDE (GLUCOTROL XL) 10 MG 24 hr tablet   Other Relevant Orders   Hemoglobin A1c   Comprehensive metabolic panel   Lipid panel   DM type 2 with diabetic mixed hyperlipidemia ([place])    She has lost glycemic control and has gained weight. She is on maximal doses of Glipizide, metformin and Trulicity . Dietary indulgences and  lack of exercise contributing  Discussed plan to  change trulicity to ozempic  In 90 days since she just  received trulicity refills. CCM referral made for pharmacy assistance in management of diabetes       Relevant Medications   telmisartan (MICARDIS) 80 MG tablet   ezetimibe (ZETIA) 10 MG tablet   glipiZIDE (GLUCOTROL XL) 10 MG 24 hr tablet   Other Relevant Orders   AMB Referral to [place]   Postmenopausal estrogen deficiency   Atherosclerosis of aorta [facility])    Reviewed findings of prior CT scan today..  Patient is taking simvastatin and LDL is < 100  Lab Results  Component Value Date   CHOL 161 07/29/2020   HDL 47.20 07/29/2020   LDLCALC 80 07/29/2020  LDLDIRECT 72.0 04/12/2018   TRIG 168.0 (H) 07/29/2020   CHOLHDL 3 07/29/2020         Relevant Medications   telmisartan (MICARDIS) 80 MG tablet   ezetimibe (ZETIA) 10 MG tablet   Hyperlipidemia    LDL is < 100 and triglycerides are at goal on simvastatin/zetia . She has no side effects and ALT has  Again risen due to loss of glycemic control . No changes today. Continue aspirin low dose.  Lab Results  Component Value Date   CHOL 161 07/29/2020   HDL 47.20 07/29/2020   LDLCALC 80 07/29/2020   LDLDIRECT 72.0 04/12/2018   TRIG 168.0 (H) 07/29/2020   CHOLHDL 3 07/29/2020   Lab Results  Component Value Date   ALT 36 (H) 07/29/2020   AST 24 07/29/2020   ALKPHOS 86 07/29/2020   BILITOT 0.5 07/29/2020          Relevant Medications   telmisartan (MICARDIS) 80 MG tablet   ezetimibe (ZETIA) 10 MG tablet   Lumbar degenerative disc disease    She has had several ER visits and orthopedic evaluations to manage right thigh pain .  Her pain has resulted in lack of exercise.  Encouraged to walk daily        Relevant Medications   cyclobenzaprine (FLEXERIL) 5 MG tablet   Other Visit Diagnoses     Breast cancer screening by mammogram    -  Primary   Relevant Orders   MM DIGITAL SCREENING BILATERAL       I have discontinued [name] A. Sprinkle's losartan. I am also having her start on telmisartan.  Additionally, I am having her maintain her vitamin E, multivitamin, methotrexate, Precision Thin Lancets, vitamin C, clobetasol cream, Premarin, simvastatin, montelukast, hydrochlorothiazide, FREESTYLE LITE, Trulicity, aspirin EC, Calcium Carbonate-Vitamin D3, traMADol, folic acid, metFORMIN, methotrexate, amLODipine, cyclobenzaprine, escitalopram, ezetimibe, glipiZIDE, loratadine, and omeprazole.  Meds ordered this encounter  Medications   DISCONTD: ezetimibe (ZETIA) 10 MG tablet    Sig: Take 1 tablet (10 mg total) by mouth daily.    Dispense:  90 tablet    Refill:  3   DISCONTD: omeprazole (PRILOSEC) 20 MG capsule    Sig: Take 1 capsule (20 mg total) by mouth daily.    Dispense:  90 capsule    Refill:  3   DISCONTD: loratadine (CLARITIN) 10 MG tablet    Sig: Take 1 tablet (10 mg total) by mouth daily.    Dispense:  90 tablet    Refill:  3   DISCONTD: escitalopram (LEXAPRO) 5 MG tablet    Sig: Take 1 tablet (5 mg total) by mouth every evening.    Dispense:  90 tablet    Refill:  3   DISCONTD: glipiZIDE (GLUCOTROL XL) 10 MG 24 hr tablet    Sig: Take 1 tablet (10 mg total) by mouth daily with breakfast.    Dispense:  90 tablet    Refill:  3    NOTE DOSE INCREASE   telmisartan (MICARDIS) 80 MG tablet    Sig: Take 1 tablet (80 mg total) by mouth daily.    Dispense:  90 tablet    Refill:  1    REPLACES LOSARTAN FOR NEXT REFILL   cyclobenzaprine (FLEXERIL) 5 MG tablet    Sig: Take 1 tablet (5 mg total) by mouth 3 (three) times daily as needed.    Dispense:  90 tablet    Refill:  0   escitalopram (LEXAPRO) 5 MG tablet  Sig: Take 1 tablet (5 mg total) by mouth every evening.    Dispense:  90 tablet    Refill:  3   ezetimibe (ZETIA) 10 MG tablet    Sig: Take 1 tablet (10 mg total) by mouth daily.    Dispense:  90 tablet    Refill:  3   glipiZIDE (GLUCOTROL XL) 10 MG 24 hr tablet    Sig: Take 1 tablet (10 mg total) by mouth daily with breakfast.    Dispense:  90 tablet     Refill:  3   loratadine (CLARITIN) 10 MG tablet    Sig: Take 1 tablet (10 mg total) by mouth daily.    Dispense:  90 tablet    Refill:  3   omeprazole (PRILOSEC) 20 MG capsule    Sig: Take 1 capsule (20 mg total) by mouth daily.    Dispense:  90 capsule    Refill:  3    Medications Discontinued During This Encounter  Medication Reason   glipiZIDE (GLUCOTROL XL) 10 MG 24 hr tablet Reorder   omeprazole (PRILOSEC) 20 MG capsule Reorder   ezetimibe (ZETIA) 10 MG tablet Reorder   loratadine (CLARITIN) 10 MG tablet Reorder   escitalopram (LEXAPRO) 5 MG tablet Reorder   losartan (COZAAR) 100 MG tablet    cyclobenzaprine (FLEXERIL) 5 MG tablet Reorder   ezetimibe (ZETIA) 10 MG tablet Reorder   omeprazole (PRILOSEC) 20 MG capsule Reorder   loratadine (CLARITIN) 10 MG tablet Reorder   escitalopram (LEXAPRO) 5 MG tablet Reorder   glipiZIDE (GLUCOTROL XL) 10 MG 24 hr tablet Reorder    I provided  30 minutes  during this encounter reviewing patient's recent CT scans of lower spine,  hip and leg ,  discussing patient's loss of glycemic control ,  elevated blood pressure ,  providing counseling on the above mentioned problems In a face to face , visit  , and coordination  of care .   Follow-up: Return in about 3 months (around 10/30/2020) for follow up diabetes.   Crecencio Mc, MD

## 2020-07-30 NOTE — Patient Instructions (Signed)
Check your blood sugars 2 hours after your evening meal for the next 2 weeks and send me those readings ("Post prandials")   Reduce your portion size of spaghetti or start using spaghetti squash   Carb Smart icecream bars are 5 net carbs   YOU MUST START WALKING DAILY    Changing losartan to telmisartan for better blood pressure control  Continue trulicity but plan to transition to Ozempic (Catie will help)     I am letting you know that I am referring to our clinical pharmacist ,  [name].  Catie helps me provide additional services to my patients who are on Medicare and dealing with  chronic diseases , like diabetes.  I do not expect this referral to cost you anything out of pocket, but I do think Catie will be able to help you get your diabetes under control and maximize your drug benefits.  She will make contact with  You by phone in the next week or so

## 2020-07-31 DIAGNOSIS — M5136 Other intervertebral disc degeneration, lumbar region: Secondary | ICD-10-CM | POA: Insufficient documentation

## 2020-07-31 DIAGNOSIS — M47816 Spondylosis without myelopathy or radiculopathy, lumbar region: Secondary | ICD-10-CM | POA: Insufficient documentation

## 2020-07-31 NOTE — Assessment & Plan Note (Addendum)
She has had several ER visits and orthopedic evaluations to manage right thigh pain .  Her pain has resulted in lack of exercise.  Encouraged to walk daily

## 2020-07-31 NOTE — Assessment & Plan Note (Signed)
She has lost glycemic control and has gained weight. She is on maximal doses of Glipizide, metformin and Trulicity . Dietary indulgences and  lack of exercise contributing  Discussed plan to  change trulicity to ozempic  In 90 days since she just received trulicity refills. CCM referral made for pharmacy assistance in management of diabetes

## 2020-07-31 NOTE — Assessment & Plan Note (Signed)
LDL is < 100 and triglycerides are at goal on simvastatin/zetia . She has no side effects and ALT has  Again risen due to loss of glycemic control . No changes today. Continue aspirin low dose.  Lab Results  Component Value Date   CHOL 161 07/29/2020   HDL 47.20 07/29/2020   LDLCALC 80 07/29/2020   LDLDIRECT 72.0 04/12/2018   TRIG 168.0 (H) 07/29/2020   CHOLHDL 3 07/29/2020   Lab Results  Component Value Date   ALT 36 (H) 07/29/2020   AST 24 07/29/2020   ALKPHOS 86 07/29/2020   BILITOT 0.5 07/29/2020

## 2020-07-31 NOTE — Assessment & Plan Note (Signed)
Reviewed findings of prior CT scan today..  Patient is taking simvastatin and LDL is < 100  Lab Results  Component Value Date   CHOL 161 07/29/2020   HDL 47.20 07/29/2020   LDLCALC 80 07/29/2020   LDLDIRECT 72.0 04/12/2018   TRIG 168.0 (H) 07/29/2020   CHOLHDL 3 07/29/2020

## 2020-07-31 NOTE — Assessment & Plan Note (Addendum)
Home readings are not at goal either.  Changing losartan to telmisartan for improved control.  Continue amlodipine and hctz  Lab Results  Component Value Date   CREATININE 0.61 07/29/2020   Lab Results  Component Value Date   NA 139 07/29/2020   K 4.1 07/29/2020   CL 101 07/29/2020   CO2 29 07/29/2020

## 2020-08-03 ENCOUNTER — Telehealth: Payer: Self-pay

## 2020-08-03 NOTE — Chronic Care Management (AMB) (Signed)
  Chronic Care Management   Note  08/03/2020 Name: Emily Ryan MRN: [id number] DOB: 1945/04/12  Emily Ryan is a 75 y.o. year old female who is a primary care patient of Derrel Nip, Aris Everts, MD. I reached out to Emily Ryan by phone today in response to a referral sent by Ms. Jerrye Beavers Terriquez's PCP, Crecencio Mc, MD      Ms. Norgard was given information about Chronic Care Management services today including:  CCM service includes personalized support from designated clinical staff supervised by her physician, including individualized plan of care and coordination with other care providers 24/7 contact phone numbers for assistance for urgent and routine care needs. Service will only be billed when office clinical staff spend 20 minutes or more in a month to coordinate care. Only one practitioner may furnish and bill the service in a calendar month. The patient may stop CCM services at any time (effective at the end of the month) by phone call to the office staff. The patient will be responsible for cost sharing (co-pay) of up to 20% of the service fee (after annual deductible is met).  Patient agreed to services and verbal consent obtained.   Follow up plan: Telephone appointment with care management team member scheduled for:08/18/2020  Noreene Larsson, [address], [place], [place] [address] Direct Dial: [phone] [email or web address]

## 2020-08-10 ENCOUNTER — Telehealth: Payer: Self-pay | Admitting: Gastroenterology

## 2020-08-10 ENCOUNTER — Other Ambulatory Visit: Payer: Self-pay

## 2020-08-10 DIAGNOSIS — Z86010 Personal history of colonic polyps: Secondary | ICD-10-CM

## 2020-08-10 MED ORDER — NA SULFATE-K SULFATE-MG SULF 17.5-3.13-1.6 GM/177ML PO SOLN: 354.0000 mL | Freq: Once | ORAL | 0 refills | Status: AC

## 2020-08-10 NOTE — Telephone Encounter (Signed)
3 yr recall-Colonoscopy needs to be scheduled, per patient. Clinical staff will follow up with patient.

## 2020-08-10 NOTE — Telephone Encounter (Signed)
Called patient and schedule for colonoscopy with Dr. Marius Ditch on 11/05/2020. Went over instructions with patient, sent to [facility], and mailed them. Sent prep to the pharmacy

## 2020-08-13 ENCOUNTER — Other Ambulatory Visit: Payer: Self-pay

## 2020-08-13 NOTE — Progress Notes (Signed)
Returned patients call. Pt requested to r/s procedure date to 10/31. Endo unit ([facility]) has been notified of change and updated instructions will be sent via my chart and mailed.

## 2020-08-18 ENCOUNTER — Ambulatory Visit (INDEPENDENT_AMBULATORY_CARE_PROVIDER_SITE_OTHER): Payer: Medicare Other | Admitting: Pharmacist

## 2020-08-18 DIAGNOSIS — I10 Essential (primary) hypertension: Secondary | ICD-10-CM

## 2020-08-18 DIAGNOSIS — E785 Hyperlipidemia, unspecified: Secondary | ICD-10-CM

## 2020-08-18 DIAGNOSIS — E782 Mixed hyperlipidemia: Secondary | ICD-10-CM | POA: Diagnosis not present

## 2020-08-18 DIAGNOSIS — E1169 Type 2 diabetes mellitus with other specified complication: Secondary | ICD-10-CM

## 2020-08-18 DIAGNOSIS — I700 Atherosclerosis of aorta: Secondary | ICD-10-CM

## 2020-08-18 MED ORDER — OZEMPIC (0.25 OR 0.5 MG/DOSE) 2 MG/1.5ML ~~LOC~~ SOPN: PEN_INJECTOR | SUBCUTANEOUS | 0 refills | Status: DC

## 2020-08-18 MED ORDER — GLIPIZIDE 5 MG PO TABS: 5.0000 mg | ORAL_TABLET | Freq: Every day | ORAL | 1 refills | Status: DC

## 2020-08-18 NOTE — Patient Instructions (Signed)
Visit Information   PATIENT GOALS:   Goals Addressed               This Visit's Progress     Patient Stated     Medication Monitoring (pt-stated)        Patient Goals/Self-Care Activities Over the next 90 days, patient will:  - take medications as prescribed check glucose twice daily, document, and provide at future appointments check blood pressure perioidically, document, and provide at future appointments        Consent to CCM Services: Ms. Sawin was given information about Chronic Care Management services including:  CCM service includes personalized support from designated clinical staff supervised by her physician, including individualized plan of care and coordination with other care providers 24/7 contact phone numbers for assistance for urgent and routine care needs. Service will only be billed when office clinical staff spend 20 minutes or more in a month to coordinate care. Only one practitioner may furnish and bill the service in a calendar month. The patient may stop CCM services at any time (effective at the end of the month) by phone call to the office staff. The patient will be responsible for cost sharing (co-pay) of up to 20% of the service fee (after annual deductible is met).  Patient agreed to services and verbal consent obtained.   Patient verbalizes understanding of instructions provided today and agrees to view in [place].   Plan: Face to Face appointment with care management team member scheduled for: tomorrow  [name], PharmD, BCACP, CPP Clinical Pharmacist [place] at [facility]: Patient Care Plan: Medication Management     Problem Identified: Diabetes, HTN, HLD, psoriasis      Long-Range Goal: Disease Progression Prevention   Start Date: 08/18/2020  This Visit's Progress: On track  Priority: High  Note:   Current Barriers:  Unable to achieve control of diabetes   Pharmacist  Clinical Goal(s):  Over the next 90 days, patient will achieve control of diabetes as evidenced by A1c  through collaboration with PharmD and provider.  Interventions: 1:1 collaboration with Crecencio Mc, MD regarding development and update of comprehensive plan of care as evidenced by provider attestation and co-signature Inter-disciplinary care team collaboration (see longitudinal plan of care) Comprehensive medication review performed; medication list updated in electronic medical record  SDOH: Tricare for Life. No medication cost concerns.   Health Maintenance: Due for DM eye exam. Will discuss moving forward.   Diabetes: Uncontrolled; current treatment: metformin XR 0092 mg daily, Trulicity 1.5 mg weekly, glipizide XL 10 mg daily;  Current glucose readings: fasting glucose: 140-150s, post prandial glucose: 170-200s Reports hypoglycemic symptoms periodically, about once every 2 weeks if she eats a smaller meal Current meal patterns: breakfast: fruits, cheerios, life cereal, yogurt and banana; lunch: tomato sandwich; dinner: eggplant parmesan, chicken, chili, fish; snacks: cookies, candy - periodically; Reviewed long term macro and microvascular complications of diabetes Reviewed goal A1c, goal fasting, and goal 2 hour post prandial glucose.  Extensive dietary education. Discussed moderating portion sizes with carbohydrates, fruits.  Discussed that glipizide is likely causing the low blood sugar episodes, and can contribute to weight gain Extensive discussion of improved glycemic benefit and weight benefit w/ Ozempic vs Trulicity. Patient interested in switching. Stop Trulicity, start Ozempic. Will do quicker titration due to tolerability of GLP1 at baseline - Inject Ozempic 0.25 mg weekly for 2 weeks, then increase to Ozempic 0.5 mg weekly. Would consider Mounjaro, unfortunately, not available  on Medicare plans at this itme Patient notes she has an old script for glipizide IR 5 mg.  Stop glipizide XL 10 mg, start glipizide 5 mg QAM with breakfast. Plans to discontinue moving forward.   Hypertension: Uncontrolled at last clinic; current treatment: amlodipine 2.5 mg daily, telmisartan 80 mg daily (has not switched from losartan yet), HCTZ 12.5 mg daily Current home readings: will discuss after patient transitions to telmiartan Recommended to continue current regimen at this time,  Hyperlipidemia, Aortic Atherosclerosis: Could consider tighter control given DM + aortic atherosclerosis, as well as risk factor of inflammatory conditions; current treatment: simvastatin 20 mg daily, ezetimibe 10 mg daily;  Reports that she wants to minimize medications Current antiplatelet regimen: aspirin 81 mg daily Reviewed history. Simvastatin dose was reduced with amlodipine was added, resulting in need to add ezetimibe. No history of rosuvastatin.  Discussed the above with patient, along with recommendation for more stringent LDL goal. Patient amenable, but would like tomake one change at a time. Continue current regimen at this time.   Depression: Controlled per patient report; current regimen: escitalopram 5 mg daily Continue current regimen at this time  Psoriasis: Controlled per patient report; current regimen: methotrexate 7.5 mg once weekly, folic acid 1 mg daily Continue current regimen at this time along with collaboration with dermatology  Allergies: Controlled per patient report; current regimen: loratadine 10 mg daily, montelukast 10 mg daily Recommended to continue current regimen at this time  GERD: Controlled per patient report; current regimen: omeprazole 20 mg daily Recommended to continue current regimen at this time. Evaluate ability to reduce to PRN PPI or H2RA therapy moving forward.   Atrophic Vaginitis: Controlled per patient report; current regimen: premarin vaginal cream PRN Recommended to continue current regimen at this time.  Supplements: Vitamin D,  Vitamin C, Calcium, MVI  Patient Goals/Self-Care Activities Over the next 90 days, patient will:  - take medications as prescribed check glucose twice daily, document, and provide at future appointments check blood pressure perioidically, document, and provide at future appointments  Follow Up Plan: Face to Face appointment with care management team member scheduled for:  tomorrow

## 2020-08-18 NOTE — Chronic Care Management (AMB) (Signed)
Chronic Care Management Pharmacy Note  08/18/2020 Name:  Emily Ryan MRN:  [id number] DOB:  1945/01/17   Subjective: Emily Ryan is an 75 y.o. year old female who is a primary patient of Derrel Nip, Aris Everts, MD.  The CCM team was consulted for assistance with disease management and care coordination needs.    Engaged with patient by telephone for initial visit in response to provider referral for pharmacy case management and/or care coordination services.   Consent to Services:  The patient was given the following information about Chronic Care Management services today, agreed to services, and gave verbal consent: 1. CCM service includes personalized support from designated clinical staff supervised by the primary care provider, including individualized plan of care and coordination with other care providers 2. 24/7 contact phone numbers for assistance for urgent and routine care needs. 3. Service will only be billed when office clinical staff spend 20 minutes or more in a month to coordinate care. 4. Only one practitioner may furnish and bill the service in a calendar month. 5.The patient may stop CCM services at any time (effective at the end of the month) by phone call to the office staff. 6. The patient will be responsible for cost sharing (co-pay) of up to 20% of the service fee (after annual deductible is met). Patient agreed to services and consent obtained.  Patient Care Team: Crecencio Mc, MD as PCP - General (Internal Medicine) Minna Merritts, MD as Consulting Physician (Cardiology) Lin Landsman, MD as Consulting Physician (Gastroenterology) Michael Boston, MD as Consulting Physician (General Surgery) [name], RPH-CPP (Pharmacist)  Recent office visits: 7/28 - PCP - changed losartan to telmisartan, plan to change from Trulicity to Ozempic, P5T 8.0, LDL 80, eGFR 87   Objective:  Lab Results  Component Value Date   CREATININE 0.61 07/29/2020    CREATININE 0.62 05/27/2020   CREATININE 0.63 04/16/2020    Lab Results  Component Value Date   HGBA1C 8.0 (H) 07/29/2020   Last diabetic Eye exam:  Lab Results  Component Value Date/Time   HMDIABEYEEXA No Retinopathy 06/12/2018 12:00 AM    Last diabetic Foot exam:  Lab Results  Component Value Date/Time   HMDIABFOOTEX normal 01/27/2014 12:00 AM        Component Value Date/Time   CHOL 161 07/29/2020 0813   CHOL 155 01/08/2019 1122   TRIG 168.0 (H) 07/29/2020 0813   TRIG 218 (H) 01/08/2019 1122   HDL 47.20 07/29/2020 0813   CHOLHDL 3 07/29/2020 0813   VLDL 33.6 07/29/2020 0813   LDLCALC 80 07/29/2020 0813   LDLDIRECT 72.0 04/12/2018 0825    Hepatic Function Latest Ref Rng & Units 07/29/2020 05/27/2020 04/16/2020  Total Protein 6.0 - 8.3 g/dL 6.8 6.7 6.8  Albumin 3.5 - 5.2 g/dL 4.0 4.2 3.9  AST 0 - 37 U/L 24 22 22   ALT 0 - 35 U/L 36(H) 32 32  Alk Phosphatase 39 - 117 U/L 86 94 91  Total Bilirubin 0.2 - 1.2 mg/dL 0.5 0.6 0.7  Bilirubin, Direct 0.0 - 0.3 mg/dL - - -    Lab Results  Component Value Date/Time   TSH 3.11 01/14/2020 08:02 AM   TSH 1.62 02/04/2015 01:37 PM    CBC Latest Ref Rng & Units 07/29/2020 05/27/2020 02/19/2020  WBC 4.0 - 10.5 K/uL 6.0 6.2 5.7  Hemoglobin 12.0 - 15.0 g/dL 13.6 14.5 14.5  Hematocrit 36.0 - 46.0 % 40.2 42.3 42.2  Platelets 150.0 - 400.0  K/uL 290.0 263.0 239    Lab Results  Component Value Date/Time   VD25OH 35.87 07/29/2020 08:13 AM   VD25OH 34.28 11/29/2017 09:15 AM    Clinical ASCVD: No  The 10-year ASCVD risk score Mikey Bussing DC Jr., et al., 2013) is: 42.4%   Values used to calculate the score:     Age: 66 years     Sex: Female     Is Non-Hispanic African American: No     Diabetic: Yes     Tobacco smoker: No     Systolic Blood Pressure: 563 mmHg     Is BP treated: Yes     HDL Cholesterol: 47.2 mg/dL     Total Cholesterol: 161 mg/dL     Social History   Tobacco Use  Smoking Status Never  Smokeless Tobacco Never   Tobacco Comments   father growing up   BP Readings from Last 3 Encounters:  07/30/20 (!) 142/68  06/12/20 (!) 150/70  06/12/20 (!) 163/81   Pulse Readings from Last 3 Encounters:  07/30/20 83  06/12/20 79  06/12/20 73   Wt Readings from Last 3 Encounters:  07/30/20 132 lb 6.4 oz (60.1 kg)  06/12/20 132 lb (59.9 kg)  06/12/20 132 lb (59.9 kg)    Assessment: Review of patient past medical history, allergies, medications, health status, including review of consultants reports, laboratory and other test data, was performed as part of comprehensive evaluation and provision of chronic care management services.   SDOH:  (Social Determinants of Health) assessments and interventions performed:  SDOH Interventions    Flowsheet Row Most Recent Value  SDOH Interventions   Financial Strain Interventions Intervention Not Indicated       CCM Care Plan  Allergies  Allergen Reactions   Prednisone Hives    Medications Reviewed Today     Reviewed by [name], RPH-CPP (Pharmacist) on 08/18/20 at 1110  Med List Status: <None>   Medication Order Taking? Sig Documenting Provider Last Dose Status Informant  amLODipine (NORVASC) 2.5 MG tablet [id number]  Take 1 tablet (2.5 mg total) by mouth every evening. Crecencio Mc, MD  Active   Ascorbic Acid (VITAMIN C) 1000 MG tablet [id number] Yes Take 1,000 mg by mouth daily.  [provider] Taking Active Self  aspirin EC 81 MG tablet [id number] Yes Take 81 mg by mouth at bedtime. Swallow whole. [provider] Taking Active Self  Calcium Carbonate-Vitamin D3 600-400 MG-UNIT TABS [id number] Yes Take 1 tablet by mouth daily. [provider] Taking Active Self  clobetasol cream (TEMOVATE) 0.05 % [id number] Yes Apply 1 application topically 2 (two) times daily as needed (psoriasis). [provider] Taking Active Self  conjugated estrogens (PREMARIN) vaginal cream [id number] Yes Place 1 Applicatorful vaginally  daily.  Patient taking differently: Place 1 Applicatorful vaginally daily as needed (dryness/irritation).   Rubie Maid, MD Taking Active   cyclobenzaprine (FLEXERIL) 5 MG tablet [id number] No Take 1 tablet (5 mg total) by mouth 3 (three) times daily as needed.  Patient not taking: Reported on 08/18/2020   Crecencio Mc, MD Not Taking Active   Dulaglutide (TRULICITY) 1.5 UU/8.2CM SOPN [id number] Yes Inject 1.5 mg into the skin once a week.  Patient taking differently: Inject 1.5 mg into the skin every Sunday.   Crecencio Mc, MD Taking Active   escitalopram (LEXAPRO) 5 MG tablet [id number] Yes Take 1 tablet (5 mg total) by mouth every evening. Crecencio Mc, MD Taking Active   ezetimibe (ZETIA) 10  MG tablet [id number] Yes Take 1 tablet (10 mg total) by mouth daily. Crecencio Mc, MD Taking Active   folic acid (FOLVITE) 1 MG tablet [id number] Yes Take 1 tablet (1 mg total) by mouth See admin instructions. Takes daily except on Saturday Crecencio Mc, MD Taking Active   glipiZIDE (GLUCOTROL XL) 10 MG 24 hr tablet [id number] Yes Take 1 tablet (10 mg total) by mouth daily with breakfast. Crecencio Mc, MD Taking Active   glucose blood (FREESTYLE LITE) test strip [id number] Yes Use as instructed to test blood sugar twice daily Crecencio Mc, MD Taking Active Self  hydrochlorothiazide (HYDRODIURIL) 12.5 MG tablet [id number] Yes Take 1 tablet (12.5 mg total) by mouth daily. Crecencio Mc, MD Taking Active Self  loratadine (CLARITIN) 10 MG tablet [id number] Yes Take 1 tablet (10 mg total) by mouth daily. Crecencio Mc, MD Taking Active   metFORMIN (GLUCOPHAGE XR) 750 MG 24 hr tablet [id number] Yes Take 2 tablets (1,500 mg total) by mouth daily with breakfast. Crecencio Mc, MD Taking Active   methotrexate (RHEUMATREX) 2.5 MG tablet [id number] Yes Take 3 tablets (7.5 mg total) by mouth once a week. Caution:Chemotherapy. Protect from light. Crecencio Mc, MD Taking Active   montelukast  (SINGULAIR) 10 MG tablet [id number] Yes Take 1 tablet (10 mg total) by mouth at bedtime. Crecencio Mc, MD Taking Active Self  Multiple Vitamin (MULTIVITAMIN) capsule [id number] Yes Take 1 capsule by mouth daily. [provider] Taking Active Self  omeprazole (PRILOSEC) 20 MG capsule [id number] Yes Take 1 capsule (20 mg total) by mouth daily. Crecencio Mc, MD Taking Active   Precision Thin Lancets MISC [id number] Yes Use as directed to check sugars twice daily Crecencio Mc, MD Taking Active Self  simvastatin (ZOCOR) 20 MG tablet [id number] Yes Take 1 tablet (20 mg total) by mouth at bedtime. Crecencio Mc, MD Taking Active Self  SUPREP BOWEL PREP KIT 17.5-3.13-1.6 GM/177ML SOLN [id number] No SMARTSIG:354 Milliliter(s) By Mouth Once  Patient not taking: Reported on 08/18/2020   [provider] Not Taking Active   telmisartan (MICARDIS) 80 MG tablet [id number] No Take 1 tablet (80 mg total) by mouth daily.  Patient not taking: Reported on 08/18/2020   Crecencio Mc, MD Not Taking Active            Med Note [name]   Tue Aug 18, 2020 11:10 AM) Completing losartan prescription first  traMADol (ULTRAM) 50 MG tablet [id number] No Take 1 tablet (50 mg total) by mouth every 6 (six) hours as needed (Breakthrough pain).  Patient not taking: No sig reported   Poggi, Marshall Cork, MD Not Taking Active   vitamin E 400 UNIT capsule [id number] Yes Take 400 Units by mouth daily. [provider] Taking Active Self            Patient Active Problem List   Diagnosis Date Noted   Lumbar degenerative disc disease 07/31/2020   Leg pain, posterior, left 05/27/2020   S/P shoulder replacement, right 04/24/2020   Status post reverse total shoulder replacement, right 04/06/2020   Anastomotic stricture of colorectal region 03/10/2020   Hemorrhage of colon due to diverticulosis 03/10/2020   Rotator cuff arthropathy, right 11/25/2019   Travel advice encounter 10/15/2019   Fall at  home, initial encounter 10/14/2019   Atherosclerosis of aorta ([place]) 10/14/2019   Vaginal atrophy 08/20/2019   Dyspareunia in female 08/20/2019   Perineal rash in female 04/12/2019   Hearing  loss 04/12/2019   Breast calcifications on mammogram 09/13/2018   Family history of Alzheimer's disease 08/13/2018   Macrocytosis without anemia 04/14/2018   Hx of colonic polyps    S/P partial colectomy 08/05/2017   Tubular adenoma of colon 08/03/2017   Overweight (BMI 25.0-29.9) 05/04/2016   Basal cell carcinoma of right forehead 08/28/2015   Patent foramen ovale with right to left shunt 08/28/2015   Postmenopausal estrogen deficiency 05/07/2015   DM type 2 with diabetic mixed hyperlipidemia ([place]) 02/08/2015   Uncontrolled type 2 diabetes mellitus ([place]) 02/08/2015   Allergic rhinitis 01/28/2013   Basal cell carcinoma of left side of nose 04/28/2012   Long-term use of high-risk medication 10/21/2011   Routine general medical examination at a health care facility 07/18/2011   Guttate psoriasis 04/04/2011   Constipation 01/19/2011   Reflux esophagitis 01/19/2011   Diverticulosis of sigmoid colon 11/10/2010   White coat syndrome with diagnosis of hypertension 09/07/2010   Hyperlipidemia associated with type 2 diabetes mellitus ([place]) 09/07/2010   Hyperlipidemia 09/07/2010    Immunization History  Administered Date(s) Administered   Fluad Quad(high Dose 65+) 10/03/2018   Influenza Split 10/19/2011   Influenza, High Dose Seasonal PF 10/01/2014, 10/06/2016, 09/06/2017   Influenza,inj,Quad PF,6+ Mos 10/31/2012, 09/26/2013, 08/26/2015   Influenza-Unspecified 10/07/2019   PFIZER(Purple Top)SARS-COV-2 Vaccination 02/12/2019, 03/05/2019, 10/09/2019, 04/08/2020   Pneumococcal Conjugate-13 01/28/2013   Pneumococcal Polysaccharide-23 11/02/2010, 05/04/2016   Tdap 11/16/2010   Zoster Recombinat (Shingrix) 06/25/2017, 09/06/2017   Zoster, Live 10/19/2007    Conditions to be addressed/monitored: HTN,  HLD, and DMII  Care Plan : Medication Management  Updates made by [name], RPH-CPP since 08/18/2020 12:00 AM     Problem: Diabetes, HTN, HLD, psoriasis      Long-Range Goal: Disease Progression Prevention   Start Date: 08/18/2020  This Visit's Progress: On track  Priority: High  Note:   Current Barriers:  Unable to achieve control of diabetes   Pharmacist Clinical Goal(s):  Over the next 90 days, patient will achieve control of diabetes as evidenced by A1c  through collaboration with PharmD and provider.  Interventions: 1:1 collaboration with Crecencio Mc, MD regarding development and update of comprehensive plan of care as evidenced by provider attestation and co-signature Inter-disciplinary care team collaboration (see longitudinal plan of care) Comprehensive medication review performed; medication list updated in electronic medical record  SDOH: Tricare for Life. No medication cost concerns.   Health Maintenance: Due for DM eye exam. Will discuss moving forward.   Diabetes: Uncontrolled; current treatment: metformin XR 7322 mg daily, Trulicity 1.5 mg weekly, glipizide XL 10 mg daily;  Current glucose readings: fasting glucose: 140-150s, post prandial glucose: 170-200s Reports hypoglycemic symptoms periodically, about once every 2 weeks if she eats a smaller meal Current meal patterns: breakfast: fruits, cheerios, life cereal, yogurt and banana; lunch: tomato sandwich; dinner: eggplant parmesan, chicken, chili, fish; snacks: cookies, candy - periodically; Reviewed long term macro and microvascular complications of diabetes Reviewed goal A1c, goal fasting, and goal 2 hour post prandial glucose.  Extensive dietary education. Discussed moderating portion sizes with carbohydrates, fruits.  Discussed that glipizide is likely causing the low blood sugar episodes, and can contribute to weight gain Extensive discussion of improved glycemic benefit and weight benefit w/  Ozempic vs Trulicity. Patient interested in switching. Stop Trulicity, start Ozempic. Will do quicker titration due to tolerability of GLP1 at baseline - Inject Ozempic 0.25 mg weekly for 2 weeks, then increase to Ozempic 0.5 mg weekly. Would consider Mounjaro,  unfortunately, not available on Medicare plans at this itme Patient notes she has an old script for glipizide IR 5 mg. Stop glipizide XL 10 mg, start glipizide 5 mg QAM with breakfast. Plans to discontinue moving forward.   Hypertension: Uncontrolled at last clinic; current treatment: amlodipine 2.5 mg daily, telmisartan 80 mg daily (has not switched from losartan yet), HCTZ 12.5 mg daily Current home readings: will discuss after patient transitions to telmiartan Recommended to continue current regimen at this time,  Hyperlipidemia, Aortic Atherosclerosis: Could consider tighter control given DM + aortic atherosclerosis, as well as risk factor of inflammatory conditions; current treatment: simvastatin 20 mg daily, ezetimibe 10 mg daily;  Reports that she wants to minimize medications Current antiplatelet regimen: aspirin 81 mg daily Reviewed history. Simvastatin dose was reduced with amlodipine was added, resulting in need to add ezetimibe. No history of rosuvastatin.  Discussed the above with patient, along with recommendation for more stringent LDL goal. Patient amenable, but would like tomake one change at a time. Continue current regimen at this time.   Depression: Controlled per patient report; current regimen: escitalopram 5 mg daily Continue current regimen at this time  Psoriasis: Controlled per patient report; current regimen: methotrexate 7.5 mg once weekly, folic acid 1 mg daily Continue current regimen at this time along with collaboration with dermatology  Allergies: Controlled per patient report; current regimen: loratadine 10 mg daily, montelukast 10 mg daily Recommended to continue current regimen at this  time  GERD: Controlled per patient report; current regimen: omeprazole 20 mg daily Recommended to continue current regimen at this time. Evaluate ability to reduce to PRN PPI or H2RA therapy moving forward.   Atrophic Vaginitis: Controlled per patient report; current regimen: premarin vaginal cream PRN Recommended to continue current regimen at this time.  Supplements: Vitamin D, Vitamin C, Calcium, MVI  Patient Goals/Self-Care Activities Over the next 90 days, patient will:  - take medications as prescribed check glucose twice daily, document, and provide at future appointments check blood pressure perioidically, document, and provide at future appointments  Follow Up Plan: Face to Face appointment with care management team member scheduled for:  tomorrow      Medication Assistance: None required.  Patient affirms current coverage meets needs.  Patient's preferred pharmacy is:  [id number] [address], [place] [address] Phone: [phone] Fax: [phone]  [facility] [place], [place] [address] [place] [address] Phone: [phone] Fax: [phone]   Follow Up:  Patient agrees to Care Plan and Follow-up.  Plan: Face to Face appointment with care management team member scheduled for: tomorrow  [name], PharmD, BCACP, CPP Clinical Pharmacist [place] at [facility] [phone]  Medication Samples have been provided to the patient.  Drug name: Ozempic       Strength: 0.25/0.5        Qty: 1 pen  LOT: [id number]  Exp.Date: 02/03/2023

## 2020-08-19 ENCOUNTER — Ambulatory Visit: Payer: Medicare Other | Admitting: Pharmacist

## 2020-08-19 ENCOUNTER — Other Ambulatory Visit: Payer: Self-pay

## 2020-08-19 DIAGNOSIS — I10 Essential (primary) hypertension: Secondary | ICD-10-CM

## 2020-08-19 DIAGNOSIS — I700 Atherosclerosis of aorta: Secondary | ICD-10-CM

## 2020-08-19 DIAGNOSIS — E1169 Type 2 diabetes mellitus with other specified complication: Secondary | ICD-10-CM

## 2020-08-19 MED ORDER — ROSUVASTATIN CALCIUM 10 MG PO TABS: 10.0000 mg | ORAL_TABLET | Freq: Every day | ORAL | 3 refills | Status: DC

## 2020-08-19 NOTE — Chronic Care Management (AMB) (Signed)
Chronic Care Management Pharmacy Note  08/19/2020 Name:  Emily Ryan MRN:  [id number] DOB:  [birth year]  Subjective: Emily Ryan is an 75 y.o. year old female who is a primary patient of Derrel Nip, Aris Everts, MD.  The CCM team was consulted for assistance with disease management and care coordination needs.    Engaged with patient face to face for follow up visit in response to provider referral for pharmacy case management and/or care coordination services.   Consent to Services:  The patient was given information about Chronic Care Management services, agreed to services, and gave verbal consent prior to initiation of services.  Please see initial visit note for detailed documentation.   Patient Care Team: Crecencio Mc, MD as PCP - General (Internal Medicine) Minna Merritts, MD as Consulting Physician (Cardiology) Lin Landsman, MD as Consulting Physician (Gastroenterology) Michael Boston, MD as Consulting Physician (General Surgery) [name], RPH-CPP (Pharmacist)   Objective:  Lab Results  Component Value Date   CREATININE 0.61 07/29/2020   CREATININE 0.62 05/27/2020   CREATININE 0.63 04/16/2020    Lab Results  Component Value Date   HGBA1C 8.0 (H) 07/29/2020   Last diabetic Eye exam:  Lab Results  Component Value Date/Time   HMDIABEYEEXA No Retinopathy 06/12/2018 12:00 AM    Last diabetic Foot exam:  Lab Results  Component Value Date/Time   HMDIABFOOTEX normal 01/27/2014 12:00 AM        Component Value Date/Time   CHOL 161 07/29/2020 0813   CHOL 155 01/08/2019 1122   TRIG 168.0 (H) 07/29/2020 0813   TRIG 218 (H) 01/08/2019 1122   HDL 47.20 07/29/2020 0813   CHOLHDL 3 07/29/2020 0813   VLDL 33.6 07/29/2020 0813   LDLCALC 80 07/29/2020 0813   LDLDIRECT 72.0 04/12/2018 0825    Hepatic Function Latest Ref Rng & Units 07/29/2020 05/27/2020 04/16/2020  Total Protein 6.0 - 8.3 g/dL 6.8 6.7 6.8  Albumin 3.5 - 5.2 g/dL 4.0 4.2 3.9   AST 0 - 37 U/L _0 ALT 0 - 35 U/L 36(H) 32 32  Alk Phosphatase 39 - 117 U/L 86 94 91  Total Bilirubin 0.2 - 1.2 mg/dL 0.5 0.6 0.7  Bilirubin, Direct 0.0 - 0.3 mg/dL - - -    Lab Results  Component Value Date/Time   TSH 3.11 01/14/2020 08:02 AM   TSH 1.62 02/04/2015 01:37 PM    CBC Latest Ref Rng & Units 07/29/2020 05/27/2020 02/19/2020  WBC 4.0 - 10.5 K/uL 6.0 6.2 5.7  Hemoglobin 12.0 - 15.0 g/dL 13.6 14.5 14.5  Hematocrit 36.0 - 46.0 % 40.2 42.3 42.2  Platelets 150.0 - 400.0 K/uL 290.0 263.0 239    Lab Results  Component Value Date/Time   VD25OH 35.87 07/29/2020 08:13 AM   VD25OH 34.28 11/29/2017 09:15 AM    Clinical ASCVD: Yes  The 10-year ASCVD risk score Mikey Bussing DC Jr., et al., 2013) is: 42.4%   Values used to calculate the score:     Age: 43 years     Sex: Female     Is Non-Hispanic African American: No     Diabetic: Yes     Tobacco smoker: No     Systolic Blood Pressure: 496 mmHg     Is BP treated: Yes     HDL Cholesterol: 47.2 mg/dL     Total Cholesterol: 161 mg/dL      Social History   Tobacco Use  Smoking Status Never  Smokeless  Tobacco Never  Tobacco Comments   father growing up   BP Readings from Last 3 Encounters:  07/30/20 (!) 142/68  06/12/20 (!) 150/70  06/12/20 (!) 163/81   Pulse Readings from Last 3 Encounters:  07/30/20 83  06/12/20 79  06/12/20 73   Wt Readings from Last 3 Encounters:  07/30/20 132 lb 6.4 oz (60.1 kg)  06/12/20 132 lb (59.9 kg)  06/12/20 132 lb (59.9 kg)    Assessment: Review of patient past medical history, allergies, medications, health status, including review of consultants reports, laboratory and other test data, was performed as part of comprehensive evaluation and provision of chronic care management services.   SDOH:  (Social Determinants of Health) assessments and interventions performed:  SDOH Interventions    Flowsheet Row Most Recent Value  SDOH Interventions   Financial Strain Interventions  Intervention Not Indicated       CCM Care Plan  Allergies  Allergen Reactions   Prednisone Hives    Medications Reviewed Today     Reviewed by [name], RPH-CPP (Pharmacist) on 08/18/20 at 1110  Med List Status: <None>   Medication Order Taking? Sig Documenting Provider Last Dose Status Informant  amLODipine (NORVASC) 2.5 MG tablet [id number]  Take 1 tablet (2.5 mg total) by mouth every evening. Crecencio Mc, MD  Active   Ascorbic Acid (VITAMIN C) 1000 MG tablet [id number] Yes Take 1,000 mg by mouth daily.  [provider] Taking Active Self  aspirin EC 81 MG tablet [id number] Yes Take 81 mg by mouth at bedtime. Swallow whole. [provider] Taking Active Self  Calcium Carbonate-Vitamin D3 600-400 MG-UNIT TABS [id number] Yes Take 1 tablet by mouth daily. [provider] Taking Active Self  clobetasol cream (TEMOVATE) 0.05 % [id number] Yes Apply 1 application topically 2 (two) times daily as needed (psoriasis). [provider] Taking Active Self  conjugated estrogens (PREMARIN) vaginal cream [id number] Yes Place 1 Applicatorful vaginally daily.  Patient taking differently: Place 1 Applicatorful vaginally daily as needed (dryness/irritation).   Rubie Maid, MD Taking Active   cyclobenzaprine (FLEXERIL) 5 MG tablet [id number] No Take 1 tablet (5 mg total) by mouth 3 (three) times daily as needed.  Patient not taking: Reported on 08/18/2020   Crecencio Mc, MD Not Taking Active   Dulaglutide (TRULICITY) 1.5 IO/0.3TD SOPN [id number] Yes Inject 1.5 mg into the skin once a week.  Patient taking differently: Inject 1.5 mg into the skin every Sunday.   Crecencio Mc, MD Taking Active   escitalopram (LEXAPRO) 5 MG tablet [id number] Yes Take 1 tablet (5 mg total) by mouth every evening. Crecencio Mc, MD Taking Active   ezetimibe (ZETIA) 10 MG tablet [id number] Yes Take 1 tablet (10 mg total) by mouth daily. Crecencio Mc, MD Taking Active    folic acid (FOLVITE) 1 MG tablet [id number] Yes Take 1 tablet (1 mg total) by mouth See admin instructions. Takes daily except on Saturday Crecencio Mc, MD Taking Active   glipiZIDE (GLUCOTROL XL) 10 MG 24 hr tablet [id number] Yes Take 1 tablet (10 mg total) by mouth daily with breakfast. Crecencio Mc, MD Taking Active   glucose blood (FREESTYLE LITE) test strip [id number] Yes Use as instructed to test blood sugar twice daily Crecencio Mc, MD Taking Active Self  hydrochlorothiazide (HYDRODIURIL) 12.5 MG tablet [id number] Yes Take 1 tablet (12.5 mg total) by mouth daily. Crecencio Mc, MD Taking Active Self  loratadine (CLARITIN) 10 MG tablet [id number] Yes Take  1 tablet (10 mg total) by mouth daily. Crecencio Mc, MD Taking Active   metFORMIN (GLUCOPHAGE XR) 750 MG 24 hr tablet [id number] Yes Take 2 tablets (1,500 mg total) by mouth daily with breakfast. Crecencio Mc, MD Taking Active   methotrexate (RHEUMATREX) 2.5 MG tablet [id number] Yes Take 3 tablets (7.5 mg total) by mouth once a week. Caution:Chemotherapy. Protect from light. Crecencio Mc, MD Taking Active   montelukast (SINGULAIR) 10 MG tablet [id number] Yes Take 1 tablet (10 mg total) by mouth at bedtime. Crecencio Mc, MD Taking Active Self  Multiple Vitamin (MULTIVITAMIN) capsule [id number] Yes Take 1 capsule by mouth daily. [provider] Taking Active Self  omeprazole (PRILOSEC) 20 MG capsule [id number] Yes Take 1 capsule (20 mg total) by mouth daily. Crecencio Mc, MD Taking Active   Precision Thin Lancets MISC [id number] Yes Use as directed to check sugars twice daily Crecencio Mc, MD Taking Active Self  simvastatin (ZOCOR) 20 MG tablet [id number] Yes Take 1 tablet (20 mg total) by mouth at bedtime. Crecencio Mc, MD Taking Active Self  SUPREP BOWEL PREP KIT 17.5-3.13-1.6 GM/177ML SOLN [id number] No SMARTSIG:354 Milliliter(s) By Mouth Once  Patient not taking: Reported on 08/18/2020   [provider] Not  Taking Active   telmisartan (MICARDIS) 80 MG tablet [id number] No Take 1 tablet (80 mg total) by mouth daily.  Patient not taking: Reported on 08/18/2020   Crecencio Mc, MD Not Taking Active            Med Note [name]   Tue Aug 18, 2020 11:10 AM) Completing losartan prescription first  traMADol (ULTRAM) 50 MG tablet [id number] No Take 1 tablet (50 mg total) by mouth every 6 (six) hours as needed (Breakthrough pain).  Patient not taking: No sig reported   Poggi, Marshall Cork, MD Not Taking Active   vitamin E 400 UNIT capsule [id number] Yes Take 400 Units by mouth daily. [provider] Taking Active Self            Patient Active Problem List   Diagnosis Date Noted   Lumbar degenerative disc disease 07/31/2020   Leg pain, posterior, left 05/27/2020   S/P shoulder replacement, right 04/24/2020   Status post reverse total shoulder replacement, right 04/06/2020   Anastomotic stricture of colorectal region 03/10/2020   Hemorrhage of colon due to diverticulosis 03/10/2020   Rotator cuff arthropathy, right 11/25/2019   Travel advice encounter 10/15/2019   Fall at home, initial encounter 10/14/2019   Atherosclerosis of aorta ([place]) 10/14/2019   Vaginal atrophy 08/20/2019   Dyspareunia in female 08/20/2019   Perineal rash in female 04/12/2019   Hearing loss 04/12/2019   Breast calcifications on mammogram 09/13/2018   Family history of Alzheimer's disease 08/13/2018   Macrocytosis without anemia 04/14/2018   Hx of colonic polyps    S/P partial colectomy 08/05/2017   Tubular adenoma of colon 08/03/2017   Overweight (BMI 25.0-29.9) 05/04/2016   Basal cell carcinoma of right forehead 08/28/2015   Patent foramen ovale with right to left shunt 08/28/2015   Postmenopausal estrogen deficiency 05/07/2015   DM type 2 with diabetic mixed hyperlipidemia ([place]) 02/08/2015   Uncontrolled type 2 diabetes mellitus ([place]) 02/08/2015   Allergic rhinitis 01/28/2013   Basal cell carcinoma  of left side of nose 04/28/2012   Long-term use of high-risk medication 10/21/2011   Routine general medical examination at a health care facility 07/18/2011   Guttate psoriasis 04/04/2011   Constipation  01/19/2011   Reflux esophagitis 01/19/2011   Diverticulosis of sigmoid colon 11/10/2010   White coat syndrome with diagnosis of hypertension 09/07/2010   Hyperlipidemia associated with type 2 diabetes mellitus ([place]) 09/07/2010   Hyperlipidemia 09/07/2010    Immunization History  Administered Date(s) Administered   Fluad Quad(high Dose 65+) 10/03/2018   Influenza Split 10/19/2011   Influenza, High Dose Seasonal PF 10/01/2014, 10/06/2016, 09/06/2017   Influenza,inj,Quad PF,6+ Mos 10/31/2012, 09/26/2013, 08/26/2015   Influenza-Unspecified 10/07/2019   PFIZER(Purple Top)SARS-COV-2 Vaccination 02/12/2019, 03/05/2019, 10/09/2019, 04/08/2020   Pneumococcal Conjugate-13 01/28/2013   Pneumococcal Polysaccharide-23 11/02/2010, 05/04/2016   Tdap 11/16/2010   Zoster Recombinat (Shingrix) 06/25/2017, 09/06/2017   Zoster, Live 10/19/2007    Conditions to be addressed/monitored: HTN, HLD, and DMII  Care Plan : Medication Management  Updates made by [name], RPH-CPP since 08/19/2020 12:00 AM     Problem: Diabetes, HTN, HLD, psoriasis      Long-Range Goal: Disease Progression Prevention   Start Date: 08/18/2020  This Visit's Progress: On track  Recent Progress: On track  Priority: High  Note:   Current Barriers:  Unable to achieve control of diabetes   Pharmacist Clinical Goal(s):  Over the next 90 days, patient will achieve control of diabetes as evidenced by A1c  through collaboration with PharmD and provider.  Interventions: 1:1 collaboration with Crecencio Mc, MD regarding development and update of comprehensive plan of care as evidenced by provider attestation and co-signature Inter-disciplinary care team collaboration (see longitudinal plan of care) Comprehensive  medication review performed; medication list updated in electronic medical record  SDOH: [employer] for Life. No medication cost concerns.   Health Maintenance: Due for DM eye exam. Will discuss moving forward.   Diabetes: Uncontrolled; current treatment: metformin XR 1500 mg daily, Ozempic 0.25 mg weekly - to start this week glipizide 5 mg daily;  Patient presented for [facility] teaching today. Provided with sample. She will start on Sunday, as she has been taking Trulicity on Sundays. Educated on goal A1c, goal fasting, and goal 2 hour post prandial glucose goals.   Hypertension: Uncontrolled at last clinic; current treatment: amlodipine 2.5 mg daily, telmisartan 80 mg daily (has not switched from losartan yet), HCTZ 12.5 mg daily Current home readings: will discuss after patient transitions to telmiartan Previously recommended to continue current regimen at this time,  Hyperlipidemia, Aortic Atherosclerosis: Could consider tighter control given DM + aortic atherosclerosis, as well as risk factor of inflammatory conditions; current treatment: simvastatin 20 mg daily, ezetimibe 10 mg daily;  Reports that she wants to minimize medications Current antiplatelet regimen: aspirin 81 mg daily Patient interested in switching medications today as we discussed yesterday. Discontinue simvastatin and ezetimibe. Start rosuvastatin 10 mg daily. Lipids and CMP ordered with her regular upcoming lab work. Follow for LDL, consider dose increase if patient tolerates rosuvastatin 10 mg daily and LDL is not at goal <70.   Depression: Controlled per patient report; current regimen: escitalopram 5 mg daily Previously recommended to continue current regimen at this time  Psoriasis: Controlled per patient report; current regimen: methotrexate 7.5 mg once weekly, folic acid 1 mg daily Previously recommended to continue current regimen at this time along with collaboration with dermatology  Allergies: Controlled  per patient report; current regimen: loratadine 10 mg daily, montelukast 10 mg daily Previously recommended to continue current regimen at this time  GERD: Controlled per patient report; current regimen: omeprazole 20 mg daily Previously recommended to continue current regimen at this time. Evaluate ability to reduce  to PRN PPI or H2RA therapy moving forward.   Atrophic Vaginitis: Controlled per patient report; current regimen: premarin vaginal cream PRN Previously recommended to continue current regimen at this time.  Supplements: Vitamin D, Vitamin C, Calcium, MVI  Patient Goals/Self-Care Activities Over the next 90 days, patient will:  - take medications as prescribed check glucose twice daily, document, and provide at future appointments check blood pressure perioidically, document, and provide at future appointments  Follow Up Plan: Face to Face appointment with care management team member scheduled for:  ~10 weeks w/ PCP visit       Medication Assistance: None required.  Patient affirms current coverage meets needs.  Patient's preferred pharmacy is:  [id number] [address], [place] [address] Phone: [phone] Fax: [phone]  [address] [place], [place] [address] [place] [address] Phone: [phone] Fax: [phone]    Follow Up:  Patient agrees to Care Plan and Follow-up.  Plan: Telephone follow up appointment with care management team member scheduled for:  ~10 weeks w/ PCP visit  [name], PharmD, [place], [place] Clinical Pharmacist [employer] at [facility] [phone]

## 2020-08-19 NOTE — Patient Instructions (Addendum)
It was great to meet you today!  On Sunday, start Ozempic. Inject 0.25 mg weekly for 2 weeks, then increase to 0.5 mg weekly. Let me know where you want [place] to send the Ozempic prescription.   Change glipizide to 5 mg (immediate release, not XL) once daily with breakfast. Call me if you start to have issues with low blood sugars.   Check your blood sugars twice daily:  1) Fasting, first thing in the morning before breakfast and  2) 2 hours after your largest meal.   For a goal A1c of less than 7%, goal fasting readings are less than 130 and goal 2 hour after meal readings are less than 180.   Stop simvastatin and ezetimibe. Start rosuvastatin 10 mg daily. We will follow up on your lab work with your regularly scheduled lab work in October.   Take care!  [name], PharmD [phone]  Visit Information  PATIENT GOALS:  Goals Addressed               This Visit's Progress     Patient Stated     Medication Monitoring (pt-stated)        Patient Goals/Self-Care Activities Over the next 90 days, patient will:  - take medications as prescribed check glucose twice daily, document, and provide at future appointments check blood pressure perioidically, document, and provide at future appointments         Print copy of patient instructions, educational materials, and care plan provided in person.  Plan: Telephone follow up appointment with care management team member scheduled for:  ~10 weeks w/ PCP visit  [name], PharmD, [place], [place] Clinical Pharmacist [employer] at [facility] [phone]

## 2020-08-24 ENCOUNTER — Ambulatory Visit: Payer: Medicare Other | Admitting: Pharmacist

## 2020-08-24 DIAGNOSIS — E782 Mixed hyperlipidemia: Secondary | ICD-10-CM

## 2020-08-24 DIAGNOSIS — E1169 Type 2 diabetes mellitus with other specified complication: Secondary | ICD-10-CM

## 2020-08-24 DIAGNOSIS — I700 Atherosclerosis of aorta: Secondary | ICD-10-CM

## 2020-08-24 DIAGNOSIS — E785 Hyperlipidemia, unspecified: Secondary | ICD-10-CM

## 2020-08-24 NOTE — Chronic Care Management (AMB) (Signed)
Chronic Care Management Pharmacy Note  08/24/2020 Name:  Emily Ryan MRN:  [id number] DOB:  [birth year]   Subjective: Emily Ryan is an 75 y.o. year old female who is a primary patient of Derrel Nip, Aris Everts, MD.  The CCM team was consulted for assistance with disease management and care coordination needs.    Engaged with patient by telephone for  medication question  in response to provider referral for pharmacy case management and/or care coordination services.   Consent to Services:  The patient was given information about Chronic Care Management services, agreed to services, and gave verbal consent prior to initiation of services.  Please see initial visit note for detailed documentation.   Patient Care Team: Crecencio Mc, MD as PCP - General (Internal Medicine) Minna Merritts, MD as Consulting Physician (Cardiology) Lin Landsman, MD as Consulting Physician (Gastroenterology) Michael Boston, MD as Consulting Physician (General Surgery) [name], RPH-CPP (Pharmacist) Objective:  Lab Results  Component Value Date   CREATININE 0.61 07/29/2020   CREATININE 0.62 05/27/2020   CREATININE 0.63 04/16/2020    Lab Results  Component Value Date   HGBA1C 8.0 (H) 07/29/2020   Last diabetic Eye exam:  Lab Results  Component Value Date/Time   HMDIABEYEEXA No Retinopathy 06/12/2018 12:00 AM    Last diabetic Foot exam:  Lab Results  Component Value Date/Time   HMDIABFOOTEX normal 01/27/2014 12:00 AM        Component Value Date/Time   CHOL 161 07/29/2020 0813   CHOL 155 01/08/2019 1122   TRIG 168.0 (H) 07/29/2020 0813   TRIG 218 (H) 01/08/2019 1122   HDL 47.20 07/29/2020 0813   CHOLHDL 3 07/29/2020 0813   VLDL 33.6 07/29/2020 0813   LDLCALC 80 07/29/2020 0813   LDLDIRECT 72.0 04/12/2018 0825    Hepatic Function Latest Ref Rng & Units 07/29/2020 05/27/2020 04/16/2020  Total Protein 6.0 - 8.3 g/dL 6.8 6.7 6.8  Albumin 3.5 - 5.2 g/dL 4.0 4.2 3.9   AST 0 - 37 U/L 24 22 22   ALT 0 - 35 U/L 36(H) 32 32  Alk Phosphatase 39 - 117 U/L 86 94 91  Total Bilirubin 0.2 - 1.2 mg/dL 0.5 0.6 0.7  Bilirubin, Direct 0.0 - 0.3 mg/dL - - -    Lab Results  Component Value Date/Time   TSH 3.11 01/14/2020 08:02 AM   TSH 1.62 02/04/2015 01:37 PM    CBC Latest Ref Rng & Units 07/29/2020 05/27/2020 02/19/2020  WBC 4.0 - 10.5 K/uL 6.0 6.2 5.7  Hemoglobin 12.0 - 15.0 g/dL 13.6 14.5 14.5  Hematocrit 36.0 - 46.0 % 40.2 42.3 42.2  Platelets 150.0 - 400.0 K/uL 290.0 263.0 239    Lab Results  Component Value Date/Time   VD25OH 35.87 07/29/2020 08:13 AM   VD25OH 34.28 11/29/2017 09:15 AM    Clinical ASCVD: No  The 10-year ASCVD risk score Mikey Bussing DC Jr., et al., 2013) is: 42.4%   Values used to calculate the score:     Age: 75 years     Sex: Female     Is Non-Hispanic African American: No     Diabetic: Yes     Tobacco smoker: No     Systolic Blood Pressure: 947 mmHg     Is BP treated: Yes     HDL Cholesterol: 47.2 mg/dL     Total Cholesterol: 161 mg/dL      Social History   Tobacco Use  Smoking Status Never  Smokeless Tobacco  Never  Tobacco Comments   father growing up   BP Readings from Last 3 Encounters:  07/30/20 (!) 142/68  06/12/20 (!) 150/70  06/12/20 (!) 163/81   Pulse Readings from Last 3 Encounters:  07/30/20 83  06/12/20 79  06/12/20 73   Wt Readings from Last 3 Encounters:  07/30/20 132 lb 6.4 oz (60.1 kg)  06/12/20 132 lb (59.9 kg)  06/12/20 132 lb (59.9 kg)    Assessment: Review of patient past medical history, allergies, medications, health status, including review of consultants reports, laboratory and other test data, was performed as part of comprehensive evaluation and provision of chronic care management services.   SDOH:  (Social Determinants of Health) assessments and interventions performed:    CCM Care Plan  Allergies  Allergen Reactions   Prednisone Hives    Medications Reviewed Today      Reviewed by [name], RPH-CPP (Pharmacist) on 08/18/20 at 1110  Med List Status: <None>   Medication Order Taking? Sig Documenting Provider Last Dose Status Informant  amLODipine (NORVASC) 2.5 MG tablet [id number]  Take 1 tablet (2.5 mg total) by mouth every evening. Crecencio Mc, MD  Active   Ascorbic Acid (VITAMIN C) 1000 MG tablet [id number] Yes Take 1,000 mg by mouth daily.  [provider] Taking Active Self  aspirin EC 81 MG tablet [id number] Yes Take 81 mg by mouth at bedtime. Swallow whole. [provider] Taking Active Self  Calcium Carbonate-Vitamin D3 600-400 MG-UNIT TABS [id number] Yes Take 1 tablet by mouth daily. [provider] Taking Active Self  clobetasol cream (TEMOVATE) 0.05 % [id number] Yes Apply 1 application topically 2 (two) times daily as needed (psoriasis). [provider] Taking Active Self  conjugated estrogens (PREMARIN) vaginal cream [id number] Yes Place 1 Applicatorful vaginally daily.  Patient taking differently: Place 1 Applicatorful vaginally daily as needed (dryness/irritation).   Rubie Maid, MD Taking Active   cyclobenzaprine (FLEXERIL) 5 MG tablet [id number] No Take 1 tablet (5 mg total) by mouth 3 (three) times daily as needed.  Patient not taking: Reported on 08/18/2020   Crecencio Mc, MD Not Taking Active   Dulaglutide (TRULICITY) 1.5 OB/0.9GG SOPN [id number] Yes Inject 1.5 mg into the skin once a week.  Patient taking differently: Inject 1.5 mg into the skin every Sunday.   Crecencio Mc, MD Taking Active   escitalopram (LEXAPRO) 5 MG tablet [id number] Yes Take 1 tablet (5 mg total) by mouth every evening. Crecencio Mc, MD Taking Active   ezetimibe (ZETIA) 10 MG tablet [id number] Yes Take 1 tablet (10 mg total) by mouth daily. Crecencio Mc, MD Taking Active   folic acid (FOLVITE) 1 MG tablet [id number] Yes Take 1 tablet (1 mg total) by mouth See admin instructions. Takes daily except on Saturday Crecencio Mc, MD Taking Active   glipiZIDE (GLUCOTROL XL) 10 MG 24 hr tablet [id number] Yes Take 1 tablet (10 mg total) by mouth daily with breakfast. Crecencio Mc, MD Taking Active   glucose blood (FREESTYLE LITE) test strip [id number] Yes Use as instructed to test blood sugar twice daily Crecencio Mc, MD Taking Active Self  hydrochlorothiazide (HYDRODIURIL) 12.5 MG tablet [id number] Yes Take 1 tablet (12.5 mg total) by mouth daily. Crecencio Mc, MD Taking Active Self  loratadine (CLARITIN) 10 MG tablet [id number] Yes Take 1 tablet (10 mg total) by mouth daily. Crecencio Mc, MD Taking Active   metFORMIN (GLUCOPHAGE XR) 750 MG 24 hr tablet [id number] Yes  Take 2 tablets (1,500 mg total) by mouth daily with breakfast. Crecencio Mc, MD Taking Active   methotrexate (RHEUMATREX) 2.5 MG tablet [id number] Yes Take 3 tablets (7.5 mg total) by mouth once a week. Caution:Chemotherapy. Protect from light. Crecencio Mc, MD Taking Active   montelukast (SINGULAIR) 10 MG tablet [id number] Yes Take 1 tablet (10 mg total) by mouth at bedtime. Crecencio Mc, MD Taking Active Self  Multiple Vitamin (MULTIVITAMIN) capsule [id number] Yes Take 1 capsule by mouth daily. [provider] Taking Active Self  omeprazole (PRILOSEC) 20 MG capsule [id number] Yes Take 1 capsule (20 mg total) by mouth daily. Crecencio Mc, MD Taking Active   Precision Thin Lancets MISC [id number] Yes Use as directed to check sugars twice daily Crecencio Mc, MD Taking Active Self  simvastatin (ZOCOR) 20 MG tablet [id number] Yes Take 1 tablet (20 mg total) by mouth at bedtime. Crecencio Mc, MD Taking Active Self  SUPREP BOWEL PREP KIT 17.5-3.13-1.6 GM/177ML SOLN [id number] No SMARTSIG:354 Milliliter(s) By Mouth Once  Patient not taking: Reported on 08/18/2020   [provider] Not Taking Active   telmisartan (MICARDIS) 80 MG tablet [id number] No Take 1 tablet (80 mg total) by mouth daily.  Patient not taking: Reported on  08/18/2020   Crecencio Mc, MD Not Taking Active            Med Note [name]   Tue Aug 18, 2020 11:10 AM) Completing losartan prescription first  traMADol (ULTRAM) 50 MG tablet [id number] No Take 1 tablet (50 mg total) by mouth every 6 (six) hours as needed (Breakthrough pain).  Patient not taking: No sig reported   Poggi, Marshall Cork, MD Not Taking Active   vitamin E 400 UNIT capsule [id number] Yes Take 400 Units by mouth daily. [provider] Taking Active Self            Patient Active Problem List   Diagnosis Date Noted   Lumbar degenerative disc disease 07/31/2020   Leg pain, posterior, left 05/27/2020   S/P shoulder replacement, right 04/24/2020   Status post reverse total shoulder replacement, right 04/06/2020   Anastomotic stricture of colorectal region 03/10/2020   Hemorrhage of colon due to diverticulosis 03/10/2020   Rotator cuff arthropathy, right 11/25/2019   Travel advice encounter 10/15/2019   Fall at home, initial encounter 10/14/2019   Atherosclerosis of aorta ([place]) 10/14/2019   Vaginal atrophy 08/20/2019   Dyspareunia in female 08/20/2019   Perineal rash in female 04/12/2019   Hearing loss 04/12/2019   Breast calcifications on mammogram 09/13/2018   Family history of Alzheimer's disease 08/13/2018   Macrocytosis without anemia 04/14/2018   Hx of colonic polyps    S/P partial colectomy 08/05/2017   Tubular adenoma of colon 08/03/2017   Overweight (BMI 25.0-29.9) 05/04/2016   Basal cell carcinoma of right forehead 08/28/2015   Patent foramen ovale with right to left shunt 08/28/2015   Postmenopausal estrogen deficiency 05/07/2015   DM type 2 with diabetic mixed hyperlipidemia ([place]) 02/08/2015   Uncontrolled type 2 diabetes mellitus ([place]) 02/08/2015   Allergic rhinitis 01/28/2013   Basal cell carcinoma of left side of nose 04/28/2012   Long-term use of high-risk medication 10/21/2011   Routine general medical examination at a health care  facility 07/18/2011   Guttate psoriasis 04/04/2011   Constipation 01/19/2011   Reflux esophagitis 01/19/2011   Diverticulosis of sigmoid colon 11/10/2010   White coat syndrome with diagnosis of hypertension 09/07/2010   Hyperlipidemia  associated with type 2 diabetes mellitus ([place]) 09/07/2010   Hyperlipidemia 09/07/2010    Immunization History  Administered Date(s) Administered   Fluad Quad(high Dose 65+) 10/03/2018   Influenza Split 10/19/2011   Influenza, High Dose Seasonal PF 10/01/2014, 10/06/2016, 09/06/2017   Influenza,inj,Quad PF,6+ Mos 10/31/2012, 09/26/2013, 08/26/2015   Influenza-Unspecified 10/07/2019   PFIZER(Purple Top)SARS-COV-2 Vaccination 02/12/2019, 03/05/2019, 10/09/2019, 04/08/2020   Pneumococcal Conjugate-13 01/28/2013   Pneumococcal Polysaccharide-23 11/02/2010, 05/04/2016   Tdap 11/16/2010   Zoster Recombinat (Shingrix) 06/25/2017, 09/06/2017   Zoster, Live 10/19/2007    Conditions to be addressed/monitored: HTN, HLD, and DMII  Care Plan : Medication Management  Updates made by [name], RPH-CPP since 08/24/2020 12:00 AM     Problem: Diabetes, HTN, HLD, psoriasis      Long-Range Goal: Disease Progression Prevention   Start Date: 08/18/2020  Recent Progress: On track  Priority: High  Note:   Current Barriers:  Unable to achieve control of diabetes   Pharmacist Clinical Goal(s):  Over the next 90 days, patient will achieve control of diabetes as evidenced by A1c  through collaboration with PharmD and provider.  Interventions: 1:1 collaboration with Crecencio Mc, MD regarding development and update of comprehensive plan of care as evidenced by provider attestation and co-signature Inter-disciplinary care team collaboration (see longitudinal plan of care) Comprehensive medication review performed; medication list updated in electronic medical record  SDOH: [employer] for Life. No medication cost concerns.   Health Maintenance: Due for  DM eye exam. Will discuss moving forward.   Diabetes: Uncontrolled; current treatment: metformin XR 1500 mg daily, Ozempic 0.25 mg weekly - to start this week glipizide 5 mg daily;  Previously recommended to continue current regimen at this time  Hypertension: Uncontrolled at last clinic; current treatment: amlodipine 2.5 mg daily, telmisartan 80 mg daily, HCTZ 12.5 mg daily Current home readings: will discuss after patient transitions to telmisartan Contacted to review what time she should take telmisartan. She has been taking losartan QPM. Advised to continue telmisartan QPM. Stop losartan.  Hyperlipidemia, Aortic Atherosclerosis: Could consider tighter control given DM + aortic atherosclerosis, as well as risk factor of inflammatory conditions; current treatment: rosuvastatin 10 mg daily Current antiplatelet regimen: aspirin 81 mg daily Patient called to review that she was to stop simvastatin and ezetimibe and start rosuvastatin.  Depression: Controlled per patient report; current regimen: escitalopram 5 mg daily Previously recommended to continue current regimen at this time  Psoriasis: Controlled per patient report; current regimen: methotrexate 7.5 mg once weekly, folic acid 1 mg daily Previously recommended to continue current regimen at this time along with collaboration with dermatology  Allergies: Controlled per patient report; current regimen: loratadine 10 mg daily, montelukast 10 mg daily Previously recommended to continue current regimen at this time  GERD: Controlled per patient report; current regimen: omeprazole 20 mg daily Previously recommended to continue current regimen at this time. Evaluate ability to reduce to PRN PPI or H2RA therapy moving forward.   Atrophic Vaginitis: Controlled per patient report; current regimen: premarin vaginal cream PRN Previously recommended to continue current regimen at this time.  Supplements: Vitamin D, Vitamin C, Calcium,  MVI  Patient Goals/Self-Care Activities Over the next 90 days, patient will:  - take medications as prescribed check glucose twice daily, document, and provide at future appointments check blood pressure perioidically, document, and provide at future appointments  Follow Up Plan: Face to Face appointment with care management team member scheduled for:  ~10 weeks w/ PCP visit  Medication Assistance: None required.  Patient affirms current coverage meets needs.  Patient's preferred pharmacy is:  [id number] [address], [place] [address] Phone: [phone] Fax: [phone]  [address] [place], [place] [address] [place] [address] Phone: [phone] Fax: [phone]   Follow Up:  Patient agrees to Care Plan and Follow-up.  Plan: Face to Face appointment with care management team member scheduled for: ~ 10 weeks  [name], PharmD, [place], [place] Clinical Pharmacist [employer] at [facility] [phone]

## 2020-08-24 NOTE — Patient Instructions (Signed)
Visit Information  PATIENT GOALS:  Goals Addressed               This Visit's Progress     Patient Stated     Medication Monitoring (pt-stated)        Patient Goals/Self-Care Activities Over the next 90 days, patient will:  - take medications as prescribed check glucose twice daily, document, and provide at future appointments check blood pressure perioidically, document, and provide at future appointments        Patient verbalizes understanding of instructions provided today and agrees to view in [place].   Plan: Face to Face appointment with care management team member scheduled for: ~ 10 weeks  [name], PharmD, [place], [place] Clinical Pharmacist [employer] at [facility] [phone]

## 2020-08-25 ENCOUNTER — Ambulatory Visit: Payer: Medicare Other | Admitting: Pharmacist

## 2020-08-25 ENCOUNTER — Other Ambulatory Visit: Payer: Self-pay | Admitting: Internal Medicine

## 2020-08-25 DIAGNOSIS — I10 Essential (primary) hypertension: Secondary | ICD-10-CM

## 2020-08-25 DIAGNOSIS — Z1231 Encounter for screening mammogram for malignant neoplasm of breast: Secondary | ICD-10-CM

## 2020-08-25 DIAGNOSIS — E785 Hyperlipidemia, unspecified: Secondary | ICD-10-CM

## 2020-08-25 DIAGNOSIS — E1169 Type 2 diabetes mellitus with other specified complication: Secondary | ICD-10-CM

## 2020-08-25 DIAGNOSIS — I700 Atherosclerosis of aorta: Secondary | ICD-10-CM

## 2020-08-25 NOTE — Patient Instructions (Signed)
Visit Information  PATIENT GOALS:  Goals Addressed               This Visit's Progress     Patient Stated     Medication Monitoring (pt-stated)        Patient Goals/Self-Care Activities Over the next 90 days, patient will:  - take medications as prescribed check glucose twice daily, document, and provide at future appointments check blood pressure perioidically, document, and provide at future appointments         Patient verbalizes understanding of instructions provided today and agrees to view in [place].   Plan: Face to Face appointment with care management team member scheduled for: ~ 10 weeks  [name], PharmD, [place], [place] Clinical Pharmacist [employer] at [facility] [phone]

## 2020-08-25 NOTE — Chronic Care Management (AMB) (Signed)
Chronic Care Management Pharmacy Note  08/25/2020 Name:  Emily Ryan MRN:  [id number] DOB:  1945-12-21    Subjective: Emily Ryan is an 75 y.o. year old female who is a primary patient of Derrel Nip, Aris Everts, MD.  The CCM team was consulted for assistance with disease management and care coordination needs.    Care coordination for  medication access  in response to provider referral for pharmacy case management and/or care coordination services.   Consent to Services:  The patient was given information about Chronic Care Management services, agreed to services, and gave verbal consent prior to initiation of services.  Please see initial visit note for detailed documentation.   Patient Care Team: Crecencio Mc, MD as PCP - General (Internal Medicine) Minna Merritts, MD as Consulting Physician (Cardiology) Lin Landsman, MD as Consulting Physician (Gastroenterology) Michael Boston, MD as Consulting Physician (General Surgery) [name], RPH-CPP (Pharmacist)   Objective:  Lab Results  Component Value Date   CREATININE 0.61 07/29/2020   CREATININE 0.62 05/27/2020   CREATININE 0.63 04/16/2020    Lab Results  Component Value Date   HGBA1C 8.0 (H) 07/29/2020   Last diabetic Eye exam:  Lab Results  Component Value Date/Time   HMDIABEYEEXA No Retinopathy 06/12/2018 12:00 AM    Last diabetic Foot exam:  Lab Results  Component Value Date/Time   HMDIABFOOTEX normal 01/27/2014 12:00 AM        Component Value Date/Time   CHOL 161 07/29/2020 0813   CHOL 155 01/08/2019 1122   TRIG 168.0 (H) 07/29/2020 0813   TRIG 218 (H) 01/08/2019 1122   HDL 47.20 07/29/2020 0813   CHOLHDL 3 07/29/2020 0813   VLDL 33.6 07/29/2020 0813   LDLCALC 80 07/29/2020 0813   LDLDIRECT 72.0 04/12/2018 0825    Hepatic Function Latest Ref Rng & Units 07/29/2020 05/27/2020 04/16/2020  Total Protein 6.0 - 8.3 g/dL 6.8 6.7 6.8  Albumin 3.5 - 5.2 g/dL 4.0 4.2 3.9  AST 0 -  37 U/L 24 22 22   ALT 0 - 35 U/L 36(H) 32 32  Alk Phosphatase 39 - 117 U/L 86 94 91  Total Bilirubin 0.2 - 1.2 mg/dL 0.5 0.6 0.7  Bilirubin, Direct 0.0 - 0.3 mg/dL - - -    Lab Results  Component Value Date/Time   TSH 3.11 01/14/2020 08:02 AM   TSH 1.62 02/04/2015 01:37 PM    CBC Latest Ref Rng & Units 07/29/2020 05/27/2020 02/19/2020  WBC 4.0 - 10.5 K/uL 6.0 6.2 5.7  Hemoglobin 12.0 - 15.0 g/dL 13.6 14.5 14.5  Hematocrit 36.0 - 46.0 % 40.2 42.3 42.2  Platelets 150.0 - 400.0 K/uL 290.0 263.0 239    Lab Results  Component Value Date/Time   VD25OH 35.87 07/29/2020 08:13 AM   VD25OH 34.28 11/29/2017 09:15 AM    Clinical ASCVD: No  The 10-year ASCVD risk score Mikey Bussing DC Jr., et al., 2013) is: 42.4%   Values used to calculate the score:     Age: 50 years     Sex: Female     Is Non-Hispanic African American: No     Diabetic: Yes     Tobacco smoker: No     Systolic Blood Pressure: 837 mmHg     Is BP treated: Yes     HDL Cholesterol: 47.2 mg/dL     Total Cholesterol: 161 mg/dL     Social History   Tobacco Use  Smoking Status Never  Smokeless Tobacco Never  Tobacco Comments   father growing up   BP Readings from Last 3 Encounters:  07/30/20 (!) 142/68  06/12/20 (!) 150/70  06/12/20 (!) 163/81   Pulse Readings from Last 3 Encounters:  07/30/20 83  06/12/20 79  06/12/20 73   Wt Readings from Last 3 Encounters:  07/30/20 132 lb 6.4 oz (60.1 kg)  06/12/20 132 lb (59.9 kg)  06/12/20 132 lb (59.9 kg)    Assessment: Review of patient past medical history, allergies, medications, health status, including review of consultants reports, laboratory and other test data, was performed as part of comprehensive evaluation and provision of chronic care management services.   SDOH:  (Social Determinants of Health) assessments and interventions performed:  SDOH Interventions    Flowsheet Row Most Recent Value  SDOH Interventions   Financial Strain Interventions Intervention Not  Indicated       CCM Care Plan  Allergies  Allergen Reactions   Prednisone Hives    Medications Reviewed Today     Reviewed by [name], RPH-CPP (Pharmacist) on 08/18/20 at 1110  Med List Status: <None>   Medication Order Taking? Sig Documenting Provider Last Dose Status Informant  amLODipine (NORVASC) 2.5 MG tablet [id number]  Take 1 tablet (2.5 mg total) by mouth every evening. Crecencio Mc, MD  Active   Ascorbic Acid (VITAMIN C) 1000 MG tablet [id number] Yes Take 1,000 mg by mouth daily.  [provider] Taking Active Self  aspirin EC 81 MG tablet [id number] Yes Take 81 mg by mouth at bedtime. Swallow whole. [provider] Taking Active Self  Calcium Carbonate-Vitamin D3 600-400 MG-UNIT TABS [id number] Yes Take 1 tablet by mouth daily. [provider] Taking Active Self  clobetasol cream (TEMOVATE) 0.05 % [id number] Yes Apply 1 application topically 2 (two) times daily as needed (psoriasis). [provider] Taking Active Self  conjugated estrogens (PREMARIN) vaginal cream [id number] Yes Place 1 Applicatorful vaginally daily.  Patient taking differently: Place 1 Applicatorful vaginally daily as needed (dryness/irritation).   Rubie Maid, MD Taking Active   cyclobenzaprine (FLEXERIL) 5 MG tablet [id number] No Take 1 tablet (5 mg total) by mouth 3 (three) times daily as needed.  Patient not taking: Reported on 08/18/2020   Crecencio Mc, MD Not Taking Active   Dulaglutide (TRULICITY) 1.5 SW/5.4OE SOPN [id number] Yes Inject 1.5 mg into the skin once a week.  Patient taking differently: Inject 1.5 mg into the skin every Sunday.   Crecencio Mc, MD Taking Active   escitalopram (LEXAPRO) 5 MG tablet [id number] Yes Take 1 tablet (5 mg total) by mouth every evening. Crecencio Mc, MD Taking Active   ezetimibe (ZETIA) 10 MG tablet [id number] Yes Take 1 tablet (10 mg total) by mouth daily. Crecencio Mc, MD Taking Active   folic acid (FOLVITE)  1 MG tablet [id number] Yes Take 1 tablet (1 mg total) by mouth See admin instructions. Takes daily except on Saturday Crecencio Mc, MD Taking Active   glipiZIDE (GLUCOTROL XL) 10 MG 24 hr tablet [id number] Yes Take 1 tablet (10 mg total) by mouth daily with breakfast. Crecencio Mc, MD Taking Active   glucose blood (FREESTYLE LITE) test strip [id number] Yes Use as instructed to test blood sugar twice daily Crecencio Mc, MD Taking Active Self  hydrochlorothiazide (HYDRODIURIL) 12.5 MG tablet [id number] Yes Take 1 tablet (12.5 mg total) by mouth daily. Crecencio Mc, MD Taking Active Self  loratadine (CLARITIN) 10 MG tablet [id number] Yes Take 1 tablet (10  mg total) by mouth daily. Crecencio Mc, MD Taking Active   metFORMIN (GLUCOPHAGE XR) 750 MG 24 hr tablet [id number] Yes Take 2 tablets (1,500 mg total) by mouth daily with breakfast. Crecencio Mc, MD Taking Active   methotrexate (RHEUMATREX) 2.5 MG tablet [id number] Yes Take 3 tablets (7.5 mg total) by mouth once a week. Caution:Chemotherapy. Protect from light. Crecencio Mc, MD Taking Active   montelukast (SINGULAIR) 10 MG tablet [id number] Yes Take 1 tablet (10 mg total) by mouth at bedtime. Crecencio Mc, MD Taking Active Self  Multiple Vitamin (MULTIVITAMIN) capsule [id number] Yes Take 1 capsule by mouth daily. [provider] Taking Active Self  omeprazole (PRILOSEC) 20 MG capsule [id number] Yes Take 1 capsule (20 mg total) by mouth daily. Crecencio Mc, MD Taking Active   Precision Thin Lancets MISC [id number] Yes Use as directed to check sugars twice daily Crecencio Mc, MD Taking Active Self  simvastatin (ZOCOR) 20 MG tablet [id number] Yes Take 1 tablet (20 mg total) by mouth at bedtime. Crecencio Mc, MD Taking Active Self  SUPREP BOWEL PREP KIT 17.5-3.13-1.6 GM/177ML SOLN [id number] No SMARTSIG:354 Milliliter(s) By Mouth Once  Patient not taking: Reported on 08/18/2020   [provider] Not Taking Active    telmisartan (MICARDIS) 80 MG tablet [id number] No Take 1 tablet (80 mg total) by mouth daily.  Patient not taking: Reported on 08/18/2020   Crecencio Mc, MD Not Taking Active            Med Note [name]   Tue Aug 18, 2020 11:10 AM) Completing losartan prescription first  traMADol (ULTRAM) 50 MG tablet [id number] No Take 1 tablet (50 mg total) by mouth every 6 (six) hours as needed (Breakthrough pain).  Patient not taking: No sig reported   Poggi, Marshall Cork, MD Not Taking Active   vitamin E 400 UNIT capsule [id number] Yes Take 400 Units by mouth daily. [provider] Taking Active Self            Patient Active Problem List   Diagnosis Date Noted   Lumbar degenerative disc disease 07/31/2020   Leg pain, posterior, left 05/27/2020   S/P shoulder replacement, right 04/24/2020   Status post reverse total shoulder replacement, right 04/06/2020   Anastomotic stricture of colorectal region 03/10/2020   Hemorrhage of colon due to diverticulosis 03/10/2020   Rotator cuff arthropathy, right 11/25/2019   Travel advice encounter 10/15/2019   Fall at home, initial encounter 10/14/2019   Atherosclerosis of aorta ([place]) 10/14/2019   Vaginal atrophy 08/20/2019   Dyspareunia in female 08/20/2019   Perineal rash in female 04/12/2019   Hearing loss 04/12/2019   Breast calcifications on mammogram 09/13/2018   Family history of Alzheimer's disease 08/13/2018   Macrocytosis without anemia 04/14/2018   Hx of colonic polyps    S/P partial colectomy 08/05/2017   Tubular adenoma of colon 08/03/2017   Overweight (BMI 25.0-29.9) 05/04/2016   Basal cell carcinoma of right forehead 08/28/2015   Patent foramen ovale with right to left shunt 08/28/2015   Postmenopausal estrogen deficiency 05/07/2015   DM type 2 with diabetic mixed hyperlipidemia ([place]) 02/08/2015   Uncontrolled type 2 diabetes mellitus ([place]) 02/08/2015   Allergic rhinitis 01/28/2013   Basal cell carcinoma of left side  of nose 04/28/2012   Long-term use of high-risk medication 10/21/2011   Routine general medical examination at a health care facility 07/18/2011   Guttate psoriasis 04/04/2011   Constipation 01/19/2011  Reflux esophagitis 01/19/2011   Diverticulosis of sigmoid colon 11/10/2010   White coat syndrome with diagnosis of hypertension 09/07/2010   Hyperlipidemia associated with type 2 diabetes mellitus ([place]) 09/07/2010   Hyperlipidemia 09/07/2010    Immunization History  Administered Date(s) Administered   Fluad Quad(high Dose 65+) 10/03/2018   Influenza Split 10/19/2011   Influenza, High Dose Seasonal PF 10/01/2014, 10/06/2016, 09/06/2017   Influenza,inj,Quad PF,6+ Mos 10/31/2012, 09/26/2013, 08/26/2015   Influenza-Unspecified 10/07/2019   PFIZER(Purple Top)SARS-COV-2 Vaccination 02/12/2019, 03/05/2019, 10/09/2019, 04/08/2020   Pneumococcal Conjugate-13 01/28/2013   Pneumococcal Polysaccharide-23 11/02/2010, 05/04/2016   Tdap 11/16/2010   Zoster Recombinat (Shingrix) 06/25/2017, 09/06/2017   Zoster, Live 10/19/2007    Conditions to be addressed/monitored: HTN, HLD, and DMII  Care Plan : Medication Management  Updates made by [name], RPH-CPP since 08/25/2020 12:00 AM     Problem: Diabetes, HTN, HLD, psoriasis      Long-Range Goal: Disease Progression Prevention   Start Date: 08/18/2020  This Visit's Progress: On track  Recent Progress: On track  Priority: High  Note:   Current Barriers:  Unable to achieve control of diabetes   Pharmacist Clinical Goal(s):  Over the next 90 days, patient will achieve control of diabetes as evidenced by A1c  through collaboration with PharmD and provider.  Interventions: 1:1 collaboration with Crecencio Mc, MD regarding development and update of comprehensive plan of care as evidenced by provider attestation and co-signature Inter-disciplinary care team collaboration (see longitudinal plan of care) Comprehensive medication  review performed; medication list updated in electronic medical record  SDOH: [employer] for Life. No medication cost concerns.   Health Maintenance: Due for DM eye exam. Will discuss moving forward.   Diabetes: Uncontrolled; current treatment: metformin XR 1500 mg daily, Ozempic 0.25 mg weekly - to start this week; glipizide 5 mg daily;  Calls today to report that she called her pharmacy insurance and that [place] requires a PA, but that long term, she would like to receive 90 day supplies through [facility]. Completed PA for Ozempic via Cover My Meds. Will follow for result.   Hypertension: Uncontrolled at last clinic; current treatment: amlodipine 2.5 mg daily, telmisartan 80 mg daily, HCTZ 12.5 mg daily Current home readings: will discuss after patient transitions to telmisartan Previously recommended to continue current regimen  Hyperlipidemia, Aortic Atherosclerosis: Could consider tighter control given DM + aortic atherosclerosis, as well as risk factor of inflammatory conditions; current treatment: rosuvastatin 10 mg daily Current antiplatelet regimen: aspirin 81 mg daily Previously recommended to continue current regimen  Depression: Controlled per patient report; current regimen: escitalopram 5 mg daily Previously recommended to continue current regimen at this time  Psoriasis: Controlled per patient report; current regimen: methotrexate 7.5 mg once weekly, folic acid 1 mg daily Previously recommended to continue current regimen at this time along with collaboration with dermatology  Allergies: Controlled per patient report; current regimen: loratadine 10 mg daily, montelukast 10 mg daily Previously recommended to continue current regimen at this time  GERD: Controlled per patient report; current regimen: omeprazole 20 mg daily Previously recommended to continue current regimen at this time. Evaluate ability to reduce to PRN PPI or H2RA therapy moving forward.    Atrophic Vaginitis: Controlled per patient report; current regimen: premarin vaginal cream PRN Previously recommended to continue current regimen at this time.  Supplements: Vitamin D, Vitamin C, Calcium, MVI  Patient Goals/Self-Care Activities Over the next 90 days, patient will:  - take medications as prescribed check glucose twice daily, document,  and provide at future appointments check blood pressure perioidically, document, and provide at future appointments  Follow Up Plan: Face to Face appointment with care management team member scheduled for:  ~10 weeks w/ PCP visit       Medication Assistance: None required.  Patient affirms current coverage meets needs.  Patient's preferred pharmacy is:  [id number] [address], [place] [address] Phone: [phone] Fax: [phone]  [facility] [place] [place] [address] Phone: [phone] Fax: [phone]    Follow Up:  Patient agrees to Care Plan and Follow-up.  Plan: Face to Face appointment with care management team member scheduled for: ~ 10 weeks  [name], PharmD, [place], [place] Clinical Pharmacist [employer] at [facility] [phone]

## 2020-08-26 ENCOUNTER — Ambulatory Visit (INDEPENDENT_AMBULATORY_CARE_PROVIDER_SITE_OTHER): Payer: Medicare Other

## 2020-08-26 VITALS — Ht 59.0 in | Wt 132.0 lb

## 2020-08-26 DIAGNOSIS — Z0000 Encounter for general adult medical examination without abnormal findings: Secondary | ICD-10-CM | POA: Diagnosis not present

## 2020-08-26 NOTE — Patient Instructions (Signed)
  Ms. Guenette , Thank you for taking time to come for your Medicare Wellness Visit. I appreciate your ongoing commitment to your health goals. Please review the following plan we discussed and let me know if I can assist you in the future.   These are the goals we discussed:  Goals       Patient Stated     Medication Monitoring (pt-stated)      Patient Goals/Self-Care Activities Over the next 90 days, patient will:  - take medications as prescribed check glucose twice daily, document, and provide at future appointments check blood pressure perioidically, document, and provide at future appointments       Other     Follow up with pcp as needed        This is a list of the screening recommended for you and due dates:  Health Maintenance  Topic Date Due   COVID-19 Vaccine (5 - Booster for Pfizer series) 08/08/2020   Flu Shot  08/03/2020   Eye exam for diabetics  08/26/2020*   Mammogram  09/24/2020   Complete foot exam   10/13/2020   Colon Cancer Screening  11/02/2020   Tetanus Vaccine  11/15/2020   Hemoglobin A1C  01/29/2021   DEXA scan (bone density measurement)  Completed   Hepatitis C Screening: USPSTF Recommendation to screen - Ages 45-79 yo.  Completed   Pneumonia vaccines  Completed   Zoster (Shingles) Vaccine  Completed   HPV Vaccine  Aged Out  *Topic was postponed. The date shown is not the original due date.

## 2020-08-26 NOTE — Progress Notes (Addendum)
 Subjective:   Emily Ryan is a 74 y.o. female who presents for Medicare Annual (Subsequent) preventive examination.  Review of Systems    No ROS.  Medicare Wellness Virtual Visit.  Visual/audio telehealth visit, UTA vital signs.   See social history for additional risk factors.   Cardiac Risk Factors include: advanced age (>55men, >65 women);diabetes mellitus;hypertension     Objective:    Today's Vitals   08/26/20 1316  Weight: 132 lb (59.9 kg)  Height: 4' 11" (1.499 m)   Body mass index is 26.66 kg/m.  Advanced Directives 08/26/2020 06/12/2020 06/12/2020 02/19/2020 08/26/2019 02/19/2018 02/12/2018  Does Patient Have a Medical Advance Directive? No No No No Yes No No  Does patient want to make changes to medical advance directive? - - - - - - -  Would patient like information on creating a medical advance directive? No - Patient declined - - No - Patient declined - No - Patient declined No - Patient declined    Current Medications (verified) Outpatient Encounter Medications as of 08/26/2020  Medication Sig   amLODipine (NORVASC) 2.5 MG tablet Take 1 tablet (2.5 mg total) by mouth every evening.   Ascorbic Acid (VITAMIN C) 1000 MG tablet Take 1,000 mg by mouth daily.    aspirin EC 81 MG tablet Take 81 mg by mouth at bedtime. Swallow whole.   Calcium Carbonate-Vitamin D3 600-400 MG-UNIT TABS Take 1 tablet by mouth daily.   clobetasol cream (TEMOVATE) 0.05 % Apply 1 application topically 2 (two) times daily as needed (psoriasis).   conjugated estrogens (PREMARIN) vaginal cream Place 1 Applicatorful vaginally daily. (Patient taking differently: Place 1 Applicatorful vaginally daily as needed (dryness/irritation).)   cyclobenzaprine (FLEXERIL) 5 MG tablet Take 1 tablet (5 mg total) by mouth 3 (three) times daily as needed. (Patient not taking: Reported on 08/18/2020)   escitalopram (LEXAPRO) 5 MG tablet Take 1 tablet (5 mg total) by mouth every evening.   folic acid (FOLVITE) 1 MG  tablet Take 1 tablet (1 mg total) by mouth See admin instructions. Takes daily except on Saturday   glipiZIDE (GLUCOTROL) 5 MG tablet Take 1 tablet (5 mg total) by mouth daily before breakfast.   glucose blood (FREESTYLE LITE) test strip Use as instructed to test blood sugar twice daily   hydrochlorothiazide (HYDRODIURIL) 12.5 MG tablet Take 1 tablet (12.5 mg total) by mouth daily.   loratadine (CLARITIN) 10 MG tablet Take 1 tablet (10 mg total) by mouth daily.   metFORMIN (GLUCOPHAGE XR) 750 MG 24 hr tablet Take 2 tablets (1,500 mg total) by mouth daily with breakfast.   methotrexate (RHEUMATREX) 2.5 MG tablet Take 3 tablets (7.5 mg total) by mouth once a week. Caution:Chemotherapy. Protect from light.   montelukast (SINGULAIR) 10 MG tablet Take 1 tablet (10 mg total) by mouth at bedtime.   Multiple Vitamin (MULTIVITAMIN) capsule Take 1 capsule by mouth daily.   omeprazole (PRILOSEC) 20 MG capsule Take 1 capsule (20 mg total) by mouth daily.   Precision Thin Lancets MISC Use as directed to check sugars twice daily   rosuvastatin (CRESTOR) 10 MG tablet Take 1 tablet (10 mg total) by mouth daily.   Semaglutide,0.25 or 0.5MG/DOS, (OZEMPIC, 0.25 OR 0.5 MG/DOSE,) 2 MG/1.5ML SOPN Inject 0.25 mg weekly for 2 weeks then increase to 0.5 mg weekly   SUPREP BOWEL PREP KIT 17.5-3.13-1.6 GM/177ML SOLN SMARTSIG:354 Milliliter(s) By Mouth Once (Patient not taking: Reported on 08/18/2020)   telmisartan (MICARDIS) 80 MG tablet Take 1 tablet (80 mg   total) by mouth daily.   traMADol (ULTRAM) 50 MG tablet Take 1 tablet (50 mg total) by mouth every 6 (six) hours as needed (Breakthrough pain). (Patient not taking: No sig reported)   vitamin E 400 UNIT capsule Take 400 Units by mouth daily.   No facility-administered encounter medications on file as of 08/26/2020.    Allergies (verified) Prednisone   History: Past Medical History:  Diagnosis Date   Complicated pregnancy    1st pregnancy complicated by post  operative hemorrhage and 2ng complicated by epidural   Diabetes mellitus    Diverticulosis of colon (without mention of hemorrhage)    Fracture of malleolus of right ankle, closed, with routine healing, subsequent encounter 12/09/2017   GERD (gastroesophageal reflux disease)    Guttate psoriasis    Hyperlipidemia    Hypertension    Menopausal disorder    Rectal stricture    Skin cancer    Past Surgical History:  Procedure Laterality Date   ABDOMINAL HYSTERECTOMY     APPENDECTOMY  Dec 2012   BLADDER REPAIR  1991   lift    COLON RESECTION SIGMOID N/A 02/19/2018   Procedure: LAPAROSCOPIC SIGMOID COLECTOMY- POSSIBLE OPEN;  Surgeon: Piscoya, Jose, MD;  Location: [facility];  Service: General;  Laterality: N/A;   COLONOSCOPY WITH PROPOFOL N/A 07/17/2017   Procedure: COLONOSCOPY WITH PROPOFOL;  Surgeon: Vanga, Rohini Reddy, MD;  Location: [facility];  Service: Gastroenterology;  Laterality: N/A;   COLONOSCOPY WITH PROPOFOL N/A 11/02/2017   Procedure: COLONOSCOPY WITH PROPOFOL;  Surgeon: Vanga, Rohini Reddy, MD;  Location: [facility];  Service: Gastroenterology;  Laterality: N/A;   CYSTOCELE REPAIR     CYSTOSCOPY W/ URETERAL STENT PLACEMENT Bilateral 02/19/2018   Procedure: CYSTOSCOPY WITH STENT PLACEMENT GOING 1ST;  Surgeon: Sninsky, Brian C, MD;  Location: [facility];  Service: Urology;  Laterality: Bilateral;   FLEXIBLE SIGMOIDOSCOPY N/A 07/31/2017   Procedure: FLEXIBLE SIGMOIDOSCOPY;  Surgeon: Vanga, Rohini Reddy, MD;  Location: [facility];  Service: Gastroenterology;  Laterality: N/A;   HERNIA REPAIR     MOHS SURGERY N/A 05/2015   Face   PARTIAL COLECTOMY  Nov 2012   left, secondary to diverticular perf   RECTOCELE REPAIR     REVERSE SHOULDER ARTHROPLASTY Right 02/26/2020   Procedure: REVERSE SHOULDER ARTHROPLASTY;  Surgeon: Poggi, John J, MD;  Location: [facility];  Service: Orthopedics;  Laterality: Right;   SEPTOPLASTY  1969   TEE WITHOUT CARDIOVERSION N/A 11/24/2015    Procedure: TRANSESOPHAGEAL ECHOCARDIOGRAM (TEE);  Surgeon: Timothy J Gollan, MD;  Location: [facility];  Service: Cardiovascular;  Laterality: N/A;   TONSILLECTOMY  1953   VARICOSE VEIN SURGERY  1974   right leg    Family History  Problem Relation Age of Onset   Heart Problems Mother    Macular degeneration Mother    Cancer Father    Diabetes Sister    Dementia Sister    Diabetes Brother    Stroke Brother    Diabetes Brother    Melanoma Brother    Breast cancer Maternal Aunt    Breast cancer Paternal Aunt    Cancer Maternal Grandmother        colon ca   Lung cancer Cousin    Lung disease Neg Hx    Rheumatologic disease Neg Hx    Social History   Socioeconomic History   Marital status: Married    Spouse name: Not on file   Number of children: Not on file   Years of education: Not on   file   Highest education level: Not on file  Occupational History   Not on file  Tobacco Use   Smoking status: Never   Smokeless tobacco: Never   Tobacco comments:    father growing up  Vaping Use   Vaping Use: Never used  Substance and Sexual Activity   Alcohol use: Yes    Comment: Occasional   Drug use: No   Sexual activity: Yes    Birth control/protection: Post-menopausal  Other Topics Concern   Not on file  Social History Narrative   [facility] (11/14/16):   She is originally from [place]. Previously has worked as a Network engineer, in [employer], & in [employer]. Her husband was in [employer] and they traveled extensively. She lived in [place], [place], [place], [place], [place], [place]. She has 2 cats currently. Remote parakeet exposure as a child. No mold exposure. No recent hot tub exposure.    Social Determinants of Health   Financial Resource Strain: Low Risk    Difficulty of Paying Living Expenses: Not hard at all  Food Insecurity: No Food Insecurity   Worried About Charity fundraiser in the Last Year: Never true   [place] in the Last Year: Never true  Transportation Needs: No  Transportation Needs   Lack of Transportation (Medical): No   Lack of Transportation (Non-Medical): No  Physical Activity: Sufficiently Active   Days of Exercise per Week: 4 days   Minutes of Exercise per Session: 90 min  Stress: No Stress Concern Present   Feeling of Stress : Not at all  Social Connections: Socially Integrated   Frequency of Communication with Friends and Family: More than three times a week   Frequency of Social Gatherings with Friends and Family: More than three times a week   Attends Religious Services: More than 4 times per year   Active Member of [employer] or Organizations: Yes   Attends Music therapist: Not on file   Marital Status: Married    Tobacco Counseling Counseling given: Not Answered Tobacco comments: father growing up   Clinical Intake:  Pre-visit preparation completed: Yes        Diabetes: Yes (Followed by pcp)  How often do you need to have someone help you when you read instructions, pamphlets, or other written materials from your doctor or pharmacy?: 1 - Never  Nutrition Risk Assessment: Has the patient had any N/V/D within the last 2 months?  No  Does the patient have any non-healing wounds?  No  Has the patient had any unintentional weight loss or weight gain?  No   Is the patient seen by Chronic Care Management for management of their diabetes?  Yes    Interpreter Needed?: No      Activities of Daily Living In your present state of health, do you have any difficulty performing the following activities: 08/26/2020 02/19/2020  Hearing? Y N  Comment Hearing aids -  Vision? N -  Difficulty concentrating or making decisions? N N  Walking or climbing stairs? N N  Dressing or bathing? N N  Doing errands, shopping? N N  Preparing Food and eating ? N -  Using the Toilet? N -  In the past six months, have you accidently leaked urine? N -  Do you have problems with loss of bowel control? N -  Managing your Medications?  N -  Managing your Finances? N -  Housekeeping or managing your Housekeeping? N -  Some recent data might be hidden    Patient Care Team: Tullo, Teresa L, MD as PCP - General (Internal Medicine) Gollan, Timothy J, MD as Consulting Physician (Cardiology) Vanga, Rohini Reddy, MD as Consulting Physician (Gastroenterology) Gross, Steven, MD as Consulting Physician (General Surgery) Travis, Catherine E, RPH-CPP (Pharmacist)  Indicate any recent Medical Services you may have received from other than [facility] providers in the past year (date may be approximate).     Assessment:   This is a routine wellness examination for Alexandrya.  I connected with Catori today by telephone and verified that I am speaking with the correct person using two identifiers. Location patient: home Location provider: work Persons participating in the virtual visit: patient, nurse.    I discussed the limitations, risks, security and privacy concerns of performing an evaluation and management service by telephone and the availability of in person appointments. The patient expressed understanding and verbally consented to this telephonic visit.    Interactive audio and video telecommunications were attempted between this provider and patient, however failed, due to patient having technical difficulties OR patient did not have access to video capability.  We continued and completed visit with audio only.  Some vital signs may be absent or patient reported.   Hearing/Vision screen Hearing Screening - Comments:: Patient is able to hear conversational tones without difficulty.  No issues reported. Vision Screening - Comments:: Wears corrective lenses They have not seen their ophthalmologist in the last 12 months. Next appointment scheduled 01/2021.    Dietary issues and exercise activities discussed: Current Exercise Habits: Home exercise routine, Type of exercise: walking, Intensity: Moderate Healthy diet Good water  intake   Goals Addressed             This Visit's Progress    COMPLETED: Healthy diet       Maintain healthy lifestyle       Stay active       Depression Screen PHQ 2/9 Scores 08/26/2020 07/30/2020 06/10/2020 05/27/2020 04/22/2020 01/17/2020 08/26/2019  PHQ - 2 Score 0 0 0 0 0 0 0  PHQ- 9 Score - - - - 0 - -    Fall Risk Fall Risk  08/26/2020 07/30/2020 06/10/2020 05/27/2020 04/22/2020  Falls in the past year? 1 1 0 0 0  Number falls in past yr: 0 0 0 0 -  Injury with Fall? - 0 0 0 -  Comment - - - - -  Risk for fall due to : - - - - -  Follow up Falls evaluation completed Falls evaluation completed Falls evaluation completed Falls evaluation completed Falls evaluation completed    FALL RISK PREVENTION PERTAINING TO THE HOME: Adequate lighting in your home to reduce risk of falls? Yes   ASSISTIVE DEVICES UTILIZED TO PREVENT FALLS: Life alert? No  Use of a cane, walker or w/c? No   TIMED UP AND GO: Was the test performed? No .   Cognitive Function: Patient is alert and oriented x3.  Denies difficulty focusing, making decisions, memory loss.  Enjoys word games.  MMSE/6CIT deferred. Normal by direct communication/observation.  MMSE - Mini Mental State Exam 09/06/2017 08/25/2016 08/26/2015  Orientation to time 5 5 5  Orientation to Place 5 5 5  Registration 3 3 3  Attention/ Calculation 5 5 5  Recall 3 3 3  Language- name 2 objects 2 2 2  Language- repeat 1 1 1  Language- follow 3 step command 3 3 3  Language- read & follow direction 1   1 1  Write a sentence 1 1 1  Copy design 1 1 1  Total score 30 30 30       Immunizations Immunization History  Administered Date(s) Administered   Fluad Quad(high Dose 65+) 10/03/2018   Influenza Split 10/19/2011   Influenza, High Dose Seasonal PF 10/01/2014, 10/06/2016, 09/06/2017   Influenza,inj,Quad PF,6+ Mos 10/31/2012, 09/26/2013, 08/26/2015   Influenza-Unspecified 10/07/2019   PFIZER(Purple Top)SARS-COV-2 Vaccination 02/12/2019,  03/05/2019, 10/09/2019, 04/08/2020   Pneumococcal Conjugate-13 01/28/2013   Pneumococcal Polysaccharide-23 11/02/2010, 05/04/2016   Tdap 11/16/2010   Zoster Recombinat (Shingrix) 06/25/2017, 09/06/2017   Zoster, Live 10/19/2007   Health Maintenance Health Maintenance  Topic Date Due   COVID-19 Vaccine (5 - Booster for Pfizer series) 08/08/2020   INFLUENZA VACCINE  08/03/2020   OPHTHALMOLOGY EXAM  08/26/2020 (Originally 03/17/2020)   MAMMOGRAM  09/24/2020   FOOT EXAM  10/13/2020   COLONOSCOPY (Pts 45-49yrs Insurance coverage will need to be confirmed)  11/02/2020   TETANUS/TDAP  11/15/2020   HEMOGLOBIN A1C  01/29/2021   DEXA SCAN  Completed   Hepatitis C Screening  Completed   PNA vac Low Risk Adult  Completed   Zoster Vaccines- Shingrix  Completed   HPV VACCINES  Aged Out   Colorectal cancer screening: Type of screening: Colonoscopy. Completed 11/02/17. Repeat every 3 years. Scheduled 10/15/20.   Mammogram- scheduled 10/05/20.   Lung Cancer Screening: (Low Dose CT Chest recommended if Age 55-80 years, 30 pack-year currently smoking OR have quit w/in 15years.) does not qualify.   Vision Screening: Recommended annual ophthalmology exams for early detection of glaucoma and other disorders of the eye.  Dental Screening: Recommended annual dental exams for proper oral hygiene  Community Resource Referral / Chronic Care Management: CRR required this visit?  No   CCM required this visit?  No      Plan:   Keep all routine maintenance appointments.   I have personally reviewed and noted the following in the patient's chart:   Medical and social history Use of alcohol, tobacco or illicit drugs  Current medications and supplements including opioid prescriptions. Not taking opioid.  Functional ability and status Nutritional status Physical activity Advanced directives List of other physicians Hospitalizations, surgeries, and ER visits in previous 12 months Vitals Screenings  to include cognitive, depression, and falls Referrals and appointments  In addition, I have reviewed and discussed with patient certain preventive protocols, quality metrics, and best practice recommendations. A written personalized care plan for preventive services as well as general preventive health recommendations were provided to patient via mychart.     OBrien-Blaney,  L, LPN   08/26/2020    I have reviewed the above information and agree with above.   Teresa Tullo, MD       

## 2020-08-28 ENCOUNTER — Ambulatory Visit: Payer: Medicare Other | Admitting: Pharmacist

## 2020-08-28 DIAGNOSIS — I700 Atherosclerosis of aorta: Secondary | ICD-10-CM

## 2020-08-28 DIAGNOSIS — Z96611 Presence of right artificial shoulder joint: Secondary | ICD-10-CM | POA: Diagnosis not present

## 2020-08-28 DIAGNOSIS — M12811 Other specific arthropathies, not elsewhere classified, right shoulder: Secondary | ICD-10-CM | POA: Diagnosis not present

## 2020-08-28 DIAGNOSIS — E785 Hyperlipidemia, unspecified: Secondary | ICD-10-CM

## 2020-08-28 DIAGNOSIS — E1169 Type 2 diabetes mellitus with other specified complication: Secondary | ICD-10-CM

## 2020-08-28 NOTE — Chronic Care Management (AMB) (Addendum)
Chronic Care Management Pharmacy Note  08/28/2020 Name:  Emily Ryan MRN:  [id number] DOB:  06-09-1945  Subjective: Emily Ryan is an 75 y.o. year old female who is a primary patient of Derrel Nip, Aris Everts, MD.  The CCM team was consulted for assistance with disease management and care coordination needs.    Engaged with patient by telephone for medication access in response to provider referral for pharmacy case management and/or care coordination services.   Consent to Services:  The patient was given information about Chronic Care Management services, agreed to services, and gave verbal consent prior to initiation of services.  Please see initial visit note for detailed documentation.   Patient Care Team: Crecencio Mc, MD as PCP - General (Internal Medicine) Minna Merritts, MD as Consulting Physician (Cardiology) Lin Landsman, MD as Consulting Physician (Gastroenterology) Michael Boston, MD as Consulting Physician (General Surgery) [name], RPH-CPP (Pharmacist)    Objective:  Lab Results  Component Value Date   CREATININE 0.61 07/29/2020   CREATININE 0.62 05/27/2020   CREATININE 0.63 04/16/2020    Lab Results  Component Value Date   HGBA1C 8.0 (H) 07/29/2020   Last diabetic Eye exam:  Lab Results  Component Value Date/Time   HMDIABEYEEXA No Retinopathy 06/12/2018 12:00 AM    Last diabetic Foot exam:  Lab Results  Component Value Date/Time   HMDIABFOOTEX normal 01/27/2014 12:00 AM        Component Value Date/Time   CHOL 161 07/29/2020 0813   CHOL 155 01/08/2019 1122   TRIG 168.0 (H) 07/29/2020 0813   TRIG 218 (H) 01/08/2019 1122   HDL 47.20 07/29/2020 0813   CHOLHDL 3 07/29/2020 0813   VLDL 33.6 07/29/2020 0813   LDLCALC 80 07/29/2020 0813   LDLDIRECT 72.0 04/12/2018 0825    Hepatic Function Latest Ref Rng & Units 07/29/2020 05/27/2020 04/16/2020  Total Protein 6.0 - 8.3 g/dL 6.8 6.7 6.8  Albumin 3.5 - 5.2 g/dL 4.0 4.2 3.9   AST 0 - 37 U/L 24 22 22   ALT 0 - 35 U/L 36(H) 32 32  Alk Phosphatase 39 - 117 U/L 86 94 91  Total Bilirubin 0.2 - 1.2 mg/dL 0.5 0.6 0.7  Bilirubin, Direct 0.0 - 0.3 mg/dL - - -    Lab Results  Component Value Date/Time   TSH 3.11 01/14/2020 08:02 AM   TSH 1.62 02/04/2015 01:37 PM    CBC Latest Ref Rng & Units 07/29/2020 05/27/2020 02/19/2020  WBC 4.0 - 10.5 K/uL 6.0 6.2 5.7  Hemoglobin 12.0 - 15.0 g/dL 13.6 14.5 14.5  Hematocrit 36.0 - 46.0 % 40.2 42.3 42.2  Platelets 150.0 - 400.0 K/uL 290.0 263.0 239    Lab Results  Component Value Date/Time   VD25OH 35.87 07/29/2020 08:13 AM   VD25OH 34.28 11/29/2017 09:15 AM    Clinical ASCVD: No  The 10-year ASCVD risk score Mikey Bussing DC Jr., et al., 2013) is: 24.6%   Values used to calculate the score:     Age: 70 years     Sex: Female     Is Non-Hispanic African American: No     Diabetic: Yes     Tobacco smoker: No     Systolic Blood Pressure: 527 mmHg     Is BP treated: Yes     HDL Cholesterol: 47.2 mg/dL     Total Cholesterol: 161 mg/dL    Social History   Tobacco Use  Smoking Status Never  Smokeless Tobacco Never  Tobacco Comments   father growing up   BP Readings from Last 3 Encounters:  07/30/20 (!) 142/68  06/12/20 (!) 150/70  06/12/20 (!) 163/81   Pulse Readings from Last 3 Encounters:  07/30/20 83  06/12/20 79  06/12/20 73   Wt Readings from Last 3 Encounters:  08/26/20 132 lb (59.9 kg)  07/30/20 132 lb 6.4 oz (60.1 kg)  06/12/20 132 lb (59.9 kg)    Assessment: Review of patient past medical history, allergies, medications, health status, including review of consultants reports, laboratory and other test data, was performed as part of comprehensive evaluation and provision of chronic care management services.   SDOH:  (Social Determinants of Health) assessments and interventions performed:  SDOH Interventions    Flowsheet Row Most Recent Value  SDOH Interventions   Financial Strain Interventions  Intervention Not Indicated       CCM Care Plan  Allergies  Allergen Reactions   Prednisone Hives    Medications Reviewed Today     Reviewed by Dia Crawford, LPN (Licensed Practical Nurse) on 08/26/20 at 1316  Med List Status: <None>   Medication Order Taking? Sig Documenting Provider Last Dose Status Informant  amLODipine (NORVASC) 2.5 MG tablet [id number] No Take 1 tablet (2.5 mg total) by mouth every evening. Crecencio Mc, MD Taking Active   Ascorbic Acid (VITAMIN C) 1000 MG tablet [id number] No Take 1,000 mg by mouth daily.  [provider] Taking Active Self  aspirin EC 81 MG tablet [id number] No Take 81 mg by mouth at bedtime. Swallow whole. [provider] Taking Active Self  Calcium Carbonate-Vitamin D3 600-400 MG-UNIT TABS [id number] No Take 1 tablet by mouth daily. [provider] Taking Active Self  clobetasol cream (TEMOVATE) 0.05 % [id number] No Apply 1 application topically 2 (two) times daily as needed (psoriasis). [provider] Taking Active Self  conjugated estrogens (PREMARIN) vaginal cream [id number] No Place 1 Applicatorful vaginally daily.  Patient taking differently: Place 1 Applicatorful vaginally daily as needed (dryness/irritation).   Rubie Maid, MD Taking Active   cyclobenzaprine (FLEXERIL) 5 MG tablet [id number] No Take 1 tablet (5 mg total) by mouth 3 (three) times daily as needed.  Patient not taking: Reported on 08/18/2020   Crecencio Mc, MD Not Taking Active   escitalopram (LEXAPRO) 5 MG tablet [id number] No Take 1 tablet (5 mg total) by mouth every evening. Crecencio Mc, MD Taking Active   folic acid (FOLVITE) 1 MG tablet [id number] No Take 1 tablet (1 mg total) by mouth See admin instructions. Takes daily except on Saturday Crecencio Mc, MD Taking Active   glipiZIDE (GLUCOTROL) 5 MG tablet [id number]  Take 1 tablet (5 mg total) by mouth daily before breakfast. Crecencio Mc, MD  Active   glucose  blood (FREESTYLE LITE) test strip [id number] No Use as instructed to test blood sugar twice daily Crecencio Mc, MD Taking Active Self  hydrochlorothiazide (HYDRODIURIL) 12.5 MG tablet [id number] No Take 1 tablet (12.5 mg total) by mouth daily. Crecencio Mc, MD Taking Active Self  loratadine (CLARITIN) 10 MG tablet [id number] No Take 1 tablet (10 mg total) by mouth daily. Crecencio Mc, MD Taking Active   metFORMIN (GLUCOPHAGE XR) 750 MG 24 hr tablet [id number] No Take 2 tablets (1,500 mg total) by mouth daily with breakfast. Crecencio Mc, MD Taking Active   methotrexate (RHEUMATREX) 2.5 MG tablet [id number] No Take 3 tablets (7.5 mg total) by mouth once a week. Caution:Chemotherapy. Protect from  lightCrecencio Mc, MD Taking Active   montelukast (SINGULAIR) 10 MG tablet [id number] No Take 1 tablet (10 mg total) by mouth at bedtime. Crecencio Mc, MD Taking Active Self  Multiple Vitamin (MULTIVITAMIN) capsule [id number] No Take 1 capsule by mouth daily. [provider] Taking Active Self  omeprazole (PRILOSEC) 20 MG capsule [id number] No Take 1 capsule (20 mg total) by mouth daily. Crecencio Mc, MD Taking Active   Precision Thin Lancets MISC [id number] No Use as directed to check sugars twice daily Crecencio Mc, MD Taking Active Self  rosuvastatin (CRESTOR) 10 MG tablet [id number] No Take 1 tablet (10 mg total) by mouth daily. Crecencio Mc, MD Taking Active   Semaglutide,0.25 or 0.5MG/DOS, (OZEMPIC, 0.25 OR 0.5 MG/DOSE,) 2 MG/1.5ML SOPN [id number]  Inject 0.25 mg weekly for 2 weeks then increase to 0.5 mg weekly Crecencio Mc, MD  Active   SUPREP BOWEL PREP KIT 17.5-3.13-1.6 GM/177ML SOLN [id number] No SMARTSIG:354 Milliliter(s) By Mouth Once  Patient not taking: Reported on 08/18/2020   [provider] Not Taking Active   telmisartan (MICARDIS) 80 MG tablet [id number] No Take 1 tablet (80 mg total) by mouth daily. Crecencio Mc, MD Taking Active            Med Note  Mayo Ao Aug 24, 2020  4:58 PM)    traMADol (ULTRAM) 50 MG tablet [id number] No Take 1 tablet (50 mg total) by mouth every 6 (six) hours as needed (Breakthrough pain).  Patient not taking: No sig reported   Poggi, Marshall Cork, MD Not Taking Active   vitamin E 400 UNIT capsule [id number] No Take 400 Units by mouth daily. [provider] Taking Active Self            Patient Active Problem List   Diagnosis Date Noted   Lumbar degenerative disc disease 07/31/2020   Leg pain, posterior, left 05/27/2020   S/P shoulder replacement, right 04/24/2020   Status post reverse total shoulder replacement, right 04/06/2020   Anastomotic stricture of colorectal region 03/10/2020   Hemorrhage of colon due to diverticulosis 03/10/2020   Rotator cuff arthropathy, right 11/25/2019   Travel advice encounter 10/15/2019   Fall at home, initial encounter 10/14/2019   Atherosclerosis of aorta ([place]) 10/14/2019   Vaginal atrophy 08/20/2019   Dyspareunia in female 08/20/2019   Perineal rash in female 04/12/2019   Hearing loss 04/12/2019   Breast calcifications on mammogram 09/13/2018   Family history of Alzheimer's disease 08/13/2018   Macrocytosis without anemia 04/14/2018   Hx of colonic polyps    S/P partial colectomy 08/05/2017   Tubular adenoma of colon 08/03/2017   Overweight (BMI 25.0-29.9) 05/04/2016   Basal cell carcinoma of right forehead 08/28/2015   Patent foramen ovale with right to left shunt 08/28/2015   Postmenopausal estrogen deficiency 05/07/2015   DM type 2 with diabetic mixed hyperlipidemia ([place]) 02/08/2015   Uncontrolled type 2 diabetes mellitus ([place]) 02/08/2015   Allergic rhinitis 01/28/2013   Basal cell carcinoma of left side of nose 04/28/2012   Long-term use of high-risk medication 10/21/2011   Routine general medical examination at a health care facility 07/18/2011   Guttate psoriasis 04/04/2011   Constipation 01/19/2011   Reflux esophagitis 01/19/2011    Diverticulosis of sigmoid colon 11/10/2010   White coat syndrome with diagnosis of hypertension 09/07/2010   Hyperlipidemia associated with type 2 diabetes mellitus ([place]) 09/07/2010   Hyperlipidemia 09/07/2010    Immunization History  Administered Date(s) Administered   Fluad Quad(high Dose 65+) 10/03/2018   Influenza Split 10/19/2011   Influenza, High Dose Seasonal PF 10/01/2014, 10/06/2016, 09/06/2017   Influenza,inj,Quad PF,6+ Mos 10/31/2012, 09/26/2013, 08/26/2015   Influenza-Unspecified 10/07/2019   PFIZER(Purple Top)SARS-COV-2 Vaccination 02/12/2019, 03/05/2019, 10/09/2019, 04/08/2020   Pneumococcal Conjugate-13 01/28/2013   Pneumococcal Polysaccharide-23 11/02/2010, 05/04/2016   Tdap 11/16/2010   Zoster Recombinat (Shingrix) 06/25/2017, 09/06/2017   Zoster, Live 10/19/2007    Conditions to be addressed/monitored: HTN, HLD, and DMII  Care Plan : Medication Management  Updates made by [name], RPH-CPP since 08/28/2020 12:00 AM     Problem: Diabetes, HTN, HLD, psoriasis      Long-Range Goal: Disease Progression Prevention   Start Date: 08/18/2020  This Visit's Progress: On track  Recent Progress: On track  Priority: High  Note:   Current Barriers:  Unable to achieve control of diabetes   Pharmacist Clinical Goal(s):  Over the next 90 days, patient will achieve control of diabetes as evidenced by A1c  through collaboration with PharmD and provider.  Interventions: 1:1 collaboration with Crecencio Mc, MD regarding development and update of comprehensive plan of care as evidenced by provider attestation and co-signature Inter-disciplinary care team collaboration (see longitudinal plan of care) Comprehensive medication review performed; medication list updated in electronic medical record  SDOH: [employer] for Life. No medication cost concerns.   Health Maintenance: Due for DM eye exam. Will discuss moving forward.   Diabetes: Uncontrolled; current  treatment: metformin XR 1500 mg daily, Ozempic 0.25 mg weekly - to start this week; glipizide 5 mg daily;  Received denial for Ozempic PA as she has not tried and failed Bydureon therapy. Patient reported she called [facility] and they recommended that the PA be re-completed over the phone. I spoke with a representative and did so. Also included the CV risk reduction diagnosis information. Unfortunately, PA was still denied. They recommended we submit an appeal.Appeal completed due to patient's diagnosis of cardiovascular disease and preference for agents with CV risk reduction (in SUSTAIN 6 trial) benefit, which Bydureon does not. Will follow for result. Patient notified.   Hypertension: Uncontrolled at last clinic; current treatment: amlodipine 2.5 mg daily, telmisartan 80 mg daily, HCTZ 12.5 mg daily Current home readings: will discuss after patient transitions to telmisartan Previously recommended to continue current regimen  Hyperlipidemia, Aortic Atherosclerosis: Could consider tighter control given DM + aortic atherosclerosis, as well as risk factor of inflammatory conditions; current treatment: rosuvastatin 10 mg daily Current antiplatelet regimen: aspirin 81 mg daily Previously recommended to continue current regimen  Depression: Controlled per patient report; current regimen: escitalopram 5 mg daily Previously recommended to continue current regimen at this time  Psoriasis: Controlled per patient report; current regimen: methotrexate 7.5 mg once weekly, folic acid 1 mg daily Previously recommended to continue current regimen at this time along with collaboration with dermatology  Allergies: Controlled per patient report; current regimen: loratadine 10 mg daily, montelukast 10 mg daily Previously recommended to continue current regimen at this time  GERD: Controlled per patient report; current regimen: omeprazole 20 mg daily Previously recommended to continue current regimen  at this time. Evaluate ability to reduce to PRN PPI or H2RA therapy moving forward.   Atrophic Vaginitis: Controlled per patient report; current regimen: premarin vaginal cream PRN Previously recommended to continue current regimen at this time.  Supplements: Vitamin D, Vitamin C, Calcium, MVI  Patient Goals/Self-Care Activities Over the next 90 days, patient will:  - take medications as prescribed  check glucose twice daily, document, and provide at future appointments check blood pressure perioidically, document, and provide at future appointments  Follow Up Plan: Face to Face appointment with care management team member scheduled for:  ~10 weeks w/ PCP visit         Medication Assistance: None required.  Patient affirms current coverage meets needs.  Patient's preferred pharmacy is:  [id number] [address], [place] [address] Phone: [phone] Fax: [phone]  [facility] [place] [address] Phone: [phone] Fax: [phone]   Follow Up:  Patient agrees to Care Plan and Follow-up.  Plan: Face to Face appointment with care management team member scheduled for: 10 weeks w/ next PCP visit  [name], PharmD, [place], [place] Clinical Pharmacist [employer] at [facility] [phone]

## 2020-08-28 NOTE — Patient Instructions (Signed)
Visit Information  PATIENT GOALS:  Goals Addressed               This Visit's Progress     Patient Stated     Medication Monitoring (pt-stated)        Patient Goals/Self-Care Activities Over the next 90 days, patient will:  - take medications as prescribed check glucose twice daily, document, and provide at future appointments check blood pressure perioidically, document, and provide at future appointments          Patient verbalizes understanding of instructions provided today and agrees to view in [place].   Plan: Face to Face appointment with care management team member scheduled for: 10 weeks w/ next PCP visit  [name], PharmD, [place], [place] Clinical Pharmacist [employer] at [facility] [phone]

## 2020-08-31 ENCOUNTER — Telehealth: Payer: Self-pay | Admitting: Pharmacist

## 2020-08-31 ENCOUNTER — Ambulatory Visit: Payer: Medicare Other | Admitting: Pharmacist

## 2020-08-31 DIAGNOSIS — E782 Mixed hyperlipidemia: Secondary | ICD-10-CM

## 2020-08-31 DIAGNOSIS — I700 Atherosclerosis of aorta: Secondary | ICD-10-CM

## 2020-08-31 DIAGNOSIS — E1169 Type 2 diabetes mellitus with other specified complication: Secondary | ICD-10-CM

## 2020-08-31 NOTE — Patient Instructions (Signed)
Visit Information  PATIENT GOALS:  Goals Addressed               This Visit's Progress     Patient Stated     Medication Monitoring (pt-stated)        Patient Goals/Self-Care Activities Over the next 90 days, patient will:  - take medications as prescribed check glucose twice daily, document, and provide at future appointments check blood pressure perioidically, document, and provide at future appointments         Patient verbalizes understanding of instructions provided today and agrees to view in [place].   Plan: Telephone follow up appointment with care management team member scheduled for:  ~10 weeks  [name], PharmD, [place], [place] Clinical Pharmacist [employer] at [employer] [phone]

## 2020-08-31 NOTE — Telephone Encounter (Signed)
Medication Samples have been labeled and logged for the patient. She will come by to pick up today or tomorrow  Drug name: Ozempic       Strength: 0.25/0.5 mg        Qty: 1 pen  LOT: [id number]  Exp.Date: 02/03/20  Dosing instructions: Complete a total of 4 weeks of 0.25 mg weekly then increase to 0.5 mg weekly  The patient has been instructed regarding the correct time, dose, and frequency of taking this medication, including desired effects and most common side effects.   [name] 4:06 PM 08/31/2020

## 2020-08-31 NOTE — Chronic Care Management (AMB) (Signed)
Chronic Care Management Pharmacy Note  08/31/2020 Name:  Emily Ryan MRN:  [id number] DOB:  [birth year]   Subjective: Emily Ryan is an 75 y.o. year old female who is a primary patient of Derrel Nip, Aris Everts, MD.  The CCM team was consulted for assistance with disease management and care coordination needs.    Engaged with patient by telephone for  medication access  in response to provider referral for pharmacy case management and/or care coordination services.   Consent to Services:  The patient was given information about Chronic Care Management services, agreed to services, and gave verbal consent prior to initiation of services.  Please see initial visit note for detailed documentation.   Patient Care Team: Crecencio Mc, MD as PCP - General (Internal Medicine) Minna Merritts, MD as Consulting Physician (Cardiology) Lin Landsman, MD as Consulting Physician (Gastroenterology) Michael Boston, MD as Consulting Physician (General Surgery) [name], RPH-CPP (Pharmacist)   Objective:  Lab Results  Component Value Date   CREATININE 0.61 07/29/2020   CREATININE 0.62 05/27/2020   CREATININE 0.63 04/16/2020    Lab Results  Component Value Date   HGBA1C 8.0 (H) 07/29/2020   Last diabetic Eye exam:  Lab Results  Component Value Date/Time   HMDIABEYEEXA No Retinopathy 06/12/2018 12:00 AM    Last diabetic Foot exam:  Lab Results  Component Value Date/Time   HMDIABFOOTEX normal 01/27/2014 12:00 AM        Component Value Date/Time   CHOL 161 07/29/2020 0813   CHOL 155 01/08/2019 1122   TRIG 168.0 (H) 07/29/2020 0813   TRIG 218 (H) 01/08/2019 1122   HDL 47.20 07/29/2020 0813   CHOLHDL 3 07/29/2020 0813   VLDL 33.6 07/29/2020 0813   LDLCALC 80 07/29/2020 0813   LDLDIRECT 72.0 04/12/2018 0825    Hepatic Function Latest Ref Rng & Units 07/29/2020 05/27/2020 04/16/2020  Total Protein 6.0 - 8.3 g/dL 6.8 6.7 6.8  Albumin 3.5 - 5.2 g/dL 4.0 4.2  3.9  AST 0 - 37 U/L 24 22 22   ALT 0 - 35 U/L 36(H) 32 32  Alk Phosphatase 39 - 117 U/L 86 94 91  Total Bilirubin 0.2 - 1.2 mg/dL 0.5 0.6 0.7  Bilirubin, Direct 0.0 - 0.3 mg/dL - - -    Lab Results  Component Value Date/Time   TSH 3.11 01/14/2020 08:02 AM   TSH 1.62 02/04/2015 01:37 PM    CBC Latest Ref Rng & Units 07/29/2020 05/27/2020 02/19/2020  WBC 4.0 - 10.5 K/uL 6.0 6.2 5.7  Hemoglobin 12.0 - 15.0 g/dL 13.6 14.5 14.5  Hematocrit 36.0 - 46.0 % 40.2 42.3 42.2  Platelets 150.0 - 400.0 K/uL 290.0 263.0 239    Lab Results  Component Value Date/Time   VD25OH 35.87 07/29/2020 08:13 AM   VD25OH 34.28 11/29/2017 09:15 AM    Clinical ASCVD: No  The 10-year ASCVD risk score Mikey Bussing DC Jr., et al., 2013) is: 24.6%   Values used to calculate the score:     Age: 55 years     Sex: Female     Is Non-Hispanic African American: No     Diabetic: Yes     Tobacco smoker: No     Systolic Blood Pressure: 343 mmHg     Is BP treated: Yes     HDL Cholesterol: 47.2 mg/dL     Total Cholesterol: 161 mg/dL      Social History   Tobacco Use  Smoking Status Never  Smokeless Tobacco Never  Tobacco Comments   father growing up   BP Readings from Last 3 Encounters:  07/30/20 (!) 142/68  06/12/20 (!) 150/70  06/12/20 (!) 163/81   Pulse Readings from Last 3 Encounters:  07/30/20 83  06/12/20 79  06/12/20 73   Wt Readings from Last 3 Encounters:  08/26/20 132 lb (59.9 kg)  07/30/20 132 lb 6.4 oz (60.1 kg)  06/12/20 132 lb (59.9 kg)    Assessment: Review of patient past medical history, allergies, medications, health status, including review of consultants reports, laboratory and other test data, was performed as part of comprehensive evaluation and provision of chronic care management services.   SDOH:  (Social Determinants of Health) assessments and interventions performed:  SDOH Interventions    Flowsheet Row Most Recent Value  SDOH Interventions   Financial Strain Interventions  Intervention Not Indicated       CCM Care Plan  Allergies  Allergen Reactions   Prednisone Hives    Medications Reviewed Today     Reviewed by Dia Crawford, LPN (Licensed Practical Nurse) on 08/26/20 at 1316  Med List Status: <None>   Medication Order Taking? Sig Documenting Provider Last Dose Status Informant  amLODipine (NORVASC) 2.5 MG tablet [id number] No Take 1 tablet (2.5 mg total) by mouth every evening. Crecencio Mc, MD Taking Active   Ascorbic Acid (VITAMIN C) 1000 MG tablet [id number] No Take 1,000 mg by mouth daily.  [provider] Taking Active Self  aspirin EC 81 MG tablet [id number] No Take 81 mg by mouth at bedtime. Swallow whole. [provider] Taking Active Self  Calcium Carbonate-Vitamin D3 600-400 MG-UNIT TABS [id number] No Take 1 tablet by mouth daily. [provider] Taking Active Self  clobetasol cream (TEMOVATE) 0.05 % [id number] No Apply 1 application topically 2 (two) times daily as needed (psoriasis). [provider] Taking Active Self  conjugated estrogens (PREMARIN) vaginal cream [id number] No Place 1 Applicatorful vaginally daily.  Patient taking differently: Place 1 Applicatorful vaginally daily as needed (dryness/irritation).   Rubie Maid, MD Taking Active   cyclobenzaprine (FLEXERIL) 5 MG tablet [id number] No Take 1 tablet (5 mg total) by mouth 3 (three) times daily as needed.  Patient not taking: Reported on 08/18/2020   Crecencio Mc, MD Not Taking Active   escitalopram (LEXAPRO) 5 MG tablet [id number] No Take 1 tablet (5 mg total) by mouth every evening. Crecencio Mc, MD Taking Active   folic acid (FOLVITE) 1 MG tablet [id number] No Take 1 tablet (1 mg total) by mouth See admin instructions. Takes daily except on Saturday Crecencio Mc, MD Taking Active   glipiZIDE (GLUCOTROL) 5 MG tablet [id number]  Take 1 tablet (5 mg total) by mouth daily before breakfast. Crecencio Mc, MD  Active   glucose  blood (FREESTYLE LITE) test strip [id number] No Use as instructed to test blood sugar twice daily Crecencio Mc, MD Taking Active Self  hydrochlorothiazide (HYDRODIURIL) 12.5 MG tablet [id number] No Take 1 tablet (12.5 mg total) by mouth daily. Crecencio Mc, MD Taking Active Self  loratadine (CLARITIN) 10 MG tablet [id number] No Take 1 tablet (10 mg total) by mouth daily. Crecencio Mc, MD Taking Active   metFORMIN (GLUCOPHAGE XR) 750 MG 24 hr tablet [id number] No Take 2 tablets (1,500 mg total) by mouth daily with breakfast. Crecencio Mc, MD Taking Active   methotrexate (RHEUMATREX) 2.5 MG tablet [id number] No Take 3 tablets (7.5 mg total) by mouth once a  week. Caution:Chemotherapy. Protect from light. Crecencio Mc, MD Taking Active   montelukast (SINGULAIR) 10 MG tablet [id number] No Take 1 tablet (10 mg total) by mouth at bedtime. Crecencio Mc, MD Taking Active Self  Multiple Vitamin (MULTIVITAMIN) capsule [id number] No Take 1 capsule by mouth daily. [provider] Taking Active Self  omeprazole (PRILOSEC) 20 MG capsule [id number] No Take 1 capsule (20 mg total) by mouth daily. Crecencio Mc, MD Taking Active   Precision Thin Lancets MISC [id number] No Use as directed to check sugars twice daily Crecencio Mc, MD Taking Active Self  rosuvastatin (CRESTOR) 10 MG tablet [id number] No Take 1 tablet (10 mg total) by mouth daily. Crecencio Mc, MD Taking Active   Semaglutide,0.25 or 0.5MG/DOS, (OZEMPIC, 0.25 OR 0.5 MG/DOSE,) 2 MG/1.5ML SOPN [id number]  Inject 0.25 mg weekly for 2 weeks then increase to 0.5 mg weekly Crecencio Mc, MD  Active   SUPREP BOWEL PREP KIT 17.5-3.13-1.6 GM/177ML SOLN [id number] No SMARTSIG:354 Milliliter(s) By Mouth Once  Patient not taking: Reported on 08/18/2020   [provider] Not Taking Active   telmisartan (MICARDIS) 80 MG tablet [id number] No Take 1 tablet (80 mg total) by mouth daily. Crecencio Mc, MD Taking Active            Med Note  [facility] Ao Aug 24, 2020  4:58 PM)    traMADol (ULTRAM) 50 MG tablet [id number] No Take 1 tablet (50 mg total) by mouth every 6 (six) hours as needed (Breakthrough pain).  Patient not taking: No sig reported   Poggi, Marshall Cork, MD Not Taking Active   vitamin E 400 UNIT capsule [id number] No Take 400 Units by mouth daily. [provider] Taking Active Self            Patient Active Problem List   Diagnosis Date Noted   Lumbar degenerative disc disease 07/31/2020   Leg pain, posterior, left 05/27/2020   S/P shoulder replacement, right 04/24/2020   Status post reverse total shoulder replacement, right 04/06/2020   Anastomotic stricture of colorectal region 03/10/2020   Hemorrhage of colon due to diverticulosis 03/10/2020   Rotator cuff arthropathy, right 11/25/2019   Travel advice encounter 10/15/2019   Fall at home, initial encounter 10/14/2019   Atherosclerosis of aorta ([place]) 10/14/2019   Vaginal atrophy 08/20/2019   Dyspareunia in female 08/20/2019   Perineal rash in female 04/12/2019   Hearing loss 04/12/2019   Breast calcifications on mammogram 09/13/2018   Family history of Alzheimer's disease 08/13/2018   Macrocytosis without anemia 04/14/2018   Hx of colonic polyps    S/P partial colectomy 08/05/2017   Tubular adenoma of colon 08/03/2017   Overweight (BMI 25.0-29.9) 05/04/2016   Basal cell carcinoma of right forehead 08/28/2015   Patent foramen ovale with right to left shunt 08/28/2015   Postmenopausal estrogen deficiency 05/07/2015   DM type 2 with diabetic mixed hyperlipidemia ([place]) 02/08/2015   Uncontrolled type 2 diabetes mellitus ([place]) 02/08/2015   Allergic rhinitis 01/28/2013   Basal cell carcinoma of left side of nose 04/28/2012   Long-term use of high-risk medication 10/21/2011   Routine general medical examination at a health care facility 07/18/2011   Guttate psoriasis 04/04/2011   Constipation 01/19/2011   Reflux esophagitis 01/19/2011    Diverticulosis of sigmoid colon 11/10/2010   White coat syndrome with diagnosis of hypertension 09/07/2010   Hyperlipidemia associated with type 2 diabetes mellitus ([place]) 09/07/2010   Hyperlipidemia 09/07/2010  Immunization History  Administered Date(s) Administered   Fluad Quad(high Dose 65+) 10/03/2018   Influenza Split 10/19/2011   Influenza, High Dose Seasonal PF 10/01/2014, 10/06/2016, 09/06/2017   Influenza,inj,Quad PF,6+ Mos 10/31/2012, 09/26/2013, 08/26/2015   Influenza-Unspecified 10/07/2019   PFIZER(Purple Top)SARS-COV-2 Vaccination 02/12/2019, 03/05/2019, 10/09/2019, 04/08/2020   Pneumococcal Conjugate-13 01/28/2013   Pneumococcal Polysaccharide-23 11/02/2010, 05/04/2016   Tdap 11/16/2010   Zoster Recombinat (Shingrix) 06/25/2017, 09/06/2017   Zoster, Live 10/19/2007    Conditions to be addressed/monitored: HTN, HLD, and DMII  Care Plan : Medication Management  Updates made by [name], RPH-CPP since 08/31/2020 12:00 AM     Problem: Diabetes, HTN, HLD, psoriasis      Long-Range Goal: Disease Progression Prevention   Start Date: 08/18/2020  This Visit's Progress: On track  Recent Progress: On track  Priority: High  Note:   Current Barriers:  Unable to achieve control of diabetes   Pharmacist Clinical Goal(s):  Over the next 90 days, patient will achieve control of diabetes as evidenced by A1c  through collaboration with PharmD and provider.  Interventions: 1:1 collaboration with Crecencio Mc, MD regarding development and update of comprehensive plan of care as evidenced by provider attestation and co-signature Inter-disciplinary care team collaboration (see longitudinal plan of care) Comprehensive medication review performed; medication list updated in electronic medical record  SDOH: [employer] for Life. No medication cost concerns.   Health Maintenance: Due for DM eye exam. Will discuss moving forward.   Diabetes: Uncontrolled; current  treatment: metformin XR 1500 mg daily, Ozempic 0.25 mg weekly; glipizide 5 mg daily;  Received notice that [employer] requires a form to be completed for the provider to complete an appeal on behalf of the patient. Contacted patient, she will come by the office and sign this week.  Notes she wasted a dose of Ozempic. Wonders about another sample while awaiting decision from insurance, especially since they will be traveling soon.   Hypertension: Uncontrolled at last clinic; current treatment: amlodipine 2.5 mg daily, telmisartan 80 mg daily, HCTZ 12.5 mg daily Current home readings: will discuss after patient transitions to telmisartan Previously recommended to continue current regimen  Hyperlipidemia, Aortic Atherosclerosis: Could consider tighter control given DM + aortic atherosclerosis, as well as risk factor of inflammatory conditions; current treatment: rosuvastatin 10 mg daily Current antiplatelet regimen: aspirin 81 mg daily Previously recommended to continue current regimen  Depression: Controlled per patient report; current regimen: escitalopram 5 mg daily Previously recommended to continue current regimen at this time  Psoriasis: Controlled per patient report; current regimen: methotrexate 7.5 mg once weekly, folic acid 1 mg daily Previously recommended to continue current regimen at this time along with collaboration with dermatology  Allergies: Controlled per patient report; current regimen: loratadine 10 mg daily, montelukast 10 mg daily Previously recommended to continue current regimen at this time  GERD: Controlled per patient report; current regimen: omeprazole 20 mg daily Previously recommended to continue current regimen at this time. Evaluate ability to reduce to PRN PPI or H2RA therapy moving forward.   Atrophic Vaginitis: Controlled per patient report; current regimen: premarin vaginal cream PRN Previously recommended to continue current regimen at this  time.  Supplements: Vitamin D, Vitamin C, Calcium, MVI  Patient Goals/Self-Care Activities Over the next 90 days, patient will:  - take medications as prescribed check glucose twice daily, document, and provide at future appointments check blood pressure perioidically, document, and provide at future appointments  Follow Up Plan: Face to Face appointment with care management team member  scheduled for:  ~10 weeks w/ PCP visit     Medication Assistance: None required.  Patient affirms current coverage meets needs.  Patient's preferred pharmacy is:  [id number] [address], [place] [address] Phone: [phone] Fax: [phone]  [facility] [place], [place] [address] [place] [address] Phone: [phone] Fax: [phone]    Follow Up:  Patient agrees to Care Plan and Follow-up.  Plan: Telephone follow up appointment with care management team member scheduled for:  ~10 weeks  [name], PharmD, [place], [place] Clinical Pharmacist [employer] at [employer] [phone]

## 2020-09-02 ENCOUNTER — Ambulatory Visit: Payer: Medicare Other | Admitting: Pharmacist

## 2020-09-02 DIAGNOSIS — E785 Hyperlipidemia, unspecified: Secondary | ICD-10-CM

## 2020-09-02 DIAGNOSIS — E1169 Type 2 diabetes mellitus with other specified complication: Secondary | ICD-10-CM

## 2020-09-02 DIAGNOSIS — I700 Atherosclerosis of aorta: Secondary | ICD-10-CM

## 2020-09-02 NOTE — Patient Instructions (Signed)
Visit Information  PATIENT GOALS:  Goals Addressed               This Visit's Progress     Patient Stated     Medication Monitoring (pt-stated)        Patient Goals/Self-Care Activities Over the next 90 days, patient will:  - take medications as prescribed check glucose twice daily, document, and provide at future appointments check blood pressure perioidically, document, and provide at future appointments        Patient verbalizes understanding of instructions provided today and agrees to view in [place].  Plan: Face to Face appointment with care management team member scheduled for: ~ 10 weeks [name], PharmD, [place], [place] Clinical Pharmacist [employer] at [facility] [phone]

## 2020-09-02 NOTE — Chronic Care Management (AMB) (Signed)
Chronic Care Management Pharmacy Note  09/02/2020 Name:  Emily Ryan MRN:  [id number] DOB:  Jun 24, 1945   Subjective: Emily Ryan is an 75 y.o. year old female who is a primary patient of Derrel Nip, Aris Everts, MD.  The CCM team was consulted for assistance with disease management and care coordination needs.    Care coordination for follow up visit in response to provider referral for pharmacy case management and/or care coordination services.   Consent to Services:  The patient was given information about Chronic Care Management services, agreed to services, and gave verbal consent prior to initiation of services.  Please see initial visit note for detailed documentation.   Patient Care Team: Crecencio Mc, MD as PCP - General (Internal Medicine) Minna Merritts, MD as Consulting Physician (Cardiology) Lin Landsman, MD as Consulting Physician (Gastroenterology) Michael Boston, MD as Consulting Physician (General Surgery) [name], RPH-CPP (Pharmacist)   Objective:  Lab Results  Component Value Date   CREATININE 0.61 07/29/2020   CREATININE 0.62 05/27/2020   CREATININE 0.63 04/16/2020    Lab Results  Component Value Date   HGBA1C 8.0 (H) 07/29/2020   Last diabetic Eye exam:  Lab Results  Component Value Date/Time   HMDIABEYEEXA No Retinopathy 06/12/2018 12:00 AM    Last diabetic Foot exam:  Lab Results  Component Value Date/Time   HMDIABFOOTEX normal 01/27/2014 12:00 AM        Component Value Date/Time   CHOL 161 07/29/2020 0813   CHOL 155 01/08/2019 1122   TRIG 168.0 (H) 07/29/2020 0813   TRIG 218 (H) 01/08/2019 1122   HDL 47.20 07/29/2020 0813   CHOLHDL 3 07/29/2020 0813   VLDL 33.6 07/29/2020 0813   LDLCALC 80 07/29/2020 0813   LDLDIRECT 72.0 04/12/2018 0825    Hepatic Function Latest Ref Rng & Units 07/29/2020 05/27/2020 04/16/2020  Total Protein 6.0 - 8.3 g/dL 6.8 6.7 6.8  Albumin 3.5 - 5.2 g/dL 4.0 4.2 3.9  AST 0 - 37 U/L  _0 ALT 0 - 35 U/L 36(H) 32 32  Alk Phosphatase 39 - 117 U/L 86 94 91  Total Bilirubin 0.2 - 1.2 mg/dL 0.5 0.6 0.7  Bilirubin, Direct 0.0 - 0.3 mg/dL - - -    Lab Results  Component Value Date/Time   TSH 3.11 01/14/2020 08:02 AM   TSH 1.62 02/04/2015 01:37 PM    CBC Latest Ref Rng & Units 07/29/2020 05/27/2020 02/19/2020  WBC 4.0 - 10.5 K/uL 6.0 6.2 5.7  Hemoglobin 12.0 - 15.0 g/dL 13.6 14.5 14.5  Hematocrit 36.0 - 46.0 % 40.2 42.3 42.2  Platelets 150.0 - 400.0 K/uL 290.0 263.0 239    Lab Results  Component Value Date/Time   VD25OH 35.87 07/29/2020 08:13 AM   VD25OH 34.28 11/29/2017 09:15 AM    Clinical ASCVD: No  The 10-year ASCVD risk score Mikey Bussing DC Jr., et al., 2013) is: 24.6%   Values used to calculate the score:     Age: 28 years     Sex: Female     Is Non-Hispanic African American: No     Diabetic: Yes     Tobacco smoker: No     Systolic Blood Pressure: 235 mmHg     Is BP treated: Yes     HDL Cholesterol: 47.2 mg/dL     Total Cholesterol: 161 mg/dL     Social History   Tobacco Use  Smoking Status Never  Smokeless Tobacco Never  Tobacco  Comments   father growing up   BP Readings from Last 3 Encounters:  07/30/20 (!) 142/68  06/12/20 (!) 150/70  06/12/20 (!) 163/81   Pulse Readings from Last 3 Encounters:  07/30/20 83  06/12/20 79  06/12/20 73   Wt Readings from Last 3 Encounters:  08/26/20 132 lb (59.9 kg)  07/30/20 132 lb 6.4 oz (60.1 kg)  06/12/20 132 lb (59.9 kg)    Assessment: Review of patient past medical history, allergies, medications, health status, including review of consultants reports, laboratory and other test data, was performed as part of comprehensive evaluation and provision of chronic care management services.   SDOH:  (Social Determinants of Health) assessments and interventions performed:  SDOH Interventions    Flowsheet Row Most Recent Value  SDOH Interventions   Financial Strain Interventions Intervention Not  Indicated       CCM Care Plan  Allergies  Allergen Reactions   Prednisone Hives    Medications Reviewed Today     Reviewed by Dia Crawford, LPN (Licensed Practical Nurse) on 08/26/20 at 1316  Med List Status: <None>   Medication Order Taking? Sig Documenting Provider Last Dose Status Informant  amLODipine (NORVASC) 2.5 MG tablet [id number] No Take 1 tablet (2.5 mg total) by mouth every evening. Crecencio Mc, MD Taking Active   Ascorbic Acid (VITAMIN C) 1000 MG tablet [id number] No Take 1,000 mg by mouth daily.  [provider] Taking Active Self  aspirin EC 81 MG tablet [id number] No Take 81 mg by mouth at bedtime. Swallow whole. [provider] Taking Active Self  Calcium Carbonate-Vitamin D3 600-400 MG-UNIT TABS [id number] No Take 1 tablet by mouth daily. [provider] Taking Active Self  clobetasol cream (TEMOVATE) 0.05 % [id number] No Apply 1 application topically 2 (two) times daily as needed (psoriasis). [provider] Taking Active Self  conjugated estrogens (PREMARIN) vaginal cream [id number] No Place 1 Applicatorful vaginally daily.  Patient taking differently: Place 1 Applicatorful vaginally daily as needed (dryness/irritation).   Rubie Maid, MD Taking Active   cyclobenzaprine (FLEXERIL) 5 MG tablet [id number] No Take 1 tablet (5 mg total) by mouth 3 (three) times daily as needed.  Patient not taking: Reported on 08/18/2020   Crecencio Mc, MD Not Taking Active   escitalopram (LEXAPRO) 5 MG tablet [id number] No Take 1 tablet (5 mg total) by mouth every evening. Crecencio Mc, MD Taking Active   folic acid (FOLVITE) 1 MG tablet [id number] No Take 1 tablet (1 mg total) by mouth See admin instructions. Takes daily except on Saturday Crecencio Mc, MD Taking Active   glipiZIDE (GLUCOTROL) 5 MG tablet [id number]  Take 1 tablet (5 mg total) by mouth daily before breakfast. Crecencio Mc, MD  Active   glucose blood (FREESTYLE  LITE) test strip [id number] No Use as instructed to test blood sugar twice daily Crecencio Mc, MD Taking Active Self  hydrochlorothiazide (HYDRODIURIL) 12.5 MG tablet [id number] No Take 1 tablet (12.5 mg total) by mouth daily. Crecencio Mc, MD Taking Active Self  loratadine (CLARITIN) 10 MG tablet [id number] No Take 1 tablet (10 mg total) by mouth daily. Crecencio Mc, MD Taking Active   metFORMIN (GLUCOPHAGE XR) 750 MG 24 hr tablet [id number] No Take 2 tablets (1,500 mg total) by mouth daily with breakfast. Crecencio Mc, MD Taking Active   methotrexate (RHEUMATREX) 2.5 MG tablet [id number] No Take 3 tablets (7.5 mg total) by mouth once a week. Caution:Chemotherapy. Protect from light.  Crecencio Mc, MD Taking Active   montelukast (SINGULAIR) 10 MG tablet [id number] No Take 1 tablet (10 mg total) by mouth at bedtime. Crecencio Mc, MD Taking Active Self  Multiple Vitamin (MULTIVITAMIN) capsule [id number] No Take 1 capsule by mouth daily. [provider] Taking Active Self  omeprazole (PRILOSEC) 20 MG capsule [id number] No Take 1 capsule (20 mg total) by mouth daily. Crecencio Mc, MD Taking Active   Precision Thin Lancets MISC [id number] No Use as directed to check sugars twice daily Crecencio Mc, MD Taking Active Self  rosuvastatin (CRESTOR) 10 MG tablet [id number] No Take 1 tablet (10 mg total) by mouth daily. Crecencio Mc, MD Taking Active   Semaglutide,0.25 or 0.5MG/DOS, (OZEMPIC, 0.25 OR 0.5 MG/DOSE,) 2 MG/1.5ML SOPN [id number]  Inject 0.25 mg weekly for 2 weeks then increase to 0.5 mg weekly Crecencio Mc, MD  Active   SUPREP BOWEL PREP KIT 17.5-3.13-1.6 GM/177ML SOLN [id number] No SMARTSIG:354 Milliliter(s) By Mouth Once  Patient not taking: Reported on 08/18/2020   [provider] Not Taking Active   telmisartan (MICARDIS) 80 MG tablet [id number] No Take 1 tablet (80 mg total) by mouth daily. Crecencio Mc, MD Taking Active            Med Note Mayo Ao Aug 24, 2020  4:58 PM)    traMADol (ULTRAM) 50 MG tablet [id number] No Take 1 tablet (50 mg total) by mouth every 6 (six) hours as needed (Breakthrough pain).  Patient not taking: No sig reported   Poggi, Marshall Cork, MD Not Taking Active   vitamin E 400 UNIT capsule [id number] No Take 400 Units by mouth daily. [provider] Taking Active Self            Patient Active Problem List   Diagnosis Date Noted   Lumbar degenerative disc disease 07/31/2020   Leg pain, posterior, left 05/27/2020   S/P shoulder replacement, right 04/24/2020   Status post reverse total shoulder replacement, right 04/06/2020   Anastomotic stricture of colorectal region 03/10/2020   Hemorrhage of colon due to diverticulosis 03/10/2020   Rotator cuff arthropathy, right 11/25/2019   Travel advice encounter 10/15/2019   Fall at home, initial encounter 10/14/2019   Atherosclerosis of aorta ([place]) 10/14/2019   Vaginal atrophy 08/20/2019   Dyspareunia in female 08/20/2019   Perineal rash in female 04/12/2019   Hearing loss 04/12/2019   Breast calcifications on mammogram 09/13/2018   Family history of Alzheimer's disease 08/13/2018   Macrocytosis without anemia 04/14/2018   Hx of colonic polyps    S/P partial colectomy 08/05/2017   Tubular adenoma of colon 08/03/2017   Overweight (BMI 25.0-29.9) 05/04/2016   Basal cell carcinoma of right forehead 08/28/2015   Patent foramen ovale with right to left shunt 08/28/2015   Postmenopausal estrogen deficiency 05/07/2015   DM type 2 with diabetic mixed hyperlipidemia ([place]) 02/08/2015   Uncontrolled type 2 diabetes mellitus ([place]) 02/08/2015   Allergic rhinitis 01/28/2013   Basal cell carcinoma of left side of nose 04/28/2012   Long-term use of high-risk medication 10/21/2011   Routine general medical examination at a health care facility 07/18/2011   Guttate psoriasis 04/04/2011   Constipation 01/19/2011   Reflux esophagitis 01/19/2011   Diverticulosis of  sigmoid colon 11/10/2010   White coat syndrome with diagnosis of hypertension 09/07/2010   Hyperlipidemia associated with type 2 diabetes mellitus ([place]) 09/07/2010   Hyperlipidemia 09/07/2010    Immunization History  Administered  Date(s) Administered   Fluad Quad(high Dose 65+) 10/03/2018   Influenza Split 10/19/2011   Influenza, High Dose Seasonal PF 10/01/2014, 10/06/2016, 09/06/2017   Influenza,inj,Quad PF,6+ Mos 10/31/2012, 09/26/2013, 08/26/2015   Influenza-Unspecified 10/07/2019   PFIZER(Purple Top)SARS-COV-2 Vaccination 02/12/2019, 03/05/2019, 10/09/2019, 04/08/2020   Pneumococcal Conjugate-13 01/28/2013   Pneumococcal Polysaccharide-23 11/02/2010, 05/04/2016   Tdap 11/16/2010   Zoster Recombinat (Shingrix) 06/25/2017, 09/06/2017   Zoster, Live 10/19/2007    Conditions to be addressed/monitored: HTN, HLD, and DMII  Care Plan : Medication Management  Updates made by [name], RPH-CPP since 09/02/2020 12:00 AM     Problem: Diabetes, HTN, HLD, psoriasis      Long-Range Goal: Disease Progression Prevention   Start Date: 08/18/2020  This Visit's Progress: On track  Recent Progress: On track  Priority: High  Note:   Current Barriers:  Unable to achieve control of diabetes   Pharmacist Clinical Goal(s):  Over the next 90 days, patient will achieve control of diabetes as evidenced by A1c  through collaboration with PharmD and provider.  Interventions: 1:1 collaboration with Crecencio Mc, MD regarding development and update of comprehensive plan of care as evidenced by provider attestation and co-signature Inter-disciplinary care team collaboration (see longitudinal plan of care) Comprehensive medication review performed; medication list updated in electronic medical record  SDOH: [employer] for Life. No medication cost concerns.   Health Maintenance: Due for DM eye exam. Will discuss moving forward.   Diabetes: Uncontrolled; current treatment: metformin  XR 1500 mg daily, Ozempic 0.25 mg weekly; glipizide 5 mg daily;  Faxed appeal with patient signature back into [employer]. Will follow for result.   Hypertension: Uncontrolled at last clinic; current treatment: amlodipine 2.5 mg daily, telmisartan 80 mg daily, HCTZ 12.5 mg daily Current home readings: will discuss after patient transitions to telmisartan Previously recommended to continue current regimen  Hyperlipidemia, Aortic Atherosclerosis: Could consider tighter control given DM + aortic atherosclerosis, as well as risk factor of inflammatory conditions; current treatment: rosuvastatin 10 mg daily Current antiplatelet regimen: aspirin 81 mg daily Previously recommended to continue current regimen  Depression: Controlled per patient report; current regimen: escitalopram 5 mg daily Previously recommended to continue current regimen at this time  Psoriasis: Controlled per patient report; current regimen: methotrexate 7.5 mg once weekly, folic acid 1 mg daily Previously recommended to continue current regimen at this time along with collaboration with dermatology  Allergies: Controlled per patient report; current regimen: loratadine 10 mg daily, montelukast 10 mg daily Previously recommended to continue current regimen at this time  GERD: Controlled per patient report; current regimen: omeprazole 20 mg daily Previously recommended to continue current regimen at this time. Evaluate ability to reduce to PRN PPI or H2RA therapy moving forward.   Atrophic Vaginitis: Controlled per patient report; current regimen: premarin vaginal cream PRN Previously recommended to continue current regimen at this time.  Supplements: Vitamin D, Vitamin C, Calcium, MVI  Patient Goals/Self-Care Activities Over the next 90 days, patient will:  - take medications as prescribed check glucose twice daily, document, and provide at future appointments check blood pressure perioidically,  document, and provide at future appointments  Follow Up Plan: Face to Face appointment with care management team member scheduled for:  ~10 weeks w/ PCP visit     Medication Assistance: None required.  Patient affirms current coverage meets needs.  Patient's preferred pharmacy is:  [id number] - [address], [place]  [place] [address] Phone: [phone] Fax: [phone]  [facility] [place], [place] [place] [address] Phone: [phone] Fax: [phone]  Follow Up:  Patient agrees to Care Plan and Follow-up.  Plan: Face to Face appointment with care management team member scheduled for: ~ 10 weeks [name], PharmD, [place], [place] Clinical Pharmacist [employer] at [facility] [phone]

## 2020-09-08 NOTE — Telephone Encounter (Signed)
Patient previously picked up sample

## 2020-09-09 ENCOUNTER — Telehealth: Payer: Self-pay | Admitting: Internal Medicine

## 2020-09-09 NOTE — Telephone Encounter (Signed)
Patient came in today to see how she can get paperwork for a living will as well as the form that lists the person that can speak for you in a medical emergency.Please advise.

## 2020-09-10 NOTE — Telephone Encounter (Signed)
Pt is aware that she can come by the office and pick up the Advanced Directive packet.

## 2020-09-11 ENCOUNTER — Ambulatory Visit: Payer: Medicare Other | Admitting: Internal Medicine

## 2020-09-14 ENCOUNTER — Ambulatory Visit: Payer: Medicare Other | Admitting: Pharmacist

## 2020-09-14 DIAGNOSIS — I700 Atherosclerosis of aorta: Secondary | ICD-10-CM

## 2020-09-14 DIAGNOSIS — E1169 Type 2 diabetes mellitus with other specified complication: Secondary | ICD-10-CM

## 2020-09-14 NOTE — Patient Instructions (Signed)
Visit Information  PATIENT GOALS:  Goals Addressed               This Visit's Progress     Patient Stated     Medication Monitoring (pt-stated)        Patient Goals/Self-Care Activities Over the next 90 days, patient will:  - take medications as prescribed check glucose twice daily, document, and provide at future appointments check blood pressure perioidically, document, and provide at future appointments         Patient verbalizes understanding of instructions provided today and agrees to view in [place].   Plan: Face to Face appointment with care management team member scheduled for: 10 weeks  [name], PharmD, [place], [place] Clinical Pharmacist [employer] at [facility] [phone]

## 2020-09-14 NOTE — Chronic Care Management (AMB) (Signed)
Chronic Care Management Pharmacy Note  09/14/2020 Name:  Emily Ryan MRN:  [id number] DOB:  [birth year]   Subjective: Emily Ryan is an 75 y.o. year old female who is a primary patient of Derrel Nip, Aris Everts, MD.  The CCM team was consulted for assistance with disease management and care coordination needs.    Care coordination  for  medication access  in response to provider referral for pharmacy case management and/or care coordination services.   Consent to Services:  The patient was given information about Chronic Care Management services, agreed to services, and gave verbal consent prior to initiation of services.  Please see initial visit note for detailed documentation.   Patient Care Team: Crecencio Mc, MD as PCP - General (Internal Medicine) Minna Merritts, MD as Consulting Physician (Cardiology) Lin Landsman, MD as Consulting Physician (Gastroenterology) Michael Boston, MD as Consulting Physician (General Surgery) [name], RPH-CPP (Pharmacist)   Objective:  Lab Results  Component Value Date   CREATININE 0.61 07/29/2020   CREATININE 0.62 05/27/2020   CREATININE 0.63 04/16/2020    Lab Results  Component Value Date   HGBA1C 8.0 (H) 07/29/2020   Last diabetic Eye exam:  Lab Results  Component Value Date/Time   HMDIABEYEEXA No Retinopathy 06/12/2018 12:00 AM    Last diabetic Foot exam:  Lab Results  Component Value Date/Time   HMDIABFOOTEX normal 01/27/2014 12:00 AM        Component Value Date/Time   CHOL 161 07/29/2020 0813   CHOL 155 01/08/2019 1122   TRIG 168.0 (H) 07/29/2020 0813   TRIG 218 (H) 01/08/2019 1122   HDL 47.20 07/29/2020 0813   CHOLHDL 3 07/29/2020 0813   VLDL 33.6 07/29/2020 0813   LDLCALC 80 07/29/2020 0813   LDLDIRECT 72.0 04/12/2018 0825    Hepatic Function Latest Ref Rng & Units 07/29/2020 05/27/2020 04/16/2020  Total Protein 6.0 - 8.3 g/dL 6.8 6.7 6.8  Albumin 3.5 - 5.2 g/dL 4.0 4.2 3.9  AST 0 - 37  U/L _0 ALT 0 - 35 U/L 36(H) 32 32  Alk Phosphatase 39 - 117 U/L 86 94 91  Total Bilirubin 0.2 - 1.2 mg/dL 0.5 0.6 0.7  Bilirubin, Direct 0.0 - 0.3 mg/dL - - -    Lab Results  Component Value Date/Time   TSH 3.11 01/14/2020 08:02 AM   TSH 1.62 02/04/2015 01:37 PM    CBC Latest Ref Rng & Units 07/29/2020 05/27/2020 02/19/2020  WBC 4.0 - 10.5 K/uL 6.0 6.2 5.7  Hemoglobin 12.0 - 15.0 g/dL 13.6 14.5 14.5  Hematocrit 36.0 - 46.0 % 40.2 42.3 42.2  Platelets 150.0 - 400.0 K/uL 290.0 263.0 239    Lab Results  Component Value Date/Time   VD25OH 35.87 07/29/2020 08:13 AM   VD25OH 34.28 11/29/2017 09:15 AM    Clinical ASCVD: No  The 10-year ASCVD risk score (Arnett DK, et al., 2019) is: 24.6%   Values used to calculate the score:     Age: 35 years     Sex: Female     Is Non-Hispanic African American: No     Diabetic: Yes     Tobacco smoker: No     Systolic Blood Pressure: 254 mmHg     Is BP treated: Yes     HDL Cholesterol: 47.2 mg/dL     Total Cholesterol: 161 mg/dL     Social History   Tobacco Use  Smoking Status Never  Smokeless Tobacco Never  Tobacco Comments   father growing up   BP Readings from Last 3 Encounters:  07/30/20 (!) 142/68  06/12/20 (!) 150/70  06/12/20 (!) 163/81   Pulse Readings from Last 3 Encounters:  07/30/20 83  06/12/20 79  06/12/20 73   Wt Readings from Last 3 Encounters:  08/26/20 132 lb (59.9 kg)  07/30/20 132 lb 6.4 oz (60.1 kg)  06/12/20 132 lb (59.9 kg)    Assessment: Review of patient past medical history, allergies, medications, health status, including review of consultants reports, laboratory and other test data, was performed as part of comprehensive evaluation and provision of chronic care management services.   SDOH:  (Social Determinants of Health) assessments and interventions performed:  SDOH Interventions    Flowsheet Row Most Recent Value  SDOH Interventions   Financial Strain Interventions Intervention Not  Indicated, Other (Comment)  [Completing appeal for Ozempic]       CCM Care Plan  Allergies  Allergen Reactions   Prednisone Hives    Medications Reviewed Today     Reviewed by Dia Crawford, LPN (Licensed Practical Nurse) on 08/26/20 at 1316  Med List Status: <None>   Medication Order Taking? Sig Documenting Provider Last Dose Status Informant  amLODipine (NORVASC) 2.5 MG tablet [id number] No Take 1 tablet (2.5 mg total) by mouth every evening. Crecencio Mc, MD Taking Active   Ascorbic Acid (VITAMIN C) 1000 MG tablet [id number] No Take 1,000 mg by mouth daily.  [provider] Taking Active Self  aspirin EC 81 MG tablet [id number] No Take 81 mg by mouth at bedtime. Swallow whole. [provider] Taking Active Self  Calcium Carbonate-Vitamin D3 600-400 MG-UNIT TABS [id number] No Take 1 tablet by mouth daily. [provider] Taking Active Self  clobetasol cream (TEMOVATE) 0.05 % [id number] No Apply 1 application topically 2 (two) times daily as needed (psoriasis). [provider] Taking Active Self  conjugated estrogens (PREMARIN) vaginal cream [id number] No Place 1 Applicatorful vaginally daily.  Patient taking differently: Place 1 Applicatorful vaginally daily as needed (dryness/irritation).   Rubie Maid, MD Taking Active   cyclobenzaprine (FLEXERIL) 5 MG tablet [id number] No Take 1 tablet (5 mg total) by mouth 3 (three) times daily as needed.  Patient not taking: Reported on 08/18/2020   Crecencio Mc, MD Not Taking Active   escitalopram (LEXAPRO) 5 MG tablet [id number] No Take 1 tablet (5 mg total) by mouth every evening. Crecencio Mc, MD Taking Active   folic acid (FOLVITE) 1 MG tablet [id number] No Take 1 tablet (1 mg total) by mouth See admin instructions. Takes daily except on Saturday Crecencio Mc, MD Taking Active   glipiZIDE (GLUCOTROL) 5 MG tablet [id number]  Take 1 tablet (5 mg total) by mouth daily before breakfast. Crecencio Mc, MD  Active   glucose blood (FREESTYLE LITE) test strip [id number] No Use as instructed to test blood sugar twice daily Crecencio Mc, MD Taking Active Self  hydrochlorothiazide (HYDRODIURIL) 12.5 MG tablet [id number] No Take 1 tablet (12.5 mg total) by mouth daily. Crecencio Mc, MD Taking Active Self  loratadine (CLARITIN) 10 MG tablet [id number] No Take 1 tablet (10 mg total) by mouth daily. Crecencio Mc, MD Taking Active   metFORMIN (GLUCOPHAGE XR) 750 MG 24 hr tablet [id number] No Take 2 tablets (1,500 mg total) by mouth daily with breakfast. Crecencio Mc, MD Taking Active   methotrexate (RHEUMATREX) 2.5 MG tablet [id number] No Take 3 tablets (7.5 mg total) by  mouth once a week. Caution:Chemotherapy. Protect from light. Crecencio Mc, MD Taking Active   montelukast (SINGULAIR) 10 MG tablet [id number] No Take 1 tablet (10 mg total) by mouth at bedtime. Crecencio Mc, MD Taking Active Self  Multiple Vitamin (MULTIVITAMIN) capsule [id number] No Take 1 capsule by mouth daily. [provider] Taking Active Self  omeprazole (PRILOSEC) 20 MG capsule [id number] No Take 1 capsule (20 mg total) by mouth daily. Crecencio Mc, MD Taking Active   Precision Thin Lancets MISC [id number] No Use as directed to check sugars twice daily Crecencio Mc, MD Taking Active Self  rosuvastatin (CRESTOR) 10 MG tablet [id number] No Take 1 tablet (10 mg total) by mouth daily. Crecencio Mc, MD Taking Active   Semaglutide,0.25 or 0.5MG /DOS, (OZEMPIC, 0.25 OR 0.5 MG/DOSE,) 2 MG/1.5ML SOPN [id number]  Inject 0.25 mg weekly for 2 weeks then increase to 0.5 mg weekly Crecencio Mc, MD  Active   SUPREP BOWEL PREP KIT 17.5-3.13-1.6 GM/177ML SOLN [id number] No SMARTSIG:354 Milliliter(s) By Mouth Once  Patient not taking: Reported on 08/18/2020   [provider] Not Taking Active   telmisartan (MICARDIS) 80 MG tablet [id number] No Take 1 tablet (80 mg total) by mouth daily. Crecencio Mc, MD Taking  Active            Med Note Mayo Ao Aug 24, 2020  4:58 PM)    traMADol (ULTRAM) 50 MG tablet [id number] No Take 1 tablet (50 mg total) by mouth every 6 (six) hours as needed (Breakthrough pain).  Patient not taking: No sig reported   Poggi, Marshall Cork, MD Not Taking Active   vitamin E 400 UNIT capsule [id number] No Take 400 Units by mouth daily. [provider] Taking Active Self            Patient Active Problem List   Diagnosis Date Noted   Lumbar degenerative disc disease 07/31/2020   Leg pain, posterior, left 05/27/2020   S/P shoulder replacement, right 04/24/2020   Status post reverse total shoulder replacement, right 04/06/2020   Anastomotic stricture of colorectal region 03/10/2020   Hemorrhage of colon due to diverticulosis 03/10/2020   Rotator cuff arthropathy, right 11/25/2019   Travel advice encounter 10/15/2019   Fall at home, initial encounter 10/14/2019   Atherosclerosis of aorta ([place]) 10/14/2019   Vaginal atrophy 08/20/2019   Dyspareunia in female 08/20/2019   Perineal rash in female 04/12/2019   Hearing loss 04/12/2019   Breast calcifications on mammogram 09/13/2018   Family history of Alzheimer's disease 08/13/2018   Macrocytosis without anemia 04/14/2018   Hx of colonic polyps    S/P partial colectomy 08/05/2017   Tubular adenoma of colon 08/03/2017   Overweight (BMI 25.0-29.9) 05/04/2016   Basal cell carcinoma of right forehead 08/28/2015   Patent foramen ovale with right to left shunt 08/28/2015   Postmenopausal estrogen deficiency 05/07/2015   DM type 2 with diabetic mixed hyperlipidemia ([place]) 02/08/2015   Uncontrolled type 2 diabetes mellitus ([place]) 02/08/2015   Allergic rhinitis 01/28/2013   Basal cell carcinoma of left side of nose 04/28/2012   Long-term use of high-risk medication 10/21/2011   Routine general medical examination at a health care facility 07/18/2011   Guttate psoriasis 04/04/2011   Constipation 01/19/2011    Reflux esophagitis 01/19/2011   Diverticulosis of sigmoid colon 11/10/2010   White coat syndrome with diagnosis of hypertension 09/07/2010   Hyperlipidemia associated with type 2 diabetes mellitus ([place]) 09/07/2010   Hyperlipidemia  09/07/2010    Immunization History  Administered Date(s) Administered   Fluad Quad(high Dose 65+) 10/03/2018   Influenza Split 10/19/2011   Influenza, High Dose Seasonal PF 10/01/2014, 10/06/2016, 09/06/2017   Influenza,inj,Quad PF,6+ Mos 10/31/2012, 09/26/2013, 08/26/2015   Influenza-Unspecified 10/07/2019   PFIZER(Purple Top)SARS-COV-2 Vaccination 02/12/2019, 03/05/2019, 10/09/2019, 04/08/2020   Pneumococcal Conjugate-13 01/28/2013   Pneumococcal Polysaccharide-23 11/02/2010, 05/04/2016   Tdap 11/16/2010   Zoster Recombinat (Shingrix) 06/25/2017, 09/06/2017   Zoster, Live 10/19/2007    Conditions to be addressed/monitored: HTN, HLD, and DMII  Care Plan : Medication Management  Updates made by [name], RPH-CPP since 09/14/2020 12:00 AM     Problem: Diabetes, HTN, HLD, psoriasis      Long-Range Goal: Disease Progression Prevention   Start Date: 08/18/2020  This Visit's Progress: On track  Recent Progress: On track  Priority: High  Note:   Current Barriers:  Unable to achieve control of diabetes   Pharmacist Clinical Goal(s):  Over the next 90 days, patient will achieve control of diabetes as evidenced by A1c  through collaboration with PharmD and provider.  Interventions: 1:1 collaboration with Crecencio Mc, MD regarding development and update of comprehensive plan of care as evidenced by provider attestation and co-signature Inter-disciplinary care team collaboration (see longitudinal plan of care) Comprehensive medication review performed; medication list updated in electronic medical record  SDOH: [employer] for [employer]. No medication cost concerns.   Health Maintenance: Due for DM eye exam. Will discuss moving forward.    Diabetes: Uncontrolled; current treatment: metformin XR 1500 mg daily, Ozempic 0.25 mg weekly; glipizide 5 mg daily;  Current home glucose readings: fastings 135-140s; 2 hour post prandial: 180-190s. Reports feeling better on lower dose of glipizide.  Faxed appeal with patient signature back into [employer] on 09/02/20. Patient outreached over the weekend. Contacted [employer] - they note appeal has not been processed, they recommended calling back in later this week. Will call back in 2 days  Hypertension: Uncontrolled at last clinic; current treatment: amlodipine 2.5 mg daily, telmisartan 80 mg daily, HCTZ 12.5 mg daily Previously recommended to continue current regimen  Hyperlipidemia, Aortic Atherosclerosis: Could consider tighter control given DM + aortic atherosclerosis, as well as risk factor of inflammatory conditions; current treatment: rosuvastatin 10 mg daily Current antiplatelet regimen: aspirin 81 mg daily Previously recommended to continue current regimen  Depression: Controlled per patient report; current regimen: escitalopram 5 mg daily Previously recommended to continue current regimen at this time  Psoriasis: Controlled per patient report; current regimen: methotrexate 7.5 mg once weekly, folic acid 1 mg daily Previously recommended to continue current regimen at this time along with collaboration with dermatology  Allergies: Controlled per patient report; current regimen: loratadine 10 mg daily, montelukast 10 mg daily Previously recommended to continue current regimen at this time  GERD: Controlled per patient report; current regimen: omeprazole 20 mg daily Previously recommended to continue current regimen at this time. Evaluate ability to reduce to PRN PPI or H2RA therapy moving forward.   Atrophic Vaginitis: Controlled per patient report; current regimen: premarin vaginal cream PRN Previously recommended to continue current regimen at this  time.  Supplements: Vitamin D, Vitamin C, Calcium, MVI  Patient Goals/Self-Care Activities Over the next 90 days, patient will:  - take medications as prescribed check glucose twice daily, document, and provide at future appointments check blood pressure perioidically, document, and provide at future appointments  Follow Up Plan: Face to Face appointment with care management team member scheduled for:  ~10 weeks  w/ PCP visit     Medication Assistance: None required.  Patient affirms current coverage meets needs.  Patient's preferred pharmacy is:  [id number] [address], [place] [address] Phone: [phone] Fax: [phone]  [facility] [employer], [place] [address] [place] [address] Phone: [phone] Fax: [phone]  Follow Up:  Patient agrees to Care Plan and Follow-up.  Plan: Face to Face appointment with care management team member scheduled for: 10 weeks  [name], PharmD, [place], [place] Clinical Pharmacist [employer] at [facility] [phone]

## 2020-09-16 ENCOUNTER — Ambulatory Visit (INDEPENDENT_AMBULATORY_CARE_PROVIDER_SITE_OTHER): Payer: Medicare Other | Admitting: Pharmacist

## 2020-09-16 DIAGNOSIS — E1169 Type 2 diabetes mellitus with other specified complication: Secondary | ICD-10-CM

## 2020-09-16 DIAGNOSIS — I700 Atherosclerosis of aorta: Secondary | ICD-10-CM

## 2020-09-16 NOTE — Patient Instructions (Signed)
Visit Information  PATIENT GOALS:  Goals Addressed               This Visit's Progress     Patient Stated     Medication Monitoring (pt-stated)        Patient Goals/Self-Care Activities Over the next 90 days, patient will:  - take medications as prescribed check glucose twice daily, document, and provide at future appointments check blood pressure perioidically, document, and provide at future appointments          The patient verbalized understanding of instructions, educational materials, and care plan provided today and agreed to receive a mailed copy of patient instructions, educational materials, and care plan.    Plan: Face to Face appointment with care management team member scheduled for: 10 weeks  [name], PharmD, [place], [place] Clinical Pharmacist [employer] at [facility] [phone]

## 2020-09-16 NOTE — Chronic Care Management (AMB) (Signed)
Chronic Care Management Pharmacy Note  09/16/2020 Name:  Emily Ryan MRN:  [id number] DOB:  08-04-1945   Subjective: Emily Ryan is an 75 y.o. year old female who is a primary patient of Tullo, Aris Everts, MD.  The CCM team was consulted for assistance with disease management and care coordination needs.    Care coordination  for  medication access  in response to provider referral for pharmacy case management and/or care coordination services.   Consent to Services:  The patient was given information about Chronic Care Management services, agreed to services, and gave verbal consent prior to initiation of services.  Please see initial visit note for detailed documentation.   Patient Care Team: Crecencio Mc, MD as PCP - General (Internal Medicine) Minna Merritts, MD as Consulting Physician (Cardiology) Lin Landsman, MD as Consulting Physician (Gastroenterology) Michael Boston, MD as Consulting Physician (General Surgery) [name], RPH-CPP (Pharmacist)  Objective:  Lab Results  Component Value Date   CREATININE 0.61 07/29/2020   CREATININE 0.62 05/27/2020   CREATININE 0.63 04/16/2020    Lab Results  Component Value Date   HGBA1C 8.0 (H) 07/29/2020   Last diabetic Eye exam:  Lab Results  Component Value Date/Time   HMDIABEYEEXA No Retinopathy 06/12/2018 12:00 AM    Last diabetic Foot exam:  Lab Results  Component Value Date/Time   HMDIABFOOTEX normal 01/27/2014 12:00 AM        Component Value Date/Time   CHOL 161 07/29/2020 0813   CHOL 155 01/08/2019 1122   TRIG 168.0 (H) 07/29/2020 0813   TRIG 218 (H) 01/08/2019 1122   HDL 47.20 07/29/2020 0813   CHOLHDL 3 07/29/2020 0813   VLDL 33.6 07/29/2020 0813   LDLCALC 80 07/29/2020 0813   LDLDIRECT 72.0 04/12/2018 0825    Hepatic Function Latest Ref Rng & Units 07/29/2020 05/27/2020 04/16/2020  Total Protein 6.0 - 8.3 g/dL 6.8 6.7 6.8  Albumin 3.5 - 5.2 g/dL 4.0 4.2 3.9  AST 0 - 37  U/L 24 22 22   ALT 0 - 35 U/L 36(H) 32 32  Alk Phosphatase 39 - 117 U/L 86 94 91  Total Bilirubin 0.2 - 1.2 mg/dL 0.5 0.6 0.7  Bilirubin, Direct 0.0 - 0.3 mg/dL - - -    Lab Results  Component Value Date/Time   TSH 3.11 01/14/2020 08:02 AM   TSH 1.62 02/04/2015 01:37 PM    CBC Latest Ref Rng & Units 07/29/2020 05/27/2020 02/19/2020  WBC 4.0 - 10.5 K/uL 6.0 6.2 5.7  Hemoglobin 12.0 - 15.0 g/dL 13.6 14.5 14.5  Hematocrit 36.0 - 46.0 % 40.2 42.3 42.2  Platelets 150.0 - 400.0 K/uL 290.0 263.0 239    Lab Results  Component Value Date/Time   VD25OH 35.87 07/29/2020 08:13 AM   VD25OH 34.28 11/29/2017 09:15 AM    Clinical ASCVD: Yes  The 10-year ASCVD risk score (Arnett DK, et al., 2019) is: 24.6%   Values used to calculate the score:     Age: 64 years     Sex: Female     Is Non-Hispanic African American: No     Diabetic: Yes     Tobacco smoker: No     Systolic Blood Pressure: 767 mmHg     Is BP treated: Yes     HDL Cholesterol: 47.2 mg/dL     Total Cholesterol: 161 mg/dL     Social History   Tobacco Use  Smoking Status Never  Smokeless Tobacco Never  Tobacco  Comments   father growing up   BP Readings from Last 3 Encounters:  07/30/20 (!) 142/68  06/12/20 (!) 150/70  06/12/20 (!) 163/81   Pulse Readings from Last 3 Encounters:  07/30/20 83  06/12/20 79  06/12/20 73   Wt Readings from Last 3 Encounters:  08/26/20 132 lb (59.9 kg)  07/30/20 132 lb 6.4 oz (60.1 kg)  06/12/20 132 lb (59.9 kg)    Assessment: Review of patient past medical history, allergies, medications, health status, including review of consultants reports, laboratory and other test data, was performed as part of comprehensive evaluation and provision of chronic care management services.   SDOH:  (Social Determinants of Health) assessments and interventions performed:    CCM Care Plan  Allergies  Allergen Reactions   Prednisone Hives    Medications Reviewed Today     Reviewed by  Dia Crawford, LPN (Licensed Practical Nurse) on 08/26/20 at 69  Med List Status: <None>   Medication Order Taking? Sig Documenting Provider Last Dose Status Informant  amLODipine (NORVASC) 2.5 MG tablet [id number] No Take 1 tablet (2.5 mg total) by mouth every evening. Crecencio Mc, MD Taking Active   Ascorbic Acid (VITAMIN C) 1000 MG tablet [id number] No Take 1,000 mg by mouth daily.  [provider] Taking Active Self  aspirin EC 81 MG tablet [id number] No Take 81 mg by mouth at bedtime. Swallow whole. [provider] Taking Active Self  Calcium Carbonate-Vitamin D3 600-400 MG-UNIT TABS [id number] No Take 1 tablet by mouth daily. [provider] Taking Active Self  clobetasol cream (TEMOVATE) 0.05 % [id number] No Apply 1 application topically 2 (two) times daily as needed (psoriasis). [provider] Taking Active Self  conjugated estrogens (PREMARIN) vaginal cream [id number] No Place 1 Applicatorful vaginally daily.  Patient taking differently: Place 1 Applicatorful vaginally daily as needed (dryness/irritation).   Rubie Maid, MD Taking Active   cyclobenzaprine (FLEXERIL) 5 MG tablet [id number] No Take 1 tablet (5 mg total) by mouth 3 (three) times daily as needed.  Patient not taking: Reported on 08/18/2020   Crecencio Mc, MD Not Taking Active   escitalopram (LEXAPRO) 5 MG tablet [id number] No Take 1 tablet (5 mg total) by mouth every evening. Crecencio Mc, MD Taking Active   folic acid (FOLVITE) 1 MG tablet [id number] No Take 1 tablet (1 mg total) by mouth See admin instructions. Takes daily except on Saturday Crecencio Mc, MD Taking Active   glipiZIDE (GLUCOTROL) 5 MG tablet [id number]  Take 1 tablet (5 mg total) by mouth daily before breakfast. Crecencio Mc, MD  Active   glucose blood (FREESTYLE LITE) test strip [id number] No Use as instructed to test blood sugar twice daily Crecencio Mc, MD Taking Active Self  hydrochlorothiazide  (HYDRODIURIL) 12.5 MG tablet [id number] No Take 1 tablet (12.5 mg total) by mouth daily. Crecencio Mc, MD Taking Active Self  loratadine (CLARITIN) 10 MG tablet [id number] No Take 1 tablet (10 mg total) by mouth daily. Crecencio Mc, MD Taking Active   metFORMIN (GLUCOPHAGE XR) 750 MG 24 hr tablet [id number] No Take 2 tablets (1,500 mg total) by mouth daily with breakfast. Crecencio Mc, MD Taking Active   methotrexate (RHEUMATREX) 2.5 MG tablet [id number] No Take 3 tablets (7.5 mg total) by mouth once a week. Caution:Chemotherapy. Protect from light. Crecencio Mc, MD Taking Active   montelukast (SINGULAIR) 10 MG tablet [id number] No Take 1 tablet (10 mg total) by mouth at bedtime.  Crecencio Mc, MD Taking Active Self  Multiple Vitamin (MULTIVITAMIN) capsule [id number] No Take 1 capsule by mouth daily. [provider] Taking Active Self  omeprazole (PRILOSEC) 20 MG capsule [id number] No Take 1 capsule (20 mg total) by mouth daily. Crecencio Mc, MD Taking Active   Precision Thin Lancets MISC [id number] No Use as directed to check sugars twice daily Crecencio Mc, MD Taking Active Self  rosuvastatin (CRESTOR) 10 MG tablet [id number] No Take 1 tablet (10 mg total) by mouth daily. Crecencio Mc, MD Taking Active   Semaglutide,0.25 or 0.5MG/DOS, (OZEMPIC, 0.25 OR 0.5 MG/DOSE,) 2 MG/1.5ML SOPN [id number]  Inject 0.25 mg weekly for 2 weeks then increase to 0.5 mg weekly Crecencio Mc, MD  Active   SUPREP BOWEL PREP KIT 17.5-3.13-1.6 GM/177ML SOLN [id number] No SMARTSIG:354 Milliliter(s) By Mouth Once  Patient not taking: Reported on 08/18/2020   [provider] Not Taking Active   telmisartan (MICARDIS) 80 MG tablet [id number] No Take 1 tablet (80 mg total) by mouth daily. Crecencio Mc, MD Taking Active            Med Note Mayo Ao Aug 24, 2020  4:58 PM)    traMADol (ULTRAM) 50 MG tablet [id number] No Take 1 tablet (50 mg total) by mouth every 6 (six) hours as  needed (Breakthrough pain).  Patient not taking: No sig reported   Poggi, Marshall Cork, MD Not Taking Active   vitamin E 400 UNIT capsule [id number] No Take 400 Units by mouth daily. [provider] Taking Active Self            Patient Active Problem List   Diagnosis Date Noted   Lumbar degenerative disc disease 07/31/2020   Leg pain, posterior, left 05/27/2020   S/P shoulder replacement, right 04/24/2020   Status post reverse total shoulder replacement, right 04/06/2020   Anastomotic stricture of colorectal region 03/10/2020   Hemorrhage of colon due to diverticulosis 03/10/2020   Rotator cuff arthropathy, right 11/25/2019   Travel advice encounter 10/15/2019   Fall at home, initial encounter 10/14/2019   Atherosclerosis of aorta ([place]) 10/14/2019   Vaginal atrophy 08/20/2019   Dyspareunia in female 08/20/2019   Perineal rash in female 04/12/2019   Hearing loss 04/12/2019   Breast calcifications on mammogram 09/13/2018   Family history of Alzheimer's disease 08/13/2018   Macrocytosis without anemia 04/14/2018   Hx of colonic polyps    S/P partial colectomy 08/05/2017   Tubular adenoma of colon 08/03/2017   Overweight (BMI 25.0-29.9) 05/04/2016   Basal cell carcinoma of right forehead 08/28/2015   Patent foramen ovale with right to left shunt 08/28/2015   Postmenopausal estrogen deficiency 05/07/2015   DM type 2 with diabetic mixed hyperlipidemia ([place]) 02/08/2015   Uncontrolled type 2 diabetes mellitus ([place]) 02/08/2015   Allergic rhinitis 01/28/2013   Basal cell carcinoma of left side of nose 04/28/2012   Long-term use of high-risk medication 10/21/2011   Routine general medical examination at a health care facility 07/18/2011   Guttate psoriasis 04/04/2011   Constipation 01/19/2011   Reflux esophagitis 01/19/2011   Diverticulosis of sigmoid colon 11/10/2010   White coat syndrome with diagnosis of hypertension 09/07/2010   Hyperlipidemia associated with type 2 diabetes  mellitus ([place]) 09/07/2010   Hyperlipidemia 09/07/2010    Immunization History  Administered Date(s) Administered   Fluad Quad(high Dose 65+) 10/03/2018   Influenza Split 10/19/2011   Influenza, High Dose Seasonal PF 10/01/2014, 10/06/2016, 09/06/2017  Influenza,inj,Quad PF,6+ Mos 10/31/2012, 09/26/2013, 08/26/2015   Influenza-Unspecified 10/07/2019   PFIZER(Purple Top)SARS-COV-2 Vaccination 02/12/2019, 03/05/2019, 10/09/2019, 04/08/2020   Pneumococcal Conjugate-13 01/28/2013   Pneumococcal Polysaccharide-23 11/02/2010, 05/04/2016   Tdap 11/16/2010   Zoster Recombinat (Shingrix) 06/25/2017, 09/06/2017   Zoster, Live 10/19/2007    Conditions to be addressed/monitored: HTN, HLD, and DMII  Care Plan : Medication Management  Updates made by [name], RPH-CPP since 09/16/2020 12:00 AM     Problem: Diabetes, HTN, HLD, psoriasis      Long-Range Goal: Disease Progression Prevention   Start Date: 08/18/2020  This Visit's Progress: On track  Recent Progress: On track  Priority: High  Note:   Current Barriers:  Unable to achieve control of diabetes   Pharmacist Clinical Goal(s):  Over the next 90 days, patient will achieve control of diabetes as evidenced by A1c  through collaboration with PharmD and provider.  Interventions: 1:1 collaboration with Crecencio Mc, MD regarding development and update of comprehensive plan of care as evidenced by provider attestation and co-signature Inter-disciplinary care team collaboration (see longitudinal plan of care) Comprehensive medication review performed; medication list updated in electronic medical record  SDOH: [employer]. No medication cost concerns.   Health Maintenance: Due for DM eye exam. Will discuss moving forward.   Diabetes: Uncontrolled; current treatment: metformin XR 1500 mg daily, Ozempic 0.25 mg weekly; glipizide 5 mg daily;  Current home glucose readings: fastings 135-140s; 2 hour post prandial:  180-190s. Reports feeling better on lower dose of glipizide.  Faxed appeal with patient signature back into [employer] on 09/02/20. Contacted [employer] to follow up on status of appeal (appeal phone is [phone]). They have not made a decision yet and note they have until 9/21 to make a decision. Will call back if I have not heard back by that date.   Hypertension: Uncontrolled at last clinic; current treatment: amlodipine 2.5 mg daily, telmisartan 80 mg daily, HCTZ 12.5 mg daily Previously recommended to continue current regimen  Hyperlipidemia, Aortic Atherosclerosis: Could consider tighter control given DM + aortic atherosclerosis, as well as risk factor of inflammatory conditions; current treatment: rosuvastatin 10 mg daily Current antiplatelet regimen: aspirin 81 mg daily Previously recommended to continue current regimen  Depression: Controlled per patient report; current regimen: escitalopram 5 mg daily Previously recommended to continue current regimen at this time  Psoriasis: Controlled per patient report; current regimen: methotrexate 7.5 mg once weekly, folic acid 1 mg daily Previously recommended to continue current regimen at this time along with collaboration with dermatology  Allergies: Controlled per patient report; current regimen: loratadine 10 mg daily, montelukast 10 mg daily Previously recommended to continue current regimen at this time  GERD: Controlled per patient report; current regimen: omeprazole 20 mg daily Previously recommended to continue current regimen at this time. Evaluate ability to reduce to PRN PPI or H2RA therapy moving forward.   Atrophic Vaginitis: Controlled per patient report; current regimen: premarin vaginal cream PRN Previously recommended to continue current regimen at this time.  Supplements: Vitamin D, Vitamin C, Calcium, MVI  Patient Goals/Self-Care Activities Over the next 90 days, patient will:  -  take medications as prescribed check glucose twice daily, document, and provide at future appointments check blood pressure perioidically, document, and provide at future appointments  Follow Up Plan: Face to Face appointment with care management team member scheduled for:  ~10 weeks w/ PCP visit     Medication Assistance: None required.  Patient affirms current coverage meets needs.  Patient's preferred pharmacy is:  [id number] [address], [place] [address] Phone: [phone] Fax: [phone]  [address] [place], [place] [address] [place] [address] Phone: [phone] Fax: [phone]   Follow Up:  Patient agrees to Care Plan and Follow-up.  Plan: Face to Face appointment with care management team member scheduled for: 10 weeks  [name], PharmD, [place], [place] Clinical Pharmacist [employer] at [facility] [phone]

## 2020-09-24 ENCOUNTER — Ambulatory Visit: Payer: Medicare Other | Admitting: Pharmacist

## 2020-09-24 DIAGNOSIS — E785 Hyperlipidemia, unspecified: Secondary | ICD-10-CM

## 2020-09-24 DIAGNOSIS — E1169 Type 2 diabetes mellitus with other specified complication: Secondary | ICD-10-CM

## 2020-09-24 DIAGNOSIS — I700 Atherosclerosis of aorta: Secondary | ICD-10-CM

## 2020-09-24 NOTE — Chronic Care Management (AMB) (Signed)
Chronic Care Management Pharmacy Note  09/24/2020 Name:  Emily Ryan MRN:  [id number] DOB:  1945-01-16  Subjective: Emily Ryan is an 75 y.o. year old female who is a primary patient of Derrel Nip, Aris Everts, MD.  The CCM team was consulted for assistance with disease management and care coordination needs.    Care coordination for  medication access  in response to provider referral for pharmacy case management and/or care coordination services.   Consent to Services:  The patient was given information about Chronic Care Management services, agreed to services, and gave verbal consent prior to initiation of services.  Please see initial visit note for detailed documentation.   Patient Care Team: Crecencio Mc, MD as PCP - General (Internal Medicine) Minna Merritts, MD as Consulting Physician (Cardiology) Lin Landsman, MD as Consulting Physician (Gastroenterology) Michael Boston, MD as Consulting Physician (General Surgery) [name], RPH-CPP (Pharmacist) Objective:  Lab Results  Component Value Date   CREATININE 0.61 07/29/2020   CREATININE 0.62 05/27/2020   CREATININE 0.63 04/16/2020    Lab Results  Component Value Date   HGBA1C 8.0 (H) 07/29/2020   Last diabetic Eye exam:  Lab Results  Component Value Date/Time   HMDIABEYEEXA No Retinopathy 06/12/2018 12:00 AM    Last diabetic Foot exam:  Lab Results  Component Value Date/Time   HMDIABFOOTEX normal 01/27/2014 12:00 AM        Component Value Date/Time   CHOL 161 07/29/2020 0813   CHOL 155 01/08/2019 1122   TRIG 168.0 (H) 07/29/2020 0813   TRIG 218 (H) 01/08/2019 1122   HDL 47.20 07/29/2020 0813   CHOLHDL 3 07/29/2020 0813   VLDL 33.6 07/29/2020 0813   LDLCALC 80 07/29/2020 0813   LDLDIRECT 72.0 04/12/2018 0825    Hepatic Function Latest Ref Rng & Units 07/29/2020 05/27/2020 04/16/2020  Total Protein 6.0 - 8.3 g/dL 6.8 6.7 6.8  Albumin 3.5 - 5.2 g/dL 4.0 4.2 3.9  AST 0 - 37 U/L _0 ALT 0 - 35 U/L 36(H) 32 32  Alk Phosphatase 39 - 117 U/L 86 94 91  Total Bilirubin 0.2 - 1.2 mg/dL 0.5 0.6 0.7  Bilirubin, Direct 0.0 - 0.3 mg/dL - - -    Lab Results  Component Value Date/Time   TSH 3.11 01/14/2020 08:02 AM   TSH 1.62 02/04/2015 01:37 PM    CBC Latest Ref Rng & Units 07/29/2020 05/27/2020 02/19/2020  WBC 4.0 - 10.5 K/uL 6.0 6.2 5.7  Hemoglobin 12.0 - 15.0 g/dL 13.6 14.5 14.5  Hematocrit 36.0 - 46.0 % 40.2 42.3 42.2  Platelets 150.0 - 400.0 K/uL 290.0 263.0 239    Lab Results  Component Value Date/Time   VD25OH 35.87 07/29/2020 08:13 AM   VD25OH 34.28 11/29/2017 09:15 AM     Social History   Tobacco Use  Smoking Status Never  Smokeless Tobacco Never  Tobacco Comments   father growing up   BP Readings from Last 3 Encounters:  07/30/20 (!) 142/68  06/12/20 (!) 150/70  06/12/20 (!) 163/81   Pulse Readings from Last 3 Encounters:  07/30/20 83  06/12/20 79  06/12/20 73   Wt Readings from Last 3 Encounters:  08/26/20 132 lb (59.9 kg)  07/30/20 132 lb 6.4 oz (60.1 kg)  06/12/20 132 lb (59.9 kg)    Assessment: Review of patient past medical history, allergies, medications, health status, including review of consultants reports, laboratory and other test data, was performed as  part of comprehensive evaluation and provision of chronic care management services.   SDOH:  (Social Determinants of Health) assessments and interventions performed:  SDOH Interventions    Flowsheet Row Most Recent Value  SDOH Interventions   Financial Strain Interventions Intervention Not Indicated       CCM Care Plan  Allergies  Allergen Reactions   Prednisone Hives    Medications Reviewed Today     Reviewed by Dia Crawford, LPN (Licensed Practical Nurse) on 08/26/20 at 1316  Med List Status: <None>   Medication Order Taking? Sig Documenting Provider Last Dose Status Informant  amLODipine (NORVASC) 2.5 MG tablet [id number] No Take 1 tablet (2.5  mg total) by mouth every evening. Crecencio Mc, MD Taking Active   Ascorbic Acid (VITAMIN C) 1000 MG tablet [id number] No Take 1,000 mg by mouth daily.  [provider] Taking Active Self  aspirin EC 81 MG tablet [id number] No Take 81 mg by mouth at bedtime. Swallow whole. [provider] Taking Active Self  Calcium Carbonate-Vitamin D3 600-400 MG-UNIT TABS [id number] No Take 1 tablet by mouth daily. [provider] Taking Active Self  clobetasol cream (TEMOVATE) 0.05 % [id number] No Apply 1 application topically 2 (two) times daily as needed (psoriasis). [provider] Taking Active Self  conjugated estrogens (PREMARIN) vaginal cream [id number] No Place 1 Applicatorful vaginally daily.  Patient taking differently: Place 1 Applicatorful vaginally daily as needed (dryness/irritation).   Rubie Maid, MD Taking Active   cyclobenzaprine (FLEXERIL) 5 MG tablet [id number] No Take 1 tablet (5 mg total) by mouth 3 (three) times daily as needed.  Patient not taking: Reported on 08/18/2020   Crecencio Mc, MD Not Taking Active   escitalopram (LEXAPRO) 5 MG tablet [id number] No Take 1 tablet (5 mg total) by mouth every evening. Crecencio Mc, MD Taking Active   folic acid (FOLVITE) 1 MG tablet [id number] No Take 1 tablet (1 mg total) by mouth See admin instructions. Takes daily except on Saturday Crecencio Mc, MD Taking Active   glipiZIDE (GLUCOTROL) 5 MG tablet [id number]  Take 1 tablet (5 mg total) by mouth daily before breakfast. Crecencio Mc, MD  Active   glucose blood (FREESTYLE LITE) test strip [id number] No Use as instructed to test blood sugar twice daily Crecencio Mc, MD Taking Active Self  hydrochlorothiazide (HYDRODIURIL) 12.5 MG tablet [id number] No Take 1 tablet (12.5 mg total) by mouth daily. Crecencio Mc, MD Taking Active Self  loratadine (CLARITIN) 10 MG tablet [id number] No Take 1 tablet (10 mg total) by mouth daily. Crecencio Mc, MD Taking Active    metFORMIN (GLUCOPHAGE XR) 750 MG 24 hr tablet [id number] No Take 2 tablets (1,500 mg total) by mouth daily with breakfast. Crecencio Mc, MD Taking Active   methotrexate (RHEUMATREX) 2.5 MG tablet [id number] No Take 3 tablets (7.5 mg total) by mouth once a week. Caution:Chemotherapy. Protect from light. Crecencio Mc, MD Taking Active   montelukast (SINGULAIR) 10 MG tablet [id number] No Take 1 tablet (10 mg total) by mouth at bedtime. Crecencio Mc, MD Taking Active Self  Multiple Vitamin (MULTIVITAMIN) capsule [id number] No Take 1 capsule by mouth daily. [provider] Taking Active Self  omeprazole (PRILOSEC) 20 MG capsule [id number] No Take 1 capsule (20 mg total) by mouth daily. Crecencio Mc, MD Taking Active   Precision Thin Lancets MISC [id number] No Use as directed to check sugars twice daily Crecencio Mc, MD Taking Active Self  rosuvastatin (CRESTOR) 10 MG tablet [id number] No Take 1 tablet (10 mg total) by mouth daily. Crecencio Mc, MD Taking Active   Semaglutide,0.25 or 0.5MG /DOS, (OZEMPIC, 0.25 OR 0.5 MG/DOSE,) 2 MG/1.5ML SOPN [id number]  Inject 0.25 mg weekly for 2 weeks then increase to 0.5 mg weekly Crecencio Mc, MD  Active   SUPREP BOWEL PREP KIT 17.5-3.13-1.6 GM/177ML SOLN [id number] No SMARTSIG:354 Milliliter(s) By Mouth Once  Patient not taking: Reported on 08/18/2020   [provider] Not Taking Active   telmisartan (MICARDIS) 80 MG tablet [id number] No Take 1 tablet (80 mg total) by mouth daily. Crecencio Mc, MD Taking Active            Med Note Mayo Ao Aug 24, 2020  4:58 PM)    traMADol (ULTRAM) 50 MG tablet [id number] No Take 1 tablet (50 mg total) by mouth every 6 (six) hours as needed (Breakthrough pain).  Patient not taking: No sig reported   Poggi, Marshall Cork, MD Not Taking Active   vitamin E 400 UNIT capsule [id number] No Take 400 Units by mouth daily. [provider] Taking Active Self            Patient Active  Problem List   Diagnosis Date Noted   Lumbar degenerative disc disease 07/31/2020   Leg pain, posterior, left 05/27/2020   S/P shoulder replacement, right 04/24/2020   Status post reverse total shoulder replacement, right 04/06/2020   Anastomotic stricture of colorectal region 03/10/2020   Hemorrhage of colon due to diverticulosis 03/10/2020   Rotator cuff arthropathy, right 11/25/2019   Travel advice encounter 10/15/2019   Fall at home, initial encounter 10/14/2019   Atherosclerosis of aorta ([place]) 10/14/2019   Vaginal atrophy 08/20/2019   Dyspareunia in female 08/20/2019   Perineal rash in female 04/12/2019   Hearing loss 04/12/2019   Breast calcifications on mammogram 09/13/2018   Family history of Alzheimer's disease 08/13/2018   Macrocytosis without anemia 04/14/2018   Hx of colonic polyps    S/P partial colectomy 08/05/2017   Tubular adenoma of colon 08/03/2017   Overweight (BMI 25.0-29.9) 05/04/2016   Basal cell carcinoma of right forehead 08/28/2015   Patent foramen ovale with right to left shunt 08/28/2015   Postmenopausal estrogen deficiency 05/07/2015   DM type 2 with diabetic mixed hyperlipidemia ([place]) 02/08/2015   Uncontrolled type 2 diabetes mellitus ([place]) 02/08/2015   Allergic rhinitis 01/28/2013   Basal cell carcinoma of left side of nose 04/28/2012   Long-term use of high-risk medication 10/21/2011   Routine general medical examination at a health care facility 07/18/2011   Guttate psoriasis 04/04/2011   Constipation 01/19/2011   Reflux esophagitis 01/19/2011   Diverticulosis of sigmoid colon 11/10/2010   White coat syndrome with diagnosis of hypertension 09/07/2010   Hyperlipidemia associated with type 2 diabetes mellitus ([place]) 09/07/2010   Hyperlipidemia 09/07/2010    Immunization History  Administered Date(s) Administered   Fluad Quad(high Dose 65+) 10/03/2018   Influenza Split 10/19/2011   Influenza, High Dose Seasonal PF 10/01/2014, 10/06/2016,  09/06/2017   Influenza,inj,Quad PF,6+ Mos 10/31/2012, 09/26/2013, 08/26/2015   Influenza-Unspecified 10/07/2019   PFIZER(Purple Top)SARS-COV-2 Vaccination 02/12/2019, 03/05/2019, 10/09/2019, 04/08/2020   Pneumococcal Conjugate-13 01/28/2013   Pneumococcal Polysaccharide-23 11/02/2010, 05/04/2016   Tdap 11/16/2010   Zoster Recombinat (Shingrix) 06/25/2017, 09/06/2017   Zoster, Live 10/19/2007    Conditions to be addressed/monitored: HTN, HLD, and DMII  Care Plan : Medication Management  Updates made by [name], RPH-CPP since  09/24/2020 12:00 AM     Problem: Diabetes, HTN, HLD, psoriasis      Long-Range Goal: Disease Progression Prevention   Start Date: 08/18/2020  This Visit's Progress: On track  Recent Progress: On track  Priority: High  Note:   Current Barriers:  Unable to achieve control of diabetes   Pharmacist Clinical Goal(s):  Over the next 90 days, patient will achieve control of diabetes as evidenced by A1c  through collaboration with PharmD and provider.  Interventions: 1:1 collaboration with Crecencio Mc, MD regarding development and update of comprehensive plan of care as evidenced by provider attestation and co-signature Inter-disciplinary care team collaboration (see longitudinal plan of care) Comprehensive medication review performed; medication list updated in electronic medical record  SDOH: [employer]. No medication cost concerns.   Health Maintenance: Due for DM eye exam. Will discuss moving forward.   Diabetes: Uncontrolled; current treatment: metformin XR 1500 mg daily, Ozempic 0.25 mg weekly; glipizide 5 mg daily;  Current home glucose readings: fastings 135-140s; 2 hour post prandial: 180-190s. Reports feeling better on lower dose of glipizide.  Faxed appeal with patient signature back into [employer] on 09/02/20. Contacted [employer] to follow up on status of appeal (appeal phone is [phone]). They  now note that with [employer] plans, it can take up to 45 days for the appeal to be processed. Will follow for results.   Hypertension: Uncontrolled at last clinic; current treatment: amlodipine 2.5 mg daily, telmisartan 80 mg daily, HCTZ 12.5 mg daily Previously recommended to continue current regimen  Hyperlipidemia, Aortic Atherosclerosis: Could consider tighter control given DM + aortic atherosclerosis, as well as risk factor of inflammatory conditions; current treatment: rosuvastatin 10 mg daily Current antiplatelet regimen: aspirin 81 mg daily Previously recommended to continue current regimen  Depression: Controlled per patient report; current regimen: escitalopram 5 mg daily Previously recommended to continue current regimen at this time  Psoriasis: Controlled per patient report; current regimen: methotrexate 7.5 mg once weekly, folic acid 1 mg daily Previously recommended to continue current regimen at this time along with collaboration with dermatology  Allergies: Controlled per patient report; current regimen: loratadine 10 mg daily, montelukast 10 mg daily Previously recommended to continue current regimen at this time  GERD: Controlled per patient report; current regimen: omeprazole 20 mg daily Previously recommended to continue current regimen at this time. Evaluate ability to reduce to PRN PPI or H2RA therapy moving forward.   Atrophic Vaginitis: Controlled per patient report; current regimen: premarin vaginal cream PRN Previously recommended to continue current regimen at this time.  Supplements: Vitamin D, Vitamin C, Calcium, MVI  Patient Goals/Self-Care Activities Over the next 90 days, patient will:  - take medications as prescribed check glucose twice daily, document, and provide at future appointments check blood pressure perioidically, document, and provide at future appointments  Follow Up Plan: Face to Face appointment with care management  team member scheduled for:  ~10 weeks w/ PCP visit     Medication Assistance: None required.  Patient affirms current coverage meets needs.  Patient's preferred pharmacy is:  [id number] [address], [place] [address] Phone: [phone] Fax: [phone]  [facility] [place], [employer] [place] [address] [place] [address] Phone: [phone] Fax: [phone]  Follow Up:  Patient agrees to Care Plan and Follow-up.  Plan: Telephone follow up appointment with care management team member scheduled for:  ~ 8 weeks as previously scheduled  [name], PharmD, [place], [place] Clinical Pharmacist [employer] [facility] [employer] [phone]

## 2020-09-24 NOTE — Patient Instructions (Signed)
Visit Information  PATIENT GOALS:  Goals Addressed               This Visit's Progress     Patient Stated     Medication Monitoring (pt-stated)        Patient Goals/Self-Care Activities Over the next 90 days, patient will:  - take medications as prescribed check glucose twice daily, document, and provide at future appointments check blood pressure perioidically, document, and provide at future appointments        Patient verbalizes understanding of instructions provided today and agrees to view in [place].    Plan: Telephone follow up appointment with care management team member scheduled for:  ~ 8 weeks as previously scheduled  [name], PharmD, [place], [place] Clinical Pharmacist [employer] [facility] [employer] [phone]

## 2020-10-02 DIAGNOSIS — E782 Mixed hyperlipidemia: Secondary | ICD-10-CM | POA: Diagnosis not present

## 2020-10-02 DIAGNOSIS — E785 Hyperlipidemia, unspecified: Secondary | ICD-10-CM

## 2020-10-02 DIAGNOSIS — E1169 Type 2 diabetes mellitus with other specified complication: Secondary | ICD-10-CM

## 2020-10-05 ENCOUNTER — Ambulatory Visit
Admission: RE | Admit: 2020-10-05 | Discharge: 2020-10-05 | Disposition: A | Discharge status: Home / Self Care | Payer: Medicare Other | Source: Ambulatory Visit | Attending: Internal Medicine | Admitting: Internal Medicine

## 2020-10-05 ENCOUNTER — Other Ambulatory Visit: Payer: Self-pay

## 2020-10-05 DIAGNOSIS — Z1231 Encounter for screening mammogram for malignant neoplasm of breast: Secondary | ICD-10-CM

## 2020-10-06 ENCOUNTER — Telehealth: Payer: Self-pay | Admitting: Pharmacist

## 2020-10-06 NOTE — Telephone Encounter (Signed)
**Note Emily-Identified via Obfuscation** Medication Samples have been provided to the patient.  Drug name: Ozempic       Strength: 2 mg/1.5 mL        Qty: 1 pen  LOT: [id number]  Exp.Date: 02/03/2023  Dosing instructions: Inject 0.5 mg weekly  The patient has been instructed regarding the correct time, dose, and frequency of taking this medication, including desired effects and most common side effects.   Emily Ryan 1:24 PM 10/06/2020

## 2020-10-07 NOTE — Telephone Encounter (Signed)
Patient picked up sample 

## 2020-10-08 ENCOUNTER — Ambulatory Visit (INDEPENDENT_AMBULATORY_CARE_PROVIDER_SITE_OTHER): Payer: Medicare Other | Admitting: Pharmacist

## 2020-10-08 DIAGNOSIS — I700 Atherosclerosis of aorta: Secondary | ICD-10-CM

## 2020-10-08 DIAGNOSIS — E1169 Type 2 diabetes mellitus with other specified complication: Secondary | ICD-10-CM

## 2020-10-08 MED ORDER — OZEMPIC (0.25 OR 0.5 MG/DOSE) 2 MG/1.5ML ~~LOC~~ SOPN: 0.5000 mg | PEN_INJECTOR | SUBCUTANEOUS | 3 refills | Status: DC

## 2020-10-08 NOTE — Chronic Care Management (AMB) (Signed)
Chronic Care Management Pharmacy Note  10/08/2020 Name:  Emily Ryan MRN:  [id number] DOB:  [birth year]   Subjective: Emily Ryan is an 75 y.o. year old female who is a primary patient of Derrel Nip, Aris Everts, MD.  The CCM team was consulted for assistance with disease management and care coordination needs.    Engaged with patient by telephone for  follow up  in response to provider referral for pharmacy case management and/or care coordination services.   Consent to Services:  The patient was given information about Chronic Care Management services, agreed to services, and gave verbal consent prior to initiation of services.  Please see initial visit note for detailed documentation.   Patient Care Team: Crecencio Mc, MD as PCP - General (Internal Medicine) Minna Merritts, MD as Consulting Physician (Cardiology) Lin Landsman, MD as Consulting Physician (Gastroenterology) Michael Boston, MD as Consulting Physician (General Surgery) [name], RPH-CPP (Pharmacist)   Objective:  Lab Results  Component Value Date   CREATININE 0.61 07/29/2020   CREATININE 0.62 05/27/2020   CREATININE 0.63 04/16/2020    Lab Results  Component Value Date   HGBA1C 8.0 (H) 07/29/2020   Last diabetic Eye exam:  Lab Results  Component Value Date/Time   HMDIABEYEEXA No Retinopathy 06/12/2018 12:00 AM    Last diabetic Foot exam:  Lab Results  Component Value Date/Time   HMDIABFOOTEX normal 01/27/2014 12:00 AM        Component Value Date/Time   CHOL 161 07/29/2020 0813   CHOL 155 01/08/2019 1122   TRIG 168.0 (H) 07/29/2020 0813   TRIG 218 (H) 01/08/2019 1122   HDL 47.20 07/29/2020 0813   CHOLHDL 3 07/29/2020 0813   VLDL 33.6 07/29/2020 0813   LDLCALC 80 07/29/2020 0813   LDLDIRECT 72.0 04/12/2018 0825    Hepatic Function Latest Ref Rng & Units 07/29/2020 05/27/2020 04/16/2020  Total Protein 6.0 - 8.3 g/dL 6.8 6.7 6.8  Albumin 3.5 - 5.2 g/dL 4.0 4.2 3.9  AST  0 - 37 U/L _0 ALT 0 - 35 U/L 36(H) 32 32  Alk Phosphatase 39 - 117 U/L 86 94 91  Total Bilirubin 0.2 - 1.2 mg/dL 0.5 0.6 0.7  Bilirubin, Direct 0.0 - 0.3 mg/dL - - -    Lab Results  Component Value Date/Time   TSH 3.11 01/14/2020 08:02 AM   TSH 1.62 02/04/2015 01:37 PM    CBC Latest Ref Rng & Units 07/29/2020 05/27/2020 02/19/2020  WBC 4.0 - 10.5 K/uL 6.0 6.2 5.7  Hemoglobin 12.0 - 15.0 g/dL 13.6 14.5 14.5  Hematocrit 36.0 - 46.0 % 40.2 42.3 42.2  Platelets 150.0 - 400.0 K/uL 290.0 263.0 239    Lab Results  Component Value Date/Time   VD25OH 35.87 07/29/2020 08:13 AM   VD25OH 34.28 11/29/2017 09:15 AM      Social History   Tobacco Use  Smoking Status Never  Smokeless Tobacco Never  Tobacco Comments   father growing up   BP Readings from Last 3 Encounters:  07/30/20 (!) 142/68  06/12/20 (!) 150/70  06/12/20 (!) 163/81   Pulse Readings from Last 3 Encounters:  07/30/20 83  06/12/20 79  06/12/20 73   Wt Readings from Last 3 Encounters:  08/26/20 132 lb (59.9 kg)  07/30/20 132 lb 6.4 oz (60.1 kg)  06/12/20 132 lb (59.9 kg)    Assessment: Review of patient past medical history, allergies, medications, health status, including review of consultants reports, laboratory  and other test data, was performed as part of comprehensive evaluation and provision of chronic care management services.   SDOH:  (Social Determinants of Health) assessments and interventions performed:  SDOH Interventions    Flowsheet Row Most Recent Value  SDOH Interventions   Financial Strain Interventions Intervention Not Indicated       CCM Care Plan  Allergies  Allergen Reactions   Prednisone Hives    Medications Reviewed Today     Reviewed by Dia Crawford, LPN (Licensed Practical Nurse) on 08/26/20 at 1316  Med List Status: <None>   Medication Order Taking? Sig Documenting Provider Last Dose Status Informant  amLODipine (NORVASC) 2.5 MG tablet [id number] No  Take 1 tablet (2.5 mg total) by mouth every evening. Crecencio Mc, MD Taking Active   Ascorbic Acid (VITAMIN C) 1000 MG tablet [id number] No Take 1,000 mg by mouth daily.  [provider] Taking Active Self  aspirin EC 81 MG tablet [id number] No Take 81 mg by mouth at bedtime. Swallow whole. [provider] Taking Active Self  Calcium Carbonate-Vitamin D3 600-400 MG-UNIT TABS [id number] No Take 1 tablet by mouth daily. [provider] Taking Active Self  clobetasol cream (TEMOVATE) 0.05 % [id number] No Apply 1 application topically 2 (two) times daily as needed (psoriasis). [provider] Taking Active Self  conjugated estrogens (PREMARIN) vaginal cream [id number] No Place 1 Applicatorful vaginally daily.  Patient taking differently: Place 1 Applicatorful vaginally daily as needed (dryness/irritation).   Rubie Maid, MD Taking Active   cyclobenzaprine (FLEXERIL) 5 MG tablet [id number] No Take 1 tablet (5 mg total) by mouth 3 (three) times daily as needed.  Patient not taking: Reported on 08/18/2020   Crecencio Mc, MD Not Taking Active   escitalopram (LEXAPRO) 5 MG tablet [id number] No Take 1 tablet (5 mg total) by mouth every evening. Crecencio Mc, MD Taking Active   folic acid (FOLVITE) 1 MG tablet [id number] No Take 1 tablet (1 mg total) by mouth See admin instructions. Takes daily except on Saturday Crecencio Mc, MD Taking Active   glipiZIDE (GLUCOTROL) 5 MG tablet [id number]  Take 1 tablet (5 mg total) by mouth daily before breakfast. Crecencio Mc, MD  Active   glucose blood (FREESTYLE LITE) test strip [id number] No Use as instructed to test blood sugar twice daily Crecencio Mc, MD Taking Active Self  hydrochlorothiazide (HYDRODIURIL) 12.5 MG tablet [id number] No Take 1 tablet (12.5 mg total) by mouth daily. Crecencio Mc, MD Taking Active Self  loratadine (CLARITIN) 10 MG tablet [id number] No Take 1 tablet (10 mg total) by mouth daily. Crecencio Mc, MD Taking Active   metFORMIN (GLUCOPHAGE XR) 750 MG 24 hr tablet [id number] No Take 2 tablets (1,500 mg total) by mouth daily with breakfast. Crecencio Mc, MD Taking Active   methotrexate (RHEUMATREX) 2.5 MG tablet [id number] No Take 3 tablets (7.5 mg total) by mouth once a week. Caution:Chemotherapy. Protect from light. Crecencio Mc, MD Taking Active   montelukast (SINGULAIR) 10 MG tablet [id number] No Take 1 tablet (10 mg total) by mouth at bedtime. Crecencio Mc, MD Taking Active Self  Multiple Vitamin (MULTIVITAMIN) capsule [id number] No Take 1 capsule by mouth daily. [provider] Taking Active Self  omeprazole (PRILOSEC) 20 MG capsule [id number] No Take 1 capsule (20 mg total) by mouth daily. Crecencio Mc, MD Taking Active   Precision Thin Lancets MISC [id number] No Use as directed to check sugars twice daily Tullo,  Aris Everts, MD Taking Active Self  rosuvastatin (CRESTOR) 10 MG tablet [id number] No Take 1 tablet (10 mg total) by mouth daily. Crecencio Mc, MD Taking Active   Semaglutide,0.25 or 0.5MG/DOS, (OZEMPIC, 0.25 OR 0.5 MG/DOSE,) 2 MG/1.5ML SOPN [id number]  Inject 0.25 mg weekly for 2 weeks then increase to 0.5 mg weekly Crecencio Mc, MD  Active   SUPREP BOWEL PREP KIT 17.5-3.13-1.6 GM/177ML SOLN [id number] No SMARTSIG:354 Milliliter(s) By Mouth Once  Patient not taking: Reported on 08/18/2020   [provider] Not Taking Active   telmisartan (MICARDIS) 80 MG tablet [id number] No Take 1 tablet (80 mg total) by mouth daily. Crecencio Mc, MD Taking Active            Med Note Mayo Ao Aug 24, 2020  4:58 PM)    traMADol (ULTRAM) 50 MG tablet [id number] No Take 1 tablet (50 mg total) by mouth every 6 (six) hours as needed (Breakthrough pain).  Patient not taking: No sig reported   Poggi, Marshall Cork, MD Not Taking Active   vitamin E 400 UNIT capsule [id number] No Take 400 Units by mouth daily. [provider] Taking Active Self             Patient Active Problem List   Diagnosis Date Noted   Lumbar degenerative disc disease 07/31/2020   Leg pain, posterior, left 05/27/2020   S/P shoulder replacement, right 04/24/2020   Status post reverse total shoulder replacement, right 04/06/2020   Anastomotic stricture of colorectal region 03/10/2020   Hemorrhage of colon due to diverticulosis 03/10/2020   Rotator cuff arthropathy, right 11/25/2019   Travel advice encounter 10/15/2019   Fall at home, initial encounter 10/14/2019   Atherosclerosis of aorta ([place]) 10/14/2019   Vaginal atrophy 08/20/2019   Dyspareunia in female 08/20/2019   Perineal rash in female 04/12/2019   Hearing loss 04/12/2019   Breast calcifications on mammogram 09/13/2018   Family history of Alzheimer's disease 08/13/2018   Macrocytosis without anemia 04/14/2018   Hx of colonic polyps    S/P partial colectomy 08/05/2017   Tubular adenoma of colon 08/03/2017   Overweight (BMI 25.0-29.9) 05/04/2016   Basal cell carcinoma of right forehead 08/28/2015   Patent foramen ovale with right to left shunt 08/28/2015   Postmenopausal estrogen deficiency 05/07/2015   DM type 2 with diabetic mixed hyperlipidemia ([place]) 02/08/2015   Uncontrolled type 2 diabetes mellitus 02/08/2015   Allergic rhinitis 01/28/2013   Basal cell carcinoma of left side of nose 04/28/2012   Long-term use of high-risk medication 10/21/2011   Routine general medical examination at a health care facility 07/18/2011   Guttate psoriasis 04/04/2011   Constipation 01/19/2011   Reflux esophagitis 01/19/2011   Diverticulosis of sigmoid colon 11/10/2010   White coat syndrome with diagnosis of hypertension 09/07/2010   Hyperlipidemia associated with type 2 diabetes mellitus ([place]) 09/07/2010   Hyperlipidemia 09/07/2010    Immunization History  Administered Date(s) Administered   Fluad Quad(high Dose 65+) 10/03/2018   Influenza Split 10/19/2011   Influenza, High Dose Seasonal PF 10/01/2014,  10/06/2016, 09/06/2017   Influenza,inj,Quad PF,6+ Mos 10/31/2012, 09/26/2013, 08/26/2015   Influenza-Unspecified 10/07/2019   PFIZER(Purple Top)SARS-COV-2 Vaccination 02/12/2019, 03/05/2019, 10/09/2019, 04/08/2020   Pneumococcal Conjugate-13 01/28/2013   Pneumococcal Polysaccharide-23 11/02/2010, 05/04/2016   Tdap 11/16/2010   Zoster Recombinat (Shingrix) 06/25/2017, 09/06/2017   Zoster, Live 10/19/2007    Conditions to be addressed/monitored: HTN, HLD, and DMII  Care Plan : Medication Management  Updates made  by [name], RPH-CPP since 10/08/2020 12:00 AM     Problem: Diabetes, HTN, HLD, psoriasis      Long-Range Goal: Disease Progression Prevention   Start Date: 08/18/2020  Recent Progress: On track  Priority: High  Note:   Current Barriers:  Unable to achieve control of diabetes   Pharmacist Clinical Goal(s):  Over the next 90 days, patient will achieve control of diabetes as evidenced by A1c  through collaboration with PharmD and provider.  Interventions: 1:1 collaboration with Crecencio Mc, MD regarding development and update of comprehensive plan of care as evidenced by provider attestation and co-signature Inter-disciplinary care team collaboration (see longitudinal plan of care) Comprehensive medication review performed; medication list updated in electronic medical record  SDOH: [employer] for Life. No medication cost concerns.   Diabetes: Uncontrolled; current treatment: metformin XR 1500 mg daily, Ozempic 0.5 mg weekly; glipizide 5 mg daily;  Contacted [employer]. Patient has been APPROVED for [place] assistance for lifetime.   Hypertension: Uncontrolled at last clinic; current treatment: amlodipine 2.5 mg daily, telmisartan 80 mg daily, HCTZ 12.5 mg daily Previously recommended to continue current regimen  Hyperlipidemia, Aortic Atherosclerosis: Could consider tighter control given DM + aortic atherosclerosis, as well as risk factor  of inflammatory conditions; current treatment: rosuvastatin 10 mg daily Current antiplatelet regimen: aspirin 81 mg daily Previously recommended to continue current regimen  Depression: Controlled per patient report; current regimen: escitalopram 5 mg daily Previously recommended to continue current regimen at this time  Psoriasis: Controlled per patient report; current regimen: methotrexate 7.5 mg once weekly, folic acid 1 mg daily Previously recommended to continue current regimen at this time along with collaboration with dermatology  Allergies: Controlled per patient report; current regimen: loratadine 10 mg daily, montelukast 10 mg daily Previously recommended to continue current regimen at this time  GERD: Controlled per patient report; current regimen: omeprazole 20 mg daily Previously recommended to continue current regimen at this time. Evaluate ability to reduce to PRN PPI or H2RA therapy moving forward.   Atrophic Vaginitis: Controlled per patient report; current regimen: premarin vaginal cream PRN Previously recommended to continue current regimen at this time.  Supplements: Vitamin D, Vitamin C, Calcium, MVI  Patient Goals/Self-Care Activities Over the next 90 days, patient will:  - take medications as prescribed check glucose twice daily, document, and provide at future appointments check blood pressure perioidically, document, and provide at future appointments  Follow Up Plan: Face to Face appointment with care management team member scheduled for:  ~10 weeks w/ PCP visit     Medication Assistance: None required.  Patient affirms current coverage meets needs.  Patient's preferred pharmacy is:  [id number] [address], [place] [address] Phone: [phone] Fax: [phone]  [facility] [place], [place] [address] [place] [address] Phone: [phone] Fax: [phone]  [facility] [place], [place] [address] [place] [address] Phone: [phone] Fax: [phone]    Follow Up:  Patient agrees to Care Plan and Follow-up.  Plan: Telephone follow up appointment with care management team member scheduled for:  ~ 6 weeks as previously scheduled  [name], PharmD, [place], [place] Clinical Pharmacist [employer] [facility] [employer] [phone]

## 2020-10-08 NOTE — Patient Instructions (Signed)
Visit Information  PATIENT GOALS:  Goals Addressed               This Visit's Progress     Patient Stated     Medication Monitoring (pt-stated)        Patient Goals/Self-Care Activities Over the next 90 days, patient will:  - take medications as prescribed check glucose twice daily, document, and provide at future appointments check blood pressure perioidically, document, and provide at future appointments         Patient verbalizes understanding of instructions provided today and agrees to view in [place].    Plan: Telephone follow up appointment with care management team member scheduled for:  ~ 6 weeks as previously scheduled  [name], PharmD, [place], [place] Clinical Pharmacist [employer] [facility] [employer] [phone]

## 2020-10-19 ENCOUNTER — Ambulatory Visit: Payer: Medicare Other | Admitting: Pharmacist

## 2020-10-19 DIAGNOSIS — E782 Mixed hyperlipidemia: Secondary | ICD-10-CM

## 2020-10-19 DIAGNOSIS — E1169 Type 2 diabetes mellitus with other specified complication: Secondary | ICD-10-CM

## 2020-10-19 MED ORDER — OZEMPIC (0.25 OR 0.5 MG/DOSE) 2 MG/1.5ML ~~LOC~~ SOPN: 0.5000 mg | PEN_INJECTOR | SUBCUTANEOUS | 3 refills | Status: DC

## 2020-10-19 NOTE — Patient Instructions (Signed)
Visit Information  PATIENT GOALS:  Goals Addressed               This Visit's Progress     Patient Stated     Medication Monitoring (pt-stated)        Patient Goals/Self-Care Activities Over the next 90 days, patient will:  - take medications as prescribed check glucose twice daily, document, and provide at future appointments check blood pressure perioidically, document, and provide at future appointments        Patient verbalizes understanding of instructions provided today and agrees to view in [place].   Plan: Face to Face appointment with care management team member scheduled for: 3 weeks  [name], PharmD, [place], [place] Clinical Pharmacist [employer] [facility] [employer] [phone]

## 2020-10-19 NOTE — Chronic Care Management (AMB) (Signed)
Chronic Care Management Pharmacy Note  10/19/2020 Name:  Emily Ryan MRN:  [id number] DOB:  04-23-1945   Subjective: Emily Ryan is an 75 y.o. year old female who is a primary patient of Derrel Nip, Aris Everts, MD.  The CCM team was consulted for assistance with disease management and care coordination needs.    Engaged with patient by telephone for follow up visit in response to provider referral for pharmacy case management and/or care coordination services.   Consent to Services:  The patient was given information about Chronic Care Management services, agreed to services, and gave verbal consent prior to initiation of services.  Please see initial visit note for detailed documentation.   Patient Care Team: Crecencio Mc, MD as PCP - General (Internal Medicine) Minna Merritts, MD as Consulting Physician (Cardiology) Lin Landsman, MD as Consulting Physician (Gastroenterology) Michael Boston, MD as Consulting Physician (General Surgery) [name], RPH-CPP (Pharmacist)   Objective:  Lab Results  Component Value Date   CREATININE 0.61 07/29/2020   CREATININE 0.62 05/27/2020   CREATININE 0.63 04/16/2020    Lab Results  Component Value Date   HGBA1C 8.0 (H) 07/29/2020   Last diabetic Eye exam:  Lab Results  Component Value Date/Time   HMDIABEYEEXA No Retinopathy 06/12/2018 12:00 AM    Last diabetic Foot exam:  Lab Results  Component Value Date/Time   HMDIABFOOTEX normal 01/27/2014 12:00 AM        Component Value Date/Time   CHOL 161 07/29/2020 0813   CHOL 155 01/08/2019 1122   TRIG 168.0 (H) 07/29/2020 0813   TRIG 218 (H) 01/08/2019 1122   HDL 47.20 07/29/2020 0813   CHOLHDL 3 07/29/2020 0813   VLDL 33.6 07/29/2020 0813   LDLCALC 80 07/29/2020 0813   LDLDIRECT 72.0 04/12/2018 0825    Hepatic Function Latest Ref Rng & Units 07/29/2020 05/27/2020 04/16/2020  Total Protein 6.0 - 8.3 g/dL 6.8 6.7 6.8  Albumin 3.5 - 5.2 g/dL 4.0 4.2 3.9   AST 0 - 37 U/L _0 ALT 0 - 35 U/L 36(H) 32 32  Alk Phosphatase 39 - 117 U/L 86 94 91  Total Bilirubin 0.2 - 1.2 mg/dL 0.5 0.6 0.7  Bilirubin, Direct 0.0 - 0.3 mg/dL - - -    Lab Results  Component Value Date/Time   TSH 3.11 01/14/2020 08:02 AM   TSH 1.62 02/04/2015 01:37 PM    CBC Latest Ref Rng & Units 07/29/2020 05/27/2020 02/19/2020  WBC 4.0 - 10.5 K/uL 6.0 6.2 5.7  Hemoglobin 12.0 - 15.0 g/dL 13.6 14.5 14.5  Hematocrit 36.0 - 46.0 % 40.2 42.3 42.2  Platelets 150.0 - 400.0 K/uL 290.0 263.0 239    Lab Results  Component Value Date/Time   VD25OH 35.87 07/29/2020 08:13 AM   VD25OH 34.28 11/29/2017 09:15 AM    Social History   Tobacco Use  Smoking Status Never  Smokeless Tobacco Never  Tobacco Comments   father growing up   BP Readings from Last 3 Encounters:  07/30/20 (!) 142/68  06/12/20 (!) 150/70  06/12/20 (!) 163/81   Pulse Readings from Last 3 Encounters:  07/30/20 83  06/12/20 79  06/12/20 73   Wt Readings from Last 3 Encounters:  08/26/20 132 lb (59.9 kg)  07/30/20 132 lb 6.4 oz (60.1 kg)  06/12/20 132 lb (59.9 kg)    Assessment: Review of patient past medical history, allergies, medications, health status, including review of consultants reports, laboratory and other test  data, was performed as part of comprehensive evaluation and provision of chronic care management services.   SDOH:  (Social Determinants of Health) assessments and interventions performed:  SDOH Interventions    Flowsheet Row Most Recent Value  SDOH Interventions   Financial Strain Interventions Intervention Not Indicated       CCM Care Plan  Allergies  Allergen Reactions   Prednisone Hives    Medications Reviewed Today     Reviewed by Dia Crawford, LPN (Licensed Practical Nurse) on 08/26/20 at 1316  Med List Status: <None>   Medication Order Taking? Sig Documenting Provider Last Dose Status Informant  amLODipine (NORVASC) 2.5 MG tablet [id number] No  Take 1 tablet (2.5 mg total) by mouth every evening. Crecencio Mc, MD Taking Active   Ascorbic Acid (VITAMIN C) 1000 MG tablet [id number] No Take 1,000 mg by mouth daily.  [provider] Taking Active Self  aspirin EC 81 MG tablet [id number] No Take 81 mg by mouth at bedtime. Swallow whole. [provider] Taking Active Self  Calcium Carbonate-Vitamin D3 600-400 MG-UNIT TABS [id number] No Take 1 tablet by mouth daily. [provider] Taking Active Self  clobetasol cream (TEMOVATE) 0.05 % [id number] No Apply 1 application topically 2 (two) times daily as needed (psoriasis). [provider] Taking Active Self  conjugated estrogens (PREMARIN) vaginal cream [id number] No Place 1 Applicatorful vaginally daily.  Patient taking differently: Place 1 Applicatorful vaginally daily as needed (dryness/irritation).   Rubie Maid, MD Taking Active   cyclobenzaprine (FLEXERIL) 5 MG tablet [id number] No Take 1 tablet (5 mg total) by mouth 3 (three) times daily as needed.  Patient not taking: Reported on 08/18/2020   Crecencio Mc, MD Not Taking Active   escitalopram (LEXAPRO) 5 MG tablet [id number] No Take 1 tablet (5 mg total) by mouth every evening. Crecencio Mc, MD Taking Active   folic acid (FOLVITE) 1 MG tablet [id number] No Take 1 tablet (1 mg total) by mouth See admin instructions. Takes daily except on Saturday Crecencio Mc, MD Taking Active   glipiZIDE (GLUCOTROL) 5 MG tablet [id number]  Take 1 tablet (5 mg total) by mouth daily before breakfast. Crecencio Mc, MD  Active   glucose blood (FREESTYLE LITE) test strip [id number] No Use as instructed to test blood sugar twice daily Crecencio Mc, MD Taking Active Self  hydrochlorothiazide (HYDRODIURIL) 12.5 MG tablet [id number] No Take 1 tablet (12.5 mg total) by mouth daily. Crecencio Mc, MD Taking Active Self  loratadine (CLARITIN) 10 MG tablet [id number] No Take 1 tablet (10 mg total) by mouth daily. Crecencio Mc, MD Taking Active   metFORMIN (GLUCOPHAGE XR) 750 MG 24 hr tablet [id number] No Take 2 tablets (1,500 mg total) by mouth daily with breakfast. Crecencio Mc, MD Taking Active   methotrexate (RHEUMATREX) 2.5 MG tablet [id number] No Take 3 tablets (7.5 mg total) by mouth once a week. Caution:Chemotherapy. Protect from light. Crecencio Mc, MD Taking Active   montelukast (SINGULAIR) 10 MG tablet [id number] No Take 1 tablet (10 mg total) by mouth at bedtime. Crecencio Mc, MD Taking Active Self  Multiple Vitamin (MULTIVITAMIN) capsule [id number] No Take 1 capsule by mouth daily. [provider] Taking Active Self  omeprazole (PRILOSEC) 20 MG capsule [id number] No Take 1 capsule (20 mg total) by mouth daily. Crecencio Mc, MD Taking Active   Precision Thin Lancets MISC [id number] No Use as directed to check sugars twice daily Crecencio Mc, MD  Taking Active Self  rosuvastatin (CRESTOR) 10 MG tablet [id number] No Take 1 tablet (10 mg total) by mouth daily. Crecencio Mc, MD Taking Active   Semaglutide,0.25 or 0.5MG/DOS, (OZEMPIC, 0.25 OR 0.5 MG/DOSE,) 2 MG/1.5ML SOPN [id number]  Inject 0.25 mg weekly for 2 weeks then increase to 0.5 mg weekly Crecencio Mc, MD  Active   SUPREP BOWEL PREP KIT 17.5-3.13-1.6 GM/177ML SOLN [id number] No SMARTSIG:354 Milliliter(s) By Mouth Once  Patient not taking: Reported on 08/18/2020   [provider] Not Taking Active   telmisartan (MICARDIS) 80 MG tablet [id number] No Take 1 tablet (80 mg total) by mouth daily. Crecencio Mc, MD Taking Active            Med Note Mayo Ao Aug 24, 2020  4:58 PM)    traMADol (ULTRAM) 50 MG tablet [id number] No Take 1 tablet (50 mg total) by mouth every 6 (six) hours as needed (Breakthrough pain).  Patient not taking: No sig reported   Poggi, Marshall Cork, MD Not Taking Active   vitamin E 400 UNIT capsule [id number] No Take 400 Units by mouth daily. [provider] Taking Active Self             Patient Active Problem List   Diagnosis Date Noted   Lumbar degenerative disc disease 07/31/2020   Leg pain, posterior, left 05/27/2020   S/P shoulder replacement, right 04/24/2020   Status post reverse total shoulder replacement, right 04/06/2020   Anastomotic stricture of colorectal region 03/10/2020   Hemorrhage of colon due to diverticulosis 03/10/2020   Rotator cuff arthropathy, right 11/25/2019   Travel advice encounter 10/15/2019   Fall at home, initial encounter 10/14/2019   Atherosclerosis of aorta ([place]) 10/14/2019   Vaginal atrophy 08/20/2019   Dyspareunia in female 08/20/2019   Perineal rash in female 04/12/2019   Hearing loss 04/12/2019   Breast calcifications on mammogram 09/13/2018   Family history of Alzheimer's disease 08/13/2018   Macrocytosis without anemia 04/14/2018   Hx of colonic polyps    S/P partial colectomy 08/05/2017   Tubular adenoma of colon 08/03/2017   Overweight (BMI 25.0-29.9) 05/04/2016   Basal cell carcinoma of right forehead 08/28/2015   Patent foramen ovale with right to left shunt 08/28/2015   Postmenopausal estrogen deficiency 05/07/2015   DM type 2 with diabetic mixed hyperlipidemia ([place]) 02/08/2015   Uncontrolled type 2 diabetes mellitus 02/08/2015   Allergic rhinitis 01/28/2013   Basal cell carcinoma of left side of nose 04/28/2012   Long-term use of high-risk medication 10/21/2011   Routine general medical examination at a health care facility 07/18/2011   Guttate psoriasis 04/04/2011   Constipation 01/19/2011   Reflux esophagitis 01/19/2011   Diverticulosis of sigmoid colon 11/10/2010   White coat syndrome with diagnosis of hypertension 09/07/2010   Hyperlipidemia associated with type 2 diabetes mellitus ([place]) 09/07/2010   Hyperlipidemia 09/07/2010    Immunization History  Administered Date(s) Administered   Fluad Quad(high Dose 65+) 10/03/2018   Influenza Split 10/19/2011   Influenza, High Dose Seasonal PF 10/01/2014,  10/06/2016, 09/06/2017   Influenza,inj,Quad PF,6+ Mos 10/31/2012, 09/26/2013, 08/26/2015   Influenza-Unspecified 10/07/2019   PFIZER(Purple Top)SARS-COV-2 Vaccination 02/12/2019, 03/05/2019, 10/09/2019, 04/08/2020   Pneumococcal Conjugate-13 01/28/2013   Pneumococcal Polysaccharide-23 11/02/2010, 05/04/2016   Tdap 11/16/2010   Zoster Recombinat (Shingrix) 06/25/2017, 09/06/2017   Zoster, Live 10/19/2007    Conditions to be addressed/monitored: HTN, HLD, and DMII  Care Plan : Medication Management  Updates made by Alphonzo Severance  E, RPH-CPP since 10/19/2020 12:00 AM     Problem: Diabetes, HTN, HLD, psoriasis      Long-Range Goal: Disease Progression Prevention   Start Date: 08/18/2020  Recent Progress: On track  Priority: High  Note:   Current Barriers:  Unable to achieve control of diabetes   Pharmacist Clinical Goal(s):  Over the next 90 days, patient will achieve control of diabetes as evidenced by A1c  through collaboration with PharmD and provider.  Interventions: 1:1 collaboration with Crecencio Mc, MD regarding development and update of comprehensive plan of care as evidenced by provider attestation and co-signature Inter-disciplinary care team collaboration (see longitudinal plan of care) Comprehensive medication review performed; medication list updated in electronic medical record  SDOH: [employer] for Life. No medication cost concerns.   Diabetes: Uncontrolled; current treatment: metformin XR 1500 mg daily, Ozempic 0.5 mg weekly; glipizide 5 mg daily;  Contacted [employer]. Patient has been APPROVED for [place] assistance for lifetime. Patient requested script be sent to [facility] mail order. Completed today.  Hypertension: Uncontrolled at last clinic; current treatment: amlodipine 2.5 mg daily, telmisartan 80 mg daily, HCTZ 12.5 mg daily Previously recommended to continue current regimen  Hyperlipidemia, Aortic Atherosclerosis: Could  consider tighter control given DM + aortic atherosclerosis, as well as risk factor of inflammatory conditions; current treatment: rosuvastatin 10 mg daily Current antiplatelet regimen: aspirin 81 mg daily Previously recommended to continue current regimen  Depression: Controlled per patient report; current regimen: escitalopram 5 mg daily Previously recommended to continue current regimen at this time  Psoriasis: Controlled per patient report; current regimen: methotrexate 7.5 mg once weekly, folic acid 1 mg daily Previously recommended to continue current regimen at this time along with collaboration with dermatology  Allergies: Controlled per patient report; current regimen: loratadine 10 mg daily, montelukast 10 mg daily Previously recommended to continue current regimen at this time  GERD: Controlled per patient report; current regimen: omeprazole 20 mg daily Previously recommended to continue current regimen at this time. Evaluate ability to reduce to PRN PPI or H2RA therapy moving forward.   Atrophic Vaginitis: Controlled per patient report; current regimen: premarin vaginal cream PRN Previously recommended to continue current regimen at this time.  Supplements: Vitamin D, Vitamin C, Calcium, MVI  Patient Goals/Self-Care Activities Over the next 90 days, patient will:  - take medications as prescribed check glucose twice daily, document, and provide at future appointments check blood pressure perioidically, document, and provide at future appointments  Follow Up Plan: Face to Face appointment with care management team member scheduled for:  ~3 weeks w/ PCP visit     Medication Assistance: None required.  Patient affirms current coverage meets needs.  Patient's preferred pharmacy is:  [id number] [address], [place] [address] Phone: [phone] Fax:  [phone]  [facility] [place], [place] [address] [place] [address] Phone: [phone] Fax: [phone]  [facility] [place], [place] [address] [place] [address] Phone: [phone] Fax: [phone]  Follow Up:  Patient agrees to Care Plan and Follow-up.   Plan: Face to Face appointment with care management team member scheduled for: 3 weeks  [name], PharmD, [place], [place] Clinical Pharmacist [employer] [facility] [employer] [phone]

## 2020-10-21 ENCOUNTER — Ambulatory Visit (INDEPENDENT_AMBULATORY_CARE_PROVIDER_SITE_OTHER): Payer: Medicare Other

## 2020-10-21 ENCOUNTER — Other Ambulatory Visit: Payer: Self-pay

## 2020-10-21 DIAGNOSIS — Z23 Encounter for immunization: Secondary | ICD-10-CM | POA: Diagnosis not present

## 2020-10-22 ENCOUNTER — Telehealth: Payer: Self-pay

## 2020-10-22 NOTE — Chronic Care Management (AMB) (Signed)
  Care Management   Note  10/22/2020 Name: [name] MRN: [id number] DOB: 04-Oct-1945  Emily Ryan is a 75 y.o. year old female who is a primary care patient of Derrel Nip, Aris Everts, MD and is actively engaged with the care management team. I reached out to Emily Ryan by phone today to assist with re-scheduling a follow up visit with the Pharmacist  Follow up plan: Telephone appointment with care management team member scheduled for:12/07/2020  Noreene Larsson, [place], [place], [place] [address] Direct Dial: [phone] [email or web address]  \

## 2020-10-28 ENCOUNTER — Other Ambulatory Visit: Payer: Self-pay | Admitting: Internal Medicine

## 2020-10-28 MED ORDER — METHOTREXATE 2.5 MG PO TABS: 7.5000 mg | ORAL_TABLET | ORAL | 1 refills | Status: DC

## 2020-10-28 MED ORDER — METHOTREXATE 2.5 MG PO TABS: 7.5000 mg | ORAL_TABLET | ORAL | 1 refills | Status: AC

## 2020-10-30 ENCOUNTER — Other Ambulatory Visit: Payer: Self-pay

## 2020-10-30 ENCOUNTER — Other Ambulatory Visit (INDEPENDENT_AMBULATORY_CARE_PROVIDER_SITE_OTHER): Payer: Medicare Other

## 2020-10-30 ENCOUNTER — Encounter: Payer: Self-pay | Admitting: Gastroenterology

## 2020-10-30 DIAGNOSIS — E1169 Type 2 diabetes mellitus with other specified complication: Secondary | ICD-10-CM | POA: Diagnosis not present

## 2020-10-30 DIAGNOSIS — Z23 Encounter for immunization: Secondary | ICD-10-CM | POA: Diagnosis not present

## 2020-10-30 DIAGNOSIS — E785 Hyperlipidemia, unspecified: Secondary | ICD-10-CM | POA: Diagnosis not present

## 2020-10-30 LAB — COMPREHENSIVE METABOLIC PANEL
ALT: 30 U/L (ref 0–35)
AST: 22 U/L (ref 0–37)
Albumin: 4.2 g/dL (ref 3.5–5.2)
Alkaline Phosphatase: 72 U/L (ref 39–117)
BUN: 13 mg/dL (ref 6–23)
CO2: 31 mEq/L (ref 19–32)
Calcium: 9 mg/dL (ref 8.4–10.5)
Chloride: 99 mEq/L (ref 96–112)
Creatinine, Ser: 0.68 mg/dL (ref 0.40–1.20)
GFR: 84.85 mL/min (ref >60.00)
Glucose, Bld: 186 mg/dL — ABNORMAL HIGH (ref 70–99)
Potassium: 4.2 mEq/L (ref 3.5–5.1)
Sodium: 138 mEq/L (ref 135–145)
Total Bilirubin: 0.7 mg/dL (ref 0.2–1.2)
Total Protein: 6.7 g/dL (ref 6.0–8.3)

## 2020-10-30 LAB — LIPID PANEL
Cholesterol: 141 mg/dL (ref 0–200)
HDL: 52.2 mg/dL (ref >39.00)
LDL Cholesterol: 58 mg/dL (ref 0–99)
NonHDL: 88.54
Total CHOL/HDL Ratio: 3
Triglycerides: 152 mg/dL — ABNORMAL HIGH (ref 0.0–149.0)
VLDL: 30.4 mg/dL (ref 0.0–40.0)

## 2020-10-30 LAB — HEMOGLOBIN A1C: Hgb A1c MFr Bld: 7.5 % — ABNORMAL HIGH (ref 4.6–6.5)

## 2020-11-02 ENCOUNTER — Encounter
Admission: RE | Disposition: A | Discharge status: Home / Self Care | Payer: Self-pay | Source: Home / Self Care | Attending: Gastroenterology

## 2020-11-02 ENCOUNTER — Ambulatory Visit: Payer: Medicare Other | Admitting: Certified Registered Nurse Anesthetist

## 2020-11-02 ENCOUNTER — Ambulatory Visit: Payer: Medicare Other | Admitting: Internal Medicine

## 2020-11-02 ENCOUNTER — Ambulatory Visit
Admission: RE | Admit: 2020-11-02 | Discharge: 2020-11-02 | Disposition: A | Discharge status: Home / Self Care | Payer: Medicare Other | Attending: Gastroenterology | Admitting: Gastroenterology

## 2020-11-02 DIAGNOSIS — K644 Residual hemorrhoidal skin tags: Secondary | ICD-10-CM | POA: Diagnosis not present

## 2020-11-02 DIAGNOSIS — Z79899 Other long term (current) drug therapy: Secondary | ICD-10-CM | POA: Diagnosis not present

## 2020-11-02 DIAGNOSIS — E119 Type 2 diabetes mellitus without complications: Secondary | ICD-10-CM | POA: Insufficient documentation

## 2020-11-02 DIAGNOSIS — D127 Benign neoplasm of rectosigmoid junction: Secondary | ICD-10-CM | POA: Insufficient documentation

## 2020-11-02 DIAGNOSIS — Z888 Allergy status to other drugs, medicaments and biological substances status: Secondary | ICD-10-CM | POA: Insufficient documentation

## 2020-11-02 DIAGNOSIS — Z803 Family history of malignant neoplasm of breast: Secondary | ICD-10-CM | POA: Insufficient documentation

## 2020-11-02 DIAGNOSIS — Z8249 Family history of ischemic heart disease and other diseases of the circulatory system: Secondary | ICD-10-CM | POA: Insufficient documentation

## 2020-11-02 DIAGNOSIS — Z860100 Personal history of colon polyps, unspecified: Secondary | ICD-10-CM

## 2020-11-02 DIAGNOSIS — Z7984 Long term (current) use of oral hypoglycemic drugs: Secondary | ICD-10-CM | POA: Diagnosis not present

## 2020-11-02 DIAGNOSIS — Z7982 Long term (current) use of aspirin: Secondary | ICD-10-CM | POA: Diagnosis not present

## 2020-11-02 DIAGNOSIS — K635 Polyp of colon: Secondary | ICD-10-CM | POA: Diagnosis not present

## 2020-11-02 DIAGNOSIS — Z7952 Long term (current) use of systemic steroids: Secondary | ICD-10-CM | POA: Insufficient documentation

## 2020-11-02 DIAGNOSIS — Z800 Family history of malignant neoplasm of digestive organs: Secondary | ICD-10-CM | POA: Insufficient documentation

## 2020-11-02 DIAGNOSIS — E782 Mixed hyperlipidemia: Secondary | ICD-10-CM | POA: Diagnosis not present

## 2020-11-02 DIAGNOSIS — E785 Hyperlipidemia, unspecified: Secondary | ICD-10-CM | POA: Diagnosis not present

## 2020-11-02 DIAGNOSIS — D123 Benign neoplasm of transverse colon: Secondary | ICD-10-CM | POA: Diagnosis not present

## 2020-11-02 DIAGNOSIS — E1169 Type 2 diabetes mellitus with other specified complication: Secondary | ICD-10-CM | POA: Diagnosis not present

## 2020-11-02 DIAGNOSIS — Z1211 Encounter for screening for malignant neoplasm of colon: Secondary | ICD-10-CM | POA: Diagnosis not present

## 2020-11-02 DIAGNOSIS — Z801 Family history of malignant neoplasm of trachea, bronchus and lung: Secondary | ICD-10-CM | POA: Insufficient documentation

## 2020-11-02 DIAGNOSIS — Z808 Family history of malignant neoplasm of other organs or systems: Secondary | ICD-10-CM | POA: Insufficient documentation

## 2020-11-02 DIAGNOSIS — L918 Other hypertrophic disorders of the skin: Secondary | ICD-10-CM | POA: Diagnosis not present

## 2020-11-02 DIAGNOSIS — Z833 Family history of diabetes mellitus: Secondary | ICD-10-CM | POA: Insufficient documentation

## 2020-11-02 DIAGNOSIS — K621 Rectal polyp: Secondary | ICD-10-CM | POA: Diagnosis not present

## 2020-11-02 HISTORY — PX: COLONOSCOPY WITH PROPOFOL: SHX5780

## 2020-11-02 LAB — GLUCOSE, CAPILLARY: Glucose-Capillary: 138 mg/dL — ABNORMAL HIGH (ref 70–99)

## 2020-11-02 SURGERY — COLONOSCOPY WITH PROPOFOL
Anesthesia: General

## 2020-11-02 MED ORDER — LIDOCAINE HCL (CARDIAC) PF 100 MG/5ML IV SOSY
PREFILLED_SYRINGE | INTRAVENOUS | Status: DC | PRN
  Administered 2020-11-02: 50 mg via INTRAVENOUS

## 2020-11-02 MED ORDER — SODIUM CHLORIDE 0.9 % IV SOLN
INTRAVENOUS | Status: DC
  Administered 2020-11-02: 20 mL/h via INTRAVENOUS

## 2020-11-02 MED ORDER — EPHEDRINE SULFATE 50 MG/ML IJ SOLN
INTRAMUSCULAR | Status: DC | PRN
  Administered 2020-11-02: 10 mg via INTRAVENOUS

## 2020-11-02 MED ORDER — EPHEDRINE 5 MG/ML INJ
INTRAVENOUS | Status: AC
  Filled 2020-11-02: qty 5

## 2020-11-02 MED ORDER — PROPOFOL 500 MG/50ML IV EMUL
INTRAVENOUS | Status: DC | PRN
  Administered 2020-11-02: 150 ug/kg/min via INTRAVENOUS

## 2020-11-02 MED ORDER — PROPOFOL 10 MG/ML IV BOLUS
INTRAVENOUS | Status: DC | PRN
  Administered 2020-11-02: 60 mg via INTRAVENOUS

## 2020-11-02 NOTE — Op Note (Signed)
[facility] Patient Name: Emily Ryan Procedure Date: 11/02/2020 10:08 AM MRN: [id number] Account #: [id number] Date of Birth: Jan 22, 1945 Admit Type: Outpatient Age: 75 Room: [facility] ENDO ROOM 1 Gender: Female Note Status: Finalized Instrument Name: Upper Endoscope [id number] Procedure:             Colonoscopy Indications:           Surveillance: Personal history of adenomatous polyps                         on last colonoscopy 3 years ago, Last colonoscopy:                         October 2019 Providers:             Lin Landsman MD, MD Referring MD:          Deborra Medina, MD (Referring MD) Medicines:             General Anesthesia Complications:         No immediate complications. Estimated blood loss: None. Procedure:             Pre-Anesthesia Assessment:                        - Prior to the procedure, a History and Physical was                         performed, and patient medications and allergies were                         reviewed. The patient is competent. The risks and                         benefits of the procedure and the sedation options and                         risks were discussed with the patient. All questions                         were answered and informed consent was obtained.                         Patient identification and proposed procedure were                         verified by the physician, the nurse, the                         anesthesiologist, the anesthetist and the technician                         in the pre-procedure area in the procedure room in the                         endoscopy suite. Mental Status Examination: alert and                         oriented. Airway Examination: normal oropharyngeal  airway and neck mobility. Respiratory Examination:                         clear to auscultation. CV Examination: normal.                         Prophylactic Antibiotics:  The patient does not require                         prophylactic antibiotics. Prior Anticoagulants: The                         patient has taken no previous anticoagulant or                         antiplatelet agents. ASA Grade Assessment: II - A                         patient with mild systemic disease. After reviewing                         the risks and benefits, the patient was deemed in                         satisfactory condition to undergo the procedure. The                         anesthesia plan was to use general anesthesia.                         Immediately prior to administration of medications,                         the patient was re-assessed for adequacy to receive                         sedatives. The heart rate, respiratory rate, oxygen                         saturations, blood pressure, adequacy of pulmonary                         ventilation, and response to care were monitored                         throughout the procedure. The physical status of the                         patient was re-assessed after the procedure.                        After obtaining informed consent, the colonoscope was                         passed under direct vision. Throughout the procedure,                         the patient's blood pressure, pulse, and oxygen  saturations were monitored continuously. The Endoscope                         was introduced through the anus and advanced to the                         the terminal ileum, with identification of the                         appendiceal orifice and IC valve. The colonoscopy was                         performed without difficulty. The patient tolerated                         the procedure well. The quality of the bowel                         preparation was evaluated using the BBPS [facility] Bowel                         Preparation Scale) with scores of: Right Colon = 3,                          Transverse Colon = 3 and Left Colon = 3 (entire mucosa                         seen well with no residual staining, small fragments                         of stool or opaque liquid). The total BBPS score                         equals 9. Findings:      Skin tags were found on perianal exam.      The terminal ileum appeared normal.      Two sessile and semi-pedunculated polyps were found in the recto-sigmoid       colon and transverse colon. The polyps were 3 to 6 mm in size. These       polyps were removed with a cold snare. Resection and retrieval were       complete.      s/p sigmoid resection for stricture. No evidence of prior stricture      Non-bleeding external hemorrhoids were found during retroflexion. The       hemorrhoids were medium-sized. Impression:            - Perianal skin tags found on perianal exam.                        - The examined portion of the ileum was normal.                        - Two 3 to 6 mm polyps at the recto-sigmoid colon and                         in the transverse colon, removed with a cold snare.  Resected and retrieved.                        - Non-bleeding external hemorrhoids. Recommendation:        - Discharge patient to home (with escort).                        - Resume previous diet today.                        - Continue present medications.                        - Await pathology results.                        - Repeat colonoscopy in 5 years for surveillance. Procedure Code(s):     --- Professional ---                        [phone], Colonoscopy, flexible; with removal of                         tumor(s), polyp(s), or other lesion(s) by snare                         technique Diagnosis Code(s):     --- Professional ---                        K64.4, Residual hemorrhoidal skin tags                        Z86.010, Personal history of colonic polyps                        K63.5, Polyp of colon CPT copyright 2019  American Medical Association. All rights reserved. The codes documented in this report are preliminary and upon coder review may  be revised to meet current compliance requirements. Dr. Ulyess Mort Lin Landsman MD, MD 11/02/2020 10:33:18 AM This report has been signed electronically. Number of Addenda: 0 Note Initiated On: 11/02/2020 10:08 AM Scope Withdrawal Time: 0 hours 8 minutes 33 seconds  Total Procedure Duration: 0 hours 9 minutes 38 seconds  Estimated Blood Loss:  Estimated blood loss: none. Estimated blood loss: none.      [facility]

## 2020-11-02 NOTE — Transfer of Care (Signed)
Immediate Anesthesia Transfer of Care Note  Patient: Emily Ryan  Procedure(s) Performed: COLONOSCOPY WITH PROPOFOL  Patient Location: Endoscopy Unit  Anesthesia Type:General  Level of Consciousness: awake and alert   Airway & Oxygen Therapy: Patient Spontanous Breathing  Post-op Assessment: Report given to RN and Post -op Vital signs reviewed and stable  Post vital signs: Reviewed and stable  Last Vitals:  Vitals Value Taken Time  BP 131/75 11/02/20 1036  Temp    Pulse 78 11/02/20 1038  Resp 15 11/02/20 1038  SpO2 96 % 11/02/20 1038  Vitals shown include unvalidated device data.  Last Pain:  Vitals:   11/02/20 1036  TempSrc:   PainSc: 0-No pain         Complications: No notable events documented.

## 2020-11-02 NOTE — Anesthesia Preprocedure Evaluation (Signed)
Anesthesia Evaluation  Patient identified by MRN, date of birth, ID band Patient awake    Reviewed: Allergy & Precautions, NPO status , Patient's Chart, lab work & pertinent test results  History of Anesthesia Complications Negative for: history of anesthetic complications  Airway Mallampati: II  TM Distance: >3 FB Neck ROM: Full    Dental no notable dental hx.    Pulmonary neg pulmonary ROS, neg sleep apnea, neg COPD,    breath sounds clear to auscultation- rhonchi (-) wheezing      Cardiovascular hypertension, Pt. on medications (-) CAD, (-) Past MI, (-) Cardiac Stents and (-) CABG  Rhythm:Regular Rate:Normal - Systolic murmurs and - Diastolic murmurs    Neuro/Psych neg Seizures negative neurological ROS  negative psych ROS   GI/Hepatic Neg liver ROS, GERD  ,  Endo/Other  diabetes, Oral Hypoglycemic Agents  Renal/GU negative Renal ROS     Musculoskeletal  (+) Arthritis ,   Abdominal (+) - obese,   Peds  Hematology negative hematology ROS (+)   Anesthesia Other Findings Past Medical History: No date: Complicated pregnancy     Comment:  1st pregnancy complicated by post operative hemorrhage               and 2ng complicated by epidural No date: Diabetes mellitus No date: Diverticulosis of colon (without mention of hemorrhage) 12/09/2017: Fracture of malleolus of right ankle, closed, with routine  healing, subsequent encounter No date: GERD (gastroesophageal reflux disease) 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 No date: Hyperlipidemia No date: Hypertension No date: Menopausal disorder No date: Rectal stricture No date: Skin cancer   Reproductive/Obstetrics                             Anesthesia Physical Anesthesia Plan  ASA: 2  Anesthesia Plan: General   Post-op Pain Management:    Induction: Intravenous  PONV Risk Score and Plan: 2 and Propofol infusion  Airway Management Planned:  Natural Airway  Additional Equipment:   Intra-op Plan:   Post-operative Plan:   Informed Consent: I have reviewed the patients History and Physical, chart, labs and discussed the procedure including the risks, benefits and alternatives for the proposed anesthesia with the patient or authorized representative who has indicated his/her understanding and acceptance.     Dental advisory given  Plan Discussed with: CRNA and Anesthesiologist  Anesthesia Plan Comments:         Anesthesia Quick Evaluation

## 2020-11-02 NOTE — Anesthesia Postprocedure Evaluation (Signed)
Anesthesia Post Note  Patient: [name]  Procedure(s) Performed: COLONOSCOPY WITH PROPOFOL  Patient location during evaluation: Endoscopy Anesthesia Type: General Level of consciousness: awake and alert and oriented Pain management: pain level controlled Vital Signs Assessment: post-procedure vital signs reviewed and stable Respiratory status: spontaneous breathing, nonlabored ventilation and respiratory function stable Cardiovascular status: blood pressure returned to baseline and stable Postop Assessment: no signs of nausea or vomiting Anesthetic complications: no   No notable events documented.   Last Vitals:  Vitals:   11/02/20 0958 11/02/20 1036  BP: (!) 148/77 131/75  Pulse: 73 78  Resp: 20   Temp: (!) 35.9 C   SpO2: 97% 96%    Last Pain:  Vitals:   11/02/20 1036  TempSrc:   PainSc: 0-No pain                 Stacy Deshler

## 2020-11-02 NOTE — H&P (Signed)
[name], MD [address]  [place]  [place], [place] [address]  Main: [phone]  Fax: [phone] Pager: [phone]  Primary Care Physician:  Crecencio Mc, MD Primary Gastroenterologist:  Dr. [name]  Pre-Procedure History & Physical: HPI:  Emily Ryan is a 75 y.o. female is here for an colonoscopy.   Past Medical History:  Diagnosis Date   Complicated pregnancy    1st pregnancy complicated by post operative hemorrhage and 2ng complicated by epidural   Diabetes mellitus    Diverticulosis of colon (without mention of hemorrhage)    Fracture of malleolus of right ankle, closed, with routine healing, subsequent encounter 12/09/2017   GERD (gastroesophageal reflux disease)    Guttate psoriasis    Hyperlipidemia    Hypertension    Menopausal disorder    Rectal stricture    Skin cancer     Past Surgical History:  Procedure Laterality Date   ABDOMINAL HYSTERECTOMY     APPENDECTOMY  Dec 2012   BLADDER REPAIR  1991   lift    COLON RESECTION SIGMOID N/A 02/19/2018   Procedure: LAPAROSCOPIC SIGMOID COLECTOMY- POSSIBLE OPEN;  Surgeon: Olean Ree, MD;  Location: [facility] ORS;  Service: General;  Laterality: N/A;   COLONOSCOPY WITH PROPOFOL N/A 07/17/2017   Procedure: COLONOSCOPY WITH PROPOFOL;  Surgeon: Lin Landsman, MD;  Location: [facility] ENDOSCOPY;  Service: Gastroenterology;  Laterality: N/A;   COLONOSCOPY WITH PROPOFOL N/A 11/02/2017   Procedure: COLONOSCOPY WITH PROPOFOL;  Surgeon: Lin Landsman, MD;  Location: [facility] ENDOSCOPY;  Service: Gastroenterology;  Laterality: N/A;   CYSTOCELE REPAIR     CYSTOSCOPY W/ URETERAL STENT PLACEMENT Bilateral 02/19/2018   Procedure: CYSTOSCOPY WITH STENT PLACEMENT GOING 1ST;  Surgeon: Billey Co, MD;  Location: [facility] ORS;  Service: Urology;  Laterality: Bilateral;   FLEXIBLE SIGMOIDOSCOPY N/A 07/31/2017   Procedure: FLEXIBLE SIGMOIDOSCOPY;  Surgeon: Lin Landsman, MD;  Location: [facility] ENDOSCOPY;   Service: Gastroenterology;  Laterality: N/A;   HERNIA REPAIR     MOHS SURGERY N/A 05/2015   Face   PARTIAL COLECTOMY  Nov 2012   left, secondary to diverticular perf   RECTOCELE REPAIR     REVERSE SHOULDER ARTHROPLASTY Right 02/26/2020   Procedure: REVERSE SHOULDER ARTHROPLASTY;  Surgeon: Corky Mull, MD;  Location: [facility] ORS;  Service: Orthopedics;  Laterality: Right;   SEPTOPLASTY  1969   TEE WITHOUT CARDIOVERSION N/A 11/24/2015   Procedure: TRANSESOPHAGEAL ECHOCARDIOGRAM (TEE);  Surgeon: Minna Merritts, MD;  Location: [facility] ORS;  Service: Cardiovascular;  Laterality: N/A;   [place]   right leg     Prior to Admission medications   Medication Sig Start Date End Date Taking? Authorizing Provider  amLODipine (NORVASC) 2.5 MG tablet Take 1 tablet (2.5 mg total) by mouth every evening. 06/11/20  Yes Crecencio Mc, MD  Ascorbic Acid (VITAMIN C) 1000 MG tablet Take 1,000 mg by mouth daily.    Yes [provider]  aspirin EC 81 MG tablet Take 81 mg by mouth at bedtime. Swallow whole.   Yes [provider]  Calcium Carbonate-Vitamin D3 600-400 MG-UNIT TABS Take 1 tablet by mouth daily.   Yes [provider]  clobetasol cream (TEMOVATE) 6.38 % Apply 1 application topically 2 (two) times daily as needed (psoriasis). 04/22/19  Yes [provider]  conjugated estrogens (PREMARIN) vaginal cream Place 1 Applicatorful vaginally daily. Patient taking differently: Place 1 Applicatorful vaginally daily as needed (  dryness/irritation). 05/08/19  Yes Rubie Maid, MD  cyclobenzaprine (FLEXERIL) 5 MG tablet Take 1 tablet (5 mg total) by mouth 3 (three) times daily as needed. 07/30/20  Yes Crecencio Mc, MD  escitalopram (LEXAPRO) 5 MG tablet Take 1 tablet (5 mg total) by mouth every evening. 07/30/20  Yes Crecencio Mc, MD  folic acid (FOLVITE) 1 MG tablet Take 1 tablet (1 mg total) by mouth See admin instructions. Takes daily  except on Saturday 03/13/20  Yes Crecencio Mc, MD  glipiZIDE (GLUCOTROL) 5 MG tablet Take 1 tablet (5 mg total) by mouth daily before breakfast. 08/18/20  Yes Crecencio Mc, MD  glucose blood (FREESTYLE LITE) test strip Use as instructed to test blood sugar twice daily 01/17/20  Yes Crecencio Mc, MD  hydrochlorothiazide (HYDRODIURIL) 12.5 MG tablet Take 1 tablet (12.5 mg total) by mouth daily. 12/12/19  Yes Crecencio Mc, MD  loratadine (CLARITIN) 10 MG tablet Take 1 tablet (10 mg total) by mouth daily. 07/30/20  Yes Crecencio Mc, MD  metFORMIN (GLUCOPHAGE XR) 750 MG 24 hr tablet Take 2 tablets (1,500 mg total) by mouth daily with breakfast. 04/22/20  Yes Crecencio Mc, MD  methotrexate (RHEUMATREX) 2.5 MG tablet Take 3 tablets (7.5 mg total) by mouth once a week. Caution:Chemotherapy. Protect from light. 10/28/20  Yes Crecencio Mc, MD  montelukast (SINGULAIR) 10 MG tablet Take 1 tablet (10 mg total) by mouth at bedtime. 12/12/19  Yes Crecencio Mc, MD  Multiple Vitamin (MULTIVITAMIN) capsule Take 1 capsule by mouth daily.   Yes [provider]  omeprazole (PRILOSEC) 20 MG capsule Take 1 capsule (20 mg total) by mouth daily. 07/30/20  Yes Crecencio Mc, MD  Precision Thin Lancets MISC Use as directed to check sugars twice daily 01/27/12  Yes Crecencio Mc, MD  rosuvastatin (CRESTOR) 10 MG tablet Take 1 tablet (10 mg total) by mouth daily. 08/19/20  Yes Crecencio Mc, MD  Semaglutide,0.25 or 0.5MG/DOS, (OZEMPIC, 0.25 OR 0.5 MG/DOSE,) 2 MG/1.5ML SOPN Inject 0.5 mg into the skin once a week. 10/19/20  Yes Crecencio Mc, MD  telmisartan (MICARDIS) 80 MG tablet Take 1 tablet (80 mg total) by mouth daily. 07/30/20  Yes Crecencio Mc, MD  vitamin E 400 UNIT capsule Take 400 Units by mouth daily.   Yes [provider]  [place] KIT 17.5-3.13-1.6 GM/177ML SOLN SMARTSIG:354 Milliliter(s) By Mouth Once Patient not taking: Reported on 08/18/2020 08/10/20   [provider]  traMADol (ULTRAM) 50 MG tablet Take 1 tablet (50 mg total) by mouth every 6 (six) hours as needed (Breakthrough pain). Patient not taking: No sig reported 02/26/20 02/25/21  Poggi, Marshall Cork, MD    Allergies as of 08/10/2020 - Review Complete 07/30/2020  Allergen Reaction Noted   Prednisone Hives 03/11/2020    Family History  Problem Relation Age of Onset   Heart Problems Mother    Macular degeneration Mother    Cancer Father    Diabetes Sister    Dementia Sister    Diabetes Brother    Stroke Brother    Diabetes Brother    Melanoma Brother    Breast cancer Maternal Aunt    Breast cancer Paternal Aunt    Cancer Maternal Grandmother        colon ca   Lung cancer Cousin    Lung disease Neg Hx    Rheumatologic disease Neg Hx     Social History  Socioeconomic History   Marital status: Married    Spouse name: Not on file   Number of children: Not on file   Years of education: Not on file   Highest education level: Not on file  Occupational History   Not on file  Tobacco Use   Smoking status: Never   Smokeless tobacco: Never   Tobacco comments:    father growing up  Vaping Use   Vaping Use: Never used  Substance and Sexual Activity   Alcohol use: Yes    Comment: Occasional   Drug use: No   Sexual activity: Yes    Birth control/protection: Post-menopausal  Other Topics Concern   Not on file  Social History Narrative   [facility] [place] [facility] (11/14/16):   She is originally from [place]. Previously has worked as a Network engineer, in [employer], & in [employer]. Her husband was in [employer] and they traveled extensively. She lived in [place], [place], [place], [place], [place], [place]. She has 2 cats currently. Remote parakeet exposure as a child. No mold exposure. No recent hot tub exposure.    Social Determinants of Health   Financial Resource Strain: Low Risk    Difficulty of Paying Living Expenses: Not hard at all  Food Insecurity: No Food Insecurity   Worried About Sales executive in the Last Year: Never true   [place] in the Last Year: Never true  Transportation Needs: No Transportation Needs   Lack of Transportation (Medical): No   Lack of Transportation (Non-Medical): No  Physical Activity: Sufficiently Active   Days of Exercise per Week: 4 days   Minutes of Exercise per Session: 90 min  Stress: No Stress Concern Present   Feeling of Stress : Not at all  Social Connections: Socially Integrated   Frequency of Communication with Friends and Family: More than three times a week   Frequency of Social Gatherings with Friends and Family: More than three times a week   Attends Religious Services: More than 4 times per year   Active Member of [employer] or Organizations: Yes   Attends Music therapist: Not on file   Marital Status: Married  Human resources officer Violence: Not At Risk   Fear of Current or Ex-Partner: No   Emotionally Abused: No   Physically Abused: No   Sexually Abused: No    Review of Systems: See HPI, otherwise negative ROS  Physical Exam: BP (!) 148/77   Pulse 73   Temp (!) 96.7 F (35.9 C) (Temporal)   Resp 20   Ht 4' 11"  (1.499 m)   Wt 59 kg   SpO2 97%   BMI 26.26 kg/m  General:   Alert,  pleasant and cooperative in NAD Head:  Normocephalic and atraumatic. Neck:  Supple; no masses or thyromegaly. Lungs:  Clear throughout to auscultation.    Heart:  Regular rate and rhythm. Abdomen:  Soft, nontender and nondistended. Normal bowel sounds, without guarding, and without rebound.   Neurologic:  Alert and  oriented x4;  grossly normal neurologically.  Impression/Plan: Emily Ryan is here for an colonoscopy to be performed for h/o colon polyps  Risks, benefits, limitations, and alternatives regarding  colonoscopy have been reviewed with the patient.  Questions have been answered.  All parties agreeable.   Sherri Sear, MD  11/02/2020, 10:01 AM

## 2020-11-03 ENCOUNTER — Encounter: Payer: Self-pay | Admitting: Internal Medicine

## 2020-11-03 ENCOUNTER — Other Ambulatory Visit: Payer: Self-pay

## 2020-11-03 ENCOUNTER — Ambulatory Visit: Payer: Medicare Other

## 2020-11-03 ENCOUNTER — Ambulatory Visit (INDEPENDENT_AMBULATORY_CARE_PROVIDER_SITE_OTHER): Payer: Medicare Other | Admitting: Internal Medicine

## 2020-11-03 DIAGNOSIS — E1169 Type 2 diabetes mellitus with other specified complication: Secondary | ICD-10-CM | POA: Diagnosis not present

## 2020-11-03 DIAGNOSIS — I10 Essential (primary) hypertension: Secondary | ICD-10-CM | POA: Diagnosis not present

## 2020-11-03 DIAGNOSIS — E782 Mixed hyperlipidemia: Secondary | ICD-10-CM | POA: Diagnosis not present

## 2020-11-03 LAB — SURGICAL PATHOLOGY

## 2020-11-03 MED ORDER — OZEMPIC (1 MG/DOSE) 2 MG/1.5ML ~~LOC~~ SOPN: 1.0000 mg | PEN_INJECTOR | SUBCUTANEOUS | 0 refills | Status: DC

## 2020-11-03 MED ORDER — MONTELUKAST SODIUM 10 MG PO TABS: 10.0000 mg | ORAL_TABLET | Freq: Every day | ORAL | 3 refills | Status: DC

## 2020-11-03 MED ORDER — GLIPIZIDE 5 MG PO TABS: 5.0000 mg | ORAL_TABLET | Freq: Every day | ORAL | 3 refills | Status: DC

## 2020-11-03 MED ORDER — HYDROCHLOROTHIAZIDE 12.5 MG PO TABS: 12.5000 mg | ORAL_TABLET | Freq: Every day | ORAL | 3 refills | Status: DC

## 2020-11-03 NOTE — Progress Notes (Signed)
Subjective:  Patient ID: Emily Ryan, female    DOB: 29-Aug-1945  Age: 75 y.o. MRN: [id number]  CC: Diagnoses of DM type 2 with diabetic mixed hyperlipidemia ([place]) and White coat syndrome with diagnosis of hypertension were pertinent to this visit.  HPI [name] presents for   follow up ontype 2 DM, hypertension and hyperlipidemia Chief Complaint  Patient presents with   Follow-up    3 month follow up on Diabetes   This visit occurred during the SARS-CoV-2 public health emergency.  Safety protocols were in place, including screening questions prior to the visit, additional usage of staff PPE, and extensive cleaning of exam room while observing appropriate contact time as indicated for disinfecting solutions.   3 month follow up on Type 2 DM, Hypertension, hyperlipidemia and overweight  . Has been using ozempic at 0.5   for  3 weeks and has a 3 month supply of the 0.5 mg from mail order.  CBS pre colonoscopy (yesterday) tolerating current regimen without nausea and hypoglycemia.  Tolerating higher potency statin   Htn:  HOME READINGS DONE BUT NOT BROUGHT IN.  States that reading at home are lower than in office    Outpatient Medications Prior to Visit  Medication Sig Dispense Refill   amLODipine (NORVASC) 2.5 MG tablet Take 1 tablet (2.5 mg total) by mouth every evening. 90 tablet 1   Ascorbic Acid (VITAMIN C) 1000 MG tablet Take 1,000 mg by mouth daily.      aspirin EC 81 MG tablet Take 81 mg by mouth at bedtime. Swallow whole.     Calcium Carbonate-Vitamin D3 600-400 MG-UNIT TABS Take 1 tablet by mouth daily.     clobetasol cream (TEMOVATE) 8.10 % Apply 1 application topically 2 (two) times daily as needed (psoriasis).     conjugated estrogens (PREMARIN) vaginal cream Place 1 Applicatorful vaginally daily. (Patient taking differently: Place 1 Applicatorful vaginally daily as needed (dryness/irritation).) 42.5 g 3   cyclobenzaprine (FLEXERIL) 5 MG tablet Take 1 tablet (5 mg  total) by mouth 3 (three) times daily as needed. 90 tablet 0   escitalopram (LEXAPRO) 5 MG tablet Take 1 tablet (5 mg total) by mouth every evening. 90 tablet 3   folic acid (FOLVITE) 1 MG tablet Take 1 tablet (1 mg total) by mouth See admin instructions. Takes daily except on Saturday 90 tablet 3   glipiZIDE (GLUCOTROL) 5 MG tablet Take 1 tablet (5 mg total) by mouth daily before breakfast. 30 tablet 1   glucose blood (FREESTYLE LITE) test strip Use as instructed to test blood sugar twice daily 200 each 3   hydrochlorothiazide (HYDRODIURIL) 12.5 MG tablet Take 1 tablet (12.5 mg total) by mouth daily. 90 tablet 3   loratadine (CLARITIN) 10 MG tablet Take 1 tablet (10 mg total) by mouth daily. 90 tablet 3   metFORMIN (GLUCOPHAGE XR) 750 MG 24 hr tablet Take 2 tablets (1,500 mg total) by mouth daily with breakfast. 180 tablet 1   methotrexate (RHEUMATREX) 2.5 MG tablet Take 3 tablets (7.5 mg total) by mouth once a week. Caution:Chemotherapy. Protect from light. 36 tablet 1   montelukast (SINGULAIR) 10 MG tablet Take 1 tablet (10 mg total) by mouth at bedtime. 90 tablet 3   Multiple Vitamin (MULTIVITAMIN) capsule Take 1 capsule by mouth daily.     omeprazole (PRILOSEC) 20 MG capsule Take 1 capsule (20 mg total) by mouth daily. 90 capsule 3   Precision Thin Lancets MISC Use as directed to check  sugars twice daily 180 each 3   rosuvastatin (CRESTOR) 10 MG tablet Take 1 tablet (10 mg total) by mouth daily. 90 tablet 3   telmisartan (MICARDIS) 80 MG tablet Take 1 tablet (80 mg total) by mouth daily. 90 tablet 1   vitamin E 400 UNIT capsule Take 400 Units by mouth daily.     Semaglutide,0.25 or 0.5MG /DOS, (OZEMPIC, 0.25 OR 0.5 MG/DOSE,) 2 MG/1.5ML SOPN Inject 0.5 mg into the skin once a week. 4.5 mL 3   No facility-administered medications prior to visit.    Review of Systems;  Patient denies headache, fevers, malaise, unintentional weight loss, skin rash, eye pain, sinus congestion and sinus pain,  sore throat, dysphagia,  hemoptysis , cough, dyspnea, wheezing, chest pain, palpitations, orthopnea, edema, abdominal pain, nausea, melena, diarrhea, constipation, flank pain, dysuria, hematuria, urinary  Frequency, nocturia, numbness, tingling, seizures,  Focal weakness, Loss of consciousness,  Tremor, insomnia, depression, anxiety, and suicidal ideation.      Objective:  BP (!) 146/70 (BP Location: Left Arm, Patient Position: Sitting, Cuff Size: Normal)   Pulse 88   Temp (!) 96.5 F (35.8 C) (Temporal)   Ht 4\' 11"  (1.499 m)   Wt 132 lb (59.9 kg)   SpO2 95%   BMI 26.66 kg/m   BP Readings from Last 3 Encounters:  11/03/20 (!) 146/70  11/02/20 131/75  07/30/20 (!) 142/68    Wt Readings from Last 3 Encounters:  11/03/20 132 lb (59.9 kg)  11/02/20 130 lb (59 kg)  08/26/20 132 lb (59.9 kg)    General appearance: alert, cooperative and appears stated age Ears: normal TM's and external ear canals both ears Throat: lips, mucosa, and tongue normal; teeth and gums normal Neck: no adenopathy, no carotid bruit, supple, symmetrical, trachea midline and thyroid not enlarged, symmetric, no tenderness/mass/nodules Back: symmetric, no curvature. ROM normal. No CVA tenderness. Lungs: clear to auscultation bilaterally Heart: regular rate and rhythm, S1, S2 normal, no murmur, click, rub or gallop Abdomen: soft, non-tender; bowel sounds normal; no masses,  no organomegaly Pulses: 2+ and symmetric Skin: Skin color, texture, turgor normal. No rashes or lesions Lymph nodes: Cervical, supraclavicular, and axillary nodes normal.  Lab Results  Component Value Date   HGBA1C 7.5 (H) 10/30/2020   HGBA1C 8.0 (H) 07/29/2020   HGBA1C 7.5 (H) 04/16/2020    Lab Results  Component Value Date   CREATININE 0.68 10/30/2020   CREATININE 0.61 07/29/2020   CREATININE 0.62 05/27/2020    Lab Results  Component Value Date   WBC 6.0 07/29/2020   HGB 13.6 07/29/2020   HCT 40.2 07/29/2020   PLT 290.0  07/29/2020   GLUCOSE 186 (H) 10/30/2020   CHOL 141 10/30/2020   TRIG 152.0 (H) 10/30/2020   HDL 52.20 10/30/2020   LDLDIRECT 72.0 04/12/2018   LDLCALC 58 10/30/2020   ALT 30 10/30/2020   AST 22 10/30/2020   NA 138 10/30/2020   K 4.2 10/30/2020   CL 99 10/30/2020   CREATININE 0.68 10/30/2020   BUN 13 10/30/2020   CO2 31 10/30/2020   TSH 3.11 01/14/2020   HGBA1C 7.5 (H) 10/30/2020   MICROALBUR 3.6 (H) 07/29/2020    MM 3D SCREEN BREAST BILATERAL  Result Date: 10/09/2020 CLINICAL DATA:  Screening. EXAM: DIGITAL SCREENING BILATERAL MAMMOGRAM WITH TOMOSYNTHESIS AND CAD TECHNIQUE: Bilateral screening digital craniocaudal and mediolateral oblique mammograms were obtained. Bilateral screening digital breast tomosynthesis was performed. The images were evaluated with computer-aided detection. COMPARISON:  Previous exam(s). ACR Breast Density Category b: There  are scattered areas of fibroglandular density. FINDINGS: There are no findings suspicious for malignancy. IMPRESSION: No mammographic evidence of malignancy. A result letter of this screening mammogram will be mailed directly to the patient. RECOMMENDATION: Screening mammogram in one year. (Code:SM-B-01Y) BI-RADS CATEGORY  1: Negative. Electronically Signed   By: Abelardo Diesel M.D.   On: 10/09/2020 09:20   Assessment & Plan:   Problem List Items Addressed This Visit     DM type 2 with diabetic mixed hyperlipidemia ([place])    Improving a1c without hypoglycemic episodes with initiation of ozempic and reduction of glipizide,  no weight loss yet and A1c still 7.5  Advised to increase dose of ozempic to 1,0 mg using current supply of 0.5 mg pens , continue metformin and glipizide at current doses.  LDL is at goal < 70 with Crestor 10 mg and LFT's are normal  Lab Results  Component Value Date   CHOL 141 10/30/2020   HDL 52.20 10/30/2020   LDLCALC 58 10/30/2020   LDLDIRECT 72.0 04/12/2018   TRIG 152.0 (H) 10/30/2020   CHOLHDL 3 10/30/2020    Lab Results  Component Value Date   ALT 30 10/30/2020   AST 22 10/30/2020   ALKPHOS 72 10/30/2020   BILITOT 0.7 10/30/2020         Relevant Medications   Semaglutide, 1 MG/DOSE, (OZEMPIC, 1 MG/DOSE,) 2 MG/1.5ML SOPN   Other Relevant Orders   Hemoglobin A1c   Comprehensive metabolic panel   White coat syndrome with diagnosis of hypertension    she reports compliance with medication regimen  but has an elevated reading today in office.  She is not using NSAIDs daily.  Discussed goal of  (130/80 for patients over 70)  to preserve renal function.  She has been asked to  submither home readings for evaluation If most are >130.80,  Will increaseamlodipine to 5 mg daily and continue telmisartan 80 mg daily   Renal function, electrolytes and screen for proteinuria are all normal  If home readings are not elevated,  She will be brought in for RN visit for home BP machine calibration   Lab Results  Component Value Date   CREATININE 0.68 10/30/2020   Lab Results  Component Value Date   NA 138 10/30/2020   K 4.2 10/30/2020   CL 99 10/30/2020   CO2 31 10/30/2020          I have discontinued [name] A. Revelo's Ozempic (0.25 or 0.5 MG/DOSE). I am also having her start on Ozempic (1 MG/DOSE). Additionally, I am having her maintain her vitamin E, multivitamin, Precision Thin Lancets, vitamin C, clobetasol cream, Premarin, montelukast, hydrochlorothiazide, FREESTYLE LITE, aspirin EC, Calcium Carbonate-Vitamin D3, folic acid, metFORMIN, amLODipine, telmisartan, cyclobenzaprine, escitalopram, loratadine, omeprazole, glipiZIDE, rosuvastatin, and methotrexate.  Meds ordered this encounter  Medications   Semaglutide, 1 MG/DOSE, (OZEMPIC, 1 MG/DOSE,) 2 MG/1.5ML SOPN    Sig: Inject 1 mg into the skin once a week.    Dispense:  4.5 mL    Refill:  0    NOTE DOSE INCREASE TO 1.0 MG .KEEP ON FILE FOR NEXT REFILL IN  45 DAYS    Medications Discontinued During This Encounter  Medication Reason    Semaglutide,0.25 or 0.5MG /DOS, (OZEMPIC, 0.25 OR 0.5 MG/DOSE,) 2 MG/1.5ML SOPN     Follow-up: No follow-ups on file.   Crecencio Mc, MD

## 2020-11-03 NOTE — Assessment & Plan Note (Signed)
she reports compliance with medication regimen  but has an elevated reading today in office.  She is not using NSAIDs daily.  Discussed goal of  (130/80 for patients over 70)  to preserve renal function.  She has been asked to  submither home readings for evaluation If most are >130.80,  Will increaseamlodipine to 5 mg daily and continue telmisartan 80 mg daily   Renal function, electrolytes and screen for proteinuria are all normal  If home readings are not elevated,  She will be brought in for RN visit for home BP machine calibration   Lab Results  Component Value Date   CREATININE 0.68 10/30/2020   Lab Results  Component Value Date   NA 138 10/30/2020   K 4.2 10/30/2020   CL 99 10/30/2020   CO2 31 10/30/2020

## 2020-11-03 NOTE — Assessment & Plan Note (Addendum)
Improving a1c without hypoglycemic episodes with initiation of ozempic and reduction of glipizide,  no weight loss yet and A1c still 7.5  Advised to increase dose of ozempic to 1,0 mg using current supply of 0.5 mg pens , continue metformin and glipizide at current doses.  LDL is at goal < 70 with Crestor 10 mg and LFT's are normal  Lab Results  Component Value Date   CHOL 141 10/30/2020   HDL 52.20 10/30/2020   LDLCALC 58 10/30/2020   LDLDIRECT 72.0 04/12/2018   TRIG 152.0 (H) 10/30/2020   CHOLHDL 3 10/30/2020   Lab Results  Component Value Date   ALT 30 10/30/2020   AST 22 10/30/2020   ALKPHOS 72 10/30/2020   BILITOT 0.7 10/30/2020

## 2020-11-03 NOTE — Patient Instructions (Addendum)
Our goal is 130/80 or lower  for blood pressure .    IF the majority of your home readings are higher than that (eIther number) we will need to increase dose of amlodipine from 25 gm daily to 5 mg daily   IF THE MAJORITY ARE LOWER THAN 130/80, let me know so we can schedule a NURSE VISIT TO CHECK YOUR MACHINE FOR ACCURACY   Let me know today or tomorrow    Ozempic :  I have increased YOUR  dose today to 1.0 mg by giving yourself 2 shots each week

## 2020-11-05 ENCOUNTER — Encounter: Payer: Self-pay | Admitting: Gastroenterology

## 2020-11-09 DIAGNOSIS — Z20822 Contact with and (suspected) exposure to covid-19: Secondary | ICD-10-CM | POA: Diagnosis not present

## 2020-12-07 ENCOUNTER — Ambulatory Visit: Payer: Medicare Other

## 2020-12-09 DIAGNOSIS — L400 Psoriasis vulgaris: Secondary | ICD-10-CM | POA: Diagnosis not present

## 2020-12-09 DIAGNOSIS — D045 Carcinoma in situ of skin of trunk: Secondary | ICD-10-CM | POA: Diagnosis not present

## 2020-12-09 DIAGNOSIS — Z872 Personal history of diseases of the skin and subcutaneous tissue: Secondary | ICD-10-CM | POA: Diagnosis not present

## 2020-12-09 DIAGNOSIS — Z08 Encounter for follow-up examination after completed treatment for malignant neoplasm: Secondary | ICD-10-CM | POA: Diagnosis not present

## 2020-12-09 DIAGNOSIS — D485 Neoplasm of uncertain behavior of skin: Secondary | ICD-10-CM | POA: Diagnosis not present

## 2020-12-09 DIAGNOSIS — Z85828 Personal history of other malignant neoplasm of skin: Secondary | ICD-10-CM | POA: Diagnosis not present

## 2020-12-11 ENCOUNTER — Encounter: Payer: Self-pay | Admitting: Internal Medicine

## 2020-12-11 DIAGNOSIS — E1169 Type 2 diabetes mellitus with other specified complication: Secondary | ICD-10-CM

## 2020-12-12 ENCOUNTER — Other Ambulatory Visit: Payer: Self-pay | Admitting: Internal Medicine

## 2020-12-17 ENCOUNTER — Encounter: Payer: Self-pay | Admitting: Internal Medicine

## 2020-12-17 ENCOUNTER — Encounter: Payer: Medicare Other | Admitting: Obstetrics and Gynecology

## 2020-12-17 MED ORDER — METFORMIN HCL ER 750 MG PO TB24: 1500.0000 mg | ORAL_TABLET | Freq: Every day | ORAL | 3 refills | Status: DC

## 2020-12-18 ENCOUNTER — Encounter: Payer: Medicare Other | Admitting: Obstetrics and Gynecology

## 2020-12-21 DIAGNOSIS — L400 Psoriasis vulgaris: Secondary | ICD-10-CM | POA: Diagnosis not present

## 2020-12-23 ENCOUNTER — Other Ambulatory Visit: Payer: Self-pay

## 2020-12-24 MED ORDER — OZEMPIC (1 MG/DOSE) 4 MG/3ML ~~LOC~~ SOPN: 1.0000 mg | PEN_INJECTOR | SUBCUTANEOUS | 3 refills | Status: DC

## 2020-12-31 DIAGNOSIS — Z20822 Contact with and (suspected) exposure to covid-19: Secondary | ICD-10-CM | POA: Diagnosis not present

## 2020-12-31 MED ORDER — OZEMPIC (1 MG/DOSE) 4 MG/3ML ~~LOC~~ SOPN
1.0000 mg | PEN_INJECTOR | SUBCUTANEOUS | 5 refills | Status: DC
Start: 2020-12-31 — End: 2021-02-04

## 2021-01-04 DIAGNOSIS — Z20822 Contact with and (suspected) exposure to covid-19: Secondary | ICD-10-CM | POA: Diagnosis not present

## 2021-01-04 DIAGNOSIS — Z1152 Encounter for screening for COVID-19: Secondary | ICD-10-CM | POA: Diagnosis not present

## 2021-01-05 ENCOUNTER — Telehealth: Payer: Self-pay | Admitting: Pharmacist

## 2021-01-05 NOTE — Telephone Encounter (Signed)
See MyChart messages. Patient requiring sample while her pharmacies are working on stocking Ozempic   Medication Samples have been provided to the patient.  Drug name: Ozempic        Strength: 8 mg/3 mL        Qty: 1 pen  LOT: [id number]  Exp.Date: 07/02/2021  Dosing instructions: Inject 1 mg (37 clicks) weekly  The patient has been instructed regarding the correct time, dose, and frequency of taking this medication, including desired effects and most common side effects.   [name] 11:59 AM 01/05/2021

## 2021-01-21 DIAGNOSIS — D045 Carcinoma in situ of skin of trunk: Secondary | ICD-10-CM | POA: Diagnosis not present

## 2021-01-27 DIAGNOSIS — Z20822 Contact with and (suspected) exposure to covid-19: Secondary | ICD-10-CM | POA: Diagnosis not present

## 2021-02-01 ENCOUNTER — Other Ambulatory Visit: Payer: Self-pay

## 2021-02-01 ENCOUNTER — Other Ambulatory Visit (INDEPENDENT_AMBULATORY_CARE_PROVIDER_SITE_OTHER): Payer: Medicare Other

## 2021-02-01 DIAGNOSIS — E1169 Type 2 diabetes mellitus with other specified complication: Secondary | ICD-10-CM | POA: Diagnosis not present

## 2021-02-01 DIAGNOSIS — E782 Mixed hyperlipidemia: Secondary | ICD-10-CM

## 2021-02-01 LAB — COMPREHENSIVE METABOLIC PANEL
ALT: 36 U/L — ABNORMAL HIGH (ref 0–35)
AST: 25 U/L (ref 0–37)
Albumin: 4.3 g/dL (ref 3.5–5.2)
Alkaline Phosphatase: 80 U/L (ref 39–117)
BUN: 14 mg/dL (ref 6–23)
CO2: 29 mEq/L (ref 19–32)
Calcium: 9.2 mg/dL (ref 8.4–10.5)
Chloride: 99 mEq/L (ref 96–112)
Creatinine, Ser: 0.7 mg/dL (ref 0.40–1.20)
GFR: 84.1 mL/min (ref >60.00)
Glucose, Bld: 188 mg/dL — ABNORMAL HIGH (ref 70–99)
Potassium: 4 mEq/L (ref 3.5–5.1)
Sodium: 137 mEq/L (ref 135–145)
Total Bilirubin: 0.6 mg/dL (ref 0.2–1.2)
Total Protein: 6.9 g/dL (ref 6.0–8.3)

## 2021-02-01 LAB — HEMOGLOBIN A1C: Hgb A1c MFr Bld: 7.7 % — ABNORMAL HIGH (ref 4.6–6.5)

## 2021-02-02 DIAGNOSIS — H2513 Age-related nuclear cataract, bilateral: Secondary | ICD-10-CM | POA: Diagnosis not present

## 2021-02-02 LAB — HM DIABETES EYE EXAM

## 2021-02-04 ENCOUNTER — Other Ambulatory Visit: Payer: Self-pay

## 2021-02-04 ENCOUNTER — Telehealth: Payer: Self-pay | Admitting: Pharmacist

## 2021-02-04 ENCOUNTER — Ambulatory Visit (INDEPENDENT_AMBULATORY_CARE_PROVIDER_SITE_OTHER): Payer: Medicare Other | Admitting: Pharmacist

## 2021-02-04 ENCOUNTER — Ambulatory Visit: Payer: Medicare Other | Admitting: Pharmacist

## 2021-02-04 DIAGNOSIS — E1169 Type 2 diabetes mellitus with other specified complication: Secondary | ICD-10-CM

## 2021-02-04 DIAGNOSIS — E782 Mixed hyperlipidemia: Secondary | ICD-10-CM | POA: Diagnosis not present

## 2021-02-04 MED ORDER — TELMISARTAN 80 MG PO TABS: 80.0000 mg | ORAL_TABLET | Freq: Every day | ORAL | 3 refills | Status: DC

## 2021-02-04 MED ORDER — PREDNISONE 10 MG PO TABS: ORAL_TABLET | ORAL | 0 refills | Status: DC

## 2021-02-04 MED ORDER — PROMETHAZINE HCL 12.5 MG PO TABS: 12.5000 mg | ORAL_TABLET | Freq: Three times a day (TID) | ORAL | 0 refills | Status: DC | PRN

## 2021-02-04 MED ORDER — SEMAGLUTIDE (2 MG/DOSE) 8 MG/3ML ~~LOC~~ SOPN: 2.0000 mg | PEN_INJECTOR | SUBCUTANEOUS | 2 refills | Status: DC

## 2021-02-04 MED ORDER — LEVOFLOXACIN 500 MG PO TABS: 500.0000 mg | ORAL_TABLET | Freq: Every day | ORAL | 0 refills | Status: DC

## 2021-02-04 MED ORDER — OSELTAMIVIR PHOSPHATE 75 MG PO CAPS: 75.0000 mg | ORAL_CAPSULE | Freq: Two times a day (BID) | ORAL | 0 refills | Status: DC

## 2021-02-04 NOTE — Telephone Encounter (Signed)
Patient seen for Pharmacy visit today, she notes she is leaving on a cruise tomorrow and requests the prescriptions Dr. Derrel Nip typically gives before a cruise.

## 2021-02-04 NOTE — Addendum Note (Signed)
Addended by: Crecencio Mc on: 02/04/2021 10:18 AM   Modules accepted: Orders

## 2021-02-04 NOTE — Progress Notes (Signed)
Chief Complaint  Patient presents with   Diabetes    Emily Ryan is a 76 y.o. year old female who was referred for medication management by their primary care provider, Crecencio Mc, MD. They presented for a face to face visit today.      Subjective: Diabetes:  Current medications: metformin XR 1500 mg daily, Ozempic 1 mg (clicking up 37 clicks on 2 mg pen), glipizide 5 mg daily   Current glucose readings: fasting ~120s; 2 hour post prandial 120-160s  Patient reports hypoglycemic s/sx including dizziness, shakiness, sweating. Reports she treats with ~ 15 g CHO. Denies waiting 15 minutes and rechecking. Reports she probably isn't eating well because she is afraid of a low blood sugar.   Reports [facility] has been unable to ship her order of Ozempic yet due to back order.   Also requests telmisartan refill to [place].   Objective: Lab Results  Component Value Date   HGBA1C 7.7 (H) 02/01/2021    Lab Results  Component Value Date   CREATININE 0.70 02/01/2021   BUN 14 02/01/2021   NA 137 02/01/2021   K 4.0 02/01/2021   CL 99 02/01/2021   CO2 29 02/01/2021    Lab Results  Component Value Date   CHOL 141 10/30/2020   HDL 52.20 10/30/2020   LDLCALC 58 10/30/2020   LDLDIRECT 72.0 04/12/2018   TRIG 152.0 (H) 10/30/2020   CHOLHDL 3 10/30/2020    Medications Reviewed Today     Reviewed by [name], RPH-CPP (Pharmacist) on 02/04/21 at [phone]  Med List Status: <None>   Medication Order Taking? Sig Documenting Provider Last Dose Status Informant  amLODipine (NORVASC) 2.5 MG tablet [id number] No TAKE 1 TABLET(2.5 MG) BY MOUTH EVERY EVENING Crecencio Mc, MD Taking Active   Ascorbic Acid (VITAMIN C) 1000 MG tablet [id number] No Take 1,000 mg by mouth daily.  [provider] Taking Active Self  aspirin EC 81 MG tablet [id number] No Take 81 mg by mouth at bedtime. Swallow whole. [provider] Taking Active Self  Calcium  Carbonate-Vitamin D3 600-400 MG-UNIT TABS [id number] No Take 1 tablet by mouth daily. [provider] Taking Active Self  clobetasol cream (TEMOVATE) 0.05 % [id number] No Apply 1 application topically 2 (two) times daily as needed (psoriasis). [provider] Taking Active Self  escitalopram (LEXAPRO) 5 MG tablet [id number] No Take 1 tablet (5 mg total) by mouth every evening. Crecencio Mc, MD Taking Active   folic acid (FOLVITE) 1 MG tablet [id number] No Take 1 tablet (1 mg total) by mouth See admin instructions. Takes daily except on Saturday Crecencio Mc, MD Taking Active   glucose blood (FREESTYLE LITE) test strip [id number] No Use as instructed to test blood sugar twice daily Crecencio Mc, MD Taking Active Self  hydrochlorothiazide (HYDRODIURIL) 12.5 MG tablet [id number] No Take 1 tablet (12.5 mg total) by mouth daily. Crecencio Mc, MD Taking Active   loratadine (CLARITIN) 10 MG tablet [id number] No Take 1 tablet (10 mg total) by mouth daily. Crecencio Mc, MD Taking Active   metFORMIN (GLUCOPHAGE XR) 750 MG 24 hr tablet [id number] No Take 2 tablets (1,500 mg total) by mouth daily with breakfast. Crecencio Mc, MD Taking Active   methotrexate (RHEUMATREX) 2.5 MG tablet [id number] No Take 3 tablets (7.5 mg total) by mouth once a week. Caution:Chemotherapy. Protect from light. Crecencio Mc, MD Taking Active   montelukast (SINGULAIR) 10  MG tablet [id number] No Take 1 tablet (10 mg total) by mouth at bedtime. Crecencio Mc, MD Taking Active   Multiple Vitamin (MULTIVITAMIN) capsule [id number] No Take 1 capsule by mouth daily. [provider] Taking Active Self  omeprazole (PRILOSEC) 20 MG capsule [id number] No Take 1 capsule (20 mg total) by mouth daily. Crecencio Mc, MD Taking Active   Precision Thin Lancets MISC [id number] No Use as directed to check sugars twice daily Crecencio Mc, MD Taking Active Self  rosuvastatin (CRESTOR) 10 MG tablet [id number] No Take 1  tablet (10 mg total) by mouth daily. Crecencio Mc, MD Taking Active   Semaglutide, 2 MG/DOSE, 8 MG/3ML SOPN [id number]  Inject 2 mg as directed once a week. Crecencio Mc, MD  Active   telmisartan (MICARDIS) 80 MG tablet [id number]  Take 1 tablet (80 mg total) by mouth daily. Crecencio Mc, MD  Active   vitamin E 400 UNIT capsule [id number] No Take 400 Units by mouth daily. [provider] Taking Active Self            Assessment/Plan:   Diabetes: - Currently uncontrolled - Reviewed goal A1c, goal fasting, and goal 2 hour post prandial glucose - Reviewed dietary modifications including Rule of 15 - Discontinue glipizide due to low blood sugars, resulting in more carb/sugar intake to prevent lows.  - Reviewed Ozempic administration technique. Patient appears to be appropriately administering.  - Increase Ozempic to 2 mg weekly. Continue metformin XR 1500 mg daily - Discussed SGLT2 as next step to add if Ozempic dose increase does not result in normoglycemia. She verbalizes understanding.   Schedule follow up with PCP   Follow Up Plan: Office visit in ~ 6 weeks  [name], PharmD, Blue, CPP Clinical Pharmacist [employer] [facility] [phone]

## 2021-02-04 NOTE — Addendum Note (Signed)
Addended by: Crecencio Mc on: 02/04/2021 10:48 AM   Modules accepted: Orders

## 2021-02-04 NOTE — Patient Instructions (Signed)
Emily Ryan,   Increase Ozempic to 2 mg weekly. Continue metformin XR 1500 mg daily.   STOP GLIPIZIDE.   Check your blood sugars twice daily:  1) Fasting, first thing in the morning before breakfast and  2) 2 hours after your largest meal.   For a goal A1c of less than 7%, goal fasting readings are less than 130 and goal 2 hour after meal readings are less than 180.   Write down these readings and bring to your follow up with Dr. Derrel Nip.   Please schedule your yearly eye exam and have those results sent to our office.  Take care!  [name], PharmD

## 2021-02-04 NOTE — Patient Instructions (Signed)
Increase Ozempic to  2 mg weekly. Stop glipizide. Schedule follow up with PCP.   [name], PharmD

## 2021-02-04 NOTE — Progress Notes (Signed)
After check in, patient noted she is declining CCM services due to copay. Offered Office Visit instead. Patient amenable.

## 2021-03-04 ENCOUNTER — Ambulatory Visit (INDEPENDENT_AMBULATORY_CARE_PROVIDER_SITE_OTHER): Payer: Medicare Other | Admitting: Internal Medicine

## 2021-03-04 ENCOUNTER — Encounter: Payer: Self-pay | Admitting: Internal Medicine

## 2021-03-04 ENCOUNTER — Other Ambulatory Visit: Payer: Self-pay

## 2021-03-04 VITALS — BP 136/70 | HR 83 | Temp 98.3°F | Ht 59.0 in | Wt 131.2 lb

## 2021-03-04 DIAGNOSIS — E782 Mixed hyperlipidemia: Secondary | ICD-10-CM | POA: Diagnosis not present

## 2021-03-04 DIAGNOSIS — E1169 Type 2 diabetes mellitus with other specified complication: Secondary | ICD-10-CM | POA: Diagnosis not present

## 2021-03-04 MED ORDER — SEMAGLUTIDE-WEIGHT MANAGEMENT 2.4 MG/0.75ML ~~LOC~~ SOAJ: 2.4000 mg | SUBCUTANEOUS | 0 refills | Status: DC

## 2021-03-04 NOTE — Progress Notes (Signed)
? ?Subjective:  ?Patient ID: Emily Ryan, female    DOB: 29-Apr-1945  Age: 76 y.o. MRN: [id number] ? ?CC: The encounter diagnosis was DM type 2 with diabetic mixed hyperlipidemia ([place]). ? ? ?This visit occurred during the SARS-CoV-2 public health emergency.  Safety protocols were in place, including screening questions prior to the visit, additional usage of staff PPE, and extensive cleaning of exam room while observing appropriate contact time as indicated for disinfecting solutions.   ? ?HPI ?Emily Ryan presents for  ?Chief Complaint  ?Patient presents with  ? Follow-up  ?  Follow up on diabetes  ? ?1) T2DM:  has been taking 2 mg of ozempic for 4 weeks,  no nausea, but having loose stools more frequently .  There is a shortage of 2 mg pens locally,  she has been unable to fill her prescription anywhere in town, including [place] . POST PRANDIALS have been elevated to > 200 at dinner time and morning  fastings have been elevated.   She is exercising daily . She has tolerated glipizide in the past but has not been using it due to recurrent lows with use of 5 mg .  Discussed adding 2.5 mg at dinner only.  ?  ? ?Outpatient Medications Prior to Visit  ?Medication Sig Dispense Refill  ? amLODipine (NORVASC) 2.5 MG tablet TAKE 1 TABLET(2.5 MG) BY MOUTH EVERY EVENING 90 tablet 1  ? Ascorbic Acid (VITAMIN C) 1000 MG tablet Take 1,000 mg by mouth daily.     ? aspirin EC 81 MG tablet Take 81 mg by mouth at bedtime. Swallow whole.    ? Calcium Carbonate-Vitamin D3 600-400 MG-UNIT TABS Take 1 tablet by mouth daily.    ? clobetasol cream (TEMOVATE) 2.69 % Apply 1 application topically 2 (two) times daily as needed (psoriasis).    ? escitalopram (LEXAPRO) 5 MG tablet Take 1 tablet (5 mg total) by mouth every evening. 90 tablet 3  ? folic acid (FOLVITE) 1 MG tablet Take 1 tablet (1 mg total) by mouth See admin instructions. Takes daily except on Saturday 90 tablet 3  ? glucose blood (FREESTYLE LITE) test strip Use as  instructed to test blood sugar twice daily 200 each 3  ? hydrochlorothiazide (HYDRODIURIL) 12.5 MG tablet Take 1 tablet (12.5 mg total) by mouth daily. 90 tablet 3  ? loratadine (CLARITIN) 10 MG tablet Take 1 tablet (10 mg total) by mouth daily. 90 tablet 3  ? metFORMIN (GLUCOPHAGE XR) 750 MG 24 hr tablet Take 2 tablets (1,500 mg total) by mouth daily with breakfast. 180 tablet 3  ? methotrexate (RHEUMATREX) 2.5 MG tablet Take 3 tablets (7.5 mg total) by mouth once a week. Caution:Chemotherapy. Protect from light. 36 tablet 1  ? montelukast (SINGULAIR) 10 MG tablet Take 1 tablet (10 mg total) by mouth at bedtime. 90 tablet 3  ? Multiple Vitamin (MULTIVITAMIN) capsule Take 1 capsule by mouth daily.    ? omeprazole (PRILOSEC) 20 MG capsule Take 1 capsule (20 mg total) by mouth daily. 90 capsule 3  ? Precision Thin Lancets MISC Use as directed to check sugars twice daily 180 each 3  ? rosuvastatin (CRESTOR) 10 MG tablet Take 1 tablet (10 mg total) by mouth daily. 90 tablet 3  ? Semaglutide, 2 MG/DOSE, 8 MG/3ML SOPN Inject 2 mg as directed once a week. 3 mL 2  ? telmisartan (MICARDIS) 80 MG tablet Take 1 tablet (80 mg total) by mouth daily. 90 tablet 3  ?  vitamin E 400 UNIT capsule Take 400 Units by mouth daily.    ? levofloxacin (LEVAQUIN) 500 MG tablet Take 1 tablet (500 mg total) by mouth daily. (Patient not taking: Reported on 03/04/2021) 7 tablet 0  ? oseltamivir (TAMIFLU) 75 MG capsule Take 1 capsule (75 mg total) by mouth 2 (two) times daily. (Patient not taking: Reported on 03/04/2021) 14 capsule 0  ? predniSONE (DELTASONE) 10 MG tablet 6 tablets on Day 1 , then reduce by 1 tablet daily until gone (Patient not taking: Reported on 03/04/2021) 21 tablet 0  ? promethazine (PHENERGAN) 12.5 MG tablet Take 1 tablet (12.5 mg total) by mouth every 8 (eight) hours as needed for nausea or vomiting. (Patient not taking: Reported on 03/04/2021) 20 tablet 0  ? ?No facility-administered medications prior to visit.  ? ? ?Review of  Systems; ? ?Patient denies headache, fevers, malaise, unintentional weight loss, skin rash, eye pain, sinus congestion and sinus pain, sore throat, dysphagia,  hemoptysis , cough, dyspnea, wheezing, chest pain, palpitations, orthopnea, edema, abdominal pain, nausea, melena, diarrhea, constipation, flank pain, dysuria, hematuria, urinary  Frequency, nocturia, numbness, tingling, seizures,  Focal weakness, Loss of consciousness,  Tremor, insomnia, depression, anxiety, and suicidal ideation.   ? ? ? ?Objective:  ?BP 136/70 (BP Location: Left Arm, Patient Position: Sitting, Cuff Size: Normal)   Pulse 83   Temp 98.3 ?F (36.8 ?C) (Oral)   Ht 4\' 11"  (1.499 m)   Wt 131 lb 3.2 oz (59.5 kg)   SpO2 96%   BMI 26.50 kg/m?  ? ?BP Readings from Last 3 Encounters:  ?03/04/21 136/70  ?11/03/20 (!) 146/70  ?11/02/20 131/75  ? ? ?Wt Readings from Last 3 Encounters:  ?03/04/21 131 lb 3.2 oz (59.5 kg)  ?11/03/20 132 lb (59.9 kg)  ?11/02/20 130 lb (59 kg)  ? ? ?General appearance: alert, cooperative and appears stated age ?Ears: normal TM's and external ear canals both ears ?Throat: lips, mucosa, and tongue normal; teeth and gums normal ?Neck: no adenopathy, no carotid bruit, supple, symmetrical, trachea midline and thyroid not enlarged, symmetric, no tenderness/mass/nodules ?Back: symmetric, no curvature. ROM normal. No CVA tenderness. ?Lungs: clear to auscultation bilaterally ?Heart: regular rate and rhythm, S1, S2 normal, no murmur, click, rub or gallop ?Abdomen: soft, non-tender; bowel sounds normal; no masses,  no organomegaly ?Pulses: 2+ and symmetric ?Skin: Skin color, texture, turgor normal. No rashes or lesions ?Lymph nodes: Cervical, supraclavicular, and axillary nodes normal. ? ?Lab Results  ?Component Value Date  ? HGBA1C 7.7 (H) 02/01/2021  ? HGBA1C 7.5 (H) 10/30/2020  ? HGBA1C 8.0 (H) 07/29/2020  ? ? ?Lab Results  ?Component Value Date  ? CREATININE 0.70 02/01/2021  ? CREATININE 0.68 10/30/2020  ? CREATININE 0.61  07/29/2020  ? ? ?Lab Results  ?Component Value Date  ? WBC 6.0 07/29/2020  ? HGB 13.6 07/29/2020  ? HCT 40.2 07/29/2020  ? PLT 290.0 07/29/2020  ? GLUCOSE 188 (H) 02/01/2021  ? CHOL 141 10/30/2020  ? TRIG 152.0 (H) 10/30/2020  ? HDL 52.20 10/30/2020  ? LDLDIRECT 72.0 04/12/2018  ? [place] 58 10/30/2020  ? ALT 36 (H) 02/01/2021  ? AST 25 02/01/2021  ? NA 137 02/01/2021  ? K 4.0 02/01/2021  ? CL 99 02/01/2021  ? CREATININE 0.70 02/01/2021  ? BUN 14 02/01/2021  ? CO2 29 02/01/2021  ? TSH 3.11 01/14/2020  ? HGBA1C 7.7 (H) 02/01/2021  ? MICROALBUR 3.6 (H) 07/29/2020  ? ? ?No results found. ? ?Assessment & Plan:  ? ?  Problem List Items Addressed This Visit   ? ? DM type 2 with diabetic mixed hyperlipidemia ([place]) - Primary  ?  Evening blood sugars and morning fastings have been elevated on 2 mg Ozempic dose.  Advised to add 2.5 mg glipizide dose  At dinnertime.continue metformin. Marland Kitchen  LDL is at goal < 70 with Crestor 10 mg and LFT's are normal ? ? ?Lab Results  ?Component Value Date  ? HGBA1C 7.7 (H) 02/01/2021  ? ? ?Lab Results  ?Component Value Date  ? CHOL 141 10/30/2020  ? HDL 52.20 10/30/2020  ? [place] 58 10/30/2020  ? LDLDIRECT 72.0 04/12/2018  ? TRIG 152.0 (H) 10/30/2020  ? CHOLHDL 3 10/30/2020  ? ?Lab Results  ?Component Value Date  ? ALT 36 (H) 02/01/2021  ? AST 25 02/01/2021  ? ALKPHOS 80 02/01/2021  ? BILITOT 0.6 02/01/2021  ? ? ?Lab Results  ?Component Value Date  ? MICROALBUR 3.6 (H) 07/29/2020  ? MICROALBUR 1.5 10/10/2019  ? ? ? ?  ?  ? Relevant Orders  ? Lipid panel  ? Comprehensive metabolic panel  ? Hemoglobin A1c  ? Microalbumin / creatinine urine ratio  ? ? ?I spent 30 minutes dedicated to the care of this patient on the date of this encounter to include pre-visit review of patient's medical history,  most recent imaging studies, Face-to-face time with the patient , and post visit ordering of testing and therapeutics.   ? ?Follow-up: No follow-ups on file. ? ? ?Crecencio Mc, MD ?

## 2021-03-04 NOTE — Patient Instructions (Addendum)
I want you to add 2.5 mg glipizide before your dinner meal   ONLY  ? ?We are giving you a sample of the semaglutide pen to tide you over  because of the shortage.   ?Track your " before and after" breakfast sugars  especially on the MWF exercises day  ? ? ?On the  other days,  check before and after dinner with glipizide  start  ? ? ?See Catie on march 14 to evaluate sugars after adding  ? ? ? Labs due may 2.   See me in late may or Can see me in early June  ? ?Increase claritin to twice daily for the pollen season  ?

## 2021-03-06 NOTE — Assessment & Plan Note (Signed)
Evening blood sugars and morning fastings have been elevated on 2 mg Ozempic dose.  Advised to add 2.5 mg glipizide dose  At dinnertime.continue metformin. Emily Ryan  LDL is at goal < 70 with Crestor 10 mg and LFT's are normal ? ? ?Lab Results  ?Component Value Date  ? HGBA1C 7.7 (H) 02/01/2021  ? ? ?Lab Results  ?Component Value Date  ? CHOL 141 10/30/2020  ? HDL 52.20 10/30/2020  ? [place] 58 10/30/2020  ? LDLDIRECT 72.0 04/12/2018  ? TRIG 152.0 (H) 10/30/2020  ? CHOLHDL 3 10/30/2020  ? ?Lab Results  ?Component Value Date  ? ALT 36 (H) 02/01/2021  ? AST 25 02/01/2021  ? ALKPHOS 80 02/01/2021  ? BILITOT 0.6 02/01/2021  ? ? ?Lab Results  ?Component Value Date  ? MICROALBUR 3.6 (H) 07/29/2020  ? MICROALBUR 1.5 10/10/2019  ? ? ? ?

## 2021-03-08 ENCOUNTER — Telehealth: Payer: Self-pay | Admitting: Pharmacist

## 2021-03-08 NOTE — Telephone Encounter (Signed)
Please call and r/s our follow up for this week, 30 minutes ?

## 2021-03-12 ENCOUNTER — Ambulatory Visit (INDEPENDENT_AMBULATORY_CARE_PROVIDER_SITE_OTHER): Payer: Medicare Other | Admitting: Pharmacist

## 2021-03-12 ENCOUNTER — Ambulatory Visit: Payer: Medicare Other | Admitting: Pharmacist

## 2021-03-12 ENCOUNTER — Other Ambulatory Visit: Payer: Self-pay

## 2021-03-12 DIAGNOSIS — E1169 Type 2 diabetes mellitus with other specified complication: Secondary | ICD-10-CM | POA: Diagnosis not present

## 2021-03-12 DIAGNOSIS — E782 Mixed hyperlipidemia: Secondary | ICD-10-CM | POA: Diagnosis not present

## 2021-03-12 MED ORDER — EMPAGLIFLOZIN 10 MG PO TABS: 10.0000 mg | ORAL_TABLET | Freq: Every day | ORAL | 0 refills | Status: DC

## 2021-03-12 NOTE — Progress Notes (Signed)
? ?   ? ?Chief Complaint  ?Patient presents with  ? Diabetes  ? ? ?Emily Ryan is a 76 y.o. year old female who was referred for medication management by their primary care provider, Crecencio Mc, MD. They presented for a face to face visit today. ?  ?  ? ?Subjective: ?Diabetes: ? ?Current medications: metformin XR 1500 mg daily, Ozempic 2 mg weekly - finally has found a [facility] (only preferred option besides [facility] through [employer]) that is consistently stocking this dose; glipizide XL 2.5 mg with supper  ?Medications tried in the past:  ? ?Current glucose readings: fastings 130-140s; 2 hour post prandial: 180-190s ? ?Denies intolerability with Ozempic 2 mg weekly dose. Denies hypoglycemia with glipizide XL. Reports she did have a reading of 85 where she felt a bit "off", but she had eaten a small dinner.  ? ?No history of Jardiance.  ?  ? ?Objective: ?Lab Results  ?Component Value Date  ? HGBA1C 7.7 (H) 02/01/2021  ? ? ?Lab Results  ?Component Value Date  ? CREATININE 0.70 02/01/2021  ? BUN 14 02/01/2021  ? NA 137 02/01/2021  ? K 4.0 02/01/2021  ? CL 99 02/01/2021  ? CO2 29 02/01/2021  ? ? ?Lab Results  ?Component Value Date  ? CHOL 141 10/30/2020  ? HDL 52.20 10/30/2020  ? [place] 58 10/30/2020  ? LDLDIRECT 72.0 04/12/2018  ? TRIG 152.0 (H) 10/30/2020  ? CHOLHDL 3 10/30/2020  ? ? ?Medications Reviewed Today   ? ? Reviewed by [name], RPH-CPP (Pharmacist) on 03/12/21 at 1431  Med List Status: <None>  ? ?Medication Order Taking? Sig Documenting Provider Last Dose Status Informant  ?amLODipine (NORVASC) 2.5 MG tablet [id number]  TAKE 1 TABLET(2.5 MG) BY MOUTH EVERY EVENING Crecencio Mc, MD  Active   ?Ascorbic Acid (VITAMIN C) 1000 MG tablet [id number]  Take 1,000 mg by mouth daily.  [provider]  Active Self  ?aspirin EC 81 MG tablet [id number]  Take 81 mg by mouth at bedtime. Swallow whole. [provider]  Active Self  ?Calcium Carbonate-Vitamin D3 600-400  MG-UNIT TABS [id number]  Take 1 tablet by mouth daily. [provider]  Active Self  ?clobetasol cream (TEMOVATE) 0.05 % [id number]  Apply 1 application topically 2 (two) times daily as needed (psoriasis). [provider]  Active Self  ?escitalopram (LEXAPRO) 5 MG tablet [id number]  Take 1 tablet (5 mg total) by mouth every evening. Crecencio Mc, MD  Active   ?folic acid (FOLVITE) 1 MG tablet [id number]  Take 1 tablet (1 mg total) by mouth See admin instructions. Takes daily except on Saturday Crecencio Mc, MD  Active   ?glipiZIDE (GLIPIZIDE XL) 2.5 MG 24 hr tablet [id number] Yes Take 2.5 mg by mouth daily with breakfast. [provider] Taking Active   ?glucose blood (FREESTYLE LITE) test strip [id number]  Use as instructed to test blood sugar twice daily Crecencio Mc, MD  Active Self  ?hydrochlorothiazide (HYDRODIURIL) 12.5 MG tablet [id number]  Take 1 tablet (12.5 mg total) by mouth daily. Crecencio Mc, MD  Active   ?loratadine (CLARITIN) 10 MG tablet [id number]  Take 1 tablet (10 mg total) by mouth daily. Crecencio Mc, MD  Active   ?metFORMIN (GLUCOPHAGE XR) 750 MG 24 hr tablet [id number] Yes Take 2 tablets (1,500 mg total) by mouth daily with breakfast. Crecencio Mc, MD Taking Active   ?methotrexate (RHEUMATREX) 2.5 MG tablet [id number]  Take 3 tablets (  7.5 mg total) by mouth once a week. Caution:Chemotherapy. Protect from light. Crecencio Mc, MD  Active   ?montelukast (SINGULAIR) 10 MG tablet [id number]  Take 1 tablet (10 mg total) by mouth at bedtime. Crecencio Mc, MD  Active   ?Multiple Vitamin (MULTIVITAMIN) capsule [id number]  Take 1 capsule by mouth daily. [provider]  Active Self  ?omeprazole (PRILOSEC) 20 MG capsule [id number]  Take 1 capsule (20 mg total) by mouth daily. Crecencio Mc, MD  Active   ?Precision Thin Lancets MISC [id number]  Use as directed to check sugars twice daily Crecencio Mc, MD  Active Self  ?rosuvastatin (CRESTOR) 10 MG tablet  [id number]  Take 1 tablet (10 mg total) by mouth daily. Crecencio Mc, MD  Active   ?Semaglutide, 2 MG/DOSE, 8 MG/3ML SOPN [id number] Yes Inject 2 mg as directed once a week. Crecencio Mc, MD Taking Active   ?telmisartan (MICARDIS) 80 MG tablet [id number]  Take 1 tablet (80 mg total) by mouth daily. Crecencio Mc, MD  Active   ?vitamin E 400 UNIT capsule [id number]  Take 400 Units by mouth daily. [provider]  Active Self  ? ?  ?  ? ?  ? ? ?Assessment/Plan:  ? ?Diabetes: ?- Currently uncontrolled ?- Reviewed goal A1c, goal fasting, and goal 2 hour post prandial glucose ?- Given history of hypoglycemia, discussed SGLT2. Counseled on SGLT2, including mechanism of action, side effects, and benefits. Discussed potential side effects of dehydration, genitourinary infections. Encouraged adequate hydration and genital hygiene. Advised on sick day rules (if a day with significantly reduced oral intake, serious vomiting, or diarrhea, hold SGLT2). Patient verbalized understanding.  ?- Start Jardiance 10 mg daily. Continue metformin XR 1500 mg daily. HOLD glipizide.  ?- Continue checking blood glucose at least twice daily, fasting and 2 hour post prandial. Patient will send MyChart message to provider in 4 weeks, will let her know how she is tolerating and what her average fasting and 2 hour post prandial readings are.  ? ? ?Follow Up Plan: follow up with PCP as scheduled ? ?[name], PharmD, BCACP, CPP ?Clinical Pharmacist ?Therapist, music at [facility] ?[phone] ? ? ? ?

## 2021-03-12 NOTE — Patient Instructions (Signed)
[name],  ? ?Keep up the great work! ? ?Start Jardiance 10 mg daily. Take this medication in the morning, as it can cause more frequent urination with more sugar in the urine. It may worsen risk for dehydration or genital infections. Focus on staying well hydrated and using good genital hygiene. Stop the medication and call our office if you develop any symptoms of genital infections, such as burning, itching, or pain while urinating or itching with redness that could be a yeast infection. If you have a day that you are vomiting or having diarrhea and you are very dehydrated, please hold this medication until you feel better.  ? ?Continue Ozempic 2 mg weekly and metformin XR 1500 mg daily  ? ?Please send Dr. Derrel Nip a MyChart message in 4 weeks to let her know how your blood sugars are doing. You can say something like "my fasting readings are generally ** and my 2 hour after meal readings are generally **". Let her know if you aren't tolerating Jardiance. If you are tolerating it and your sugars are not quite at goal, you two can discuss increasing the dose.  ? ?Check your blood sugars twice daily:  ?1) Fasting, first thing in the morning before breakfast and  ?2) 2 hours after your largest meal.  ? ?For a goal A1c of less than 7%, goal fasting readings are less than 130 and goal 2 hour after meal readings are less than 180.  ? ?Take care! ? ?[name], PharmD ?

## 2021-03-12 NOTE — Progress Notes (Signed)
Error. Appointment scheduled as office viist ?

## 2021-03-16 ENCOUNTER — Ambulatory Visit: Payer: Medicare Other

## 2021-03-16 DIAGNOSIS — Z20822 Contact with and (suspected) exposure to covid-19: Secondary | ICD-10-CM | POA: Diagnosis not present

## 2021-03-18 ENCOUNTER — Encounter: Payer: Self-pay | Admitting: Internal Medicine

## 2021-03-18 MED ORDER — EMPAGLIFLOZIN 10 MG PO TABS: 10.0000 mg | ORAL_TABLET | Freq: Every day | ORAL | 3 refills | Status: DC

## 2021-03-26 DIAGNOSIS — Z20822 Contact with and (suspected) exposure to covid-19: Secondary | ICD-10-CM | POA: Diagnosis not present

## 2021-03-31 DIAGNOSIS — Z20822 Contact with and (suspected) exposure to covid-19: Secondary | ICD-10-CM | POA: Diagnosis not present

## 2021-04-08 DIAGNOSIS — Z20822 Contact with and (suspected) exposure to covid-19: Secondary | ICD-10-CM | POA: Diagnosis not present

## 2021-04-21 DIAGNOSIS — N760 Acute vaginitis: Secondary | ICD-10-CM | POA: Diagnosis not present

## 2021-04-29 ENCOUNTER — Other Ambulatory Visit: Payer: Self-pay

## 2021-04-29 ENCOUNTER — Encounter: Payer: Self-pay | Admitting: Internal Medicine

## 2021-04-29 DIAGNOSIS — E1169 Type 2 diabetes mellitus with other specified complication: Secondary | ICD-10-CM

## 2021-04-29 MED ORDER — SEMAGLUTIDE (2 MG/DOSE) 8 MG/3ML ~~LOC~~ SOPN: 2.0000 mg | PEN_INJECTOR | SUBCUTANEOUS | 1 refills | Status: DC

## 2021-05-03 DIAGNOSIS — Z20822 Contact with and (suspected) exposure to covid-19: Secondary | ICD-10-CM | POA: Diagnosis not present

## 2021-05-04 ENCOUNTER — Ambulatory Visit: Payer: Medicare Other | Admitting: Internal Medicine

## 2021-05-04 ENCOUNTER — Encounter: Payer: Self-pay | Admitting: Internal Medicine

## 2021-05-04 DIAGNOSIS — E1169 Type 2 diabetes mellitus with other specified complication: Secondary | ICD-10-CM

## 2021-05-05 ENCOUNTER — Other Ambulatory Visit: Payer: Medicare Other

## 2021-05-06 MED ORDER — TELMISARTAN 80 MG PO TABS: 80.0000 mg | ORAL_TABLET | Freq: Every day | ORAL | 3 refills | Status: DC

## 2021-05-06 MED ORDER — FREESTYLE LITE TEST VI STRP: ORAL_STRIP | 3 refills | Status: DC

## 2021-05-06 MED ORDER — AMLODIPINE BESYLATE 2.5 MG PO TABS
ORAL_TABLET | ORAL | 3 refills | Status: DC
Start: 2021-05-06 — End: 2021-12-16

## 2021-05-27 DIAGNOSIS — L400 Psoriasis vulgaris: Secondary | ICD-10-CM | POA: Diagnosis not present

## 2021-05-27 DIAGNOSIS — D2272 Melanocytic nevi of left lower limb, including hip: Secondary | ICD-10-CM | POA: Diagnosis not present

## 2021-05-27 DIAGNOSIS — D2262 Melanocytic nevi of left upper limb, including shoulder: Secondary | ICD-10-CM | POA: Diagnosis not present

## 2021-05-27 DIAGNOSIS — D2271 Melanocytic nevi of right lower limb, including hip: Secondary | ICD-10-CM | POA: Diagnosis not present

## 2021-05-27 DIAGNOSIS — L821 Other seborrheic keratosis: Secondary | ICD-10-CM | POA: Diagnosis not present

## 2021-05-27 DIAGNOSIS — D2261 Melanocytic nevi of right upper limb, including shoulder: Secondary | ICD-10-CM | POA: Diagnosis not present

## 2021-06-14 ENCOUNTER — Other Ambulatory Visit (INDEPENDENT_AMBULATORY_CARE_PROVIDER_SITE_OTHER): Payer: Medicare Other

## 2021-06-14 DIAGNOSIS — E1169 Type 2 diabetes mellitus with other specified complication: Secondary | ICD-10-CM | POA: Diagnosis not present

## 2021-06-14 DIAGNOSIS — E782 Mixed hyperlipidemia: Secondary | ICD-10-CM | POA: Diagnosis not present

## 2021-06-14 LAB — LIPID PANEL
Cholesterol: 153 mg/dL (ref 0–200)
HDL: 57.5 mg/dL (ref >39.00)
LDL Cholesterol: 76 mg/dL (ref 0–99)
NonHDL: 95.45
Total CHOL/HDL Ratio: 3
Triglycerides: 96 mg/dL (ref 0.0–149.0)
VLDL: 19.2 mg/dL (ref 0.0–40.0)

## 2021-06-14 LAB — COMPREHENSIVE METABOLIC PANEL
ALT: 24 U/L (ref 0–35)
AST: 20 U/L (ref 0–37)
Albumin: 4 g/dL (ref 3.5–5.2)
Alkaline Phosphatase: 67 U/L (ref 39–117)
BUN: 16 mg/dL (ref 6–23)
CO2: 28 mEq/L (ref 19–32)
Calcium: 9.1 mg/dL (ref 8.4–10.5)
Chloride: 102 mEq/L (ref 96–112)
Creatinine, Ser: 0.63 mg/dL (ref 0.40–1.20)
GFR: 86.05 mL/min (ref >60.00)
Glucose, Bld: 126 mg/dL — ABNORMAL HIGH (ref 70–99)
Potassium: 4 mEq/L (ref 3.5–5.1)
Sodium: 139 mEq/L (ref 135–145)
Total Bilirubin: 0.7 mg/dL (ref 0.2–1.2)
Total Protein: 6.6 g/dL (ref 6.0–8.3)

## 2021-06-14 LAB — MICROALBUMIN / CREATININE URINE RATIO
Creatinine,U: 104.3 mg/dL
Microalb Creat Ratio: 1.4 mg/g (ref 0.0–30.0)
Microalb, Ur: 1.5 mg/dL (ref 0.0–1.9)

## 2021-06-14 LAB — HEMOGLOBIN A1C: Hgb A1c MFr Bld: 6.5 % (ref 4.6–6.5)

## 2021-06-16 ENCOUNTER — Encounter: Payer: Self-pay | Admitting: Internal Medicine

## 2021-06-16 ENCOUNTER — Ambulatory Visit (INDEPENDENT_AMBULATORY_CARE_PROVIDER_SITE_OTHER): Payer: Medicare Other | Admitting: Internal Medicine

## 2021-06-16 VITALS — BP 136/60 | HR 75 | Temp 98.1°F | Ht 59.0 in | Wt 121.6 lb

## 2021-06-16 DIAGNOSIS — E1169 Type 2 diabetes mellitus with other specified complication: Secondary | ICD-10-CM

## 2021-06-16 DIAGNOSIS — E782 Mixed hyperlipidemia: Secondary | ICD-10-CM

## 2021-06-16 DIAGNOSIS — E785 Hyperlipidemia, unspecified: Secondary | ICD-10-CM

## 2021-06-16 DIAGNOSIS — I10 Essential (primary) hypertension: Secondary | ICD-10-CM | POA: Diagnosis not present

## 2021-06-16 DIAGNOSIS — I700 Atherosclerosis of aorta: Secondary | ICD-10-CM | POA: Diagnosis not present

## 2021-06-16 MED ORDER — LORATADINE 10 MG PO TABS: 10.0000 mg | ORAL_TABLET | Freq: Every day | ORAL | 3 refills | Status: DC

## 2021-06-16 MED ORDER — ESCITALOPRAM OXALATE 5 MG PO TABS: 5.0000 mg | ORAL_TABLET | Freq: Every evening | ORAL | 3 refills | Status: DC

## 2021-06-16 MED ORDER — TETANUS-DIPHTH-ACELL PERTUSSIS 5-2.5-18.5 LF-MCG/0.5 IM SUSY: 0.5000 mL | PREFILLED_SYRINGE | Freq: Once | INTRAMUSCULAR | 0 refills | Status: AC

## 2021-06-16 MED ORDER — DOXYCYCLINE HYCLATE 100 MG PO TABS: 100.0000 mg | ORAL_TABLET | Freq: Two times a day (BID) | ORAL | 0 refills | Status: DC

## 2021-06-16 MED ORDER — OMEPRAZOLE 20 MG PO CPDR: 20.0000 mg | DELAYED_RELEASE_CAPSULE | Freq: Every day | ORAL | 3 refills | Status: DC

## 2021-06-16 MED ORDER — ROSUVASTATIN CALCIUM 10 MG PO TABS: 10.0000 mg | ORAL_TABLET | Freq: Every day | ORAL | 3 refills | Status: DC

## 2021-06-16 NOTE — Assessment & Plan Note (Signed)
Well controlled on current regimen. Renal function stable, no changes today.  Lab Results  Component Value Date   CREATININE 0.63 06/14/2021   Lab Results  Component Value Date   NA 139 06/14/2021   K 4.0 06/14/2021   CL 102 06/14/2021   CO2 28 06/14/2021

## 2021-06-16 NOTE — Patient Instructions (Addendum)
You look fantastic !!   No medication changes are needed.  Follow up in 6 months   Look up the origin  of the coupe glass !  ( I think it's related to a woman's breast)   Panna Cotta (look this up, lovely [place] version of creme brulee/custard )   [facility] Spotted Fever  can present with fever and a headache, NOT a rash;  the occurrence of these symptoms during spring/summer/fall in the context of recent tick exposure is RMSF until proven otherwise and should be treated immediately with doxycycline.  I have sent this rx to your local  pharmacy to use if you develop these symptoms.  Let me know if this occurs so we can set you up for the appropriate testing .

## 2021-06-16 NOTE — Progress Notes (Signed)
Subjective:  Patient ID: Emily Ryan, female    DOB: 1945/11/26  Age: 76 y.o. MRN: [id number]  CC: The primary encounter diagnosis was DM type 2 with diabetic mixed hyperlipidemia ([place]). Diagnoses of Mixed hyperlipidemia, Hyperlipidemia associated with type 2 diabetes mellitus ([place]), White coat syndrome with diagnosis of hypertension, and Atherosclerosis of aorta ([place]) were also pertinent to this visit.   HPI Emily Ryan presents for  Chief Complaint  Patient presents with   Follow-up    Follow up on diabetes   1) T2DM:  she  feels generally well, is exercising several times per week and checking blood sugars once daily at variable times.  BS have been under 130 fasting and < 150 post prandially.  Denies any recent hypoglyemic events.  Taking Ozempic and [place]   as directed. Following a carbohydrate modified diet 6 days per week. Denies numbness, burning and tingling of extremities. Appetite is good.   Weight has stabilized after a net loss of 11 lbs.  2) Reviewed findings of prior CT scan today..  Patient is tolerating  statin therapy        Outpatient Medications Prior to Visit  Medication Sig Dispense Refill   amLODipine (NORVASC) 2.5 MG tablet TAKE 1 TABLET(2.5 MG) BY MOUTH EVERY EVENING 90 tablet 3   Ascorbic Acid (VITAMIN C) 1000 MG tablet Take 1,000 mg by mouth daily.      aspirin EC 81 MG tablet Take 81 mg by mouth at bedtime. Swallow whole.     Calcium Carbonate-Vitamin D3 600-400 MG-UNIT TABS Take 1 tablet by mouth daily.     clobetasol cream (TEMOVATE) 8.54 % Apply 1 application topically 2 (two) times daily as needed (psoriasis).     empagliflozin (JARDIANCE) 10 MG TABS tablet Take 1 tablet (10 mg total) by mouth daily before breakfast. 90 tablet 3   folic acid (FOLVITE) 1 MG tablet Take 1 tablet (1 mg total) by mouth See admin instructions. Takes daily except on Saturday 90 tablet 3   glucose blood (FREESTYLE LITE) test strip Use as instructed to test blood  sugar once daily 100 each 3   hydrochlorothiazide (HYDRODIURIL) 12.5 MG tablet Take 1 tablet (12.5 mg total) by mouth daily. 90 tablet 3   metFORMIN (GLUCOPHAGE XR) 750 MG 24 hr tablet Take 2 tablets (1,500 mg total) by mouth daily with breakfast. 180 tablet 3   methotrexate (RHEUMATREX) 2.5 MG tablet Take 3 tablets (7.5 mg total) by mouth once a week. Caution:Chemotherapy. Protect from light. 36 tablet 1   montelukast (SINGULAIR) 10 MG tablet Take 1 tablet (10 mg total) by mouth at bedtime. 90 tablet 3   Multiple Vitamin (MULTIVITAMIN) capsule Take 1 capsule by mouth daily.     Precision Thin Lancets MISC Use as directed to check sugars twice daily 180 each 3   Semaglutide, 2 MG/DOSE, 8 MG/3ML SOPN Inject 2 mg as directed once a week. 9 mL 1   telmisartan (MICARDIS) 80 MG tablet Take 1 tablet (80 mg total) by mouth daily. 90 tablet 3   vitamin E 400 UNIT capsule Take 400 Units by mouth daily.     escitalopram (LEXAPRO) 5 MG tablet Take 1 tablet (5 mg total) by mouth every evening. 90 tablet 3   loratadine (CLARITIN) 10 MG tablet Take 1 tablet (10 mg total) by mouth daily. 90 tablet 3   omeprazole (PRILOSEC) 20 MG capsule Take 1 capsule (20 mg total) by mouth daily. 90 capsule 3   rosuvastatin (CRESTOR)  10 MG tablet Take 1 tablet (10 mg total) by mouth daily. 90 tablet 3   No facility-administered medications prior to visit.    Review of Systems;  Patient denies headache, fevers, malaise, unintentional weight loss, skin rash, eye pain, sinus congestion and sinus pain, sore throat, dysphagia,  hemoptysis , cough, dyspnea, wheezing, chest pain, palpitations, orthopnea, edema, abdominal pain, nausea, melena, diarrhea, constipation, flank pain, dysuria, hematuria, urinary  Frequency, nocturia, numbness, tingling, seizures,  Focal weakness, Loss of consciousness,  Tremor, insomnia, depression, anxiety, and suicidal ideation.      Objective:  BP 136/60 (BP Location: Left Arm, Patient Position:  Sitting, Cuff Size: Normal)   Pulse 75   Temp 98.1 F (36.7 C) (Oral)   Ht '4\' 11"'$  (1.499 m)   Wt 121 lb 9.6 oz (55.2 kg)   SpO2 96%   BMI 24.56 kg/m   BP Readings from Last 3 Encounters:  06/16/21 136/60  03/04/21 136/70  11/03/20 (!) 146/70    Wt Readings from Last 3 Encounters:  06/16/21 121 lb 9.6 oz (55.2 kg)  03/04/21 131 lb 3.2 oz (59.5 kg)  11/03/20 132 lb (59.9 kg)    General appearance: alert, cooperative and appears stated age Ears: normal TM's and external ear canals both ears Throat: lips, mucosa, and tongue normal; teeth and gums normal Neck: no adenopathy, no carotid bruit, supple, symmetrical, trachea midline and thyroid not enlarged, symmetric, no tenderness/mass/nodules Back: symmetric, no curvature. ROM normal. No CVA tenderness. Lungs: clear to auscultation bilaterally Heart: regular rate and rhythm, S1, S2 normal, no murmur, click, rub or gallop Abdomen: soft, non-tender; bowel sounds normal; no masses,  no organomegaly Pulses: 2+ and symmetric Skin: Skin color, texture, turgor normal. No rashes or lesions Lymph nodes: Cervical, supraclavicular, and axillary nodes normal.  Lab Results  Component Value Date   HGBA1C 6.5 06/14/2021   HGBA1C 7.7 (H) 02/01/2021   HGBA1C 7.5 (H) 10/30/2020    Lab Results  Component Value Date   CREATININE 0.63 06/14/2021   CREATININE 0.70 02/01/2021   CREATININE 0.68 10/30/2020    Lab Results  Component Value Date   WBC 6.0 07/29/2020   HGB 13.6 07/29/2020   HCT 40.2 07/29/2020   PLT 290.0 07/29/2020   GLUCOSE 126 (H) 06/14/2021   CHOL 153 06/14/2021   TRIG 96.0 06/14/2021   HDL 57.50 06/14/2021   LDLDIRECT 72.0 04/12/2018   LDLCALC 76 06/14/2021   ALT 24 06/14/2021   AST 20 06/14/2021   NA 139 06/14/2021   K 4.0 06/14/2021   CL 102 06/14/2021   CREATININE 0.63 06/14/2021   BUN 16 06/14/2021   CO2 28 06/14/2021   TSH 3.11 01/14/2020   HGBA1C 6.5 06/14/2021   MICROALBUR 1.5 06/14/2021    No  results found.  Assessment & Plan:   Problem List Items Addressed This Visit     White coat syndrome with diagnosis of hypertension    Well controlled on current regimen. Renal function stable, no changes today.  Lab Results  Component Value Date   CREATININE 0.63 06/14/2021   Lab Results  Component Value Date   NA 139 06/14/2021   K 4.0 06/14/2021   CL 102 06/14/2021   CO2 28 06/14/2021         Relevant Medications   rosuvastatin (CRESTOR) 10 MG tablet   Hyperlipidemia   Relevant Medications   rosuvastatin (CRESTOR) 10 MG tablet   Other Relevant Orders   Lipid panel   LDL cholesterol, direct  DM type 2 with diabetic mixed hyperlipidemia ([place]) - Primary    Currently well-controlled on current medications .  hemoglobin A1c is at goal of less than 7.0 . Patient is reminded to schedule an annual eye exam and foot exam is normal today. Patient has no microalbuminuria. Patient is tolerating statin therapy for CAD risk reduction and on ACE/ARB for renal protection and hypertension   Lab Results  Component Value Date   HGBA1C 6.5 06/14/2021   Lab Results  Component Value Date   MICROALBUR 1.5 06/14/2021   MICROALBUR 3.6 (H) 07/29/2020    Lab Results  Component Value Date   CHOL 153 06/14/2021   HDL 57.50 06/14/2021   LDLCALC 76 06/14/2021   LDLDIRECT 72.0 04/12/2018   TRIG 96.0 06/14/2021   CHOLHDL 3 06/14/2021         Relevant Medications   rosuvastatin (CRESTOR) 10 MG tablet   Other Relevant Orders   Comprehensive metabolic panel   Hemoglobin A1c   Atherosclerosis of aorta ([facility])    Reviewed findings of prior CT scan today..  Patient is taking simvastatin and LDL is well under < 100  Lab Results  Component Value Date   CHOL 153 06/14/2021   HDL 57.50 06/14/2021   LDLCALC 76 06/14/2021   LDLDIRECT 72.0 04/12/2018   TRIG 96.0 06/14/2021   CHOLHDL 3 06/14/2021         Relevant Medications   rosuvastatin (CRESTOR) 10 MG tablet   Other Visit  Diagnoses     Hyperlipidemia associated with type 2 diabetes mellitus ([place])       Relevant Medications   rosuvastatin (CRESTOR) 10 MG tablet       I spent a total of   26 minutes with this patient in a face to face visit on the date of this encounter reviewing the last office visit with me  in March,   patient'ss diet and eating habits, home blood pressure readings ,  most recent imaging study ,   and post visit ordering of testing and therapeutics.    Follow-up: Return in about 6 months (around 12/16/2021).   Crecencio Mc, MD

## 2021-06-16 NOTE — Assessment & Plan Note (Signed)
Reviewed findings of prior CT scan today..  Patient is taking simvastatin and LDL is well under < 100  Lab Results  Component Value Date   CHOL 153 06/14/2021   HDL 57.50 06/14/2021   LDLCALC 76 06/14/2021   LDLDIRECT 72.0 04/12/2018   TRIG 96.0 06/14/2021   CHOLHDL 3 06/14/2021

## 2021-06-16 NOTE — Assessment & Plan Note (Signed)
Currently well-controlled on current medications .  hemoglobin A1c is at goal of less than 7.0 . Patient is reminded to schedule an annual eye exam and foot exam is normal today. Patient has no microalbuminuria. Patient is tolerating statin therapy for CAD risk reduction and on ACE/ARB for renal protection and hypertension   Lab Results  Component Value Date   HGBA1C 6.5 06/14/2021   Lab Results  Component Value Date   MICROALBUR 1.5 06/14/2021   MICROALBUR 3.6 (H) 07/29/2020    Lab Results  Component Value Date   CHOL 153 06/14/2021   HDL 57.50 06/14/2021   LDLCALC 76 06/14/2021   LDLDIRECT 72.0 04/12/2018   TRIG 96.0 06/14/2021   CHOLHDL 3 06/14/2021

## 2021-07-14 ENCOUNTER — Ambulatory Visit: Payer: Medicare Other | Admitting: Internal Medicine

## 2021-08-26 ENCOUNTER — Telehealth: Payer: Self-pay | Admitting: Internal Medicine

## 2021-08-26 NOTE — Telephone Encounter (Signed)
Spoke with patient she declined AWV for this year

## 2021-09-03 ENCOUNTER — Encounter: Payer: Self-pay | Admitting: Internal Medicine

## 2021-09-03 DIAGNOSIS — E1169 Type 2 diabetes mellitus with other specified complication: Secondary | ICD-10-CM

## 2021-09-03 MED ORDER — SEMAGLUTIDE (2 MG/DOSE) 8 MG/3ML ~~LOC~~ SOPN: 2.0000 mg | PEN_INJECTOR | SUBCUTANEOUS | 1 refills | Status: DC

## 2021-09-29 ENCOUNTER — Ambulatory Visit (INDEPENDENT_AMBULATORY_CARE_PROVIDER_SITE_OTHER): Payer: Medicare Other

## 2021-09-29 DIAGNOSIS — Z23 Encounter for immunization: Secondary | ICD-10-CM | POA: Diagnosis not present

## 2021-11-22 ENCOUNTER — Other Ambulatory Visit: Payer: Self-pay | Admitting: Internal Medicine

## 2021-11-22 DIAGNOSIS — Z1231 Encounter for screening mammogram for malignant neoplasm of breast: Secondary | ICD-10-CM

## 2021-11-23 ENCOUNTER — Encounter: Payer: Self-pay | Admitting: Internal Medicine

## 2021-11-23 MED ORDER — HYDROCHLOROTHIAZIDE 12.5 MG PO TABS: 12.5000 mg | ORAL_TABLET | Freq: Every day | ORAL | 3 refills | Status: DC

## 2021-11-23 MED ORDER — MONTELUKAST SODIUM 10 MG PO TABS: 10.0000 mg | ORAL_TABLET | Freq: Every day | ORAL | 3 refills | Status: DC

## 2021-11-23 MED ORDER — METFORMIN HCL ER 750 MG PO TB24: 1500.0000 mg | ORAL_TABLET | Freq: Every day | ORAL | 3 refills | Status: DC

## 2021-12-13 ENCOUNTER — Other Ambulatory Visit (INDEPENDENT_AMBULATORY_CARE_PROVIDER_SITE_OTHER): Payer: Medicare Other

## 2021-12-13 DIAGNOSIS — E782 Mixed hyperlipidemia: Secondary | ICD-10-CM

## 2021-12-13 DIAGNOSIS — E1169 Type 2 diabetes mellitus with other specified complication: Secondary | ICD-10-CM | POA: Diagnosis not present

## 2021-12-13 LAB — COMPREHENSIVE METABOLIC PANEL
ALT: 23 U/L (ref 0–35)
AST: 20 U/L (ref 0–37)
Albumin: 4.4 g/dL (ref 3.5–5.2)
Alkaline Phosphatase: 67 U/L (ref 39–117)
BUN: 14 mg/dL (ref 6–23)
CO2: 32 mEq/L (ref 19–32)
Calcium: 9.1 mg/dL (ref 8.4–10.5)
Chloride: 101 mEq/L (ref 96–112)
Creatinine, Ser: 0.6 mg/dL (ref 0.40–1.20)
GFR: 86.76 mL/min (ref >60.00)
Glucose, Bld: 115 mg/dL — ABNORMAL HIGH (ref 70–99)
Potassium: 3.6 mEq/L (ref 3.5–5.1)
Sodium: 142 mEq/L (ref 135–145)
Total Bilirubin: 0.7 mg/dL (ref 0.2–1.2)
Total Protein: 6.7 g/dL (ref 6.0–8.3)

## 2021-12-13 LAB — LIPID PANEL
Cholesterol: 147 mg/dL (ref 0–200)
HDL: 63.4 mg/dL (ref >39.00)
LDL Cholesterol: 64 mg/dL (ref 0–99)
NonHDL: 83.3
Total CHOL/HDL Ratio: 2
Triglycerides: 96 mg/dL (ref 0.0–149.0)
VLDL: 19.2 mg/dL (ref 0.0–40.0)

## 2021-12-13 LAB — LDL CHOLESTEROL, DIRECT: Direct LDL: 69 mg/dL

## 2021-12-13 LAB — HEMOGLOBIN A1C: Hgb A1c MFr Bld: 6.6 % — ABNORMAL HIGH (ref 4.6–6.5)

## 2021-12-15 ENCOUNTER — Encounter: Payer: Self-pay | Admitting: Internal Medicine

## 2021-12-15 DIAGNOSIS — E1169 Type 2 diabetes mellitus with other specified complication: Secondary | ICD-10-CM

## 2021-12-15 MED ORDER — SEMAGLUTIDE (2 MG/DOSE) 8 MG/3ML ~~LOC~~ SOPN: 2.0000 mg | PEN_INJECTOR | SUBCUTANEOUS | 1 refills | Status: DC

## 2021-12-16 ENCOUNTER — Encounter: Payer: Self-pay | Admitting: Internal Medicine

## 2021-12-16 ENCOUNTER — Ambulatory Visit (INDEPENDENT_AMBULATORY_CARE_PROVIDER_SITE_OTHER): Payer: Medicare Other | Admitting: Internal Medicine

## 2021-12-16 ENCOUNTER — Ambulatory Visit: Payer: Medicare Other | Admitting: Internal Medicine

## 2021-12-16 VITALS — BP 90/60 | HR 85 | Temp 98.3°F | Ht 59.0 in | Wt 121.4 lb

## 2021-12-16 DIAGNOSIS — Z79899 Other long term (current) drug therapy: Secondary | ICD-10-CM | POA: Diagnosis not present

## 2021-12-16 DIAGNOSIS — I951 Orthostatic hypotension: Secondary | ICD-10-CM | POA: Diagnosis not present

## 2021-12-16 DIAGNOSIS — E782 Mixed hyperlipidemia: Secondary | ICD-10-CM

## 2021-12-16 DIAGNOSIS — I10 Essential (primary) hypertension: Secondary | ICD-10-CM

## 2021-12-16 DIAGNOSIS — C44319 Basal cell carcinoma of skin of other parts of face: Secondary | ICD-10-CM | POA: Diagnosis not present

## 2021-12-16 DIAGNOSIS — E2839 Other primary ovarian failure: Secondary | ICD-10-CM

## 2021-12-16 DIAGNOSIS — C4441 Basal cell carcinoma of skin of scalp and neck: Secondary | ICD-10-CM | POA: Diagnosis not present

## 2021-12-16 DIAGNOSIS — E1169 Type 2 diabetes mellitus with other specified complication: Secondary | ICD-10-CM

## 2021-12-16 DIAGNOSIS — L400 Psoriasis vulgaris: Secondary | ICD-10-CM | POA: Diagnosis not present

## 2021-12-16 DIAGNOSIS — D485 Neoplasm of uncertain behavior of skin: Secondary | ICD-10-CM | POA: Diagnosis not present

## 2021-12-16 NOTE — Patient Instructions (Addendum)
The combination of hctz and Jardiance is dehydrating you.  This could be causing the dizziness.    Suspending hct and amlodipine for now  Follow BP readings ,  once daily  always when dizzy feelings recur  Goal  is 130/80 ,  120/70 at the lowest   Continue telmisartan  and resume amlodipine once the BP is > 140/90  Your annual mammogram AND  DEXA  SCAN can be done on the same day  Please call [facility] to call to make your appointments  .  The phone number for [facility] is  [phone] .    Ask pharmacist for the TDAP vaccine

## 2021-12-16 NOTE — Progress Notes (Signed)
Subjective:  Patient ID: Emily Ryan, female    DOB: [birth year]  Age: 76 y.o. MRN: [id number]  CC: The primary encounter diagnosis was Estrogen deficiency. Diagnoses of DM type 2 with diabetic mixed hyperlipidemia ([place]), Orthostatic hypotension, and White coat syndrome with diagnosis of hypertension were also pertinent to this visit.   HPI Emily Ryan presents for  Chief Complaint  Patient presents with   Medical Management of Chronic Issues    Dizzy spells when moving ,needs Tdap, wants bone density test.    1) Type 2 DM: taking Jardiance and metformin.  Ozempic supply chain disrupted locally but [facility] has a supply and rx was sent but rthe [facility] is requiring a letter of medical necessity.  She is CURRENTLY  TAKING 2 MG DOSE WEEKLY,  Jardiance 10 mg daily, and 1500 mg metformin XR daily She feels generally well, is exercising several times per week and checking blood sugars once daily at variable times.  BS have been under 130 fasting and < 150 post prandially.  Denies any recent hypoglyemic events.  . Following a carbohydrate modified diet 7 days per week. Denies numbness, burning and tingling of extremities. Appetite is diminished and she has lost 16 lbs since starting ozempic.  BMI is   now < 25     2) positional vertigo.  Has been occurring intermittently For the past several weeks . No longer getting up to void at night since starting Jardiance. Home bp's have not been checked.      Outpatient Medications Prior to Visit  Medication Sig Dispense Refill   Ascorbic Acid (VITAMIN C) 1000 MG tablet Take 1,000 mg by mouth daily.      aspirin EC 81 MG tablet Take 81 mg by mouth at bedtime. Swallow whole.     Calcium Carbonate-Vitamin D3 600-400 MG-UNIT TABS Take 1 tablet by mouth daily.     clobetasol cream (TEMOVATE) 5.36 % Apply 1 application topically 2 (two) times daily as needed (psoriasis).     doxycycline (VIBRA-TABS) 100 MG tablet Take 1 tablet (100 mg total)  by mouth 2 (two) times daily. 20 tablet 0   empagliflozin (JARDIANCE) 10 MG TABS tablet Take 1 tablet (10 mg total) by mouth daily before breakfast. 90 tablet 3   escitalopram (LEXAPRO) 5 MG tablet Take 1 tablet (5 mg total) by mouth every evening. 90 tablet 3   folic acid (FOLVITE) 1 MG tablet Take 1 tablet (1 mg total) by mouth See admin instructions. Takes daily except on Saturday 90 tablet 3   glucose blood (FREESTYLE LITE) test strip Use as instructed to test blood sugar once daily 100 each 3   loratadine (CLARITIN) 10 MG tablet Take 1 tablet (10 mg total) by mouth daily. 90 tablet 3   metFORMIN (GLUCOPHAGE XR) 750 MG 24 hr tablet Take 2 tablets (1,500 mg total) by mouth daily with breakfast. 180 tablet 3   methotrexate (RHEUMATREX) 2.5 MG tablet Take 3 tablets (7.5 mg total) by mouth once a week. Caution:Chemotherapy. Protect from light. 36 tablet 1   montelukast (SINGULAIR) 10 MG tablet Take 1 tablet (10 mg total) by mouth at bedtime. 90 tablet 3   Multiple Vitamin (MULTIVITAMIN) capsule Take 1 capsule by mouth daily.     omeprazole (PRILOSEC) 20 MG capsule Take 1 capsule (20 mg total) by mouth daily. 90 capsule 3   Precision Thin Lancets MISC Use as directed to check sugars twice daily 180 each 3   rosuvastatin (CRESTOR)  10 MG tablet Take 1 tablet (10 mg total) by mouth daily. 90 tablet 3   Semaglutide, 2 MG/DOSE, 8 MG/3ML SOPN Inject 2 mg as directed once a week. 9 mL 1   telmisartan (MICARDIS) 80 MG tablet Take 1 tablet (80 mg total) by mouth daily. 90 tablet 3   vitamin E 400 UNIT capsule Take 400 Units by mouth daily.     amLODipine (NORVASC) 2.5 MG tablet TAKE 1 TABLET(2.5 MG) BY MOUTH EVERY EVENING 90 tablet 3   hydrochlorothiazide (HYDRODIURIL) 12.5 MG tablet Take 1 tablet (12.5 mg total) by mouth daily. 90 tablet 3   No facility-administered medications prior to visit.    Review of Systems;  Patient denies headache, fevers, malaise, unintentional weight loss, skin rash, eye  pain, sinus congestion and sinus pain, sore throat, dysphagia,  hemoptysis , cough, dyspnea, wheezing, chest pain, palpitations, orthopnea, edema, abdominal pain, nausea, melena, diarrhea, constipation, flank pain, dysuria, hematuria, urinary  Frequency, nocturia, numbness, tingling, seizures,  Focal weakness, Loss of consciousness,  Tremor, insomnia, depression, anxiety, and suicidal ideation.      Objective:  BP 90/60   Pulse 85   Temp 98.3 F (36.8 C) (Oral)   Ht '4\' 11"'$  (1.499 m)   Wt 121 lb 6.4 oz (55.1 kg)   SpO2 95%   BMI 24.52 kg/m   BP Readings from Last 3 Encounters:  12/16/21 90/60  06/16/21 136/60  03/04/21 136/70    Wt Readings from Last 3 Encounters:  12/16/21 121 lb 6.4 oz (55.1 kg)  06/16/21 121 lb 9.6 oz (55.2 kg)  03/04/21 131 lb 3.2 oz (59.5 kg)    Physical Exam  Lab Results  Component Value Date   HGBA1C 6.6 (H) 12/13/2021   HGBA1C 6.5 06/14/2021   HGBA1C 7.7 (H) 02/01/2021    Lab Results  Component Value Date   CREATININE 0.60 12/13/2021   CREATININE 0.63 06/14/2021   CREATININE 0.70 02/01/2021    Lab Results  Component Value Date   WBC 6.0 07/29/2020   HGB 13.6 07/29/2020   HCT 40.2 07/29/2020   PLT 290.0 07/29/2020   GLUCOSE 115 (H) 12/13/2021   CHOL 147 12/13/2021   TRIG 96.0 12/13/2021   HDL 63.40 12/13/2021   LDLDIRECT 69.0 12/13/2021   LDLCALC 64 12/13/2021   ALT 23 12/13/2021   AST 20 12/13/2021   NA 142 12/13/2021   K 3.6 12/13/2021   CL 101 12/13/2021   CREATININE 0.60 12/13/2021   BUN 14 12/13/2021   CO2 32 12/13/2021   TSH 3.11 01/14/2020   HGBA1C 6.6 (H) 12/13/2021   MICROALBUR 1.5 06/14/2021    No results found.  Assessment & Plan:  .Estrogen deficiency -     DG Bone Density; Future  DM type 2 with diabetic mixed hyperlipidemia ([place]) Assessment & Plan: Currently well-controlled on current regimen of metformin XR 1500 mg ,  Ozempic 2 mg weekly,  and Jardiance 10 mg daily .  hemoglobin A1c is at goal of less  than 7.0 . Patient is reminded to schedule an annual eye exam and foot exam is normal today. Patient has no microalbuminuria. Patient is tolerating statin therapy for CAD risk reduction and on ACE/ARB for renal protection and hypertension   Lab Results  Component Value Date   HGBA1C 6.6 (H) 12/13/2021   Lab Results  Component Value Date   MICROALBUR 1.5 06/14/2021   MICROALBUR 3.6 (H) 07/29/2020    Lab Results  Component Value Date   CHOL 147 12/13/2021  HDL 63.40 12/13/2021   LDLCALC 64 12/13/2021   LDLDIRECT 69.0 12/13/2021   TRIG 96.0 12/13/2021   CHOLHDL 2 12/13/2021      Orthostatic hypotension Assessment & Plan: Resulting in positional lvertigo.  Dc hctz and amlodipine given concurrent  use of Jardiance ; resume amlodipine only for SBP > 140/   White coat syndrome with diagnosis of hypertension Assessment & Plan: Suspending amlodipine and hctz for symptomatic hypotension     Follow-up: Return in about 3 months (around 03/17/2022).   Crecencio Mc, MD

## 2021-12-17 ENCOUNTER — Encounter: Payer: Self-pay | Admitting: Internal Medicine

## 2021-12-18 ENCOUNTER — Encounter: Payer: Self-pay | Admitting: Internal Medicine

## 2021-12-18 DIAGNOSIS — I951 Orthostatic hypotension: Secondary | ICD-10-CM | POA: Insufficient documentation

## 2021-12-18 NOTE — Assessment & Plan Note (Signed)
Currently well-controlled on current regimen of metformin XR 1500 mg ,  Ozempic 2 mg weekly,  and Jardiance 10 mg daily .  hemoglobin A1c is at goal of less than 7.0 . Patient is reminded to schedule an annual eye exam and foot exam is normal today. Patient has no microalbuminuria. Patient is tolerating statin therapy for CAD risk reduction and on ACE/ARB for renal protection and hypertension   Lab Results  Component Value Date   HGBA1C 6.6 (H) 12/13/2021   Lab Results  Component Value Date   MICROALBUR 1.5 06/14/2021   MICROALBUR 3.6 (H) 07/29/2020    Lab Results  Component Value Date   CHOL 147 12/13/2021   HDL 63.40 12/13/2021   LDLCALC 64 12/13/2021   LDLDIRECT 69.0 12/13/2021   TRIG 96.0 12/13/2021   CHOLHDL 2 12/13/2021

## 2021-12-18 NOTE — Assessment & Plan Note (Signed)
Resulting in positional lvertigo.  Dc hctz and amlodipine given concurrent  use of Jardiance ; resume amlodipine only for SBP > 140/

## 2021-12-18 NOTE — Assessment & Plan Note (Signed)
Suspending amlodipine and hctz for symptomatic hypotension

## 2021-12-20 ENCOUNTER — Encounter: Payer: Self-pay | Admitting: Internal Medicine

## 2021-12-21 ENCOUNTER — Encounter: Payer: Self-pay | Admitting: Internal Medicine

## 2021-12-22 ENCOUNTER — Telehealth: Payer: Self-pay | Admitting: Internal Medicine

## 2021-12-22 ENCOUNTER — Encounter: Payer: Self-pay | Admitting: Internal Medicine

## 2021-12-22 NOTE — Telephone Encounter (Signed)
Form has been completed, signed and faxed.

## 2021-12-22 NOTE — Telephone Encounter (Signed)
Completed the form and faxed it.

## 2021-12-22 NOTE — Telephone Encounter (Signed)
Patient dropped off a Personal assistant Necessity form for Ozempic. Form is up front in Dr Lupita Dawn color folder.

## 2021-12-22 NOTE — Telephone Encounter (Signed)
Form has been completed signed and faxed.

## 2021-12-22 NOTE — Telephone Encounter (Signed)
Placed in quick sign folder.  

## 2021-12-22 NOTE — Telephone Encounter (Signed)
Form has been signed faxed.

## 2021-12-23 DIAGNOSIS — Z79899 Other long term (current) drug therapy: Secondary | ICD-10-CM | POA: Diagnosis not present

## 2022-01-04 ENCOUNTER — Encounter: Payer: Self-pay | Admitting: Internal Medicine

## 2022-01-20 ENCOUNTER — Ambulatory Visit
Admission: RE | Admit: 2022-01-20 | Discharge: 2022-01-20 | Disposition: A | Discharge status: Home / Self Care | Payer: Medicare Other | Source: Ambulatory Visit | Attending: Internal Medicine | Admitting: Internal Medicine

## 2022-01-20 DIAGNOSIS — Z1231 Encounter for screening mammogram for malignant neoplasm of breast: Secondary | ICD-10-CM | POA: Insufficient documentation

## 2022-01-27 DIAGNOSIS — C44319 Basal cell carcinoma of skin of other parts of face: Secondary | ICD-10-CM | POA: Diagnosis not present

## 2022-01-28 ENCOUNTER — Other Ambulatory Visit: Payer: Self-pay | Admitting: Internal Medicine

## 2022-02-02 DIAGNOSIS — E119 Type 2 diabetes mellitus without complications: Secondary | ICD-10-CM | POA: Diagnosis not present

## 2022-02-02 DIAGNOSIS — H2513 Age-related nuclear cataract, bilateral: Secondary | ICD-10-CM | POA: Diagnosis not present

## 2022-02-02 LAB — HM DIABETES EYE EXAM

## 2022-02-04 DIAGNOSIS — C4441 Basal cell carcinoma of skin of scalp and neck: Secondary | ICD-10-CM | POA: Diagnosis not present

## 2022-02-23 ENCOUNTER — Ambulatory Visit
Admission: RE | Admit: 2022-02-23 | Discharge: 2022-02-23 | Disposition: A | Discharge status: Home / Self Care | Payer: Medicare Other | Source: Ambulatory Visit | Attending: Internal Medicine | Admitting: Internal Medicine

## 2022-02-23 DIAGNOSIS — E2839 Other primary ovarian failure: Secondary | ICD-10-CM | POA: Diagnosis not present

## 2022-02-23 DIAGNOSIS — M85851 Other specified disorders of bone density and structure, right thigh: Secondary | ICD-10-CM | POA: Diagnosis not present

## 2022-03-17 ENCOUNTER — Ambulatory Visit (INDEPENDENT_AMBULATORY_CARE_PROVIDER_SITE_OTHER): Payer: Medicare Other | Admitting: Internal Medicine

## 2022-03-17 ENCOUNTER — Encounter: Payer: Self-pay | Admitting: Internal Medicine

## 2022-03-17 VITALS — BP 110/70 | HR 80 | Temp 98.6°F | Ht <= 58 in | Wt 119.4 lb

## 2022-03-17 DIAGNOSIS — I700 Atherosclerosis of aorta: Secondary | ICD-10-CM

## 2022-03-17 DIAGNOSIS — I951 Orthostatic hypotension: Secondary | ICD-10-CM

## 2022-03-17 DIAGNOSIS — N952 Postmenopausal atrophic vaginitis: Secondary | ICD-10-CM | POA: Diagnosis not present

## 2022-03-17 DIAGNOSIS — E782 Mixed hyperlipidemia: Secondary | ICD-10-CM

## 2022-03-17 DIAGNOSIS — N941 Unspecified dyspareunia: Secondary | ICD-10-CM

## 2022-03-17 DIAGNOSIS — E1169 Type 2 diabetes mellitus with other specified complication: Secondary | ICD-10-CM

## 2022-03-17 DIAGNOSIS — E785 Hyperlipidemia, unspecified: Secondary | ICD-10-CM | POA: Diagnosis not present

## 2022-03-17 DIAGNOSIS — R5383 Other fatigue: Secondary | ICD-10-CM | POA: Diagnosis not present

## 2022-03-17 MED ORDER — OMEPRAZOLE 20 MG PO CPDR: 20.0000 mg | DELAYED_RELEASE_CAPSULE | Freq: Every day | ORAL | 3 refills | Status: DC

## 2022-03-17 MED ORDER — SEMAGLUTIDE (2 MG/DOSE) 8 MG/3ML ~~LOC~~ SOPN: 2.0000 mg | PEN_INJECTOR | SUBCUTANEOUS | 1 refills | Status: DC

## 2022-03-17 MED ORDER — TELMISARTAN 80 MG PO TABS: 80.0000 mg | ORAL_TABLET | Freq: Every day | ORAL | 3 refills | Status: DC

## 2022-03-17 MED ORDER — ROSUVASTATIN CALCIUM 10 MG PO TABS: 10.0000 mg | ORAL_TABLET | Freq: Every day | ORAL | 3 refills | Status: DC

## 2022-03-17 MED ORDER — DERMACLOUD EX OINT: TOPICAL_OINTMENT | CUTANEOUS | 2 refills | Status: AC

## 2022-03-17 MED ORDER — FOLIC ACID 1 MG PO TABS: 1.0000 mg | ORAL_TABLET | ORAL | 3 refills | Status: DC

## 2022-03-17 MED ORDER — ESCITALOPRAM OXALATE 5 MG PO TABS: 5.0000 mg | ORAL_TABLET | Freq: Every evening | ORAL | 3 refills | Status: DC

## 2022-03-17 MED ORDER — ESTRADIOL 0.1 MG/GM VA CREA: TOPICAL_CREAM | VAGINAL | 12 refills | Status: DC

## 2022-03-17 NOTE — Assessment & Plan Note (Signed)
Advised to resume estrogen cream .  Refill sent to [facility]

## 2022-03-17 NOTE — Assessment & Plan Note (Signed)
Secondary to improvement in blood pressure with 10% weight loss .  Resolved with reduction in anti hypertensives.  She has resumed amlodipine for recent readings of 140 and higher systolic

## 2022-03-17 NOTE — Patient Instructions (Addendum)
CONTINUE OZEMPIC AT 2 MG WEEKLY  UNTIL WEIGHT DROPS TO 115.  TO MAINTAIN THAT WEIGHT YOU MAY NEED TO INCREASE THE TIME BETWEEN SHOTS TO 10 DAYS,  OR YOU MAY NEED TO REQUEST THE LOWER DOSE OF 1 MG   RESUME ESTROGEN CREAM BOTH IN THE VAGINA AND ON THE "CRACKS"  (PERINEAL FISSURE).  USE THE APPLICATOR TO INSERT 1 GRAM INTO THE VAGINA EVERY NIGHT FOR 14 DAYS,  THEN REDUCE USE TO TWICE WEEKLY   APPLY DERMACLOUD CREAM TO THE FISSURE AFTER YOU HAVE APPLIED THE ESTROGEN CREAM  BLOOD PRESSURE IS FINE  ON CURRENT REGIMEN.   DO NOT RESUME HCTZ

## 2022-03-17 NOTE — Assessment & Plan Note (Signed)
Currently well-controlled on current regimen of metformin XR 1500 mg ,  Ozempic 2 mg weekly,  and Jardiance 10 mg daily .  hemoglobin A1c is at goal of less than 7.0 . Patient is still losing weight on  mg ozempic dose , goal is 115 lbs  reminded to schedule an annual eye exam and foot exam is normal today. Patient has no microalbuminuria. Patient is tolerating statin therapy for CAD risk reduction and on ACE/ARB for renal protection and hypertension   Lab Results  Component Value Date   HGBA1C 6.6 (H) 12/13/2021   Lab Results  Component Value Date   MICROALBUR 1.5 06/14/2021   MICROALBUR 3.6 (H) 07/29/2020    Lab Results  Component Value Date   CHOL 147 12/13/2021   HDL 63.40 12/13/2021   LDLCALC 64 12/13/2021   LDLDIRECT 69.0 12/13/2021   TRIG 96.0 12/13/2021   CHOLHDL 2 12/13/2021

## 2022-03-17 NOTE — Progress Notes (Signed)
Subjective:  Patient ID: Emily Ryan, female    DOB: 05/15/1945  Age: 77 y.o. MRN: [id number]  CC: The primary encounter diagnosis was Other fatigue. Diagnoses of Hyperlipidemia associated with type 2 diabetes mellitus ([place]), DM type 2 with diabetic mixed hyperlipidemia ([place]), Vaginal atrophy, Dyspareunia in female, Orthostatic hypotension, and Atherosclerosis of aorta ([place]) were also pertinent to this visit.   HPI Emily Ryan presents for  Chief Complaint  Patient presents with   Medical Management of Chronic Issues    1) HTN:    at last visit was having hypotension on her chronic hypertensive regimen;  since then she has resumed amlodipine for home readings now < 130/80 ,  since resuming  ,  amlodipine readings are all < 130/80 or less .  2) type 2 DM: She  feels generally well, is exercising several times per week and checking blood sugars once daily at variable times.  BS have been under 130 fasting and < 150 post prandially.  Denies any recent hypoglyemic events.  Taking her medications as directed. Following a carbohydrate modified diet 7 days per week. Denies numbness, burning and tingling of extremities. Appetite is good.  Changes since last visit:  she hasstopped glipizide and taking jardiance .  Nocturia x 2   3) history of colon surgery with episodes of perineal bleeding and fissures due to atrophic vagnitis .  Saw Dr Marcelline Mates in 2021,  prescribed estrogen cream but has not been using it for several months . Notes return of symptoms     Outpatient Medications Prior to Visit  Medication Sig Dispense Refill   Ascorbic Acid (VITAMIN C) 1000 MG tablet Take 1,000 mg by mouth daily.      aspirin EC 81 MG tablet Take 81 mg by mouth at bedtime. Swallow whole.     Calcium Carbonate-Vitamin D3 600-400 MG-UNIT TABS Take 1 tablet by mouth daily.     clobetasol cream (TEMOVATE) [id number] % Apply 1 application topically 2 (two) times daily as needed (psoriasis).     doxycycline  (VIBRA-TABS) 100 MG tablet Take 1 tablet (100 mg total) by mouth 2 (two) times daily. 20 tablet 0   glucose blood (FREESTYLE LITE) test strip Use as instructed to test blood sugar once daily 100 each 3   JARDIANCE 10 MG TABS tablet TAKE 1 TABLET DAILY BEFORE BREAKFAST 90 tablet 3   loratadine (CLARITIN) 10 MG tablet Take 1 tablet (10 mg total) by mouth daily. 90 tablet 3   metFORMIN (GLUCOPHAGE XR) 750 MG 24 hr tablet Take 2 tablets (1,500 mg total) by mouth daily with breakfast. 180 tablet 3   methotrexate (RHEUMATREX) 2.5 MG tablet Take 3 tablets (7.5 mg total) by mouth once a week. Caution:Chemotherapy. Protect from light. 36 tablet 1   montelukast (SINGULAIR) 10 MG tablet Take 1 tablet (10 mg total) by mouth at bedtime. 90 tablet 3   Multiple Vitamin (MULTIVITAMIN) capsule Take 1 capsule by mouth daily.     Precision Thin Lancets MISC Use as directed to check sugars twice daily 180 each 3   vitamin E 400 UNIT capsule Take 400 Units by mouth daily.     escitalopram (LEXAPRO) 5 MG tablet Take 1 tablet (5 mg total) by mouth every evening. 90 tablet 3   folic acid (FOLVITE) 1 MG tablet Take 1 tablet (1 mg total) by mouth See admin instructions. Takes daily except on Saturday 90 tablet 3   omeprazole (PRILOSEC) 20 MG capsule Take 1 capsule (20  mg total) by mouth daily. 90 capsule 3   rosuvastatin (CRESTOR) 10 MG tablet Take 1 tablet (10 mg total) by mouth daily. 90 tablet 3   Semaglutide, 2 MG/DOSE, 8 MG/3ML SOPN Inject 2 mg as directed once a week. 9 mL 1   telmisartan (MICARDIS) 80 MG tablet Take 1 tablet (80 mg total) by mouth daily. 90 tablet 3   No facility-administered medications prior to visit.    Review of Systems;  Patient denies headache, fevers, malaise, unintentional weight loss, skin rash, eye pain, sinus congestion and sinus pain, sore throat, dysphagia,  hemoptysis , cough, dyspnea, wheezing, chest pain, palpitations, orthopnea, edema, abdominal pain, nausea, melena, diarrhea,  constipation, flank pain, dysuria, hematuria, urinary  Frequency, nocturia, numbness, tingling, seizures,  Focal weakness, Loss of consciousness,  Tremor, insomnia, depression, anxiety, and suicidal ideation.      Objective:  BP 110/70   Pulse 80   Temp 98.6 F (37 C) (Oral)   Ht 4\' 10"  (1.473 m)   Wt 119 lb 6.4 oz (54.2 kg)   SpO2 97%   BMI 24.95 kg/m   BP Readings from Last 3 Encounters:  03/17/22 110/70  12/16/21 90/60  06/16/21 136/60    Wt Readings from Last 3 Encounters:  03/17/22 119 lb 6.4 oz (54.2 kg)  12/16/21 121 lb 6.4 oz (55.1 kg)  06/16/21 121 lb 9.6 oz (55.2 kg)    Physical Exam Vitals reviewed.  Constitutional:      General: She is not in acute distress.    Appearance: Normal appearance. She is normal weight. She is not ill-appearing, toxic-appearing or diaphoretic.  HENT:     Head: Normocephalic.  Eyes:     General: No scleral icterus.       Right eye: No discharge.        Left eye: No discharge.     Conjunctiva/sclera: Conjunctivae normal.  Cardiovascular:     Rate and Rhythm: Normal rate and regular rhythm.     Heart sounds: Normal heart sounds.  Pulmonary:     Effort: Pulmonary effort is normal. No respiratory distress.     Breath sounds: Normal breath sounds.  Musculoskeletal:        General: Normal range of motion.  Skin:    General: Skin is warm and dry.  Neurological:     General: No focal deficit present.     Mental Status: She is alert and oriented to person, place, and time. Mental status is at baseline.  Psychiatric:        Mood and Affect: Mood normal.        Behavior: Behavior normal.        Thought Content: Thought content normal.        Judgment: Judgment normal.     Lab Results  Component Value Date   HGBA1C 6.6 (H) 12/13/2021   HGBA1C 6.5 06/14/2021   HGBA1C 7.7 (H) 02/01/2021    Lab Results  Component Value Date   CREATININE 0.60 12/13/2021   CREATININE 0.63 06/14/2021   CREATININE 0.70 02/01/2021    Lab  Results  Component Value Date   WBC 6.0 07/29/2020   HGB 13.6 07/29/2020   HCT 40.2 07/29/2020   PLT 290.0 07/29/2020   GLUCOSE 115 (H) 12/13/2021   CHOL 147 12/13/2021   TRIG 96.0 12/13/2021   HDL 63.40 12/13/2021   LDLDIRECT 69.0 12/13/2021   LDLCALC 64 12/13/2021   ALT 23 12/13/2021   AST 20 12/13/2021   Emily Ryan 142 12/13/2021  K 3.6 12/13/2021   CL 101 12/13/2021   CREATININE 0.60 12/13/2021   BUN 14 12/13/2021   CO2 32 12/13/2021   TSH 3.11 01/14/2020   HGBA1C 6.6 (H) 12/13/2021   MICROALBUR 1.5 06/14/2021    DG Bone Density  Result Date: 02/23/2022 EXAM: DUAL X-RAY ABSORPTIOMETRY (DXA) FOR BONE MINERAL DENSITY IMPRESSION: Your patient Emily Ryan completed a BMD test on 02/23/2022 using the [employer] (software version: 14.10) manufactured by [employer]. The following summarizes the results of our evaluation. Technologist: SCE PATIENT BIOGRAPHICAL: Name: Emily Ryan, Emily Ryan Patient ID:  [id number] Birth Date: [birth year] Height:     58.2 in. Gender:      Female Exam Date:  02/23/2022 Weight:     120.7 lbs. Indications: Advanced Age, Caucasian, Diabetic, Height Loss, History of Fracture (Adult), Hysterectomy, Postmenopausal Fractures: ankle Treatments: calcium w/ vit D, Jardiance, Metformin, methotrexate, Ozempic Injection DENSITOMETRY RESULTS: Site      Region     Measured Date Measured Age WHO Classification Young Adult T-score BMD         %Change vs. Previous Significant Change (*) AP Spine L1-L4 1/23/2021 77.3 Normal -0.6 1.124 g/cm2 1.5% - AP Spine L1-L4 07/13/2015 70.7 Normal -0.7 1.107 g/cm2 - - DualFemur Neck Right 02/23/2022 77.3 Osteopenia -2.0 0.757 g/cm2 -1.2% - DualFemur Neck Right 07/13/2015 70.7 Osteopenia -2.0 0.766 g/cm2 - - DualFemur Total Mean 02/23/2022 77.3 Osteopenia -1.5 0.821 g/cm2 -8.8% Yes DualFemur Total Mean 07/13/2015 70.7 Normal -0.9 0.900 g/cm2 - - ASSESSMENT: The BMD measured at Femur Neck Right is 0.757 g/cm2 with a T-score of  -2.0. This patient is considered osteopenic according to [facility]) criteria. The scan quality is good. Compared with prior study, there has been no significant change in the spine. Compared with prior study, there has been a significant decrease in the total hip. World Pharmacologist [facility]) criteria for post-menopausal, Caucasian Women: Normal:                   T-score at or above -1 SD Osteopenia/low bone mass: T-score between -1 and -2.5 SD Osteoporosis:             T-score at or below -2.5 SD RECOMMENDATIONS: 1. All patients should optimize calcium and vitamin D intake. 2. Consider FDA-approved medical therapies in postmenopausal women and men aged 51 years and older, based on the following: a. A hip or vertebral(clinical or morphometric) fracture b. T-score < -2.5 at the femoral neck or spine after appropriate evaluation to exclude secondary causes c. Low bone mass (T-score between -1.0 and -2.5 at the femoral neck or spine) and a 10-year probability of a hip fracture > 3% or a 10-year probability of a major osteoporosis-related fracture > 20% based on the [place]-adapted WHO algorithm 3. Clinician judgment and/or patient preferences may indicate treatment for people with 10-year fracture probabilities above or below these levels FOLLOW-UP: People with diagnosed cases of osteoporosis or at high risk for fracture should have regular bone mineral density tests. For patients eligible for Medicare, routine testing is allowed once every 2 years. The testing frequency can be increased to one year for patients who have rapidly progressing disease, those who are receiving or discontinuing medical therapy to restore bone mass, or have additional risk factors. I have reviewed this report, and agree with the above findings. [facility] Dear Dr Derrel Nip, Your patient Emily Ryan completed a FRAX assessment on 02/23/2022 using the [employer] (  analysis version: 14.10)  manufactured by [employer]. The following summarizes the results of our evaluation. PATIENT BIOGRAPHICAL: Name: Emily Ryan, Emily Ryan Patient ID: [id number] Birth Date: 06-01-1945 Height:    58.2 in. Gender:     [age]    [age]       Weight:    120.7 lbs. Ethnicity:  White                            Exam Date: 02/23/2022 FRAX* RESULTS:  (version: 3.5) 10-year Probability of Fracture1 Major Osteoporotic Fracture2 Hip Fracture 21.8% 5.7% Population: [place] (Caucasian) Risk Factors: History of Fracture (Adult) Based on Femur (Right) Neck BMD 1 -The 10-year probability of fracture may be lower than reported if the patient has received treatment. 2 -Major Osteoporotic Fracture: Clinical Spine, Forearm, Hip or Shoulder *FRAX is a Materials engineer of the [employer] for Metabolic Bone Disease, a [place] (WHO) [facility]. ASSESSMENT: The probability of a major osteoporotic fracture is 21.8% within the next ten years. The probability of a hip fracture is 5.7% within the next ten years. Electronically Signed   By: Ammie Ferrier M.D.   On: 02/23/2022 10:04    Assessment & Plan:  .Other fatigue -     CBC with Differential/Platelet; Future -     TSH; Future  Hyperlipidemia associated with type 2 diabetes mellitus (HCC) -     Rosuvastatin Calcium; Take 1 tablet (10 mg total) by mouth daily.  Dispense: 90 tablet; Refill: 3 -     LDL cholesterol, direct; Future -     Lipid panel; Future  DM type 2 with diabetic mixed hyperlipidemia ([place]) Assessment & Plan: Currently well-controlled on current regimen of metformin XR 1500 mg ,  Ozempic 2 mg weekly,  and Jardiance 10 mg daily .  hemoglobin A1c is at goal of less than 7.0 . Patient is still losing weight on  mg ozempic dose , goal is 115 lbs  reminded to schedule an annual eye exam and foot exam is normal today. Patient has no microalbuminuria. Patient is tolerating statin therapy for CAD risk reduction  and on ACE/ARB for renal protection and hypertension   Lab Results  Component Value Date   HGBA1C 6.6 (H) 12/13/2021   Lab Results  Component Value Date   MICROALBUR 1.5 06/14/2021   MICROALBUR 3.6 (H) 07/29/2020    Lab Results  Component Value Date   CHOL 147 12/13/2021   HDL 63.40 12/13/2021   LDLCALC 64 12/13/2021   LDLDIRECT 69.0 12/13/2021   TRIG 96.0 12/13/2021   CHOLHDL 2 12/13/2021     Orders: -     Semaglutide (2 MG/DOSE); Inject 2 mg as directed once a week.  Dispense: 9 mL; Refill: 1 -     Telmisartan; Take 1 tablet (80 mg total) by mouth daily.  Dispense: 90 tablet; Refill: 3 -     Comprehensive metabolic panel; Future -     Hemoglobin A1c; Future -     Microalbumin / creatinine urine ratio; Future  Vaginal atrophy Assessment & Plan: WITH RECURRENCE OF PERINEAL FISSURE.  Advised to resume estrogen cream and add dermacloud    Dyspareunia in female Assessment & Plan: Advised to resume estrogen cream .  Refill sent to DOD   Orthostatic hypotension Assessment & Plan: Secondary to improvement in blood pressure with 10% weight loss .  Resolved with reduction  in anti hypertensives.  She has resumed amlodipine for recent readings of 140 and higher systolic   Atherosclerosis of aorta [facility]) Assessment & Plan: Reviewed findings of prior CT scan today..  Patient is taking simvastatin and LDL is well under < 100  Lab Results  Component Value Date   CHOL 147 12/13/2021   HDL 63.40 12/13/2021   LDLCALC 64 12/13/2021   LDLDIRECT 69.0 12/13/2021   TRIG 96.0 12/13/2021   CHOLHDL 2 12/13/2021      Other orders -     Escitalopram Oxalate; Take 1 tablet (5 mg total) by mouth every evening.  Dispense: 90 tablet; Refill: 3 -     Folic Acid; Take 1 tablet (1 mg total) by mouth See admin instructions. Takes daily except on Saturday  Dispense: 90 tablet; Refill: 3 -     Omeprazole; Take 1 capsule (20 mg total) by mouth daily.  Dispense: 90 capsule; Refill: 3 -      Dermacloud; APPLY TO PERINEUM AFTER ESTROGEN CREAM . OK TO REAPPLY AS NEEDED  Dispense: 430 g; Refill: 2 -     Estradiol; APPLY 1 GRAM INTRAVAGINALLY AND TO PERINEUM NIGHTLY X 14 DAYS,  THEN TWICE WEEKLY THEREAFTER  Dispense: 42.5 g; Refill: 12     Follow-up: Return in about 3 months (around 06/17/2022) for follow up diabetes.   Crecencio Mc, MD

## 2022-03-17 NOTE — Assessment & Plan Note (Signed)
WITH RECURRENCE OF PERINEAL FISSURE.  Advised to resume estrogen cream and add dermacloud

## 2022-03-19 NOTE — Assessment & Plan Note (Signed)
Reviewed findings of prior CT scan today..  Patient is taking simvastatin and LDL is well under < 100  Lab Results  Component Value Date   CHOL 147 12/13/2021   HDL 63.40 12/13/2021   LDLCALC 64 12/13/2021   LDLDIRECT 69.0 12/13/2021   TRIG 96.0 12/13/2021   CHOLHDL 2 12/13/2021

## 2022-04-05 ENCOUNTER — Encounter: Payer: Self-pay | Admitting: Internal Medicine

## 2022-04-05 DIAGNOSIS — E1169 Type 2 diabetes mellitus with other specified complication: Secondary | ICD-10-CM

## 2022-04-06 MED ORDER — TELMISARTAN 80 MG PO TABS: 80.0000 mg | ORAL_TABLET | Freq: Every day | ORAL | 3 refills | Status: DC

## 2022-05-20 IMAGING — MR MR SHOULDER*R* W/O CM
4 of 5 series · 32 of 40 positions shown · non-contrast
Comparison: None.

CLINICAL DATA: Right shoulder pain for 3 months. Painful range of
motion.

EXAM:
MRI OF THE RIGHT SHOULDER WITHOUT CONTRAST
TECHNIQUE: Multiplanar, multisequence MR imaging of the shoulder was performed.
No intravenous contrast was administered.

[Series 5: T2 fat-sat · axial · right · 4.0mm · 0.44mm/px · z∈[-84,+36]mm · 8 of 26 slices shown (1 of 3)]
[im 1/26]
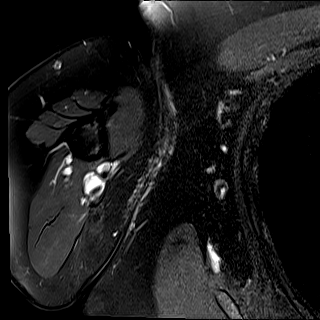
[im 4/26]
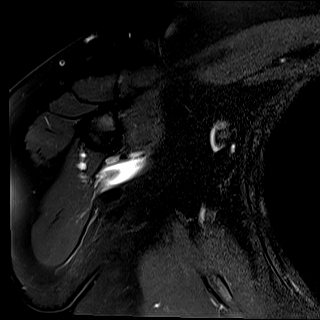
[im 8/26]
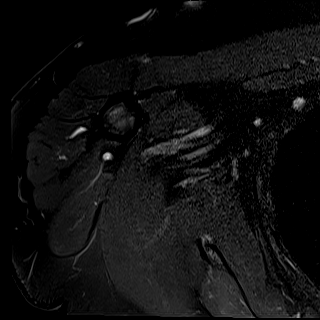
[im 11/26]
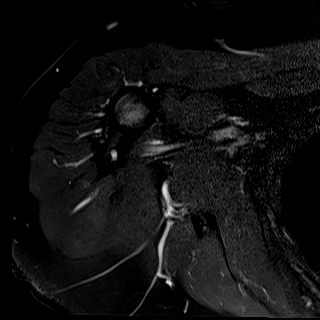
[im 15/26]
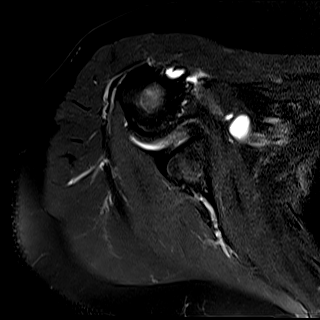
[im 18/26]
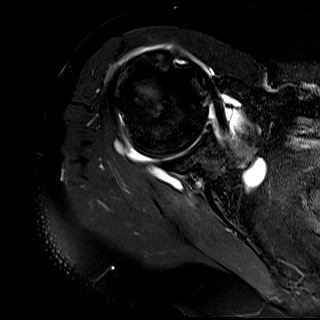
[im 22/26]
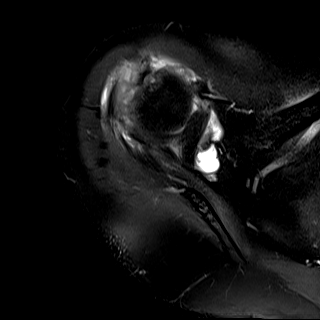
[im 26/26]
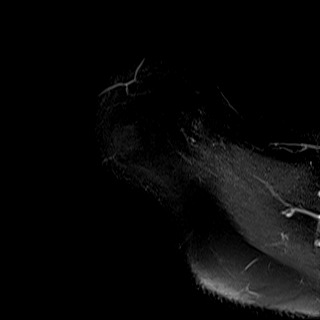

[Series 6: PD · oblique · right · 4.0mm · 0.44mm/px · 9 of 26 slices shown]
[im 1/26]
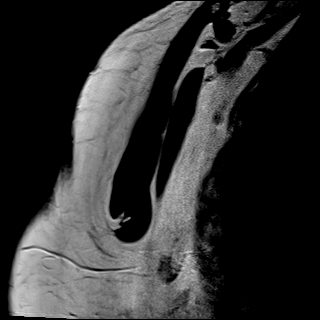
[im 4/26]
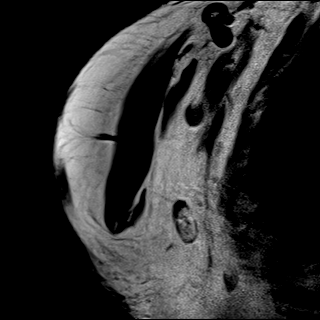
[im 7/26]
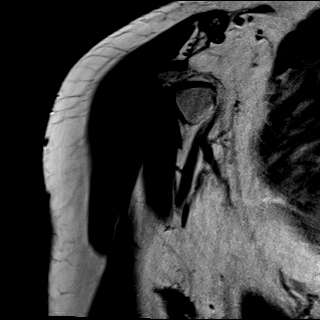
[im 10/26]
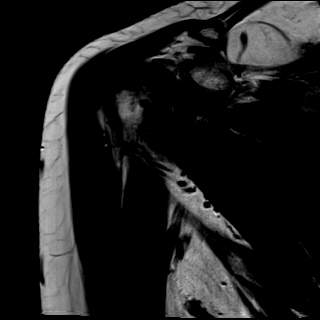
[im 13/26]
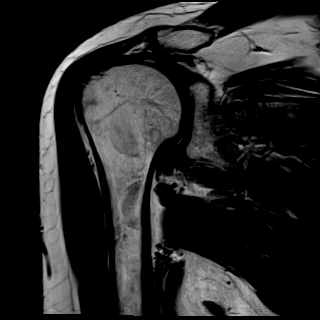
[im 16/26]
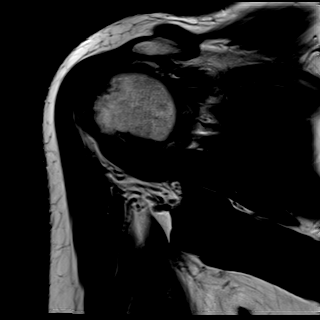
[im 19/26]
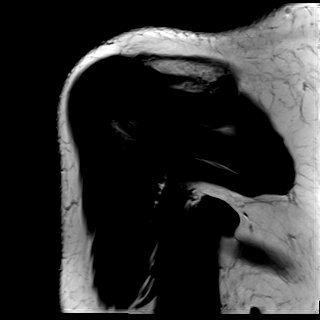
[im 22/26]
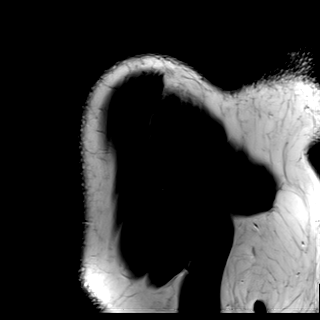
[im 26/26]
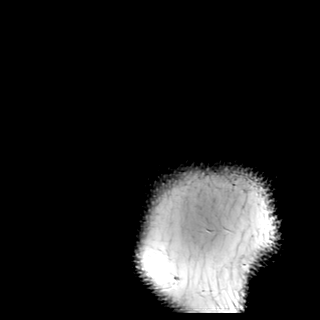

[Series 7: T2 fat-sat · oblique · right · 4.0mm · 0.44mm/px · 9 of 26 slices shown (2 of 3)]
[im 1/26]
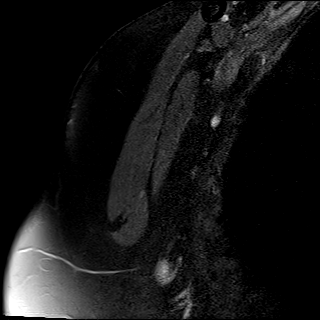
[im 4/26]
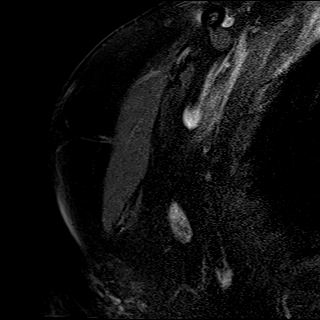
[im 7/26]
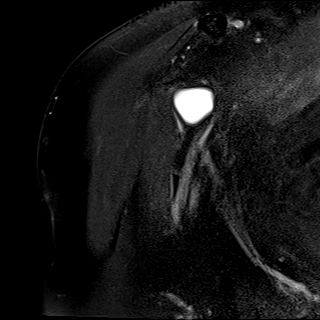
[im 10/26]
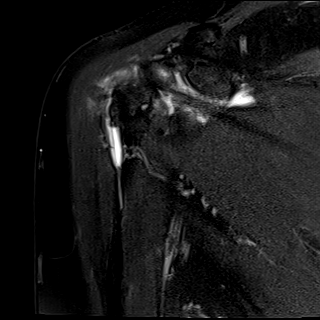
[im 13/26]
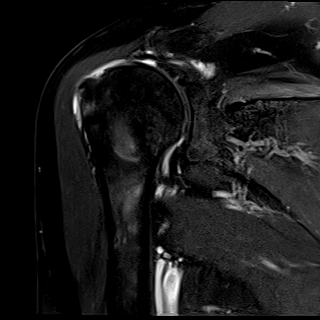
[im 16/26]
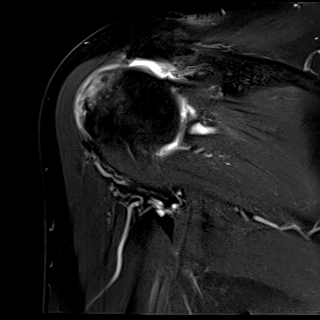
[im 19/26]
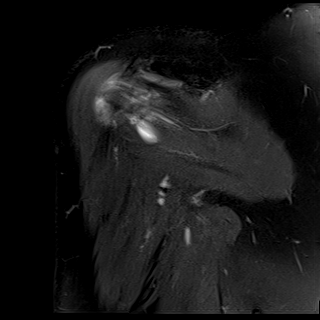
[im 22/26]
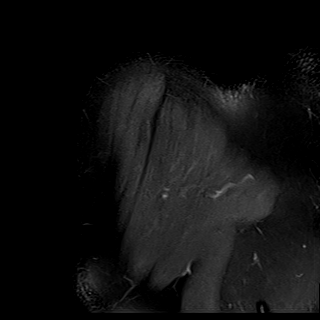
[im 26/26]
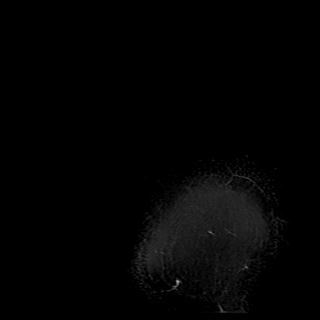

[Series 8: T2 fat-sat · coronal · right · 4.0mm · 0.23mm/px · 6 of 22 slices shown (3 of 3)]
[im 1/22]
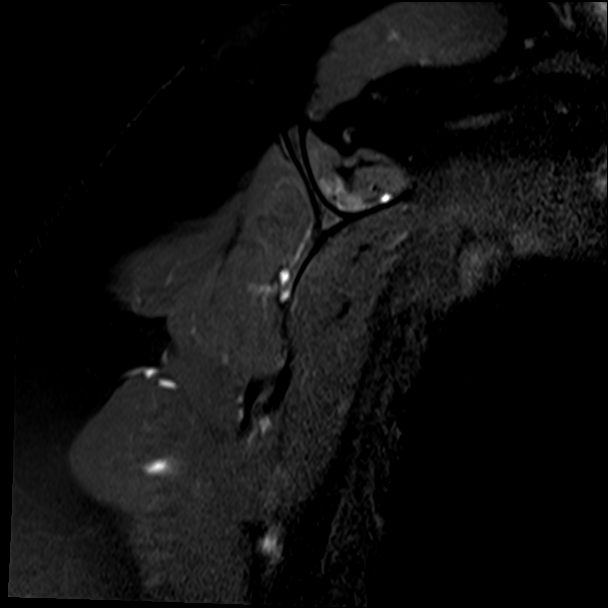
[im 4/22]
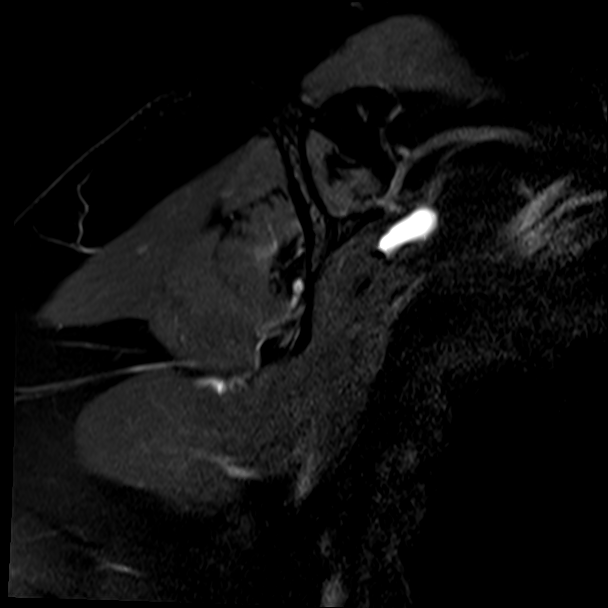
[im 8/22]
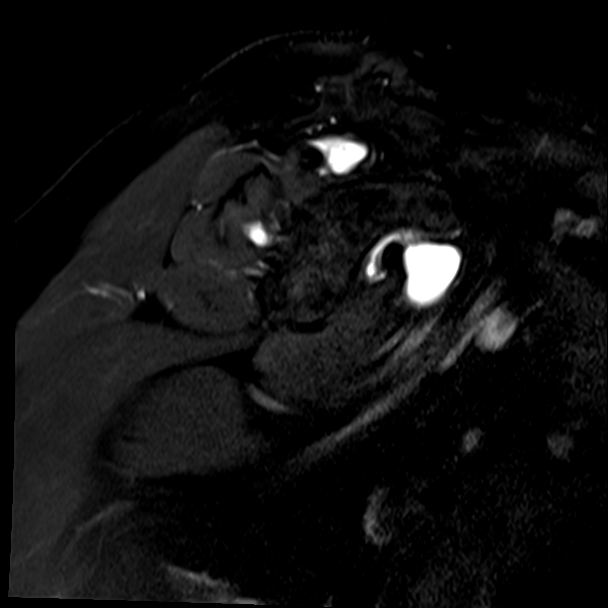
[im 11/22]
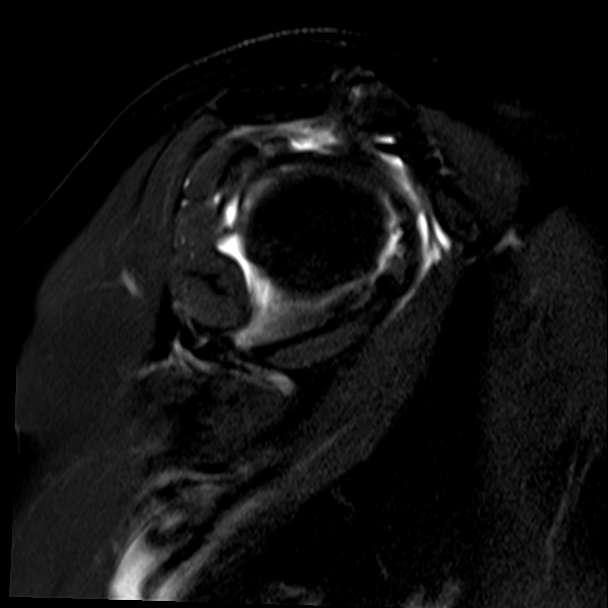
[im 15/22]
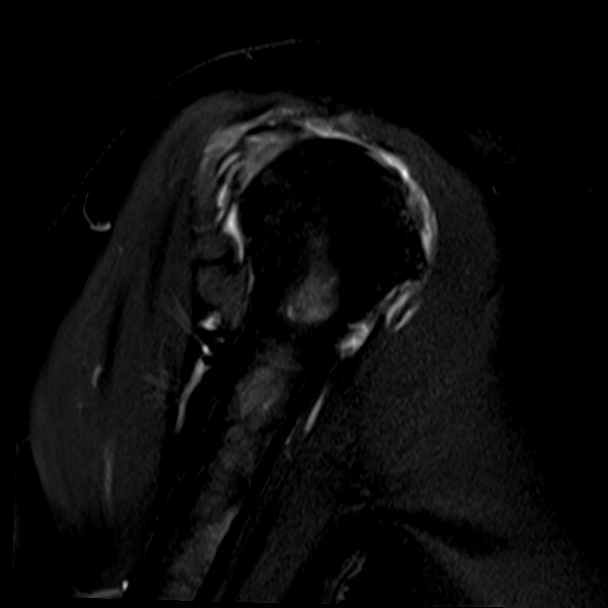
[im 18/22]
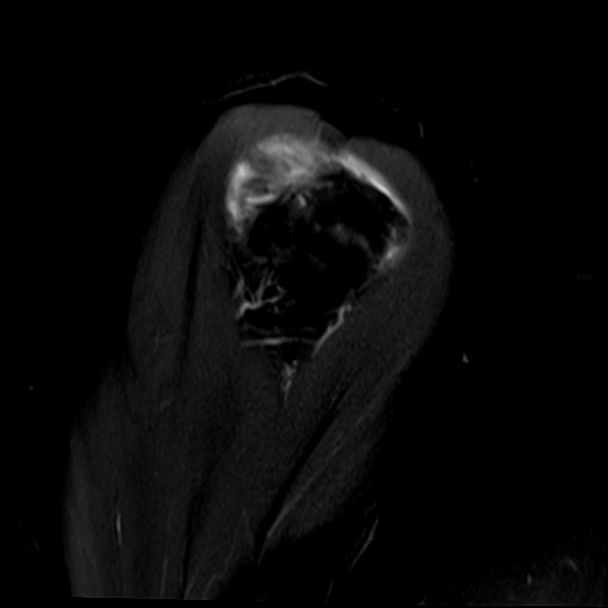

[32 of 40 positions shown; findings below may reference images not displayed]

FINDINGS: Rotator cuff: Large full-thickness retracted supraspinatus tendon
tear. Maximum retraction is 3 cm. There is significant tendinopathy,
interstitial tears and partial-thickness bursal and articular
surface tearing of the infraspinatus tendon but no complete
tear/rupture.

The upper fibers of the subscapularis tendon are torn and retracted
approximately 10 mm. The mid and lower fibers are intact.

Muscles:  Fatty atrophy of the supraspinatus muscle.

Biceps long head:  Torn and retracted.

Acromioclavicular Joint: Mild to moderate degenerative changes. Type
1 acromion. No lateral downsloping or subacromial spurring.

Glenohumeral Joint: Mild degenerative changes. Small joint effusion.

Labrum: Degenerative changes involving the superior labrum. There
appears to be I residual piece of the long head biceps tendon
attached to the biceps anchor. The anterior and posterior labrum are
intact.

Bones:  No acute bony findings.

Other: Expected fluid in the subacromial/subdeltoid bursa and
expected narrowing of the humeroacromial space.
IMPRESSION: 1. Large chronic full-thickness retracted supraspinatus tendon tear.
Associated fatty atrophy of the supraspinatus muscle.
2. Significant tendinopathy, interstitial tears and
partial-thickness bursal and articular surface tearing of the
infraspinatus tendon.
3. Small retracted tear involving the upper fibers of the
subscapularis tendon.
4. Torn and retracted long head biceps tendon.
5. Degenerative changes involving the superior labrum. I believe
part of the torn biceps tendon is still attached to the glenoid
labrum.

## 2022-05-23 ENCOUNTER — Encounter: Payer: Self-pay | Admitting: Internal Medicine

## 2022-05-23 DIAGNOSIS — E1169 Type 2 diabetes mellitus with other specified complication: Secondary | ICD-10-CM

## 2022-05-23 MED ORDER — ROSUVASTATIN CALCIUM 10 MG PO TABS: 10.0000 mg | ORAL_TABLET | Freq: Every day | ORAL | 3 refills | Status: DC

## 2022-05-24 MED ORDER — AMLODIPINE BESYLATE 2.5 MG PO TABS: 2.5000 mg | ORAL_TABLET | Freq: Every day | ORAL | 3 refills | Status: DC

## 2022-05-24 NOTE — Addendum Note (Signed)
Addended by: Sandy Salaam on: 05/24/2022 03:13 PM   Modules accepted: Orders

## 2022-05-24 NOTE — Telephone Encounter (Signed)
Amlodipine 2.5 mg is not in patients current medication list is it okay to refill?

## 2022-05-26 ENCOUNTER — Telehealth: Payer: Self-pay

## 2022-05-26 NOTE — Telephone Encounter (Signed)
Patient returned miss phone call.

## 2022-05-26 NOTE — Patient Outreach (Signed)
  Care Coordination   05/26/2022 Name: Emily Ryan MRN: [id number] DOB: 09-Jul-1945   Care Coordination Outreach Attempts:  An unsuccessful telephone outreach was attempted today to offer the patient information about available care coordination services.  Follow Up Plan:  Additional outreach attempts will be made to offer the patient care coordination information and services.   Encounter Outcome:  No Answer   Care Coordination Interventions:  No, not indicated    George Ina [facility] Care Coordination [phone] direct line

## 2022-06-06 MED ORDER — SEMAGLUTIDE (2 MG/DOSE) 8 MG/3ML ~~LOC~~ SOPN
2.0000 mg | PEN_INJECTOR | SUBCUTANEOUS | 3 refills | Status: DC
Start: 2022-06-06 — End: 2023-06-23

## 2022-06-06 NOTE — Addendum Note (Signed)
Addended by: Sandy Salaam on: 06/06/2022 11:54 AM   Modules accepted: Orders

## 2022-06-16 ENCOUNTER — Other Ambulatory Visit (INDEPENDENT_AMBULATORY_CARE_PROVIDER_SITE_OTHER): Payer: Medicare Other

## 2022-06-16 DIAGNOSIS — R5383 Other fatigue: Secondary | ICD-10-CM | POA: Diagnosis not present

## 2022-06-16 DIAGNOSIS — E1169 Type 2 diabetes mellitus with other specified complication: Secondary | ICD-10-CM

## 2022-06-16 DIAGNOSIS — E782 Mixed hyperlipidemia: Secondary | ICD-10-CM | POA: Diagnosis not present

## 2022-06-16 DIAGNOSIS — E785 Hyperlipidemia, unspecified: Secondary | ICD-10-CM

## 2022-06-16 LAB — COMPREHENSIVE METABOLIC PANEL
ALT: 18 U/L (ref 0–35)
AST: 21 U/L (ref 0–37)
Albumin: 4.1 g/dL (ref 3.5–5.2)
Alkaline Phosphatase: 79 U/L (ref 39–117)
BUN: 12 mg/dL (ref 6–23)
CO2: 31 mEq/L (ref 19–32)
Calcium: 9.3 mg/dL (ref 8.4–10.5)
Chloride: 99 mEq/L (ref 96–112)
Creatinine, Ser: 0.62 mg/dL (ref 0.40–1.20)
GFR: 85.77 mL/min (ref >60.00)
Glucose, Bld: 114 mg/dL — ABNORMAL HIGH (ref 70–99)
Potassium: 4.3 mEq/L (ref 3.5–5.1)
Sodium: 137 mEq/L (ref 135–145)
Total Bilirubin: 0.8 mg/dL (ref 0.2–1.2)
Total Protein: 6.8 g/dL (ref 6.0–8.3)

## 2022-06-16 LAB — CBC WITH DIFFERENTIAL/PLATELET
Basophils Absolute: 0 10*3/uL (ref 0.0–0.1)
Basophils Relative: 0.5 % (ref 0.0–3.0)
Eosinophils Absolute: 0.1 10*3/uL (ref 0.0–0.7)
Eosinophils Relative: 1.8 % (ref 0.0–5.0)
HCT: 43.4 % (ref 36.0–46.0)
Hemoglobin: 14.4 g/dL (ref 12.0–15.0)
Lymphocytes Relative: 28.2 % (ref 12.0–46.0)
Lymphs Abs: 1.6 10*3/uL (ref 0.7–4.0)
MCHC: 33.2 g/dL (ref 30.0–36.0)
MCV: 100.1 fl — ABNORMAL HIGH (ref 78.0–100.0)
Monocytes Absolute: 0.7 10*3/uL (ref 0.1–1.0)
Monocytes Relative: 11.9 % (ref 3.0–12.0)
Neutro Abs: 3.2 10*3/uL (ref 1.4–7.7)
Neutrophils Relative %: 57.6 % (ref 43.0–77.0)
Platelets: 235 10*3/uL (ref 150.0–400.0)
RBC: 4.34 Mil/uL (ref 3.87–5.11)
RDW: 14.1 % (ref 11.5–15.5)
WBC: 5.5 10*3/uL (ref 4.0–10.5)

## 2022-06-16 LAB — LDL CHOLESTEROL, DIRECT: Direct LDL: 71 mg/dL

## 2022-06-16 LAB — LIPID PANEL
Cholesterol: 136 mg/dL (ref 0–200)
HDL: 60.6 mg/dL (ref >39.00)
LDL Cholesterol: 61 mg/dL (ref 0–99)
NonHDL: 75.67
Total CHOL/HDL Ratio: 2
Triglycerides: 73 mg/dL (ref 0.0–149.0)
VLDL: 14.6 mg/dL (ref 0.0–40.0)

## 2022-06-16 LAB — HEMOGLOBIN A1C: Hgb A1c MFr Bld: 6 % (ref 4.6–6.5)

## 2022-06-16 LAB — MICROALBUMIN / CREATININE URINE RATIO
Creatinine,U: 88.5 mg/dL
Microalb Creat Ratio: 0.8 mg/g (ref 0.0–30.0)
Microalb, Ur: <0.7 mg/dL (ref 0.0–1.9)

## 2022-06-16 LAB — TSH: TSH: 1.83 u[IU]/mL (ref 0.35–5.50)

## 2022-06-17 ENCOUNTER — Telehealth: Payer: Self-pay

## 2022-06-17 NOTE — Telephone Encounter (Signed)
LMTCB in regards to lab results.  

## 2022-06-17 NOTE — Telephone Encounter (Signed)
-----   Message from Eulis Foster, FNP sent at 06/16/2022 10:13 PM EDT ----- Labs stable. A1c is good. Continue current therapy

## 2022-06-17 NOTE — Telephone Encounter (Signed)
Patient called and note was read. 

## 2022-06-17 NOTE — Telephone Encounter (Signed)
noted 

## 2022-06-20 ENCOUNTER — Ambulatory Visit (INDEPENDENT_AMBULATORY_CARE_PROVIDER_SITE_OTHER): Payer: Medicare Other | Admitting: Internal Medicine

## 2022-06-20 ENCOUNTER — Encounter: Payer: Self-pay | Admitting: Internal Medicine

## 2022-06-20 VITALS — BP 130/62 | HR 85 | Temp 98.1°F | Ht <= 58 in | Wt 120.2 lb

## 2022-06-20 DIAGNOSIS — E782 Mixed hyperlipidemia: Secondary | ICD-10-CM

## 2022-06-20 DIAGNOSIS — Z7984 Long term (current) use of oral hypoglycemic drugs: Secondary | ICD-10-CM

## 2022-06-20 DIAGNOSIS — E1169 Type 2 diabetes mellitus with other specified complication: Secondary | ICD-10-CM

## 2022-06-20 MED ORDER — LORATADINE 10 MG PO TABS: 10.0000 mg | ORAL_TABLET | Freq: Every day | ORAL | 3 refills | Status: DC

## 2022-06-20 MED ORDER — DOXYCYCLINE HYCLATE 100 MG PO TABS: 100.0000 mg | ORAL_TABLET | Freq: Two times a day (BID) | ORAL | 0 refills | Status: DC

## 2022-06-20 NOTE — Assessment & Plan Note (Signed)
Currently well-controlled on current regimen of metformin XR 1500 mg ,  Ozempic 2 mg weekly,  and Jardiance 10 mg daily .  hemoglobin A1c is at goal of less than 7.0 . Patient is still losing weight on  mg ozempic dose , goal is 115 lbs  reminded to schedule an annual eye exam and foot exam is normal today. Patient has no microalbuminuria. Patient is tolerating statin therapy for CAD risk reduction and on ACE/ARB for renal protection and hypertension   Lab Results  Component Value Date   HGBA1C 6.0 06/16/2022   Lab Results  Component Value Date   MICROALBUR <0.7 06/16/2022   MICROALBUR 1.5 06/14/2021    Lab Results  Component Value Date   CHOL 136 06/16/2022   HDL 60.60 06/16/2022   LDLCALC 61 06/16/2022   LDLDIRECT 71.0 06/16/2022   TRIG 73.0 06/16/2022   CHOLHDL 2 06/16/2022

## 2022-06-20 NOTE — Assessment & Plan Note (Addendum)
LDL is 71 and triglycerides are at goal on rosuvastatin . She has no side effects and ALT  is normal.   No changes today. Continue aspirin low dose.  Lab Results  Component Value Date   CHOL 136 06/16/2022   HDL 60.60 06/16/2022   LDLCALC 61 06/16/2022   LDLDIRECT 71.0 06/16/2022   TRIG 73.0 06/16/2022   CHOLHDL 2 06/16/2022   Lab Results  Component Value Date   ALT 18 06/16/2022   AST 21 06/16/2022   ALKPHOS 79 06/16/2022   BILITOT 0.8 06/16/2022

## 2022-06-20 NOTE — Progress Notes (Signed)
Subjective:  Patient ID: Emily Ryan, female    DOB: [birth year]  Age: 77 y.o. MRN: [id number]  CC: The primary encounter diagnosis was DM type 2 with diabetic mixed hyperlipidemia (HCC). A diagnosis of Mixed hyperlipidemia was also pertinent to this visit.   HPI Emily Ryan presents for  Chief Complaint  Patient presents with   Medical Management of Chronic Issues    3 month follow up on diabetes   1) type 2 DM:   T2DM:  She  feels generally well,   exercising regularly and maintaining an ideal  weight. Checking  blood sugars once daily at variable times, usually only if she feels she may be having a hypoglycemic event. .  BS have been under 130 fasting and < 150 post prandially.  Denies any recent hypoglyemic events.  Taking   medications as directed: Jardiance  . Following a carbohydrate modified diet 6 days per week. Denies numbness, burning and tingling of extremities. Appetite is good.   2) HTN:  Hypertension: patient checks blood pressure  daily  at home.  Readings have been for the most part < 130/80 at rest . Patient is following a reduce salt diet most days and is taking medications as prescribed :  amlodipine 5 mg and telmisartan 80 mg       Outpatient Medications Prior to Visit  Medication Sig Dispense Refill   amLODipine (NORVASC) 2.5 MG tablet Take 1 tablet (2.5 mg total) by mouth daily. 90 tablet 3   Ascorbic Acid (VITAMIN C) 1000 MG tablet Take 1,000 mg by mouth daily.      aspirin EC 81 MG tablet Take 81 mg by mouth at bedtime. Swallow whole.     Calcium Carbonate-Vitamin D3 600-400 MG-UNIT TABS Take 1 tablet by mouth daily.     clobetasol cream (TEMOVATE) 0.05 % Apply 1 application topically 2 (two) times daily as needed (psoriasis).     escitalopram (LEXAPRO) 5 MG tablet Take 1 tablet (5 mg total) by mouth every evening. 90 tablet 3   estradiol (ESTRACE) 0.1 MG/GM vaginal cream APPLY 1 GRAM INTRAVAGINALLY AND TO PERINEUM NIGHTLY X 14 DAYS,  THEN TWICE  WEEKLY THEREAFTER 42.5 g 12   folic acid (FOLVITE) 1 MG tablet Take 1 tablet (1 mg total) by mouth See admin instructions. Takes daily except on Saturday 90 tablet 3   glucose blood (FREESTYLE LITE) test strip Use as instructed to test blood sugar once daily 100 each 3   Infant Care Products (DERMACLOUD) OINT APPLY TO PERINEUM AFTER ESTROGEN CREAM . OK TO REAPPLY AS NEEDED 430 g 2   JARDIANCE 10 MG TABS tablet TAKE 1 TABLET DAILY BEFORE BREAKFAST 90 tablet 3   metFORMIN (GLUCOPHAGE XR) 750 MG 24 hr tablet Take 2 tablets (1,500 mg total) by mouth daily with breakfast. 180 tablet 3   methotrexate (RHEUMATREX) 2.5 MG tablet Take 3 tablets (7.5 mg total) by mouth once a week. Caution:Chemotherapy. Protect from light. 36 tablet 1   montelukast (SINGULAIR) 10 MG tablet Take 1 tablet (10 mg total) by mouth at bedtime. 90 tablet 3   Multiple Vitamin (MULTIVITAMIN) capsule Take 1 capsule by mouth daily.     omeprazole (PRILOSEC) 20 MG capsule Take 1 capsule (20 mg total) by mouth daily. 90 capsule 3   Precision Thin Lancets MISC Use as directed to check sugars twice daily 180 each 3   rosuvastatin (CRESTOR) 10 MG tablet Take 1 tablet (10 mg total) by mouth daily. 90 tablet  3   Semaglutide, 2 MG/DOSE, 8 MG/3ML SOPN Inject 2 mg as directed once a week. 9 mL 3   telmisartan (MICARDIS) 80 MG tablet Take 1 tablet (80 mg total) by mouth daily. 90 tablet 3   vitamin E 400 UNIT capsule Take 400 Units by mouth daily.     loratadine (CLARITIN) 10 MG tablet Take 1 tablet (10 mg total) by mouth daily. 90 tablet 3   doxycycline (VIBRA-TABS) 100 MG tablet Take 1 tablet (100 mg total) by mouth 2 (two) times daily. (Patient not taking: Reported on 06/20/2022) 20 tablet 0   No facility-administered medications prior to visit.    Review of Systems;  Patient denies headache, fevers, malaise, unintentional weight loss, skin rash, eye pain, sinus congestion and sinus pain, sore throat, dysphagia,  hemoptysis , cough,  dyspnea, wheezing, chest pain, palpitations, orthopnea, edema, abdominal pain, nausea, melena, diarrhea, constipation, flank pain, dysuria, hematuria, urinary  Frequency, nocturia, numbness, tingling, seizures,  Focal weakness, Loss of consciousness,  Tremor, insomnia, depression, anxiety, and suicidal ideation.      Objective:  BP 130/62   Pulse 85   Temp 98.1 F (36.7 C) (Oral)   Ht 4\' 10"  (1.473 m)   Wt 120 lb 3.2 oz (54.5 kg)   SpO2 95%   BMI 25.12 kg/m   BP Readings from Last 3 Encounters:  06/20/22 130/62  03/17/22 110/70  12/16/21 90/60    Wt Readings from Last 3 Encounters:  06/20/22 120 lb 3.2 oz (54.5 kg)  03/17/22 119 lb 6.4 oz (54.2 kg)  12/16/21 121 lb 6.4 oz (55.1 kg)    Physical Exam Vitals reviewed.  Constitutional:      General: She is not in acute distress.    Appearance: Normal appearance. She is normal weight. She is not ill-appearing, toxic-appearing or diaphoretic.  HENT:     Head: Normocephalic.  Eyes:     General: No scleral icterus.       Right eye: No discharge.        Left eye: No discharge.     Conjunctiva/sclera: Conjunctivae normal.  Cardiovascular:     Rate and Rhythm: Normal rate and regular rhythm.     Heart sounds: Normal heart sounds.  Pulmonary:     Effort: Pulmonary effort is normal. No respiratory distress.     Breath sounds: Normal breath sounds.  Musculoskeletal:        General: Normal range of motion.  Skin:    General: Skin is warm and dry.  Neurological:     General: No focal deficit present.     Mental Status: She is alert and oriented to person, place, and time. Mental status is at baseline.  Psychiatric:        Mood and Affect: Mood normal.        Behavior: Behavior normal.        Thought Content: Thought content normal.        Judgment: Judgment normal.     Lab Results  Component Value Date   HGBA1C 6.0 06/16/2022   HGBA1C 6.6 (H) 12/13/2021   HGBA1C 6.5 06/14/2021    Lab Results  Component Value Date    CREATININE 0.62 06/16/2022   CREATININE 0.60 12/13/2021   CREATININE 0.63 06/14/2021    Lab Results  Component Value Date   WBC 5.5 06/16/2022   HGB 14.4 06/16/2022   HCT 43.4 06/16/2022   PLT 235.0 06/16/2022   GLUCOSE 114 (H) 06/16/2022   CHOL 136 06/16/2022  TRIG 73.0 06/16/2022   HDL 60.60 06/16/2022   LDLDIRECT 71.0 06/16/2022   LDLCALC 61 06/16/2022   ALT 18 06/16/2022   AST 21 06/16/2022   NA 137 06/16/2022   K 4.3 06/16/2022   CL 99 06/16/2022   CREATININE 0.62 06/16/2022   BUN 12 06/16/2022   CO2 31 06/16/2022   TSH 1.83 06/16/2022   HGBA1C 6.0 06/16/2022   MICROALBUR <0.7 06/16/2022    DG Bone Density  Result Date: 02/23/2022 EXAM: DUAL X-RAY ABSORPTIOMETRY (DXA) FOR BONE MINERAL DENSITY IMPRESSION: Your patient Stephana Hadad completed a BMD test on 02/23/2022 using the [employer] iDXA DXA System (software version: 14.10) manufactured by [employer]. The following summarizes the results of our evaluation. Technologist: SCE PATIENT BIOGRAPHICAL: Name: Charlayne, Kopecky Patient ID:  [id number] Birth Date: 1945-10-05 Height:     58.2 in. Gender:      Female Exam Date:  02/23/2022 Weight:     120.7 lbs. Indications: Advanced Age, Caucasian, Diabetic, Height Loss, History of Fracture (Adult), Hysterectomy, Postmenopausal Fractures: ankle Treatments: calcium w/ vit D, Jardiance, Metformin, methotrexate, Ozempic Injection DENSITOMETRY RESULTS: Site      Region     Measured Date Measured Age WHO Classification Young Adult T-score BMD         %Change vs. Previous Significant Change (*) AP Spine L1-L4 1/23/2021 77.3 Normal -0.6 1.124 g/cm2 1.5% - AP Spine L1-L4 07/13/2015 70.7 Normal -0.7 1.107 g/cm2 - - DualFemur Neck Right 02/23/2022 77.3 Osteopenia -2.0 0.757 g/cm2 -1.2% - DualFemur Neck Right 07/13/2015 70.7 Osteopenia -2.0 0.766 g/cm2 - - DualFemur Total Mean 02/23/2022 77.3 Osteopenia -1.5 0.821 g/cm2 -8.8% Yes DualFemur Total Mean 07/13/2015 70.7 Normal -0.9 0.900  g/cm2 - - ASSESSMENT: The BMD measured at Femur Neck Right is 0.757 g/cm2 with a T-score of -2.0. This patient is considered osteopenic according to World Health Organization [facility]) criteria. The scan quality is good. Compared with prior study, there has been no significant change in the spine. Compared with prior study, there has been a significant decrease in the total hip. World Science writer [facility]) criteria for post-menopausal, Caucasian Women: Normal:                   T-score at or above -1 SD Osteopenia/low bone mass: T-score between -1 and -2.5 SD Osteoporosis:             T-score at or below -2.5 SD RECOMMENDATIONS: 1. All patients should optimize calcium and vitamin D intake. 2. Consider FDA-approved medical therapies in postmenopausal women and men aged 60 years and older, based on the following: a. A hip or vertebral(clinical or morphometric) fracture b. T-score < -2.5 at the femoral neck or spine after appropriate evaluation to exclude secondary causes c. Low bone mass (T-score between -1.0 and -2.5 at the femoral neck or spine) and a 10-year probability of a hip fracture > 3% or a 10-year probability of a major osteoporosis-related fracture > 20% based on the [place]-adapted WHO algorithm 3. Clinician judgment and/or patient preferences may indicate treatment for people with 10-year fracture probabilities above or below these levels FOLLOW-UP: People with diagnosed cases of osteoporosis or at high risk for fracture should have regular bone mineral density tests. For patients eligible for Medicare, routine testing is allowed once every 2 years. The testing frequency can be increased to one year for patients who have rapidly progressing disease, those who are receiving or discontinuing medical therapy to restore bone mass, or have additional risk factors.  I have reviewed this report, and agree with the above findings. [facility] Dear Dr Darrick Huntsman, Your patient Emily Ryan completed a FRAX  assessment on 02/23/2022 using the [facility] iDXA DXA System (analysis version: 14.10) manufactured by [employer]. The following summarizes the results of our evaluation. PATIENT BIOGRAPHICAL: Name: Yanice, Pross Patient ID: [id number] Birth Date: [birth year] Height:    58.2 in. Gender:     [age]    [age]       Weight:    120.7 lbs. Ethnicity:  White                            Exam Date: 02/23/2022 FRAX* RESULTS:  (version: 3.5) 10-year Probability of Fracture1 Major Osteoporotic Fracture2 Hip Fracture 21.8% 5.7% Population: [place] (Caucasian) Risk Factors: History of Fracture (Adult) Based on Femur (Right) Neck BMD 1 -The 10-year probability of fracture may be lower than reported if the patient has received treatment. 2 -Major Osteoporotic Fracture: Clinical Spine, Forearm, Hip or Shoulder *FRAX is a Armed forces logistics/support/administrative officer of the [employer] of [employer] for Metabolic Bone Disease, a World Science writer (WHO) [employer]. ASSESSMENT: The probability of a major osteoporotic fracture is 21.8% within the next ten years. The probability of a hip fracture is 5.7% within the next ten years. Electronically Signed   By: Frederico Hamman M.D.   On: 02/23/2022 10:04    Assessment & Plan:  .DM type 2 with diabetic mixed hyperlipidemia (HCC) Assessment & Plan: Currently well-controlled on current regimen of metformin XR 1500 mg ,  Ozempic 2 mg weekly,  and Jardiance 10 mg daily .  hemoglobin A1c is at goal of less than 7.0 . Patient is still losing weight on  mg ozempic dose , goal is 115 lbs  reminded to schedule an annual eye exam and foot exam is normal today. Patient has no microalbuminuria. Patient is tolerating statin therapy for CAD risk reduction and on ACE/ARB for renal protection and hypertension   Lab Results  Component Value Date   HGBA1C 6.0 06/16/2022   Lab Results  Component Value Date   MICROALBUR <0.7 06/16/2022   MICROALBUR 1.5 06/14/2021    Lab Results   Component Value Date   CHOL 136 06/16/2022   HDL 60.60 06/16/2022   LDLCALC 61 06/16/2022   LDLDIRECT 71.0 06/16/2022   TRIG 73.0 06/16/2022   CHOLHDL 2 06/16/2022     Orders: -     Lipid panel; Future -     LDL cholesterol, direct; Future -     Comprehensive metabolic panel; Future -     Hemoglobin A1c; Future  Mixed hyperlipidemia Assessment & Plan: LDL is 71 and triglycerides are at goal on rosuvastatin . She has no side effects and ALT  is normal.   No changes today. Continue aspirin low dose.  Lab Results  Component Value Date   CHOL 136 06/16/2022   HDL 60.60 06/16/2022   LDLCALC 61 06/16/2022   LDLDIRECT 71.0 06/16/2022   TRIG 73.0 06/16/2022   CHOLHDL 2 06/16/2022   Lab Results  Component Value Date   ALT 18 06/16/2022   AST 21 06/16/2022   ALKPHOS 79 06/16/2022   BILITOT 0.8 06/16/2022       Other orders -     Loratadine; Take 1 tablet (10 mg total) by mouth daily.  Dispense: 90 tablet; Refill: 3 -  Doxycycline Hyclate; Take 1 tablet (100 mg total) by mouth 2 (two) times daily.  Dispense: 20 tablet; Refill: 0     I provided 30 minutes of face-to-face time during this encounter reviewing patient's last visit with me,  recent surgical and non surgical procedures, previous  labs and imaging studies, counseling on currently addressed issues,  and post visit ordering to diagnostics and therapeutics .   Follow-up: Return in about 6 months (around 12/20/2022) for follow up diabetes.   Sherlene Shams, MD

## 2022-06-23 DIAGNOSIS — D2262 Melanocytic nevi of left upper limb, including shoulder: Secondary | ICD-10-CM | POA: Diagnosis not present

## 2022-06-23 DIAGNOSIS — L821 Other seborrheic keratosis: Secondary | ICD-10-CM | POA: Diagnosis not present

## 2022-06-23 DIAGNOSIS — L400 Psoriasis vulgaris: Secondary | ICD-10-CM | POA: Diagnosis not present

## 2022-06-23 DIAGNOSIS — D2271 Melanocytic nevi of right lower limb, including hip: Secondary | ICD-10-CM | POA: Diagnosis not present

## 2022-06-23 DIAGNOSIS — D225 Melanocytic nevi of trunk: Secondary | ICD-10-CM | POA: Diagnosis not present

## 2022-06-23 DIAGNOSIS — D2261 Melanocytic nevi of right upper limb, including shoulder: Secondary | ICD-10-CM | POA: Diagnosis not present

## 2022-06-23 DIAGNOSIS — D2272 Melanocytic nevi of left lower limb, including hip: Secondary | ICD-10-CM | POA: Diagnosis not present

## 2022-08-08 ENCOUNTER — Other Ambulatory Visit: Payer: Self-pay

## 2022-08-08 ENCOUNTER — Encounter: Payer: Self-pay | Admitting: *Deleted

## 2022-08-08 ENCOUNTER — Emergency Department
Admission: EM | Admit: 2022-08-08 | Discharge: 2022-08-08 | Disposition: A | Discharge status: Home / Self Care | Payer: Medicare Other | Attending: Emergency Medicine | Admitting: Emergency Medicine

## 2022-08-08 ENCOUNTER — Emergency Department: Payer: Medicare Other

## 2022-08-08 DIAGNOSIS — M542 Cervicalgia: Secondary | ICD-10-CM | POA: Diagnosis not present

## 2022-08-08 DIAGNOSIS — I10 Essential (primary) hypertension: Secondary | ICD-10-CM | POA: Insufficient documentation

## 2022-08-08 DIAGNOSIS — E119 Type 2 diabetes mellitus without complications: Secondary | ICD-10-CM | POA: Insufficient documentation

## 2022-08-08 DIAGNOSIS — I6523 Occlusion and stenosis of bilateral carotid arteries: Secondary | ICD-10-CM | POA: Diagnosis not present

## 2022-08-08 DIAGNOSIS — S199XXA Unspecified injury of neck, initial encounter: Secondary | ICD-10-CM | POA: Diagnosis not present

## 2022-08-08 DIAGNOSIS — S0990XA Unspecified injury of head, initial encounter: Secondary | ICD-10-CM | POA: Diagnosis not present

## 2022-08-08 DIAGNOSIS — W19XXXA Unspecified fall, initial encounter: Secondary | ICD-10-CM

## 2022-08-08 DIAGNOSIS — W108XXA Fall (on) (from) other stairs and steps, initial encounter: Secondary | ICD-10-CM | POA: Diagnosis not present

## 2022-08-08 DIAGNOSIS — J439 Emphysema, unspecified: Secondary | ICD-10-CM | POA: Diagnosis not present

## 2022-08-08 NOTE — ED Notes (Signed)
Pt escorted to the room via wheelchair by this RN. She notes climbing into her attic and missed a step falling ~ 10 feet backwards hitting her head. Per spouse the pt had some LOC. Lac noted to the posterior aspect of her head - endorses taking 81mg  ASA daily. Pt is A&O x 4 and ambulatory without assistance.

## 2022-08-08 NOTE — ED Triage Notes (Signed)
Pt to triage via wheelchair.  Pt fell from the attic stairs approx 10 feet.  States loc.  Pt has hematoma to back of head with abrasion and bleeding.  Pt denies neck or back pain.  No n/v/d   pt has a headache.    Pt alert  speech clear.

## 2022-08-08 NOTE — ED Notes (Signed)
Pts lac cleaned with flushes. Small lac to the posterior head with some swelling.

## 2022-08-08 NOTE — ED Provider Notes (Signed)
[facility] Provider Note    Event Date/Time   First MD Initiated Contact with Patient 08/08/22 1926     (approximate)   History   Chief Complaint Head Injury   HPI  Emily Ryan is a 77 y.o. female with past medical history of hypertension, hyperlipidemia, and diabetes who presents to the ED following fall.  Patient reports that she was on the pool downstairs leading up into her crawl space when she lost her balance and fell backwards.  She reports that she hit her head on the hardwood floor and lost consciousness briefly.  She now complains of pain to the back of her head and neck, denies any back pain, chest pain, abdominal pain, or extremity pain.  She does not take a blood thinner.     Physical Exam   Triage Vital Signs: ED Triage Vitals  Encounter Vitals Group     BP 08/08/22 1844 (!) 152/83     Systolic BP Percentile --      Diastolic BP Percentile --      Pulse Rate 08/08/22 1844 86     Resp 08/08/22 1844 18     Temp 08/08/22 1844 98.4 F (36.9 C)     Temp Source 08/08/22 1844 Oral     SpO2 08/08/22 1844 93 %     Weight 08/08/22 1842 117 lb (53.1 kg)     Height 08/08/22 1842 4\' 11"  (1.499 m)     Head Circumference --      Peak Flow --      Pain Score 08/08/22 1842 4     Pain Loc --      Pain Education --      Exclude from Growth Chart --     Most recent vital signs: Vitals:   08/08/22 1844  BP: (!) 152/83  Pulse: 86  Resp: 18  Temp: 98.4 F (36.9 C)  SpO2: 93%    Constitutional: Alert and oriented. Eyes: Conjunctivae are normal. Head: Hematoma to posterior scalp with punctate area of bleeding, no laceration noted. Nose: No congestion/rhinnorhea. Mouth/Throat: Mucous membranes are moist.  Neck: No midline cervical spine tenderness to palpation. Cardiovascular: Normal rate, regular rhythm. Grossly normal heart sounds.  2+ radial pulses bilaterally. Respiratory: Normal respiratory effort.  No retractions. Lungs  CTAB. Gastrointestinal: Soft and nontender. No distention. Musculoskeletal: No lower extremity tenderness nor edema.  Neurologic:  Normal speech and language. No gross focal neurologic deficits are appreciated.    ED Results / Procedures / Treatments   Labs (all labs ordered are listed, but only abnormal results are displayed) Labs Reviewed - No data to display  RADIOLOGY CT head reviewed and interpreted by me with no hemorrhage or midline shift.  PROCEDURES:  Critical Care performed: No  Procedures   MEDICATIONS ORDERED IN ED: Medications - No data to display   IMPRESSION / MDM / ASSESSMENT AND PLAN / ED COURSE  I reviewed the triage vital signs and the nursing notes.                              77 y.o. female with past medical history of hypertension, hyperlipidemia, diabetes who presents to the ED after fall from stairs landing on the back of her head with LOC.  Patient's presentation is most consistent with acute complicated illness / injury requiring diagnostic workup.  Differential diagnosis includes, but is not limited to, intracranial hemorrhage, skull fracture, cervical  spine fracture.  Patient nontoxic-appearing and in no acute distress, vital signs are unremarkable.  She has a nonfocal neurologic exam, CT head and cervical spine are negative for acute process.  She has a small area of bleeding to posterior scalp but no laceration in need of repair.  No evidence of injury to her trunk or extremities.  Patient states her tetanus is up to date and she is appropriate for discharge home with outpatient follow-up.  She was counseled to return to the ED for new or worsening symptoms, patient agrees to plan.      FINAL CLINICAL IMPRESSION(S) / ED DIAGNOSES   Final diagnoses:  Fall, initial encounter  Injury of head, initial encounter     Rx / DC Orders   ED Discharge Orders     None        Note:  This document was prepared using Dragon voice  recognition software and may include unintentional dictation errors.   Chesley Noon, MD 08/08/22 2018

## 2022-08-11 ENCOUNTER — Telehealth: Payer: Self-pay | Admitting: *Deleted

## 2022-08-11 NOTE — Transitions of Care (Post Inpatient/ED Visit) (Signed)
   08/11/2022  Name: Emily Ryan MRN: [id number] DOB: [birth year]  Today's TOC FU Call Status: Today's TOC FU Call Status:: Unsuccessful Call (1st Attempt) Unsuccessful Call (1st Attempt) Date: 08/11/22  Attempted to reach the patient regarding the most recent Inpatient/ED visit.  Follow Up Plan: Additional outreach attempts will be made to reach the patient to complete the Transitions of Care (Post Inpatient/ED visit) call.  Gean Maidens BSN RN [facility] [phone]

## 2022-08-12 ENCOUNTER — Telehealth: Payer: Self-pay | Admitting: *Deleted

## 2022-08-12 NOTE — Transitions of Care (Post Inpatient/ED Visit) (Signed)
   08/12/2022  Name: Emily Ryan MRN: [id number] DOB: 1945/04/05  Today's TOC FU Call Status: Today's TOC FU Call Status:: Unsuccessful Call (2nd Attempt) Unsuccessful Call (2nd Attempt) Date: 08/12/22  Attempted to reach the patient regarding the most recent Inpatient/ED visit.  Follow Up Plan: Additional outreach attempts will be made to reach the patient to complete the Transitions of Care (Post Inpatient/ED visit) call.   Gean Maidens BSN RN [facility] [phone]

## 2022-08-15 ENCOUNTER — Telehealth: Payer: Self-pay | Admitting: Internal Medicine

## 2022-08-15 NOTE — Telephone Encounter (Signed)
Placed in red folder  

## 2022-08-15 NOTE — Telephone Encounter (Signed)
Pt came into the office to drop off a letter for the provider. Placed in provider folder

## 2022-08-16 ENCOUNTER — Encounter: Payer: Self-pay | Admitting: Internal Medicine

## 2022-08-17 ENCOUNTER — Telehealth: Payer: Self-pay | Admitting: *Deleted

## 2022-08-17 NOTE — Transitions of Care (Post Inpatient/ED Visit) (Signed)
   08/17/2022  Name: Emily Ryan MRN: [id number] DOB: [birth year]  Today's TOC FU Call Status: Today's TOC FU Call Status:: Unsuccessful Call (3rd Attempt) Unsuccessful Call (3rd Attempt) Date: 08/17/22  Attempted to reach the patient regarding the most recent Inpatient/ED visit.  Follow Up Plan: No further outreach attempts will be made at this time. We have been unable to contact the patient.  Gean Maidens BSN RN [facility] [phone]

## 2022-09-02 ENCOUNTER — Other Ambulatory Visit: Payer: Self-pay | Admitting: Medical Genetics

## 2022-09-02 DIAGNOSIS — Z006 Encounter for examination for normal comparison and control in clinical research program: Secondary | ICD-10-CM

## 2022-09-14 ENCOUNTER — Ambulatory Visit (INDEPENDENT_AMBULATORY_CARE_PROVIDER_SITE_OTHER): Payer: Medicare Other

## 2022-09-14 DIAGNOSIS — Z23 Encounter for immunization: Secondary | ICD-10-CM

## 2022-11-02 ENCOUNTER — Encounter: Payer: Self-pay | Admitting: Internal Medicine

## 2022-11-02 MED ORDER — METFORMIN HCL ER 750 MG PO TB24: 1500.0000 mg | ORAL_TABLET | Freq: Every day | ORAL | 3 refills | Status: DC

## 2022-11-02 MED ORDER — MONTELUKAST SODIUM 10 MG PO TABS: 10.0000 mg | ORAL_TABLET | Freq: Every day | ORAL | 3 refills | Status: DC

## 2022-11-10 ENCOUNTER — Other Ambulatory Visit
Admission: RE | Admit: 2022-11-10 | Discharge: 2022-11-10 | Disposition: A | Discharge status: Home / Self Care | Payer: Medicare Other | Source: Ambulatory Visit | Attending: Medical Genetics | Admitting: Medical Genetics

## 2022-11-10 DIAGNOSIS — Z006 Encounter for examination for normal comparison and control in clinical research program: Secondary | ICD-10-CM | POA: Insufficient documentation

## 2022-11-22 LAB — GENECONNECT MOLECULAR SCREEN

## 2022-11-22 LAB — HELIX MOLECULAR SCREEN: Genetic Analysis Overall Interpretation: NEGATIVE

## 2022-12-16 ENCOUNTER — Other Ambulatory Visit (INDEPENDENT_AMBULATORY_CARE_PROVIDER_SITE_OTHER): Payer: Medicare Other

## 2022-12-16 DIAGNOSIS — E782 Mixed hyperlipidemia: Secondary | ICD-10-CM | POA: Diagnosis not present

## 2022-12-16 DIAGNOSIS — E1169 Type 2 diabetes mellitus with other specified complication: Secondary | ICD-10-CM

## 2022-12-16 LAB — COMPREHENSIVE METABOLIC PANEL
ALT: 17 U/L (ref 0–35)
AST: 19 U/L (ref 0–37)
Albumin: 3.9 g/dL (ref 3.5–5.2)
Alkaline Phosphatase: 78 U/L (ref 39–117)
BUN: 11 mg/dL (ref 6–23)
CO2: 31 meq/L (ref 19–32)
Calcium: 8.5 mg/dL (ref 8.4–10.5)
Chloride: 103 meq/L (ref 96–112)
Creatinine, Ser: 0.64 mg/dL (ref 0.40–1.20)
GFR: 84.82 mL/min (ref >60.00)
Glucose, Bld: 96 mg/dL (ref 70–99)
Potassium: 4.2 meq/L (ref 3.5–5.1)
Sodium: 141 meq/L (ref 135–145)
Total Bilirubin: 0.7 mg/dL (ref 0.2–1.2)
Total Protein: 6.5 g/dL (ref 6.0–8.3)

## 2022-12-16 LAB — LIPID PANEL
Cholesterol: 122 mg/dL (ref 0–200)
HDL: 50.6 mg/dL (ref >39.00)
LDL Cholesterol: 54 mg/dL (ref 0–99)
NonHDL: 71.17
Total CHOL/HDL Ratio: 2
Triglycerides: 87 mg/dL (ref 0.0–149.0)
VLDL: 17.4 mg/dL (ref 0.0–40.0)

## 2022-12-16 LAB — LDL CHOLESTEROL, DIRECT: Direct LDL: 58 mg/dL

## 2022-12-16 LAB — HEMOGLOBIN A1C: Hgb A1c MFr Bld: 6.2 % (ref 4.6–6.5)

## 2022-12-21 ENCOUNTER — Ambulatory Visit: Payer: Medicare Other | Admitting: Internal Medicine

## 2022-12-21 ENCOUNTER — Encounter: Payer: Self-pay | Admitting: Internal Medicine

## 2022-12-21 VITALS — BP 130/68 | HR 85 | Ht 59.0 in | Wt 121.4 lb

## 2022-12-21 DIAGNOSIS — E1169 Type 2 diabetes mellitus with other specified complication: Secondary | ICD-10-CM | POA: Diagnosis not present

## 2022-12-21 DIAGNOSIS — Z7985 Long-term (current) use of injectable non-insulin antidiabetic drugs: Secondary | ICD-10-CM | POA: Diagnosis not present

## 2022-12-21 DIAGNOSIS — E782 Mixed hyperlipidemia: Secondary | ICD-10-CM

## 2022-12-21 DIAGNOSIS — E119 Type 2 diabetes mellitus without complications: Secondary | ICD-10-CM

## 2022-12-21 DIAGNOSIS — R296 Repeated falls: Secondary | ICD-10-CM | POA: Diagnosis not present

## 2022-12-21 DIAGNOSIS — Z1231 Encounter for screening mammogram for malignant neoplasm of breast: Secondary | ICD-10-CM

## 2022-12-21 MED ORDER — ESTRADIOL 0.1 MG/GM VA CREA: TOPICAL_CREAM | VAGINAL | 12 refills | Status: AC

## 2022-12-21 MED ORDER — OMEPRAZOLE 20 MG PO CPDR: 20.0000 mg | DELAYED_RELEASE_CAPSULE | Freq: Every day | ORAL | 3 refills | Status: DC

## 2022-12-21 MED ORDER — ESCITALOPRAM OXALATE 5 MG PO TABS: 5.0000 mg | ORAL_TABLET | Freq: Every evening | ORAL | 3 refills | Status: DC

## 2022-12-21 MED ORDER — FOLIC ACID 1 MG PO TABS: 1.0000 mg | ORAL_TABLET | ORAL | 3 refills | Status: AC

## 2022-12-21 MED ORDER — TELMISARTAN 80 MG PO TABS: 80.0000 mg | ORAL_TABLET | Freq: Every day | ORAL | 3 refills | Status: DC

## 2022-12-21 NOTE — Patient Instructions (Addendum)
YOUR MAMMOGRAM IS DUE in January, PLEASE CALL AND GET THIS SCHEDULED! [facility] - call [phone]   Your diabetes remains under excellent control  And your cholesterol and other labs are also normal. Please continue your current medications. return in 6 months for follow up on diabetes and make sure you are seeing your eye doctor at least once a year.     Merry Christmas  Try this cranberry "simple syrup"for your Christmas cocktails:  Simmer  a 32 ounces of 100% cranberry juice with juice of 3 oranges and their zest, ALONG WITH  grated ginger root,  until it has reduced by about 25% .  Strain it once cooled, and add one shot of liquor 43 and one shot of apple brandy. this will keep in the refrigerator for 2-3 weeks and makes EVERY COCKTAIL BETTER.  MAKES A GREAT CHRISTMAS MIMOSA

## 2022-12-21 NOTE — Progress Notes (Unsigned)
Subjective:  Patient ID: Emily Ryan, female    DOB: 1945/05/14  Age: 77 y.o. MRN: [id number]  CC: The primary encounter diagnosis was Encounter for screening mammogram for malignant neoplasm of breast. Diagnoses of DM type 2 with diabetic mixed hyperlipidemia (HCC), Recurrent falls, and Diabetes mellitus treated with injections of non-insulin medication (HCC) were also pertinent to this visit.   HPI Emily Ryan presents for  Chief Complaint  Patient presents with   Medical Management of Chronic Issues    6 month follow up     Reviewed August fall with LOC resulting in CT head/spine. BURR HOLES MENTIONED,  WHICH IS INACCURATE.  SHE HAS NO HISTORY OF CRANIOTOMY OR TRAUMA TO SKULL SURGERY.  SHE RECALLS THAT HER  FONTANELLES NEVER COMPLETLEY CLOSED . Had another fall  in November due to trig[iing over something she left in her path iabhi   She feels generally well, is exercising several times per week and checking blood sugars once daily at variable times.  BS have been under 130 fasting and < 150 post prandially.  Denies any recent hypoglyemic events.  Taking his medications as directed. Following a carbohydrate modified diet 6 days per week. Denies numbness, burning and tingling of extremities. Appetite is good.       Outpatient Medications Prior to Visit  Medication Sig Dispense Refill   amLODipine (NORVASC) 2.5 MG tablet Take 1 tablet (2.5 mg total) by mouth daily. 90 tablet 3   Ascorbic Acid (VITAMIN C) 1000 MG tablet Take 1,000 mg by mouth daily.      aspirin EC 81 MG tablet Take 81 mg by mouth at bedtime. Swallow whole.     Calcium Carbonate-Vitamin D3 600-400 MG-UNIT TABS Take 1 tablet by mouth daily.     clobetasol cream (TEMOVATE) 0.05 % Apply 1 application topically 2 (two) times daily as needed (psoriasis).     glucose blood (FREESTYLE LITE) test strip Use as instructed to test blood sugar once daily 100 each 3   Infant Care Products (DERMACLOUD) OINT APPLY TO  PERINEUM AFTER ESTROGEN CREAM . OK TO REAPPLY AS NEEDED 430 g 2   JARDIANCE 10 MG TABS tablet TAKE 1 TABLET DAILY BEFORE BREAKFAST 90 tablet 3   loratadine (CLARITIN) 10 MG tablet Take 1 tablet (10 mg total) by mouth daily. 90 tablet 3   metFORMIN (GLUCOPHAGE XR) 750 MG 24 hr tablet Take 2 tablets (1,500 mg total) by mouth daily with breakfast. 180 tablet 3   methotrexate (RHEUMATREX) 2.5 MG tablet Take 3 tablets (7.5 mg total) by mouth once a week. Caution:Chemotherapy. Protect from light. 36 tablet 1   montelukast (SINGULAIR) 10 MG tablet Take 1 tablet (10 mg total) by mouth at bedtime. 90 tablet 3   Multiple Vitamin (MULTIVITAMIN) capsule Take 1 capsule by mouth daily.     Precision Thin Lancets MISC Use as directed to check sugars twice daily 180 each 3   rosuvastatin (CRESTOR) 10 MG tablet Take 1 tablet (10 mg total) by mouth daily. 90 tablet 3   Semaglutide, 2 MG/DOSE, 8 MG/3ML SOPN Inject 2 mg as directed once a week. 9 mL 3   vitamin E 400 UNIT capsule Take 400 Units by mouth daily.     escitalopram (LEXAPRO) 5 MG tablet Take 1 tablet (5 mg total) by mouth every evening. 90 tablet 3   estradiol (ESTRACE) 0.1 MG/GM vaginal cream APPLY 1 GRAM INTRAVAGINALLY AND TO PERINEUM NIGHTLY X 14 DAYS,  THEN TWICE WEEKLY THEREAFTER 42.5  g 12   folic acid (FOLVITE) 1 MG tablet Take 1 tablet (1 mg total) by mouth See admin instructions. Takes daily except on Saturday 90 tablet 3   omeprazole (PRILOSEC) 20 MG capsule Take 1 capsule (20 mg total) by mouth daily. 90 capsule 3   telmisartan (MICARDIS) 80 MG tablet Take 1 tablet (80 mg total) by mouth daily. 90 tablet 3   doxycycline (VIBRA-TABS) 100 MG tablet Take 1 tablet (100 mg total) by mouth 2 (two) times daily. (Patient not taking: Reported on 12/21/2022) 20 tablet 0   No facility-administered medications prior to visit.    Review of Systems;  Patient denies headache, fevers, malaise, unintentional weight loss, skin rash, eye pain, sinus congestion  and sinus pain, sore throat, dysphagia,  hemoptysis , cough, dyspnea, wheezing, chest pain, palpitations, orthopnea, edema, abdominal pain, nausea, melena, diarrhea, constipation, flank pain, dysuria, hematuria, urinary  Frequency, nocturia, numbness, tingling, seizures,  Focal weakness, Loss of consciousness,  Tremor, insomnia, depression, anxiety, and suicidal ideation.      Objective:  BP 130/68   Pulse 85   Ht 4\' 11"  (1.499 m)   Wt 121 lb 6.4 oz (55.1 kg)   SpO2 96%   BMI 24.52 kg/m   BP Readings from Last 3 Encounters:  12/21/22 130/68  08/08/22 136/71  06/20/22 130/62    Wt Readings from Last 3 Encounters:  12/21/22 121 lb 6.4 oz (55.1 kg)  08/08/22 117 lb (53.1 kg)  06/20/22 120 lb 3.2 oz (54.5 kg)    Physical Exam Vitals reviewed.  Constitutional:      General: She is not in acute distress.    Appearance: Normal appearance. She is normal weight. She is not ill-appearing, toxic-appearing or diaphoretic.  HENT:     Head: Normocephalic.  Eyes:     General: No scleral icterus.       Right eye: No discharge.        Left eye: No discharge.     Conjunctiva/sclera: Conjunctivae normal.  Cardiovascular:     Rate and Rhythm: Normal rate and regular rhythm.     Heart sounds: Normal heart sounds.  Pulmonary:     Effort: Pulmonary effort is normal. No respiratory distress.     Breath sounds: Normal breath sounds.  Musculoskeletal:        General: Normal range of motion.  Skin:    General: Skin is warm and dry.  Neurological:     General: No focal deficit present.     Mental Status: She is alert and oriented to person, place, and time. Mental status is at baseline.  Psychiatric:        Mood and Affect: Mood normal.        Behavior: Behavior normal.        Thought Content: Thought content normal.        Judgment: Judgment normal.    Lab Results  Component Value Date   HGBA1C 6.2 12/16/2022   HGBA1C 6.0 06/16/2022   HGBA1C 6.6 (H) 12/13/2021    Lab Results   Component Value Date   CREATININE 0.64 12/16/2022   CREATININE 0.62 06/16/2022   CREATININE 0.60 12/13/2021    Lab Results  Component Value Date   WBC 5.5 06/16/2022   HGB 14.4 06/16/2022   HCT 43.4 06/16/2022   PLT 235.0 06/16/2022   GLUCOSE 96 12/16/2022   CHOL 122 12/16/2022   TRIG 87.0 12/16/2022   HDL 50.60 12/16/2022   LDLDIRECT 58.0 12/16/2022   LDLCALC 54  12/16/2022   ALT 17 12/16/2022   AST 19 12/16/2022   NA 141 12/16/2022   K 4.2 12/16/2022   CL 103 12/16/2022   CREATININE 0.64 12/16/2022   BUN 11 12/16/2022   CO2 31 12/16/2022   TSH 1.83 06/16/2022   HGBA1C 6.2 12/16/2022   MICROALBUR <0.7 06/16/2022    No results found.  Assessment & Plan:  .Encounter for screening mammogram for malignant neoplasm of breast -     3D Screening Mammogram, Left and Right; Future  DM type 2 with diabetic mixed hyperlipidemia (HCC) Assessment & Plan: Currently well-controlled on current regimen of metformin XR 1500 mg ,  Ozempic 2 mg weekly,  and Jardiance 10 mg daily .  hemoglobin A1c is at goal of less than 7.0 . Patient is still losing weight on  mg ozempic dose , goal is 115 lbs  reminded to schedule an annual eye exam and foot exam is normal today. Patient has no microalbuminuria. Patient is tolerating statin therapy for CAD risk reduction and on ACE/ARB for renal protection and hypertension   Lab Results  Component Value Date   HGBA1C 6.2 12/16/2022   Lab Results  Component Value Date   MICROALBUR <0.7 06/16/2022   MICROALBUR 1.5 06/14/2021    Lab Results  Component Value Date   CHOL 122 12/16/2022   HDL 50.60 12/16/2022   LDLCALC 54 12/16/2022   LDLDIRECT 58.0 12/16/2022   TRIG 87.0 12/16/2022   CHOLHDL 2 12/16/2022     Orders: -     Telmisartan; Take 1 tablet (80 mg total) by mouth daily.  Dispense: 90 tablet; Refill: 3 -     Hemoglobin A1c; Future -     Comprehensive metabolic panel; Future -     Microalbumin / creatinine urine ratio; Future -      Lipid panel; Future  Recurrent falls Assessment & Plan: Reviewed each fall .neurologic exam is normal. No deficits noted .  Cautioned to slow down and avoid use of ladders unsupervised    Diabetes mellitus treated with injections of non-insulin medication (HCC) Assessment & Plan: Continue Ozempic    Other orders -     Escitalopram Oxalate; Take 1 tablet (5 mg total) by mouth every evening.  Dispense: 90 tablet; Refill: 3 -     Estradiol; APPLY 1 GRAM INTRAVAGINALLY AND TO PERINEUM NIGHTLY X 14 DAYS,  THEN TWICE WEEKLY THEREAFTER  Dispense: 42.5 g; Refill: 12 -     Folic Acid; Take 1 tablet (1 mg total) by mouth See admin instructions. Takes daily except on Saturday  Dispense: 90 tablet; Refill: 3 -     Omeprazole; Take 1 capsule (20 mg total) by mouth daily.  Dispense: 90 capsule; Refill: 3     Follow-up: Return in about 6 months (around 06/21/2023) for follow up diabetes.   Sherlene Shams, MD

## 2022-12-22 NOTE — Assessment & Plan Note (Signed)
Continue Ozempic.

## 2022-12-22 NOTE — Assessment & Plan Note (Signed)
Currently well-controlled on current regimen of metformin XR 1500 mg ,  Ozempic 2 mg weekly,  and Jardiance 10 mg daily .  hemoglobin A1c is at goal of less than 7.0 . Patient is still losing weight on  mg ozempic dose , goal is 115 lbs  reminded to schedule an annual eye exam and foot exam is normal today. Patient has no microalbuminuria. Patient is tolerating statin therapy for CAD risk reduction and on ACE/ARB for renal protection and hypertension   Lab Results  Component Value Date   HGBA1C 6.2 12/16/2022   Lab Results  Component Value Date   MICROALBUR <0.7 06/16/2022   MICROALBUR 1.5 06/14/2021    Lab Results  Component Value Date   CHOL 122 12/16/2022   HDL 50.60 12/16/2022   LDLCALC 54 12/16/2022   LDLDIRECT 58.0 12/16/2022   TRIG 87.0 12/16/2022   CHOLHDL 2 12/16/2022

## 2022-12-22 NOTE — Assessment & Plan Note (Signed)
Reviewed each fall .neurologic exam is normal. No deficits noted .  Cautioned to slow down and avoid use of ladders unsupervised

## 2023-01-02 ENCOUNTER — Other Ambulatory Visit: Payer: Self-pay | Admitting: Internal Medicine

## 2023-01-02 MED ORDER — CIPROFLOXACIN HCL 250 MG PO TABS: 250.0000 mg | ORAL_TABLET | Freq: Two times a day (BID) | ORAL | 0 refills | Status: AC

## 2023-01-02 MED ORDER — AZITHROMYCIN 500 MG PO TABS: 500.0000 mg | ORAL_TABLET | Freq: Every day | ORAL | 0 refills | Status: DC

## 2023-01-05 DIAGNOSIS — D2272 Melanocytic nevi of left lower limb, including hip: Secondary | ICD-10-CM | POA: Diagnosis not present

## 2023-01-05 DIAGNOSIS — D2261 Melanocytic nevi of right upper limb, including shoulder: Secondary | ICD-10-CM | POA: Diagnosis not present

## 2023-01-05 DIAGNOSIS — D485 Neoplasm of uncertain behavior of skin: Secondary | ICD-10-CM | POA: Diagnosis not present

## 2023-01-05 DIAGNOSIS — C44712 Basal cell carcinoma of skin of right lower limb, including hip: Secondary | ICD-10-CM | POA: Diagnosis not present

## 2023-01-05 DIAGNOSIS — L400 Psoriasis vulgaris: Secondary | ICD-10-CM | POA: Diagnosis not present

## 2023-01-05 DIAGNOSIS — Z79899 Other long term (current) drug therapy: Secondary | ICD-10-CM | POA: Diagnosis not present

## 2023-01-05 DIAGNOSIS — D225 Melanocytic nevi of trunk: Secondary | ICD-10-CM | POA: Diagnosis not present

## 2023-01-05 DIAGNOSIS — D2271 Melanocytic nevi of right lower limb, including hip: Secondary | ICD-10-CM | POA: Diagnosis not present

## 2023-01-13 DIAGNOSIS — Z79899 Other long term (current) drug therapy: Secondary | ICD-10-CM | POA: Diagnosis not present

## 2023-01-19 DIAGNOSIS — C44712 Basal cell carcinoma of skin of right lower limb, including hip: Secondary | ICD-10-CM | POA: Diagnosis not present

## 2023-01-23 ENCOUNTER — Ambulatory Visit
Admission: RE | Admit: 2023-01-23 | Discharge: 2023-01-23 | Disposition: A | Discharge status: Home / Self Care | Payer: Medicare Other | Source: Ambulatory Visit | Attending: Internal Medicine | Admitting: Internal Medicine

## 2023-01-23 ENCOUNTER — Other Ambulatory Visit: Payer: Self-pay | Admitting: Internal Medicine

## 2023-01-23 DIAGNOSIS — Z1231 Encounter for screening mammogram for malignant neoplasm of breast: Secondary | ICD-10-CM | POA: Insufficient documentation

## 2023-01-25 ENCOUNTER — Encounter: Payer: Self-pay | Admitting: Internal Medicine

## 2023-01-25 ENCOUNTER — Other Ambulatory Visit: Payer: Self-pay | Admitting: Internal Medicine

## 2023-01-25 DIAGNOSIS — R928 Other abnormal and inconclusive findings on diagnostic imaging of breast: Secondary | ICD-10-CM

## 2023-01-26 ENCOUNTER — Ambulatory Visit
Admission: RE | Admit: 2023-01-26 | Discharge: 2023-01-26 | Disposition: A | Discharge status: Home / Self Care | Payer: Medicare Other | Source: Ambulatory Visit | Attending: Internal Medicine | Admitting: Internal Medicine

## 2023-01-26 ENCOUNTER — Other Ambulatory Visit: Payer: Self-pay | Admitting: Internal Medicine

## 2023-01-26 DIAGNOSIS — N6311 Unspecified lump in the right breast, upper outer quadrant: Secondary | ICD-10-CM | POA: Diagnosis not present

## 2023-01-26 DIAGNOSIS — N63 Unspecified lump in unspecified breast: Secondary | ICD-10-CM

## 2023-01-26 DIAGNOSIS — R92321 Mammographic fibroglandular density, right breast: Secondary | ICD-10-CM | POA: Diagnosis not present

## 2023-01-26 DIAGNOSIS — R928 Other abnormal and inconclusive findings on diagnostic imaging of breast: Secondary | ICD-10-CM

## 2023-01-28 ENCOUNTER — Encounter: Payer: Self-pay | Admitting: Internal Medicine

## 2023-02-06 ENCOUNTER — Ambulatory Visit
Admission: RE | Admit: 2023-02-06 | Discharge: 2023-02-06 | Disposition: A | Discharge status: Home / Self Care | Payer: Medicare Other | Source: Ambulatory Visit | Attending: Internal Medicine | Admitting: Internal Medicine

## 2023-02-06 DIAGNOSIS — R928 Other abnormal and inconclusive findings on diagnostic imaging of breast: Secondary | ICD-10-CM | POA: Diagnosis not present

## 2023-02-06 DIAGNOSIS — N641 Fat necrosis of breast: Secondary | ICD-10-CM | POA: Insufficient documentation

## 2023-02-06 DIAGNOSIS — E119 Type 2 diabetes mellitus without complications: Secondary | ICD-10-CM | POA: Diagnosis not present

## 2023-02-06 DIAGNOSIS — N63 Unspecified lump in unspecified breast: Secondary | ICD-10-CM | POA: Insufficient documentation

## 2023-02-06 DIAGNOSIS — H2513 Age-related nuclear cataract, bilateral: Secondary | ICD-10-CM | POA: Diagnosis not present

## 2023-02-06 DIAGNOSIS — N6311 Unspecified lump in the right breast, upper outer quadrant: Secondary | ICD-10-CM | POA: Diagnosis not present

## 2023-02-06 HISTORY — PX: BREAST BIOPSY: SHX20

## 2023-02-06 LAB — HM DIABETES EYE EXAM

## 2023-02-06 MED ORDER — LIDOCAINE-EPINEPHRINE 1 %-1:100000 IJ SOLN
10.0000 mL | Freq: Once | INTRAMUSCULAR | Status: AC
  Administered 2023-02-06: 10 mL
  Filled 2023-02-06: qty 10

## 2023-02-06 MED ORDER — LIDOCAINE 1 % OPTIME INJ - NO CHARGE
5.0000 mL | Freq: Once | INTRAMUSCULAR | Status: AC
  Administered 2023-02-06: 5 mL
  Filled 2023-02-06: qty 6

## 2023-02-07 LAB — SURGICAL PATHOLOGY

## 2023-02-08 ENCOUNTER — Encounter: Payer: Self-pay | Admitting: Internal Medicine

## 2023-03-22 ENCOUNTER — Encounter: Payer: Self-pay | Admitting: Internal Medicine

## 2023-06-05 DIAGNOSIS — M7062 Trochanteric bursitis, left hip: Secondary | ICD-10-CM | POA: Diagnosis not present

## 2023-06-05 DIAGNOSIS — M47816 Spondylosis without myelopathy or radiculopathy, lumbar region: Secondary | ICD-10-CM | POA: Diagnosis not present

## 2023-06-05 DIAGNOSIS — M1612 Unilateral primary osteoarthritis, left hip: Secondary | ICD-10-CM | POA: Diagnosis not present

## 2023-06-05 DIAGNOSIS — M79605 Pain in left leg: Secondary | ICD-10-CM | POA: Diagnosis not present

## 2023-06-09 DIAGNOSIS — M25552 Pain in left hip: Secondary | ICD-10-CM | POA: Diagnosis not present

## 2023-06-09 DIAGNOSIS — M6281 Muscle weakness (generalized): Secondary | ICD-10-CM | POA: Diagnosis not present

## 2023-06-12 DIAGNOSIS — M25552 Pain in left hip: Secondary | ICD-10-CM | POA: Diagnosis not present

## 2023-06-12 DIAGNOSIS — M6281 Muscle weakness (generalized): Secondary | ICD-10-CM | POA: Diagnosis not present

## 2023-06-14 DIAGNOSIS — M25552 Pain in left hip: Secondary | ICD-10-CM | POA: Diagnosis not present

## 2023-06-14 DIAGNOSIS — M6281 Muscle weakness (generalized): Secondary | ICD-10-CM | POA: Diagnosis not present

## 2023-06-21 ENCOUNTER — Other Ambulatory Visit: Payer: Medicare Other

## 2023-06-21 DIAGNOSIS — E782 Mixed hyperlipidemia: Secondary | ICD-10-CM

## 2023-06-21 DIAGNOSIS — E1169 Type 2 diabetes mellitus with other specified complication: Secondary | ICD-10-CM

## 2023-06-21 DIAGNOSIS — M6281 Muscle weakness (generalized): Secondary | ICD-10-CM | POA: Diagnosis not present

## 2023-06-21 DIAGNOSIS — M25552 Pain in left hip: Secondary | ICD-10-CM | POA: Diagnosis not present

## 2023-06-21 LAB — LIPID PANEL
Cholesterol: 152 mg/dL (ref 0–200)
HDL: 64.7 mg/dL (ref >39.00)
LDL Cholesterol: 71 mg/dL (ref 0–99)
NonHDL: 87.32
Total CHOL/HDL Ratio: 2
Triglycerides: 81 mg/dL (ref 0.0–149.0)
VLDL: 16.2 mg/dL (ref 0.0–40.0)

## 2023-06-21 LAB — COMPREHENSIVE METABOLIC PANEL WITH GFR
ALT: 21 U/L (ref 0–35)
AST: 23 U/L (ref 0–37)
Albumin: 4.2 g/dL (ref 3.5–5.2)
Alkaline Phosphatase: 71 U/L (ref 39–117)
BUN: 17 mg/dL (ref 6–23)
CO2: 28 meq/L (ref 19–32)
Calcium: 8.8 mg/dL (ref 8.4–10.5)
Chloride: 103 meq/L (ref 96–112)
Creatinine, Ser: 0.58 mg/dL (ref 0.40–1.20)
GFR: 86.54 mL/min (ref >60.00)
Glucose, Bld: 119 mg/dL — ABNORMAL HIGH (ref 70–99)
Potassium: 4.2 meq/L (ref 3.5–5.1)
Sodium: 136 meq/L (ref 135–145)
Total Bilirubin: 0.9 mg/dL (ref 0.2–1.2)
Total Protein: 6.8 g/dL (ref 6.0–8.3)

## 2023-06-21 LAB — HEMOGLOBIN A1C: Hgb A1c MFr Bld: 6.3 % (ref 4.6–6.5)

## 2023-06-21 LAB — MICROALBUMIN / CREATININE URINE RATIO
Creatinine,U: 107.3 mg/dL
Microalb Creat Ratio: 12.2 mg/g (ref 0.0–30.0)
Microalb, Ur: 1.3 mg/dL (ref 0.0–1.9)

## 2023-06-23 ENCOUNTER — Encounter: Payer: Self-pay | Admitting: Internal Medicine

## 2023-06-23 ENCOUNTER — Ambulatory Visit (INDEPENDENT_AMBULATORY_CARE_PROVIDER_SITE_OTHER): Payer: Medicare Other | Admitting: Internal Medicine

## 2023-06-23 VITALS — BP 130/74 | HR 84 | Ht 59.0 in | Wt 122.6 lb

## 2023-06-23 DIAGNOSIS — E785 Hyperlipidemia, unspecified: Secondary | ICD-10-CM

## 2023-06-23 DIAGNOSIS — E1169 Type 2 diabetes mellitus with other specified complication: Secondary | ICD-10-CM | POA: Diagnosis not present

## 2023-06-23 DIAGNOSIS — Z7985 Long-term (current) use of injectable non-insulin antidiabetic drugs: Secondary | ICD-10-CM | POA: Diagnosis not present

## 2023-06-23 DIAGNOSIS — L404 Guttate psoriasis: Secondary | ICD-10-CM

## 2023-06-23 DIAGNOSIS — E119 Type 2 diabetes mellitus without complications: Secondary | ICD-10-CM | POA: Diagnosis not present

## 2023-06-23 DIAGNOSIS — G8929 Other chronic pain: Secondary | ICD-10-CM

## 2023-06-23 DIAGNOSIS — E782 Mixed hyperlipidemia: Secondary | ICD-10-CM | POA: Diagnosis not present

## 2023-06-23 DIAGNOSIS — R5383 Other fatigue: Secondary | ICD-10-CM

## 2023-06-23 DIAGNOSIS — I10 Essential (primary) hypertension: Secondary | ICD-10-CM | POA: Diagnosis not present

## 2023-06-23 DIAGNOSIS — M25552 Pain in left hip: Secondary | ICD-10-CM

## 2023-06-23 MED ORDER — FREESTYLE LITE TEST VI STRP: ORAL_STRIP | 3 refills | Status: AC

## 2023-06-23 MED ORDER — LORATADINE 10 MG PO TABS: 10.0000 mg | ORAL_TABLET | Freq: Every day | ORAL | 3 refills | Status: AC

## 2023-06-23 MED ORDER — ROSUVASTATIN CALCIUM 10 MG PO TABS: 10.0000 mg | ORAL_TABLET | Freq: Every day | ORAL | 3 refills | Status: AC

## 2023-06-23 MED ORDER — AMLODIPINE BESYLATE 2.5 MG PO TABS: 2.5000 mg | ORAL_TABLET | Freq: Every day | ORAL | 3 refills | Status: AC

## 2023-06-23 MED ORDER — TELMISARTAN 80 MG PO TABS: 80.0000 mg | ORAL_TABLET | Freq: Every day | ORAL | 3 refills | Status: AC

## 2023-06-23 MED ORDER — OMEPRAZOLE 20 MG PO CPDR: 20.0000 mg | DELAYED_RELEASE_CAPSULE | Freq: Every day | ORAL | 3 refills | Status: AC

## 2023-06-23 MED ORDER — DOXYCYCLINE HYCLATE 100 MG PO TABS: 100.0000 mg | ORAL_TABLET | Freq: Two times a day (BID) | ORAL | 0 refills | Status: DC

## 2023-06-23 MED ORDER — ESCITALOPRAM OXALATE 5 MG PO TABS: 5.0000 mg | ORAL_TABLET | Freq: Every evening | ORAL | 3 refills | Status: AC

## 2023-06-23 MED ORDER — SEMAGLUTIDE (2 MG/DOSE) 8 MG/3ML ~~LOC~~ SOPN: 2.0000 mg | PEN_INJECTOR | SUBCUTANEOUS | 11 refills | Status: AC

## 2023-06-23 NOTE — Progress Notes (Unsigned)
 Subjective:  Patient ID: Emily Ryan, female    DOB: 03-04-1945  Age: 78 y.o. MRN: [id number]  CC: The primary encounter diagnosis was Other fatigue. Diagnoses of Hyperlipidemia associated with type 2 diabetes mellitus (HCC), DM type 2 with diabetic mixed hyperlipidemia (HCC), Diabetes mellitus treated with injections of non-insulin  medication (HCC), Guttate psoriasis, White coat syndrome with diagnosis of hypertension, and Chronic left hip pain were also pertinent to this visit.   HPI JEZABELLA SCHRIEVER presents for  Chief Complaint  Patient presents with   Medical Management of Chronic Issues    6 month follow up    1) left hip pain:  she has been taking celebrex since June 5 for left hip OA /bursitis  and going to PT at [facility].  Pain is still problematic,  present by end of day , relieved only by lying down.  Scheduled for Ortho follow up in July   2) recent tick bite right medial thigh  found and removed on June 12 but possible retained mouth. Taking doxycucline   3) type 2 DM: she feels generally well, is exercising daily and checking blood sugars once daily at variable times.  BS have been under 130 fasting and < 150 post prandially.  Had a recent  hypoglyemic event in mid afternoon .  Taking Ozempic , Jardiance  and metformin   as directed. Following a carbohydrate modified diet 6 days per week. Denies numbness, burning and tingling of extremities. Appetite is diminisjed.       Outpatient Medications Prior to Visit  Medication Sig Dispense Refill   Ascorbic Acid (VITAMIN C) 1000 MG tablet Take 1,000 mg by mouth daily.      aspirin  EC 81 MG tablet Take 81 mg by mouth at bedtime. Swallow whole.     azithromycin  (ZITHROMAX ) 500 MG tablet Take 1 tablet (500 mg total) by mouth daily. For respiratory infection 7 tablet 0   Calcium  Carbonate-Vitamin D3 600-400 MG-UNIT TABS Take 1 tablet by mouth daily.     celecoxib (CELEBREX) 200 MG capsule Take 200 mg by mouth 2 (two) times daily.      clobetasol cream (TEMOVATE) 0.05 % Apply 1 application topically 2 (two) times daily as needed (psoriasis).     estradiol  (ESTRACE ) 0.1 MG/GM vaginal cream APPLY 1 GRAM INTRAVAGINALLY AND TO PERINEUM NIGHTLY X 14 DAYS,  THEN TWICE WEEKLY THEREAFTER 42.5 g 12   folic acid  (FOLVITE ) 1 MG tablet Take 1 tablet (1 mg total) by mouth See admin instructions. Takes daily except on Saturday 90 tablet 3   Infant Care Products (DERMACLOUD) OINT APPLY TO PERINEUM AFTER ESTROGEN CREAM . OK TO REAPPLY AS NEEDED 430 g 2   JARDIANCE  10 MG TABS tablet TAKE 1 TABLET DAILY BEFORE BREAKFAST 90 tablet 3   metFORMIN  (GLUCOPHAGE  XR) 750 MG 24 hr tablet Take 2 tablets (1,500 mg total) by mouth daily with breakfast. 180 tablet 3   methotrexate  (RHEUMATREX) 2.5 MG tablet Take 3 tablets (7.5 mg total) by mouth once a week. Caution:Chemotherapy. Protect from light. 36 tablet 1   montelukast  (SINGULAIR ) 10 MG tablet Take 1 tablet (10 mg total) by mouth at bedtime. 90 tablet 3   Multiple Vitamin (MULTIVITAMIN) capsule Take 1 capsule by mouth daily.     Precision Thin Lancets MISC Use as directed to check sugars twice daily 180 each 3   vitamin E 400 UNIT capsule Take 400 Units by mouth daily.     amLODipine  (NORVASC ) 2.5 MG tablet Take 1 tablet (2.5  mg total) by mouth daily. 90 tablet 3   escitalopram  (LEXAPRO ) 5 MG tablet Take 1 tablet (5 mg total) by mouth every evening. 90 tablet 3   glucose blood (FREESTYLE LITE) test strip Use as instructed to test blood sugar once daily 100 each 3   loratadine  (CLARITIN ) 10 MG tablet Take 1 tablet (10 mg total) by mouth daily. 90 tablet 3   omeprazole  (PRILOSEC) 20 MG capsule Take 1 capsule (20 mg total) by mouth daily. 90 capsule 3   rosuvastatin  (CRESTOR ) 10 MG tablet Take 1 tablet (10 mg total) by mouth daily. 90 tablet 3   Semaglutide , 2 MG/DOSE, 8 MG/3ML SOPN Inject 2 mg as directed once a week. 9 mL 3   telmisartan  (MICARDIS ) 80 MG tablet Take 1 tablet (80 mg total) by mouth daily.  90 tablet 3   No facility-administered medications prior to visit.    Review of Systems;  Patient denies headache, fevers, malaise, unintentional weight loss, skin rash, eye pain, sinus congestion and sinus pain, sore throat, dysphagia,  hemoptysis , cough, dyspnea, wheezing, chest pain, palpitations, orthopnea, edema, abdominal pain, nausea, melena, diarrhea, constipation, flank pain, dysuria, hematuria, urinary  Frequency, nocturia, numbness, tingling, seizures,  Focal weakness, Loss of consciousness,  Tremor, insomnia, depression, anxiety, and suicidal ideation.      Objective:  BP 130/74   Pulse 84   Ht 4' 11 (1.499 m)   Wt 122 lb 9.6 oz (55.6 kg)   SpO2 96%   BMI 24.76 kg/m   BP Readings from Last 3 Encounters:  06/23/23 130/74  12/21/22 130/68  08/08/22 136/71    Wt Readings from Last 3 Encounters:  06/23/23 122 lb 9.6 oz (55.6 kg)  12/21/22 121 lb 6.4 oz (55.1 kg)  08/08/22 117 lb (53.1 kg)    Physical Exam Vitals reviewed.  Constitutional:      General: She is not in acute distress.    Appearance: Normal appearance. She is normal weight. She is not ill-appearing, toxic-appearing or diaphoretic.  HENT:     Head: Normocephalic.   Eyes:     General: No scleral icterus.       Right eye: No discharge.        Left eye: No discharge.     Conjunctiva/sclera: Conjunctivae normal.    Cardiovascular:     Rate and Rhythm: Normal rate and regular rhythm.     Heart sounds: Normal heart sounds.  Pulmonary:     Effort: Pulmonary effort is normal. No respiratory distress.     Breath sounds: Normal breath sounds.   Musculoskeletal:        General: Normal range of motion.   Skin:    General: Skin is warm and dry.   Neurological:     General: No focal deficit present.     Mental Status: She is alert and oriented to person, place, and time. Mental status is at baseline.   Psychiatric:        Mood and Affect: Mood normal.        Behavior: Behavior normal.         Thought Content: Thought content normal.        Judgment: Judgment normal.     Lab Results  Component Value Date   HGBA1C 6.3 06/21/2023   HGBA1C 6.2 12/16/2022   HGBA1C 6.0 06/16/2022    Lab Results  Component Value Date   CREATININE 0.58 06/21/2023   CREATININE 0.64 12/16/2022   CREATININE 0.62 06/16/2022  Lab Results  Component Value Date   WBC 5.5 06/16/2022   HGB 14.4 06/16/2022   HCT 43.4 06/16/2022   PLT 235.0 06/16/2022   GLUCOSE 119 (H) 06/21/2023   CHOL 152 06/21/2023   TRIG 81.0 06/21/2023   HDL 64.70 06/21/2023   LDLDIRECT 58.0 12/16/2022   LDLCALC 71 06/21/2023   ALT 21 06/21/2023   AST 23 06/21/2023   NA 136 06/21/2023   K 4.2 06/21/2023   CL 103 06/21/2023   CREATININE 0.58 06/21/2023   BUN 17 06/21/2023   CO2 28 06/21/2023   TSH 1.83 06/16/2022   HGBA1C 6.3 06/21/2023   MICROALBUR 1.3 06/21/2023    US  RT BREAST BX W LOC DEV 1ST LESION IMG BX SPEC US  GUIDE Addendum Date: 02/15/2023 ADDENDUM REPORT: 02/15/2023 09:36 ADDENDUM: PATHOLOGY revealed: 1. Breast, right, needle core biopsy, 11:30 o'clock, 5 cmfn : - ORGANIZING FAT NECROSIS - NEGATIVE FOR MALIGNANCY. Pathology results are CONCORDANT with imaging findings, per Dr. Dirk Arrant. Pathology results and recommendations were discussed with patient via telephone on 02/09/2023 by Rock Hover RN. Patient reported biopsy site doing well with no adverse symptoms, and only slight tenderness at the site. Post biopsy care instructions were reviewed, questions were answered and my direct phone number was provided. Patient was instructed to call [facility] for any additional questions or concerns related to biopsy site. RECOMMENDATION: Patient instructed to resume annual bilateral screening mammogram due January 2026. Pathology results reported by Rock Hover RN on 02/09/2023. Electronically Signed   By: Dirk Arrant M.D.   On: 02/15/2023 09:36   Result Date: 02/15/2023 CLINICAL DATA:  78 year old  female with screen detected indeterminate mass in the right breast. EXAM: ULTRASOUND GUIDED RIGHT BREAST CORE NEEDLE BIOPSY COMPARISON:  Previous exam(s). PROCEDURE: I met with the patient and we discussed the procedure of ultrasound-guided biopsy, including benefits and alternatives. We discussed the high likelihood of a successful procedure. We discussed the risks of the procedure, including infection, bleeding, tissue injury, clip migration, and inadequate sampling. Informed written consent was given. The usual time-out protocol was performed immediately prior to the procedure. Lesion quadrant: Upper outer quadrant Using sterile technique and 1% Lidocaine  as local anesthetic, under direct ultrasound visualization, a 14 gauge spring-loaded device was used to perform biopsy of a mass in the right breast 11:30 o'clock position 5 cm from the nipple using a lateral approach. At the conclusion of the procedure, a heart shaped tissue marker clip was deployed into the biopsy cavity. Follow up 2 view mammogram was performed and dictated separately. IMPRESSION: Ultrasound guided biopsy of a mass in the right breast 11:30 o'clock position. No apparent complications. Electronically Signed: By: Dirk Arrant M.D. On: 02/06/2023 08:35   MM CLIP PLACEMENT RIGHT Result Date: 02/06/2023 CLINICAL DATA:  Status post ultrasound-guided biopsy of an indeterminate screen detected mass in the RIGHT breast EXAM: 3D DIAGNOSTIC RIGHT MAMMOGRAM POST ULTRASOUND BIOPSY COMPARISON:  Previous exam(s). FINDINGS: 3D Mammographic images were obtained following ultrasound guided biopsy of a mass in the right breast 11:30 o'clock position 5 cm from the nipple. The heart shaped biopsy marking clip is in expected position at the site of biopsy. IMPRESSION: Appropriate positioning of the heart shaped biopsy marking clip at the site of biopsy in the right breast 11:30 o'clock position. Final Assessment: Post Procedure Mammograms for Marker  Placement Electronically Signed   By: Dirk Arrant M.D.   On: 02/06/2023 08:40    Assessment & Plan:  .Other fatigue -  CBC with Differential/Platelet; Future -     TSH; Future  Hyperlipidemia associated with type 2 diabetes mellitus (HCC) -     Rosuvastatin  Calcium ; Take 1 tablet (10 mg total) by mouth daily.  Dispense: 90 tablet; Refill: 3 -     Lipid panel; Future -     LDL cholesterol, direct; Future  DM type 2 with diabetic mixed hyperlipidemia (HCC) Assessment & Plan: Currently well-controlled on current regimen of metformin  XR 1500 mg ,  Ozempic  2 mg weekly,  and Jardiance  10 mg daily .  hemoglobin A1c is at goal of less than 7.0 . Patient is still losing weight on  mg ozempic  dose , goal is 115 lbs  reminded to schedule an annual eye exam and foot exam is normal today. Patient has no microalbuminuria. Patient is tolerating statin therapy for CAD risk reduction and on ACE/ARB for renal protection and hypertension   Lab Results  Component Value Date   HGBA1C 6.3 06/21/2023   Lab Results  Component Value Date   MICROALBUR 1.3 06/21/2023   MICROALBUR <0.7 06/16/2022    Lab Results  Component Value Date   CHOL 152 06/21/2023   HDL 64.70 06/21/2023   LDLCALC 71 06/21/2023   LDLDIRECT 58.0 12/16/2022   TRIG 81.0 06/21/2023   CHOLHDL 2 06/21/2023     Orders: -     Semaglutide  (2 MG/DOSE); Inject 2 mg as directed once a week.  Dispense: 3 mL; Refill: 11 -     Comprehensive metabolic panel with GFR; Future -     Hemoglobin A1c; Future -     Telmisartan ; Take 1 tablet (80 mg total) by mouth daily.  Dispense: 90 tablet; Refill: 3  Diabetes mellitus treated with injections of non-insulin  medication (HCC) Assessment & Plan: Currently well-controlled on current medications .  hemoglobin A1c is at goal of less than 7.0 .  Continue Ozempic   Lab Results  Component Value Date   HGBA1C 6.3 06/21/2023    Lab Results  Component Value Date   MICROALBUR 1.3 06/21/2023    MICROALBUR <0.7 06/16/2022        Guttate psoriasis Assessment & Plan: In remission with 7.5 mg weekly dose of MT.  Lower doses have caused relapse.    White coat syndrome with diagnosis of hypertension Assessment & Plan: Mmanage with telmisartan  alone after suspending amlodipine  and hctz for symptomatic hypotension    Chronic left hip pain Assessment & Plan: Secondary to osteoarthritis.  Taking celebrex . No improvement  with PT.  Follo up with Ortho    Other orders -     amLODIPine  Besylate; Take 1 tablet (2.5 mg total) by mouth daily.  Dispense: 90 tablet; Refill: 3 -     FreeStyle Lite Test; Use as instructed to test blood sugar once daily  Dispense: 100 each; Refill: 3 -     Loratadine ; Take 1 tablet (10 mg total) by mouth daily.  Dispense: 90 tablet; Refill: 3 -     Escitalopram  Oxalate; Take 1 tablet (5 mg total) by mouth every evening.  Dispense: 90 tablet; Refill: 3 -     Omeprazole ; Take 1 capsule (20 mg total) by mouth daily.  Dispense: 90 capsule; Refill: 3 -     Doxycycline  Hyclate; Take 1 tablet (100 mg total) by mouth 2 (two) times daily.  Dispense: 14 tablet; Refill: 0    Follow-up: Return in about 6 months (around 12/23/2023) for follow up diabetes, physical.   Verneita LITTIE Kettering, MD

## 2023-06-23 NOTE — Patient Instructions (Addendum)
 For your hip pain  Do NOT  combine celebrex with advil  , motrin  or aleve .  You CAN add up to  3000 mg of acetominophen (tylenol ) every day safely  In divided doses (1000 mg every 8 to 12 hours )  Tylenol  PM is tylenol   PLUS BENADRYL   Finish the doxycycline  and use warm compresses on the tick bite before you try to remove the leg with a sterile tweezer      Your diabetes remains under excellent control  And your cholesterol and other labs are also normal. Please continue your current medications. return in 6 months for  your CPE and follow up on diabetes

## 2023-06-24 NOTE — Assessment & Plan Note (Signed)
 Currently well-controlled on current regimen of metformin  XR 1500 mg ,  Ozempic  2 mg weekly,  and Jardiance  10 mg daily .  hemoglobin A1c is at goal of less than 7.0 . Patient is still losing weight on  mg ozempic  dose , goal is 115 lbs  reminded to schedule an annual eye exam and foot exam is normal today. Patient has no microalbuminuria. Patient is tolerating statin therapy for CAD risk reduction and on ACE/ARB for renal protection and hypertension   Lab Results  Component Value Date   HGBA1C 6.3 06/21/2023   Lab Results  Component Value Date   MICROALBUR 1.3 06/21/2023   MICROALBUR <0.7 06/16/2022    Lab Results  Component Value Date   CHOL 152 06/21/2023   HDL 64.70 06/21/2023   LDLCALC 71 06/21/2023   LDLDIRECT 58.0 12/16/2022   TRIG 81.0 06/21/2023   CHOLHDL 2 06/21/2023

## 2023-06-24 NOTE — Assessment & Plan Note (Signed)
In remission with 7.5 mg weekly dose of MT.  Lower doses have caused relapse.

## 2023-06-24 NOTE — Progress Notes (Incomplete)
 Subjective:  Patient ID: Emily Ryan, female    DOB: 1945-02-24  Age: 78 y.o. MRN: [id number]  CC: The primary encounter diagnosis was Other fatigue. Diagnoses of Hyperlipidemia associated with type 2 diabetes mellitus (HCC) and DM type 2 with diabetic mixed hyperlipidemia (HCC) were also pertinent to this visit.   HPI Emily Ryan presents for  Chief Complaint  Patient presents with  . Medical Management of Chronic Issues    6 month follow up    1) left hip pain:  she has been taking celebrex since June 5 for left hip OA /bursitis  and going to PT at [facility].  Pain is still problematic,  present by end of day , relieved only by lying down.  Scheduled for Ortho follow up in July   2) recent tick bite right medial thigh  found and removed on June 12 but possible retained mouth. Taking doxycucline   3) type 2 DM: she feels generally well, is exercising daily and checking blood sugars once daily at variable times.  BS have been under 130 fasting and < 150 post prandially.  Had a recent  hypoglyemic event in mid afternoon .  Taking Ozempic , Jardiance  and metformin   as directed. Following a carbohydrate modified diet 6 days per week. Denies numbness, burning and tingling of extremities. Appetite is diminisjed.       Outpatient Medications Prior to Visit  Medication Sig Dispense Refill  . amLODipine  (NORVASC ) 2.5 MG tablet Take 1 tablet (2.5 mg total) by mouth daily. 90 tablet 3  . Ascorbic Acid (VITAMIN C) 1000 MG tablet Take 1,000 mg by mouth daily.     . aspirin  EC 81 MG tablet Take 81 mg by mouth at bedtime. Swallow whole.    . azithromycin  (ZITHROMAX ) 500 MG tablet Take 1 tablet (500 mg total) by mouth daily. For respiratory infection 7 tablet 0  . Calcium  Carbonate-Vitamin D3 600-400 MG-UNIT TABS Take 1 tablet by mouth daily.    . celecoxib (CELEBREX) 200 MG capsule Take 200 mg by mouth 2 (two) times daily.    . clobetasol cream (TEMOVATE) 0.05 % Apply 1 application topically 2  (two) times daily as needed (psoriasis).    . escitalopram  (LEXAPRO ) 5 MG tablet Take 1 tablet (5 mg total) by mouth every evening. 90 tablet 3  . estradiol  (ESTRACE ) 0.1 MG/GM vaginal cream APPLY 1 GRAM INTRAVAGINALLY AND TO PERINEUM NIGHTLY X 14 DAYS,  THEN TWICE WEEKLY THEREAFTER 42.5 g 12  . folic acid  (FOLVITE ) 1 MG tablet Take 1 tablet (1 mg total) by mouth See admin instructions. Takes daily except on Saturday 90 tablet 3  . glucose blood (FREESTYLE LITE) test strip Use as instructed to test blood sugar once daily 100 each 3  . Infant Care Products (DERMACLOUD) OINT APPLY TO PERINEUM AFTER ESTROGEN CREAM . OK TO REAPPLY AS NEEDED 430 g 2  . JARDIANCE  10 MG TABS tablet TAKE 1 TABLET DAILY BEFORE BREAKFAST 90 tablet 3  . loratadine  (CLARITIN ) 10 MG tablet Take 1 tablet (10 mg total) by mouth daily. 90 tablet 3  . metFORMIN  (GLUCOPHAGE  XR) 750 MG 24 hr tablet Take 2 tablets (1,500 mg total) by mouth daily with breakfast. 180 tablet 3  . methotrexate  (RHEUMATREX) 2.5 MG tablet Take 3 tablets (7.5 mg total) by mouth once a week. Caution:Chemotherapy. Protect from light. 36 tablet 1  . montelukast  (SINGULAIR ) 10 MG tablet Take 1 tablet (10 mg total) by mouth at bedtime. 90 tablet 3  .  Multiple Vitamin (MULTIVITAMIN) capsule Take 1 capsule by mouth daily.    . omeprazole  (PRILOSEC) 20 MG capsule Take 1 capsule (20 mg total) by mouth daily. 90 capsule 3  . Precision Thin Lancets MISC Use as directed to check sugars twice daily 180 each 3  . rosuvastatin  (CRESTOR ) 10 MG tablet Take 1 tablet (10 mg total) by mouth daily. 90 tablet 3  . Semaglutide , 2 MG/DOSE, 8 MG/3ML SOPN Inject 2 mg as directed once a week. 9 mL 3  . telmisartan  (MICARDIS ) 80 MG tablet Take 1 tablet (80 mg total) by mouth daily. 90 tablet 3  . vitamin E 400 UNIT capsule Take 400 Units by mouth daily.     No facility-administered medications prior to visit.    Review of Systems;  Patient denies headache, fevers, malaise,  unintentional weight loss, skin rash, eye pain, sinus congestion and sinus pain, sore throat, dysphagia,  hemoptysis , cough, dyspnea, wheezing, chest pain, palpitations, orthopnea, edema, abdominal pain, nausea, melena, diarrhea, constipation, flank pain, dysuria, hematuria, urinary  Frequency, nocturia, numbness, tingling, seizures,  Focal weakness, Loss of consciousness,  Tremor, insomnia, depression, anxiety, and suicidal ideation.      Objective:  BP 130/74   Pulse 84   Ht 4' 11 (1.499 m)   Wt 122 lb 9.6 oz (55.6 kg)   SpO2 96%   BMI 24.76 kg/m   BP Readings from Last 3 Encounters:  06/23/23 130/74  12/21/22 130/68  08/08/22 136/71    Wt Readings from Last 3 Encounters:  06/23/23 122 lb 9.6 oz (55.6 kg)  12/21/22 121 lb 6.4 oz (55.1 kg)  08/08/22 117 lb (53.1 kg)    Physical Exam  Lab Results  Component Value Date   HGBA1C 6.3 06/21/2023   HGBA1C 6.2 12/16/2022   HGBA1C 6.0 06/16/2022    Lab Results  Component Value Date   CREATININE 0.58 06/21/2023   CREATININE 0.64 12/16/2022   CREATININE 0.62 06/16/2022    Lab Results  Component Value Date   WBC 5.5 06/16/2022   HGB 14.4 06/16/2022   HCT 43.4 06/16/2022   PLT 235.0 06/16/2022   GLUCOSE 119 (H) 06/21/2023   CHOL 152 06/21/2023   TRIG 81.0 06/21/2023   HDL 64.70 06/21/2023   LDLDIRECT 58.0 12/16/2022   LDLCALC 71 06/21/2023   ALT 21 06/21/2023   AST 23 06/21/2023   NA 136 06/21/2023   K 4.2 06/21/2023   CL 103 06/21/2023   CREATININE 0.58 06/21/2023   BUN 17 06/21/2023   CO2 28 06/21/2023   TSH 1.83 06/16/2022   HGBA1C 6.3 06/21/2023   MICROALBUR 1.3 06/21/2023    US  RT BREAST BX W LOC DEV 1ST LESION IMG BX SPEC US  GUIDE Addendum Date: 02/15/2023 ADDENDUM REPORT: 02/15/2023 09:36 ADDENDUM: PATHOLOGY revealed: 1. Breast, right, needle core biopsy, 11:30 o'clock, 5 cmfn : - ORGANIZING FAT NECROSIS - NEGATIVE FOR MALIGNANCY. Pathology results are CONCORDANT with imaging findings, per Dr. Dirk Arrant. Pathology results and recommendations were discussed with patient via telephone on 02/09/2023 by Rock Hover RN. Patient reported biopsy site doing well with no adverse symptoms, and only slight tenderness at the site. Post biopsy care instructions were reviewed, questions were answered and my direct phone number was provided. Patient was instructed to call [facility] for any additional questions or concerns related to biopsy site. RECOMMENDATION: Patient instructed to resume annual bilateral screening mammogram due January 2026. Pathology results reported by Rock Hover RN on 02/09/2023. Electronically Signed  By: Meghana  Konanur M.D.   On: 02/15/2023 09:36   Result Date: 02/15/2023 CLINICAL DATA:  78 year old female with screen detected indeterminate mass in the right breast. EXAM: ULTRASOUND GUIDED RIGHT BREAST CORE NEEDLE BIOPSY COMPARISON:  Previous exam(s). PROCEDURE: I met with the patient and we discussed the procedure of ultrasound-guided biopsy, including benefits and alternatives. We discussed the high likelihood of a successful procedure. We discussed the risks of the procedure, including infection, bleeding, tissue injury, clip migration, and inadequate sampling. Informed written consent was given. The usual time-out protocol was performed immediately prior to the procedure. Lesion quadrant: Upper outer quadrant Using sterile technique and 1% Lidocaine  as local anesthetic, under direct ultrasound visualization, a 14 gauge spring-loaded device was used to perform biopsy of a mass in the right breast 11:30 o'clock position 5 cm from the nipple using a lateral approach. At the conclusion of the procedure, a heart shaped tissue marker clip was deployed into the biopsy cavity. Follow up 2 view mammogram was performed and dictated separately. IMPRESSION: Ultrasound guided biopsy of a mass in the right breast 11:30 o'clock position. No apparent complications. Electronically Signed: By:  Dirk Arrant M.D. On: 02/06/2023 08:35   MM CLIP PLACEMENT RIGHT Result Date: 02/06/2023 CLINICAL DATA:  Status post ultrasound-guided biopsy of an indeterminate screen detected mass in the RIGHT breast EXAM: 3D DIAGNOSTIC RIGHT MAMMOGRAM POST ULTRASOUND BIOPSY COMPARISON:  Previous exam(s). FINDINGS: 3D Mammographic images were obtained following ultrasound guided biopsy of a mass in the right breast 11:30 o'clock position 5 cm from the nipple. The heart shaped biopsy marking clip is in expected position at the site of biopsy. IMPRESSION: Appropriate positioning of the heart shaped biopsy marking clip at the site of biopsy in the right breast 11:30 o'clock position. Final Assessment: Post Procedure Mammograms for Marker Placement Electronically Signed   By: Dirk Arrant M.D.   On: 02/06/2023 08:40    Assessment & Plan:  .Other fatigue  Hyperlipidemia associated with type 2 diabetes mellitus (HCC)  DM type 2 with diabetic mixed hyperlipidemia (HCC)     I spent 34 minutes on the day of this face to face encounter reviewing patient's  most recent visit with cardiology,  nephrology,  and neurology,  prior relevant surgical and non surgical procedures, recent  labs and imaging studies, counseling on weight management,  reviewing the assessment and plan with patient, and post visit ordering and reviewing of  diagnostics and therapeutics with patient  .   Follow-up: No follow-ups on file.   Verneita LITTIE Kettering, MD

## 2023-06-24 NOTE — Assessment & Plan Note (Addendum)
 Currently well-controlled on current medications .  hemoglobin A1c is at goal of less than 7.0 .  Continue Ozempic   Lab Results  Component Value Date   HGBA1C 6.3 06/21/2023    Lab Results  Component Value Date   MICROALBUR 1.3 06/21/2023   MICROALBUR <0.7 06/16/2022

## 2023-06-25 ENCOUNTER — Ambulatory Visit: Payer: Self-pay | Admitting: Internal Medicine

## 2023-06-25 DIAGNOSIS — G8929 Other chronic pain: Secondary | ICD-10-CM | POA: Insufficient documentation

## 2023-06-25 NOTE — Assessment & Plan Note (Signed)
 Secondary to osteoarthritis.  Taking celebrex . No improvement  with PT.  Follo up with Ortho

## 2023-06-25 NOTE — Assessment & Plan Note (Signed)
 Mmanage with telmisartan  alone after suspending amlodipine  and hctz for symptomatic hypotension

## 2023-06-26 DIAGNOSIS — M6281 Muscle weakness (generalized): Secondary | ICD-10-CM | POA: Diagnosis not present

## 2023-06-26 DIAGNOSIS — M25552 Pain in left hip: Secondary | ICD-10-CM | POA: Diagnosis not present

## 2023-06-28 DIAGNOSIS — M545 Low back pain, unspecified: Secondary | ICD-10-CM | POA: Diagnosis not present

## 2023-06-28 DIAGNOSIS — M25552 Pain in left hip: Secondary | ICD-10-CM | POA: Diagnosis not present

## 2023-06-28 DIAGNOSIS — M6281 Muscle weakness (generalized): Secondary | ICD-10-CM | POA: Diagnosis not present

## 2023-07-04 DIAGNOSIS — M6281 Muscle weakness (generalized): Secondary | ICD-10-CM | POA: Diagnosis not present

## 2023-07-04 DIAGNOSIS — M25552 Pain in left hip: Secondary | ICD-10-CM | POA: Diagnosis not present

## 2023-07-04 DIAGNOSIS — M545 Low back pain, unspecified: Secondary | ICD-10-CM | POA: Diagnosis not present

## 2023-07-05 DIAGNOSIS — M47816 Spondylosis without myelopathy or radiculopathy, lumbar region: Secondary | ICD-10-CM | POA: Diagnosis not present

## 2023-07-05 DIAGNOSIS — M7062 Trochanteric bursitis, left hip: Secondary | ICD-10-CM | POA: Diagnosis not present

## 2023-07-05 DIAGNOSIS — M1612 Unilateral primary osteoarthritis, left hip: Secondary | ICD-10-CM | POA: Diagnosis not present

## 2023-07-06 DIAGNOSIS — M6281 Muscle weakness (generalized): Secondary | ICD-10-CM | POA: Diagnosis not present

## 2023-07-06 DIAGNOSIS — M25552 Pain in left hip: Secondary | ICD-10-CM | POA: Diagnosis not present

## 2023-07-06 DIAGNOSIS — M545 Low back pain, unspecified: Secondary | ICD-10-CM | POA: Diagnosis not present

## 2023-07-13 DIAGNOSIS — L400 Psoriasis vulgaris: Secondary | ICD-10-CM | POA: Diagnosis not present

## 2023-07-13 DIAGNOSIS — L821 Other seborrheic keratosis: Secondary | ICD-10-CM | POA: Diagnosis not present

## 2023-07-13 DIAGNOSIS — D2262 Melanocytic nevi of left upper limb, including shoulder: Secondary | ICD-10-CM | POA: Diagnosis not present

## 2023-07-13 DIAGNOSIS — D2271 Melanocytic nevi of right lower limb, including hip: Secondary | ICD-10-CM | POA: Diagnosis not present

## 2023-07-13 DIAGNOSIS — S70361A Insect bite (nonvenomous), right thigh, initial encounter: Secondary | ICD-10-CM | POA: Diagnosis not present

## 2023-07-13 DIAGNOSIS — D2261 Melanocytic nevi of right upper limb, including shoulder: Secondary | ICD-10-CM | POA: Diagnosis not present

## 2023-07-13 DIAGNOSIS — D225 Melanocytic nevi of trunk: Secondary | ICD-10-CM | POA: Diagnosis not present

## 2023-07-13 DIAGNOSIS — D2272 Melanocytic nevi of left lower limb, including hip: Secondary | ICD-10-CM | POA: Diagnosis not present

## 2023-07-17 DIAGNOSIS — M6281 Muscle weakness (generalized): Secondary | ICD-10-CM | POA: Diagnosis not present

## 2023-07-17 DIAGNOSIS — M25552 Pain in left hip: Secondary | ICD-10-CM | POA: Diagnosis not present

## 2023-07-17 DIAGNOSIS — M545 Low back pain, unspecified: Secondary | ICD-10-CM | POA: Diagnosis not present

## 2023-07-19 DIAGNOSIS — M545 Low back pain, unspecified: Secondary | ICD-10-CM | POA: Diagnosis not present

## 2023-07-19 DIAGNOSIS — M6281 Muscle weakness (generalized): Secondary | ICD-10-CM | POA: Diagnosis not present

## 2023-07-19 DIAGNOSIS — M25552 Pain in left hip: Secondary | ICD-10-CM | POA: Diagnosis not present

## 2023-07-21 DIAGNOSIS — L400 Psoriasis vulgaris: Secondary | ICD-10-CM | POA: Diagnosis not present

## 2023-07-24 DIAGNOSIS — M25552 Pain in left hip: Secondary | ICD-10-CM | POA: Diagnosis not present

## 2023-07-24 DIAGNOSIS — M545 Low back pain, unspecified: Secondary | ICD-10-CM | POA: Diagnosis not present

## 2023-07-24 DIAGNOSIS — M6281 Muscle weakness (generalized): Secondary | ICD-10-CM | POA: Diagnosis not present

## 2023-07-26 DIAGNOSIS — M545 Low back pain, unspecified: Secondary | ICD-10-CM | POA: Diagnosis not present

## 2023-07-26 DIAGNOSIS — M25552 Pain in left hip: Secondary | ICD-10-CM | POA: Diagnosis not present

## 2023-07-26 DIAGNOSIS — M6281 Muscle weakness (generalized): Secondary | ICD-10-CM | POA: Diagnosis not present

## 2023-07-28 DIAGNOSIS — M7062 Trochanteric bursitis, left hip: Secondary | ICD-10-CM | POA: Diagnosis not present

## 2023-07-28 DIAGNOSIS — M47816 Spondylosis without myelopathy or radiculopathy, lumbar region: Secondary | ICD-10-CM | POA: Diagnosis not present

## 2023-07-28 DIAGNOSIS — M5416 Radiculopathy, lumbar region: Secondary | ICD-10-CM | POA: Diagnosis not present

## 2023-07-28 DIAGNOSIS — M1612 Unilateral primary osteoarthritis, left hip: Secondary | ICD-10-CM | POA: Diagnosis not present

## 2023-07-28 NOTE — Progress Notes (Signed)
 Chief Complaint: Chief Complaint  Patient presents with  . Left Hip - Pain   Emily Ryan is a 78 y.o. female who presents today for repeat evaluation of ongoing left leg and hip pain.  The patient has been evaluated for ongoing left leg pain in the past.  She was initially evaluated in June of this year, the patient did undergo x-rays of the left femur and lumbar spine which did demonstrate moderate left hip osteoarthritic changes involving left hip in addition to moderate arthritic changes involving the lumbar spine.  The patient initially did experience a occasional burning discomfort especially felt along the anterior aspect the left tibia, at her first evaluation it did seem that she had symptoms that stems from both her lumbar spine in addition to left hip versus greater trochanteric bursitis.  The patient was given a prescription of Celebrex and the patient was also given a referral for formal physical therapy.  The patient was reevaluated in July of this year.  The patient reported that the pain in her left leg had been improving since her initial evaluation and she was no longer experiencing any burning or tingling left lower extremity.  The patient was instructed to continue with her formal physical therapy and she was also given a refill of Celebrex to continue at this time.  The patient was instructed to follow-up me in 4 to 6 weeks for repeat evaluation, we did discuss the possibility of a left intra-articular hip injection versus a greater trochanteric bursa injection based on where her pain continued.  However 1-2 days ago the patient was performing exercises at the gym when she felt a sharp pain primarily in her left buttock and developed worsening left leg pain.  The patient states that the pain worsened significantly since this event occurred and she also developed increased burning and tingling along the left anterior tibia radiating into the left foot.  She denies any falls or trauma  affecting left leg.  She does report continued burning and tingling discomfort at today's visit.  She states that she had increased pain when attempting to walk and stand and felt like the left leg might give out.  The patient denies any bowel or bladder dysfunction.  She denies any saddle paresthesias.  She does still have pain along the lateral aspect of the hip but the primary concern is the burning and tingling that is rating down her left leg at this time.   Past Medical History: Past Medical History:  Diagnosis Date  . Allergic rhinitis 01/28/2013   Last Assessment & Plan:  Formatting of this note might be different from the original. This is her real problem, not asthma. Take Singulair  daily, Take Claritin  D daliy  . Anastomotic stricture of colorectal region 03/10/2020   Last Assessment & Plan:  Formatting of this note might be different from the original. S/p partial colectomy Feb 2020 with excellent results.  Moving bowels daily  Taking psyillium supplement daily .  SABRA Atherosclerosis of aorta () 10/14/2019  . Basal cell carcinoma of right forehead 08/28/2015   Last Assessment & Plan:  Formatting of this note might be different from the original. She has stopped the Singulair  and has no recurrence. Formatting of this note might be different from the original. Basal cell carcinoma of right ala treated with Mohs surgery on 11/12/2012.  Last Assessment & Plan:  Formatting of this note might be different from the original. S/p Moh's excision July 2017 by Bra  .  Breast calcifications on mammogram 09/13/2018   Formatting of this note might be different from the original. Left breast  Sept 2020  . Complicated pregnancy (HHS-HCC)    1st pregnancy complicated by post operative hemorrhage and 2ng complicated by epidural  . Constipation 01/19/2011   Last Assessment & Plan:  Formatting of this note might be different from the original. Recommended daily use of stool softener and fibercon  Or other fiber  ill to avoid daily use of lactulose .  . Diabetes mellitus (CMS/HHS-HCC)   . Diverticulosis of colon   . Diverticulosis of sigmoid colon 11/10/2010   Last Assessment & Plan:  Formatting of this note might be different from the original. Next colonoscopy is due 2015 .  SABRA Dyspareunia in female 08/20/2019  . Fall at home, initial encounter 10/14/2019   Last Assessment & Plan:  Formatting of this note might be different from the original. She is not orthostatic on exam today.  Bruises examined, no broken bones. Addressed need to have well lit pathway to bathroom and to rest on edge of bed before getting up  . Family history of Alzheimer's disease 08/13/2018   Last Assessment & Plan:  Formatting of this note might be different from the original. Her 47 yr old sister has been diagnosed and is widowed, awaiting admission to memory care facility  . Fracture of malleolus of right ankle, closed, with routine healing, subsequent encounter 12/09/2017  . Gastroesophageal reflux disease 03/10/2020  . GERD (gastroesophageal reflux disease)   . Guttate psoriasis   . Hearing loss 04/12/2019   Last Assessment & Plan:  Formatting of this note might be different from the original. Referral to ENT in progress  . Hemorrhage of colon due to diverticulosis 03/10/2020  . Hx of colonic polyps 03/10/2020  . Hyperlipidemia   . Hypertension   . Long-term use of high-risk medication 10/21/2011   Last Assessment & Plan:  Formatting of this note might be different from the original. Evaluation of liver parenchyma by Dr Unk with Fibroscan with elastrograph was done in May  . Macrocytosis without anemia 04/14/2018   Formatting of this note might be different from the original. Taking methotrexate  and metformin ,  b12 and folc acid supplementation advised.   Last Assessment & Plan:  Formatting of this note might be different from the original. B12 and MMA are both normal.  Continue supplementation of B12 and folate given concurrent use of  mtx and metformin   . Nontraumatic complete tear of right rotator cuff 11/08/2019  . Overweight (BMI 25.0-29.9) 05/04/2016   Formatting of this note might be different from the original. She lost weight unintentionally after her colonic resection and currently home weight is stable at 132 lbs   Last Assessment & Plan:  Formatting of this note might be different from the original. I have addressed  BMI and recommended wt loss of 10% of body weight over the next 6 months using a low glycemic index diet and regular exercis  . Patent foramen ovale with right to left shunt (HHS-HCC) 08/28/2015   Last Assessment & Plan:  Formatting of this note might be different from the original. Found incidentally on TEE with agitated saline study  Nov 2017.  No treatment planned since she is asymptomatic.  SABRA Perineal rash in female 04/12/2019   Last Assessment & Plan:  Formatting of this note might be different from the original. Etiology unclear,   May be related to psoriasis or may be Bowen's disease. Needs biopsy.  GYN referral made.  SABRA Postmenopausal estrogen deficiency 05/07/2015   Last Assessment & Plan:  Formatting of this note might be different from the original. Bone Density scores from July reviewed.  she has osteopenia,  Moderate.  Would repeat in 2 years and consider therapy then if there is a significant change. Continue calcium , vitamin d  and weight bearing exercise on a regular basis.  . Rectal stricture   . Reflux esophagitis 01/19/2011   Last Assessment & Plan:  Formatting of this note might be different from the original. Managed with omeprazole   . Rotator cuff arthropathy, right 11/25/2019  . Rotator cuff tendinitis, right 11/08/2019  . S/P partial colectomy 08/05/2017   Last Assessment & Plan:  Formatting of this note might be different from the original. Remotely Secondary to diverticular perforation.  Last complete colonoscopy was In 2005  . Screening for malignant neoplasm of colon 03/10/2020  . Skin cancer    . Tubular adenoma of colon 08/03/2017   Formatting of this note might be different from the original. By colonoscopy July 2019  . Uncontrolled type 2 diabetes mellitus 02/08/2015   Last Assessment & Plan:  Formatting of this note is different from the original. Loss of control CONTINUED despite increase metformin  dose and change in glipzide to xr dosing  5mg  , and trulicity    Recommend increasing glipiide to 10 mg daily. Discussed use of a CBG monitor.  Continue losartan  and simvastatin    Lab Results  Component Value Date   HGBA1C 7.8 (H) 01/14/2020   Lab Results  Component   . Vaginal atrophy 08/20/2019    Past Surgical History: Past Surgical History:  Procedure Laterality Date  . TONSILLECTOMY  1953  . VARICOSE VEIN SURGERY Right 1974  . BLADDER REPAIR  1991   LIFT  . PARTIAL COLECTOMY  2012   left, secondary to diverticular perf  . MOHS SURGERY  2017   Face  . TEE WITHOUT CARDIOVERSION  11/24/2015   Procedure: TRANSESOPHAGEAL ECHOCARDIOGRAM (TEE); Surgeon: Evalene JINNY Lunger, MD; Location: [facility]; Service: Cardiovascular;  . COLONOSCOPY WITH PROPOFOL   07/17/2017   Procedure: COLONOSCOPY WITH PROPOFOL ; Surgeon: Unk Corinn Skiff, MD; Location: [facility]  . FLEXIBLE SIGMOIDOSCOPY  07/31/2017   Procedure: FLEXIBLE SIGMOIDOSCOPY; Surgeon: Unk Corinn Skiff, MD; Location: [facility]  . COLONOSCOPY WITH PROPOFOL   11/02/2017   Procedure: COLONOSCOPY WITH PROPOFOL ; Surgeon: Unk Corinn Skiff, MD; Location: [facility];  . COLON RESECTION SIGMOID  02/19/2018   Procedure: LAPAROSCOPIC SIGMOID COLECTOMY- POSSIBLE OPEN; Surgeon: Desiderio Schanz, MD; Location: [facility]  . CYSTOSCOPY W/ URETERAL STENT PLACEMENT Bilateral 02/19/2018   Procedure: CYSTOSCOPY WITH STENT PLACEMENT GOING 1ST; Surgeon: Francisca Redell BROCKS, MD; Location: [facility]; Service: Urology; Laterality: Bilateral;  .  Reverse right total shoulder arthroplasty. Right 02/26/2020   Dr. Edie  . ABDOMINAL HYSTERECTOMY    .  APPENDECTOMY     2012  . CYSTOCELE REPAIR    . HERNIA REPAIR    . REPAIR RECTOCELE    . SEPTOPLASTY     1969    Past Family History: Family History  Problem Relation Age of Onset  . Heart disease Mother   . Macular degeneration Mother   . Cancer Father   . Diabetes Sister   . Dementia Sister   . Diabetes Brother   . Stroke Brother   . Colon cancer Maternal Grandmother     Medications: Current Outpatient Medications  Medication Sig Dispense Refill  . celecoxib (CELEBREX) 200 MG capsule Take 1 capsule (  200 mg total) by mouth 2 (two) times daily 30 capsule 0  . amLODIPine  (NORVASC ) 2.5 MG tablet Take 2.5 mg by mouth once daily    . aspirin  81 mg Cap Take by mouth once daily    . blood glucose diagnostic (FREESTYLE LITE STRIPS) test strip once daily    . calcium  carbonate-vit D3-min 600 mg calcium - 400 unit Tab Take 1 tablet by mouth once daily    . clobetasoL (TEMOVATE) 0.05 % cream APPLY EXTERNALLY TO THE AFFECTED AREA OF NON-FACIAL PSORIASIS TWICE DAILY UNTIL CLEAR.    . conjugated estrogens  (PREMARIN ) 0.625 mg/gram vaginal cream Place vaginally INSERT 1 APPLICATORFUL VAGINALLY DAILY AS DIRECTED    . CYANOCOBALAMIN , VITAMIN B-12, ORAL Take 5,000 mcg by mouth once daily    . dulaglutide  (TRULICITY ) 1.5 mg/0.5 mL subcutaneous injection Inject subcutaneously INJECT 0.5 MILLILITERS (1.5 MG TOTAL) SUB-CUTANEOUSLY ONCE A WEEK. (Patient not taking: Reported on 08/28/2020)    . empagliflozin  (JARDIANCE ) 10 mg tablet Take 1 tablet by mouth once daily    . escitalopram  oxalate (LEXAPRO ) 5 MG tablet Take 5 mg by mouth every evening    . estradioL  (ESTRACE ) 0.01 % (0.1 mg/gram) vaginal cream APPLY 1 GRAM INTRAVAGINALLY AND TO PERINEUM NIGHTLY X 14 DAYS,  THEN TWICE WEEKLY THEREAFTER    . ezetimibe  (ZETIA ) 10 mg tablet Take 1 tablet by mouth once daily (Patient not taking: Reported on 08/28/2020)    . folic acid  (FOLVITE ) 1 MG tablet Take by mouth TAKE 1 TABLET (1 MG TOTAL) BY MOUTH DAILY  EXCEPT ON SATURDAY    . glipiZIDE  (GLUCOTROL  XL) 5 MG XL tablet Take by mouth Take 1 tablet (5 mg total) by mouth daily with breakfast.    . hydroCHLOROthiazide  (HYDRODIURIL ) 12.5 MG tablet Take by mouth TAKE 1 TABLET (12.5 MG TOTAL) BY MOUTH DAILY    . hydrocortisone 2.5 % cream     . loratadine  (CLARITIN ) 10 mg tablet     . losartan  (COZAAR ) 100 MG tablet Take 100 mg by mouth once daily (Patient not taking: Reported on 08/28/2020)    . metFORMIN  (GLUCOPHAGE -XR) 750 MG XR tablet Take by mouth Take 2 tablets (1,500 mg total) by mouth daily with breakfast.    . methotrexate  (RHEUMATREX) 2.5 MG tablet Take by mouth TAKE 3 TABLETS BY MOUTH ONCE A WEEK AS DIRECTED    . montelukast  (SINGULAIR ) 10 mg tablet Take by mouth Take 1 tablet (10 mg total) by mouth at bedtime.    . multivitamin capsule Take 1 capsule by mouth once daily    . omeprazole  (PRILOSEC) 20 MG DR capsule Take 1 capsule by mouth once daily    . oseltamivir  (TAMIFLU ) 75 MG capsule Take 75 mg by mouth 2 (two) times daily (Patient not taking: Reported on 02/21/2020)    . rosuvastatin  (CRESTOR ) 10 MG tablet     . semaglutide  (OZEMPIC ) 0.25 mg or 0.5 mg(2 mg/1.5 mL) pen injector Inject 0.25 mg weekly for 2 weeks then increase to 0.5 mg weekly    . simvastatin  (ZOCOR ) 20 MG tablet Take 1 tablet by mouth nightly (Patient not taking: Reported on 08/28/2020)    . telmisartan  (MICARDIS ) 80 MG tablet     . traMADoL  (ULTRAM ) 50 mg tablet Take 1 tablet (50 mg total) by mouth every 6 (six) hours as needed 20 tablet 0  . vitamin E 400 UNIT capsule Take by mouth Take 400 Units by mouth daily.     Current Facility-Administered Medications  Medication Dose Route Frequency  Provider Last Rate Last Admin  . ketorolac  (TORADOL ) inj syringe 60 mg  60 mg Intramuscular Once Kip Lynwood Double, [place]        Allergies: Allergies  Allergen Reactions  . Prednisone  Hives     Review of Systems:  A comprehensive 14 point ROS was performed, reviewed by me today,  and the pertinent orthopaedic findings are documented in the HPI.   Exam: BP 122/78   Ht 149.9 cm (4' 11)   Wt 56.2 kg (124 lb)   BMI 25.04 kg/m  General/Constitutional: The patient appears to be well-nourished, well-developed, and in no acute distress. Neuro/Psych: Normal mood and affect, oriented to person, place and time. Eyes: Non-icteric.  Pupils are equal, round, and reactive to light, and exhibit synchronous movement. ENT: Unremarkable. Lymphatic: No palpable adenopathy. Respiratory: Non-labored breathing Cardiovascular: No edema, swelling or tenderness, except as noted in detailed exam. Integumentary: No impressive skin lesions present, except as noted in detailed exam. Musculoskeletal: Unremarkable, except as noted in detailed exam.  General: Well developed, well nourished 78 y.o. female in no apparent distress.  Normal affect.  Normal communication.  Patient answers questions appropriately.  The patient presents today in a wheelchair which is abnormal for the patient.  Cranial Nerves: Pupils equal round and reactive to light.  Facial tone is symmetric.  Facial sensation is symmetric. Shoulder shrug is symmetric. Tongue protrusion is midline.  There is no pronator drift.  ROM of spine: The patient does have intact lumbar flexion and extension, she does have increased pain especially with extension of the lumbar spine.  Mild discomfort with left and right bend rotation.  The patient is nontender palpation over the vertebral bodies of the lumbar spine.  Nontender palpation along the left sacroiliac joint.  She does have moderate pain with palpation of the left sciatic notch at today's appointment.  Nontender palpation over the ischial tuberosity.  She does have mild tenderness to palpation along the left greater trochanteric bursa.  The patient does not have any pain with logroll maneuver of the left leg.  She does have some mild discomfort with FABER and FADIR testing involving  left hip but denies any catching or locking symptoms.  Strength:  Side Iliopsoas Quads Hamstring PF DF EHL  R 5 5 5 5 5 5   L 4 4 4 4 4 4    Reflexes are 1+ and symmetric at the patella and achilles.   Bilateral lower extremity sensation is intact to light touch.  Clonus is not present.  Toes are down-going.  Gait is normal.  Hoffman's is absent.  Negative bilateral straight leg raise.  Rapid alternating movements are normal.    Imaging: AP, lateral and oblique images of the lumbar spine were obtained previously in the office and reviewed by me today.  These x-rays do demonstrate moderate osteoarthritic changes especially at the L5-S1 level with loss of disc space and anterior osteophyte formation.  Moderate facet spondylosis noted throughout the lumbar spine.  Questionable grade 1 anterior spondylolisthesis of the L4 on L5 vertebral body.  No evidence of bony lesion.  No acute fractures identified.  AP and lateral views of the left femur were obtained previously in the office and reviewed by me today.  These x-rays do not demonstrate any evidence of acute fracture or dislocation.  Moderate arthritic changes noted to the left hip with bone spur formation of the superior aspect of the acetabulum and calcific changes involving the superior labrum.  Mild calcification noted to the medial  meniscus of the left knee without significant arthritic changes.  Impression: Lumbar radiculopathy [M54.16] Lumbar radiculopathy  (primary encounter diagnosis) Lumbar spondylosis Primary osteoarthritis of left hip Greater trochanteric bursitis of left hip  Plan:  1.  Treatment options were discussed today with the patient. 2.  The patient has been evaluated the past for ongoing left hip and leg pain.  At her last visit the burning and tingling that was initially present have resolved but she was still experiencing pain especially in the lateral aspect of the hip. 3.  Since her last evaluation the patient has  since developed increased discomfort and a sharp pain rating down the left leg.  She is experiencing burning to the left leg and she does have noticeable weakness of the left leg compared to the right at today's appointment. 4.  Previously given the lack of radicular symptoms I believe that her pain was likely stemming from her hip however with her recent injury and ongoing left leg pain I do believe the primary issue is underlying lumbar radiculopathy versus lumbar stenosis. 5.  The patient was given a prescription of tramadol  at today's visit.  She was given a refill of her Celebrex.  The patient was also offered and received a Toradol  injection at today's appointment. 6.  A stat MRI scan of the lumbar spine in addition to left hip was  placed in her chart.  Discussed that if she is only able to undergo 1 MRI at time would recommend proceeding with a lumbar spine MRI scan to determine if the patient may benefit from epidural steroid injection. 7.  She will follow-up with me after she has undergone her lumbar spine MRI scan to discuss possible referral to physiatry versus neurosurgery.  This office visit took 45 minutes, of which >50% involved patient counseling/education.  Review of the [place] CSRS was performed in accordance of the [facility] prior to dispensing any controlled drugs.  This note was generated in part with voice recognition software and I apologize for any typographical errors that were not detected and corrected.  DOROTHA Gustavo Level, PA-C, CAQ-OS [facility]

## 2023-07-31 ENCOUNTER — Other Ambulatory Visit: Payer: Self-pay | Admitting: Gastroenterology

## 2023-07-31 ENCOUNTER — Ambulatory Visit
Admission: RE | Admit: 2023-07-31 | Discharge: 2023-07-31 | Disposition: A | Discharge status: Home / Self Care | Source: Ambulatory Visit | Attending: Student | Admitting: Student

## 2023-07-31 ENCOUNTER — Ambulatory Visit
Admission: RE | Admit: 2023-07-31 | Discharge: 2023-07-31 | Disposition: A | Discharge status: Home / Self Care | Source: Ambulatory Visit | Attending: Student

## 2023-07-31 DIAGNOSIS — M4316 Spondylolisthesis, lumbar region: Secondary | ICD-10-CM | POA: Diagnosis not present

## 2023-07-31 DIAGNOSIS — M47816 Spondylosis without myelopathy or radiculopathy, lumbar region: Secondary | ICD-10-CM | POA: Diagnosis not present

## 2023-07-31 DIAGNOSIS — M160 Bilateral primary osteoarthritis of hip: Secondary | ICD-10-CM | POA: Diagnosis not present

## 2023-07-31 DIAGNOSIS — M5416 Radiculopathy, lumbar region: Secondary | ICD-10-CM | POA: Diagnosis not present

## 2023-07-31 DIAGNOSIS — M48061 Spinal stenosis, lumbar region without neurogenic claudication: Secondary | ICD-10-CM | POA: Diagnosis not present

## 2023-07-31 DIAGNOSIS — M51379 Other intervertebral disc degeneration, lumbosacral region without mention of lumbar back pain or lower extremity pain: Secondary | ICD-10-CM | POA: Diagnosis not present

## 2023-07-31 DIAGNOSIS — M948X5 Other specified disorders of cartilage, thigh: Secondary | ICD-10-CM | POA: Diagnosis not present

## 2023-08-23 DIAGNOSIS — M47816 Spondylosis without myelopathy or radiculopathy, lumbar region: Secondary | ICD-10-CM | POA: Diagnosis not present

## 2023-08-23 DIAGNOSIS — M5416 Radiculopathy, lumbar region: Secondary | ICD-10-CM | POA: Diagnosis not present

## 2023-08-23 DIAGNOSIS — M76892 Other specified enthesopathies of left lower limb, excluding foot: Secondary | ICD-10-CM | POA: Diagnosis not present

## 2023-08-23 DIAGNOSIS — M1612 Unilateral primary osteoarthritis, left hip: Secondary | ICD-10-CM | POA: Diagnosis not present

## 2023-09-06 DIAGNOSIS — M5442 Lumbago with sciatica, left side: Secondary | ICD-10-CM | POA: Diagnosis not present

## 2023-09-06 DIAGNOSIS — M5416 Radiculopathy, lumbar region: Secondary | ICD-10-CM | POA: Diagnosis not present

## 2023-09-20 ENCOUNTER — Encounter: Payer: Self-pay | Admitting: Internal Medicine

## 2023-09-22 NOTE — Telephone Encounter (Signed)
 Pt came in to pick up this afternoon

## 2023-09-22 NOTE — Telephone Encounter (Signed)
 Medication Samples have been provided to the patient.  Drug name: Ozempic        Strength: 0.25 mg        Qty: 1 box  LOT: [id number]  Exp.Date: 12/02/2025  Dosing instructions: Inject 2 mg as directed once a week.  The patient has been instructed regarding the correct time, dose, and frequency of taking this medication, including desired effects and most common side effects.   Emily Ryan 4:14 PM 09/22/2023

## 2023-09-22 NOTE — Telephone Encounter (Signed)
 Noted

## 2023-10-04 ENCOUNTER — Ambulatory Visit (INDEPENDENT_AMBULATORY_CARE_PROVIDER_SITE_OTHER)

## 2023-10-04 DIAGNOSIS — Z23 Encounter for immunization: Secondary | ICD-10-CM | POA: Diagnosis not present

## 2023-10-04 NOTE — Progress Notes (Signed)
 Patient was administered a high dose flu vaccine into her left deltoid. Patient tolerated the high dose flu vaccine well.

## 2023-10-09 ENCOUNTER — Other Ambulatory Visit: Payer: Self-pay | Admitting: Internal Medicine

## 2023-10-09 MED ORDER — PROMETHAZINE HCL 12.5 MG PO TABS: 12.5000 mg | ORAL_TABLET | Freq: Three times a day (TID) | ORAL | 0 refills | Status: AC | PRN

## 2023-10-09 MED ORDER — OSELTAMIVIR PHOSPHATE 75 MG PO CAPS: 75.0000 mg | ORAL_CAPSULE | Freq: Two times a day (BID) | ORAL | 0 refills | Status: AC

## 2023-11-07 ENCOUNTER — Encounter: Payer: Self-pay | Admitting: Internal Medicine

## 2023-11-08 MED ORDER — METFORMIN HCL ER 750 MG PO TB24: 1500.0000 mg | ORAL_TABLET | Freq: Every day | ORAL | 3 refills | Status: AC

## 2023-12-20 ENCOUNTER — Other Ambulatory Visit

## 2023-12-20 DIAGNOSIS — E1169 Type 2 diabetes mellitus with other specified complication: Secondary | ICD-10-CM

## 2023-12-20 DIAGNOSIS — Z7984 Long term (current) use of oral hypoglycemic drugs: Secondary | ICD-10-CM | POA: Diagnosis not present

## 2023-12-20 DIAGNOSIS — E782 Mixed hyperlipidemia: Secondary | ICD-10-CM

## 2023-12-20 DIAGNOSIS — E785 Hyperlipidemia, unspecified: Secondary | ICD-10-CM | POA: Diagnosis not present

## 2023-12-20 DIAGNOSIS — R5383 Other fatigue: Secondary | ICD-10-CM

## 2023-12-20 LAB — CBC WITH DIFFERENTIAL/PLATELET
Basophils Absolute: 0 K/uL (ref 0.0–0.1)
Basophils Relative: 0.5 % (ref 0.0–3.0)
Eosinophils Absolute: 0.1 K/uL (ref 0.0–0.7)
Eosinophils Relative: 2 % (ref 0.0–5.0)
HCT: 42.6 % (ref 36.0–46.0)
Hemoglobin: 14.5 g/dL (ref 12.0–15.0)
Lymphocytes Relative: 27.7 % (ref 12.0–46.0)
Lymphs Abs: 1.7 K/uL (ref 0.7–4.0)
MCHC: 34.1 g/dL (ref 30.0–36.0)
MCV: 98.3 fl (ref 78.0–100.0)
Monocytes Absolute: 0.8 K/uL (ref 0.1–1.0)
Monocytes Relative: 13.5 % — ABNORMAL HIGH (ref 3.0–12.0)
Neutro Abs: 3.4 K/uL (ref 1.4–7.7)
Neutrophils Relative %: 56.3 % (ref 43.0–77.0)
Platelets: 223 K/uL (ref 150.0–400.0)
RBC: 4.33 Mil/uL (ref 3.87–5.11)
RDW: 14.3 % (ref 11.5–15.5)
WBC: 6.1 K/uL (ref 4.0–10.5)

## 2023-12-20 LAB — LIPID PANEL
Cholesterol: 158 mg/dL (ref 28–200)
HDL: 61.3 mg/dL (ref >39.00)
LDL Cholesterol: 65 mg/dL (ref 10–99)
NonHDL: 96.82
Total CHOL/HDL Ratio: 3
Triglycerides: 160 mg/dL — ABNORMAL HIGH (ref 10.0–149.0)
VLDL: 32 mg/dL (ref 0.0–40.0)

## 2023-12-20 LAB — COMPREHENSIVE METABOLIC PANEL WITH GFR
ALT: 22 U/L (ref 3–35)
AST: 22 U/L (ref 5–37)
Albumin: 4.3 g/dL (ref 3.5–5.2)
Alkaline Phosphatase: 82 U/L (ref 39–117)
BUN: 13 mg/dL (ref 6–23)
CO2: 32 meq/L (ref 19–32)
Calcium: 9.2 mg/dL (ref 8.4–10.5)
Chloride: 101 meq/L (ref 96–112)
Creatinine, Ser: 0.56 mg/dL (ref 0.40–1.20)
GFR: 86.97 mL/min (ref >60.00)
Glucose, Bld: 123 mg/dL — ABNORMAL HIGH (ref 70–99)
Potassium: 4.6 meq/L (ref 3.5–5.1)
Sodium: 139 meq/L (ref 135–145)
Total Bilirubin: 0.7 mg/dL (ref 0.2–1.2)
Total Protein: 6.7 g/dL (ref 6.0–8.3)

## 2023-12-20 LAB — TSH: TSH: 2.18 u[IU]/mL (ref 0.35–5.50)

## 2023-12-20 LAB — HEMOGLOBIN A1C: Hgb A1c MFr Bld: 6.5 % (ref 4.6–6.5)

## 2023-12-20 LAB — LDL CHOLESTEROL, DIRECT: Direct LDL: 77 mg/dL

## 2023-12-21 ENCOUNTER — Ambulatory Visit: Payer: Self-pay | Admitting: Internal Medicine

## 2023-12-22 ENCOUNTER — Ambulatory Visit: Admitting: Internal Medicine

## 2023-12-22 ENCOUNTER — Other Ambulatory Visit

## 2023-12-22 ENCOUNTER — Encounter: Payer: Self-pay | Admitting: Internal Medicine

## 2023-12-22 VITALS — BP 126/70 | HR 89 | Ht 59.0 in | Wt 122.0 lb

## 2023-12-22 DIAGNOSIS — E1169 Type 2 diabetes mellitus with other specified complication: Secondary | ICD-10-CM

## 2023-12-22 DIAGNOSIS — Z7985 Long-term (current) use of injectable non-insulin antidiabetic drugs: Secondary | ICD-10-CM | POA: Diagnosis not present

## 2023-12-22 DIAGNOSIS — E785 Hyperlipidemia, unspecified: Secondary | ICD-10-CM | POA: Diagnosis not present

## 2023-12-22 DIAGNOSIS — I10 Essential (primary) hypertension: Secondary | ICD-10-CM

## 2023-12-22 DIAGNOSIS — E782 Mixed hyperlipidemia: Secondary | ICD-10-CM

## 2023-12-22 DIAGNOSIS — Z1231 Encounter for screening mammogram for malignant neoplasm of breast: Secondary | ICD-10-CM | POA: Diagnosis not present

## 2023-12-22 DIAGNOSIS — E119 Type 2 diabetes mellitus without complications: Secondary | ICD-10-CM

## 2023-12-22 MED ORDER — EMPAGLIFLOZIN 10 MG PO TABS: 10.0000 mg | ORAL_TABLET | Freq: Every day | ORAL | 3 refills | Status: DC

## 2023-12-22 MED ORDER — MONTELUKAST SODIUM 10 MG PO TABS: 10.0000 mg | ORAL_TABLET | Freq: Every day | ORAL | 3 refills | Status: AC

## 2023-12-22 NOTE — Patient Instructions (Addendum)
 YOUR MAMMOGRAM IS DUE in January, PLEASE CALL AND GET THIS SCHEDULED! [facility] - call [phone]     Take [facility] sinus rinse kit  with you to [place] and FLUSH your sinuses when you get to your hotel.   May the Lord give you peace and joy during this holiday season, and may the promise of His return bring you comfort and hope for the future.  Regards,   Verneita Kettering, MD

## 2023-12-22 NOTE — Progress Notes (Unsigned)
 Patient ID: Emily Ryan, female    DOB: November 02, 1945  Age: 78 y.o. MRN: [id number]  The patient is here for follow up and  management of other chronic and acute problems.   The risk factors are reflected in the social history.   The roster of all physicians providing medical care to patient - is listed in the Snapshot section of the chart.   Activities of daily living:  The patient is 100% independent in all ADLs: dressing, toileting, feeding as well as independent mobility   Home safety : The patient has smoke detectors in the home. They wear seatbelts.  There are no unsecured firearms at home. There is no violence in the home.    There is no risks for hepatitis, STDs or HIV. There is no   history of blood transfusion. They have no travel history to infectious disease endemic areas of the world.   The patient has seen their dentist in the last six month. They have seen their eye doctor in the last year. The patinet  denies slight hearing difficulty with regard to whispered voices and some television programs.  They have deferred audiologic testing in the last year.  They do not  have excessive sun exposure. Discussed the need for sun protection: hats, long sleeves and use of sunscreen if there is significant sun exposure.    Diet: the importance of a healthy diet is discussed. They do have a healthy diet.   The benefits of regular aerobic exercise were discussed. The patient  exercises  3 to 5 days per week  for  60 minutes.    Depression screen: there are no signs or vegative symptoms of depression- irritability, change in appetite, anhedonia, sadness/tearfullness.   The following portions of the patient's history were reviewed and updated as appropriate: allergies, current medications, past family history, past medical history,  past surgical history, past social history  and problem list.   Visual acuity was not assessed per patient preference since the patient has regular follow up  with an  ophthalmologist. Hearing and body mass index were assessed and reviewed.    During the course of the visit the patient was educated and counseled about appropriate screening and preventive services including : fall prevention , diabetes screening, nutrition counseling, colorectal cancer screening, and recommended immunizations.    Chief Complaint:   no issues.     T2DM:  She  feels generally well,  is exercising regularly and maintaining  her current weight. Checking  blood sugars less than once daily at variable times, usually only if she feels she may be having a hypoglycemic event. .  BS have been under 130 fasting and < 150 post prandially.  Denies any recent hypoglyemic events.  Taking   medications as directed. Following a carbohydrate modified diet 6 days per week. Denies numbness, burning and tingling of extremities. Appetite is good.     Review of Symptoms  Patient denies headache, fevers, malaise, unintentional weight loss, skin rash, eye pain, sinus congestion and sinus pain, sore throat, dysphagia,  hemoptysis , cough, dyspnea, wheezing, chest pain, palpitations, orthopnea, edema, abdominal pain, nausea, melena, diarrhea, constipation, flank pain, dysuria, hematuria, urinary  Frequency, nocturia, numbness, tingling, seizures,  Focal weakness, Loss of consciousness,  Tremor, insomnia, depression, anxiety, and suicidal ideation.    Physical Exam:  BP 126/70   Pulse 89   Ht 4' 11 (1.499 m)   Wt 122 lb (55.3 kg)   SpO2 96%   BMI 24.64  kg/m    Physical Exam Vitals reviewed.  Constitutional:      General: She is not in acute distress.    Appearance: Normal appearance. She is normal weight. She is not ill-appearing, toxic-appearing or diaphoretic.  HENT:     Head: Normocephalic.  Eyes:     General: No scleral icterus.       Right eye: No discharge.        Left eye: No discharge.     Conjunctiva/sclera: Conjunctivae normal.  Cardiovascular:     Rate and Rhythm:  Normal rate and regular rhythm.     Heart sounds: Normal heart sounds.  Pulmonary:     Effort: Pulmonary effort is normal. No respiratory distress.     Breath sounds: Normal breath sounds.  Musculoskeletal:        General: Normal range of motion.  Skin:    General: Skin is warm and dry.  Neurological:     General: No focal deficit present.     Mental Status: She is alert and oriented to person, place, and time. Mental status is at baseline.  Psychiatric:        Mood and Affect: Mood normal.        Behavior: Behavior normal.        Thought Content: Thought content normal.        Judgment: Judgment normal.     Assessment and Plan: Encounter for screening mammogram for malignant neoplasm of breast -     3D Screening Mammogram, Left and Right; Future  Hyperlipidemia associated with type 2 diabetes mellitus (HCC) -     Lipid panel; Future -     LDL cholesterol, direct; Future  DM type 2 with diabetic mixed hyperlipidemia (HCC) -     Comprehensive metabolic panel with GFR; Future -     Hemoglobin A1c; Future -     Microalbumin / creatinine urine ratio; Future  White coat syndrome with diagnosis of hypertension Assessment & Plan: Managed with telmisartan  alone after suspending amlodipine  and hctz for symptomatic hypotension    Diabetes mellitus treated with injections of non-insulin  medication (HCC) Assessment & Plan: Currently well-controlled on current medications .  hemoglobin A1c  remains  at goal of less than 7.0 .  Continue Ozempic    Lab Results  Component Value Date   HGBA1C 6.5 12/20/2023    Lab Results  Component Value Date   MICROALBUR 1.3 06/21/2023        Hyperlipidemia LDL goal <70 Assessment & Plan: LDL is at goal on rosuvastatin  . She has no side effects and ALT  is normal.   No changes today. Continue aspirin  low dose.  Lab Results  Component Value Date   CHOL 158 12/20/2023   HDL 61.30 12/20/2023   LDLCALC 65 12/20/2023   LDLDIRECT 77.0  12/20/2023   TRIG 160.0 (H) 12/20/2023   CHOLHDL 3 12/20/2023   Lab Results  Component Value Date   ALT 22 12/20/2023   AST 22 12/20/2023   ALKPHOS 82 12/20/2023   BILITOT 0.7 12/20/2023       Other orders -     Empagliflozin ; Take 1 tablet (10 mg total) by mouth daily before breakfast.  Dispense: 90 tablet; Refill: 3 -     Montelukast  Sodium; Take 1 tablet (10 mg total) by mouth at bedtime.  Dispense: 90 tablet; Refill: 3    Return in about 6 months (around 06/21/2024).  Verneita LITTIE Kettering, MD

## 2023-12-24 NOTE — Assessment & Plan Note (Signed)
 LDL is at goal on rosuvastatin  . She has no side effects and ALT  is normal.   No changes today. Continue aspirin  low dose.  Lab Results  Component Value Date   CHOL 158 12/20/2023   HDL 61.30 12/20/2023   LDLCALC 65 12/20/2023   LDLDIRECT 77.0 12/20/2023   TRIG 160.0 (H) 12/20/2023   CHOLHDL 3 12/20/2023   Lab Results  Component Value Date   ALT 22 12/20/2023   AST 22 12/20/2023   ALKPHOS 82 12/20/2023   BILITOT 0.7 12/20/2023

## 2023-12-24 NOTE — Assessment & Plan Note (Signed)
 Managed with telmisartan  alone after suspending amlodipine  and hctz for symptomatic hypotension

## 2023-12-24 NOTE — Assessment & Plan Note (Signed)
 Currently well-controlled on current medications .  hemoglobin A1c  remains  at goal of less than 7.0 .  Continue Ozempic    Lab Results  Component Value Date   HGBA1C 6.5 12/20/2023    Lab Results  Component Value Date   MICROALBUR 1.3 06/21/2023

## 2023-12-26 ENCOUNTER — Encounter: Admitting: Internal Medicine

## 2024-01-15 ENCOUNTER — Encounter: Payer: Self-pay | Admitting: Internal Medicine

## 2024-01-16 MED ORDER — EMPAGLIFLOZIN 10 MG PO TABS: 10.0000 mg | ORAL_TABLET | Freq: Every day | ORAL | 3 refills | Status: AC

## 2024-01-24 ENCOUNTER — Ambulatory Visit
Admission: RE | Admit: 2024-01-24 | Discharge: 2024-01-24 | Disposition: A | Discharge status: Home / Self Care | Source: Ambulatory Visit | Attending: Internal Medicine | Admitting: Internal Medicine

## 2024-01-24 DIAGNOSIS — Z1231 Encounter for screening mammogram for malignant neoplasm of breast: Secondary | ICD-10-CM | POA: Insufficient documentation

## 2024-02-07 LAB — OPHTHALMOLOGY REPORT-SCANNED

## 2024-02-08 ENCOUNTER — Encounter: Payer: Self-pay | Admitting: Internal Medicine

## 2024-02-08 ENCOUNTER — Ambulatory Visit: Admitting: Internal Medicine

## 2024-02-08 VITALS — BP 122/65 | HR 94 | Temp 97.5°F | Ht 59.0 in | Wt 122.2 lb

## 2024-02-08 DIAGNOSIS — R300 Dysuria: Secondary | ICD-10-CM

## 2024-02-08 DIAGNOSIS — E1169 Type 2 diabetes mellitus with other specified complication: Secondary | ICD-10-CM

## 2024-02-08 LAB — URINALYSIS, MICROSCOPIC ONLY

## 2024-02-08 LAB — POCT URINE DIPSTICK
Glucose, UA: >=1000 mg/dL — AB
Leukocytes, UA: NEGATIVE
Nitrite, UA: POSITIVE — AB
POC PROTEIN,UA: 30 — AB
Spec Grav, UA: <=1.005 — AB
Urobilinogen, UA: 1 U/dL
pH, UA: 5

## 2024-02-08 MED ORDER — ESTRADIOL 0.01 % VA CREA: TOPICAL_CREAM | VAGINAL | 12 refills | Status: AC

## 2024-02-08 NOTE — Progress Notes (Unsigned)
 "  Subjective:  Patient ID: Emily Ryan, female    DOB: 1945/12/13  Age: 79 y.o. MRN: [id number]  CC: The primary encounter diagnosis was Dysuria. A diagnosis of DM type 2 with diabetic mixed hyperlipidemia (HCC) was also pertinent to this visit.   HPI Emily Ryan presents for  Chief Complaint  Patient presents with   Acute Visit    Possible UTI per patient    Emily Ryan is a 79 yr old woman with hypertension , type 2 DM, atrophic vaginitis (not using topical estrogen currently)  who presents with a  1  day history of burning with urination, urgency with hesitancy , leakage.   No hot tub or swimming.  She did recently have intercourse with husband  and notes that after intercourse, which is infrequent.  she has vaginal irritation for a day or two afterward   She has not used topical estrogen in over 6 months   Outpatient Medications Prior to Visit  Medication Sig Dispense Refill   amLODipine  (NORVASC ) 2.5 MG tablet Take 1 tablet (2.5 mg total) by mouth daily. 90 tablet 3   Ascorbic Acid (VITAMIN C) 1000 MG tablet Take 1,000 mg by mouth daily.      aspirin  EC 81 MG tablet Take 81 mg by mouth at bedtime. Swallow whole.     azithromycin  (ZITHROMAX ) 500 MG tablet Take 1 tablet (500 mg total) by mouth daily. For respiratory infection (Patient not taking: Reported on 12/22/2023) 7 tablet 0   Calcium  Carbonate-Vitamin D3 600-400 MG-UNIT TABS Take 1 tablet by mouth daily.     celecoxib (CELEBREX) 200 MG capsule Take 200 mg by mouth 2 (two) times daily. (Patient not taking: Reported on 12/22/2023)     clobetasol cream (TEMOVATE) 0.05 % Apply 1 application topically 2 (two) times daily as needed (psoriasis).     doxycycline  (VIBRA -TABS) 100 MG tablet Take 1 tablet (100 mg total) by mouth 2 (two) times daily. (Patient not taking: Reported on 12/22/2023) 14 tablet 0   empagliflozin  (JARDIANCE ) 10 MG TABS tablet Take 1 tablet (10 mg total) by mouth daily before breakfast. 90 tablet 3    escitalopram  (LEXAPRO ) 5 MG tablet Take 1 tablet (5 mg total) by mouth every evening. 90 tablet 3   estradiol  (ESTRACE ) 0.1 MG/GM vaginal cream APPLY 1 GRAM INTRAVAGINALLY AND TO PERINEUM NIGHTLY X 14 DAYS,  THEN TWICE WEEKLY THEREAFTER 42.5 g 12   folic acid  (FOLVITE ) 1 MG tablet Take 1 tablet (1 mg total) by mouth See admin instructions. Takes daily except on Saturday 90 tablet 3   glucose blood (FREESTYLE LITE) test strip Use as instructed to test blood sugar once daily 100 each 3   Infant Care Products (DERMACLOUD) OINT APPLY TO PERINEUM AFTER ESTROGEN CREAM . OK TO REAPPLY AS NEEDED 430 g 2   loratadine  (CLARITIN ) 10 MG tablet Take 1 tablet (10 mg total) by mouth daily. 90 tablet 3   metFORMIN  (GLUCOPHAGE  XR) 750 MG 24 hr tablet Take 2 tablets (1,500 mg total) by mouth daily with breakfast. 180 tablet 3   methotrexate  (RHEUMATREX) 2.5 MG tablet Take 3 tablets (7.5 mg total) by mouth once a week. Caution:Chemotherapy. Protect from light. 36 tablet 1   montelukast  (SINGULAIR ) 10 MG tablet Take 1 tablet (10 mg total) by mouth at bedtime. 90 tablet 3   Multiple Vitamin (MULTIVITAMIN) capsule Take 1 capsule by mouth daily.     omeprazole  (PRILOSEC) 20 MG capsule Take 1 capsule (20 mg total) by mouth  daily. 90 capsule 3   oseltamivir  (TAMIFLU ) 75 MG capsule Take 1 capsule (75 mg total) by mouth 2 (two) times daily. (Patient not taking: Reported on 12/22/2023) 10 capsule 0   Precision Thin Lancets MISC Use as directed to check sugars twice daily 180 each 3   promethazine  (PHENERGAN ) 12.5 MG tablet Take 1 tablet (12.5 mg total) by mouth every 8 (eight) hours as needed for nausea or vomiting. (Patient not taking: Reported on 12/22/2023) 20 tablet 0   rosuvastatin  (CRESTOR ) 10 MG tablet Take 1 tablet (10 mg total) by mouth daily. 90 tablet 3   Semaglutide , 2 MG/DOSE, 8 MG/3ML SOPN Inject 2 mg as directed once a week. 3 mL 11   telmisartan  (MICARDIS ) 80 MG tablet Take 1 tablet (80 mg total) by mouth daily.  90 tablet 3   vitamin E 400 UNIT capsule Take 400 Units by mouth daily.     No facility-administered medications prior to visit.    Review of Systems;  Patient denies headache, fevers, malaise, unintentional weight loss, skin rash, eye pain, sinus congestion and sinus pain, sore throat, dysphagia,  hemoptysis , cough, dyspnea, wheezing, chest pain, palpitations, orthopnea, edema, abdominal pain, nausea, melena, diarrhea, constipation, flank pain, dysuria, hematuria, urinary  Frequency, nocturia, numbness, tingling, seizures,  Focal weakness, Loss of consciousness,  Tremor, insomnia, depression, anxiety, and suicidal ideation.      Objective:  There were no vitals taken for this visit.  BP Readings from Last 3 Encounters:  12/22/23 126/70  06/23/23 130/74  12/21/22 130/68    Wt Readings from Last 3 Encounters:  12/22/23 122 lb (55.3 kg)  06/23/23 122 lb 9.6 oz (55.6 kg)  12/21/22 121 lb 6.4 oz (55.1 kg)    Physical Exam  Lab Results  Component Value Date   HGBA1C 6.5 12/20/2023   HGBA1C 6.3 06/21/2023   HGBA1C 6.2 12/16/2022    Lab Results  Component Value Date   CREATININE 0.56 12/20/2023   CREATININE 0.58 06/21/2023   CREATININE 0.64 12/16/2022    Lab Results  Component Value Date   WBC 6.1 12/20/2023   HGB 14.5 12/20/2023   HCT 42.6 12/20/2023   PLT 223.0 12/20/2023   GLUCOSE 123 (H) 12/20/2023   CHOL 158 12/20/2023   TRIG 160.0 (H) 12/20/2023   HDL 61.30 12/20/2023   LDLDIRECT 77.0 12/20/2023   LDLCALC 65 12/20/2023   ALT 22 12/20/2023   AST 22 12/20/2023   NA 139 12/20/2023   K 4.6 12/20/2023   CL 101 12/20/2023   CREATININE 0.56 12/20/2023   BUN 13 12/20/2023   CO2 32 12/20/2023   TSH 2.18 12/20/2023   HGBA1C 6.5 12/20/2023   MICROALBUR 1.3 06/21/2023    MM 3D SCREENING MAMMOGRAM BILATERAL BREAST Result Date: 01/25/2024 CLINICAL DATA:  Screening. EXAM: DIGITAL SCREENING BILATERAL MAMMOGRAM WITH TOMOSYNTHESIS AND CAD TECHNIQUE: Bilateral  screening digital craniocaudal and mediolateral oblique mammograms were obtained. Bilateral screening digital breast tomosynthesis was performed. The images were evaluated with computer-aided detection. COMPARISON:  Previous exam(s). ACR Breast Density Category c: The breasts are heterogeneously dense, which may obscure small masses. FINDINGS: There are no findings suspicious for malignancy. IMPRESSION: No mammographic evidence of malignancy. A result letter of this screening mammogram will be mailed directly to the patient. RECOMMENDATION: Screening mammogram in one year. (Code:SM-B-01Y) BI-RADS CATEGORY  1: Negative. Electronically Signed   By: Alm Parkins M.D.   On: 01/25/2024 14:54    Assessment & Plan:   Problem List Items Addressed This Visit  DM type 2 with diabetic mixed hyperlipidemia (HCC)   Other Visit Diagnoses       Dysuria    -  Primary          I spent 34 minutes on the day of this face to face encounter reviewing patient's  most recent visit with cardiology,  nephrology,  and neurology,  prior relevant surgical and non surgical procedures, recent  labs and imaging studies, counseling on weight management,  reviewing the assessment and plan with patient, and post visit ordering and reviewing of  diagnostics and therapeutics with patient  .   Follow-up: No follow-ups on file.   Verneita LITTIE Kettering, MD "

## 2024-02-08 NOTE — Patient Instructions (Signed)
 Your dipstick urinalysis  is abnormal and may represent infection.  I am waiting for the results of your microscopic analysis and culture to decide on treatment   Please start back on the estrogen cream nightly for 2 weeks ,  apply a pea sized amount to your urethra and to the opening of your vagina   I have prescribed a 6 day prednisone  taper FOR Emily Ryan .  If the hoarseness doesn't resolve after this,  I will make an ENT referral

## 2024-02-08 NOTE — Telephone Encounter (Signed)
 Spoke with pt's husband and scheduled pt for an appt today.

## 2024-02-09 ENCOUNTER — Encounter: Payer: Self-pay | Admitting: Internal Medicine

## 2024-02-09 ENCOUNTER — Other Ambulatory Visit: Payer: Self-pay | Admitting: Internal Medicine

## 2024-02-09 DIAGNOSIS — R300 Dysuria: Secondary | ICD-10-CM

## 2024-02-09 MED ORDER — CIPROFLOXACIN HCL 250 MG PO TABS: 250.0000 mg | ORAL_TABLET | Freq: Two times a day (BID) | ORAL | 0 refills | Status: AC

## 2024-02-09 NOTE — Assessment & Plan Note (Signed)
 Ddx includes urethral irritation from intercourse in the setting of atrophic vaginitis,  vs UTI.  Symptoms are mild.  She is comfortable waiting for the results of urine micro with culture .  Advised to resume use of topical estrogen around the urehtra and introitus.

## 2024-02-09 NOTE — Assessment & Plan Note (Signed)
 Symptoms worsening.  Micro notes pyuria , hematuria and bacteruiria.  Sending empiric abx coverage (cipro )

## 2024-06-25 ENCOUNTER — Other Ambulatory Visit

## 2024-06-27 ENCOUNTER — Ambulatory Visit: Admitting: Internal Medicine
